# Patient Record
Sex: Female | Born: 1955 | Race: White | Hispanic: No | Smoking: Current every day smoker
Health system: Southern US, Community
[De-identification: ages and names within clinical notes are randomized; demographics above are authoritative.]

## PROBLEM LIST (undated history)

## (undated) DIAGNOSIS — K219 Gastro-esophageal reflux disease without esophagitis: Secondary | ICD-10-CM

## (undated) DIAGNOSIS — I10 Essential (primary) hypertension: Secondary | ICD-10-CM

## (undated) DIAGNOSIS — F32A Depression, unspecified: Secondary | ICD-10-CM

## (undated) DIAGNOSIS — M069 Rheumatoid arthritis, unspecified: Secondary | ICD-10-CM

## (undated) DIAGNOSIS — F329 Major depressive disorder, single episode, unspecified: Secondary | ICD-10-CM

## (undated) DIAGNOSIS — Z8619 Personal history of other infectious and parasitic diseases: Secondary | ICD-10-CM

## (undated) DIAGNOSIS — M1990 Unspecified osteoarthritis, unspecified site: Secondary | ICD-10-CM

## (undated) DIAGNOSIS — K7460 Unspecified cirrhosis of liver: Secondary | ICD-10-CM

## (undated) HISTORY — DX: Unspecified cirrhosis of liver: K74.60

## (undated) HISTORY — PX: REDUCTION MAMMAPLASTY: SUR839

## (undated) HISTORY — PX: BREAST REDUCTION SURGERY: SHX8

## (undated) HISTORY — DX: Personal history of other infectious and parasitic diseases: Z86.19

---

## 2003-12-11 ENCOUNTER — Emergency Department (HOSPITAL_COMMUNITY): Admission: EM | Admit: 2003-12-11 | Discharge: 2003-12-11 | Payer: Self-pay | Admitting: *Deleted

## 2003-12-12 ENCOUNTER — Emergency Department (HOSPITAL_COMMUNITY): Admission: EM | Admit: 2003-12-12 | Discharge: 2003-12-12 | Payer: Self-pay | Admitting: Emergency Medicine

## 2006-07-04 ENCOUNTER — Emergency Department (HOSPITAL_COMMUNITY): Admission: EM | Admit: 2006-07-04 | Discharge: 2006-07-04 | Payer: Self-pay | Admitting: *Deleted

## 2010-02-02 ENCOUNTER — Encounter (INDEPENDENT_AMBULATORY_CARE_PROVIDER_SITE_OTHER): Payer: Self-pay | Admitting: Family Medicine

## 2010-02-02 ENCOUNTER — Ambulatory Visit: Payer: Self-pay | Admitting: Internal Medicine

## 2010-02-02 LAB — CONVERTED CEMR LAB
Albumin: 4 g/dL (ref 3.5–5.2)
Anti Nuclear Antibody(ANA): NEGATIVE
BUN: 15 mg/dL (ref 6–23)
Basophils Relative: 0 % (ref 0–1)
Calcium: 8.7 mg/dL (ref 8.4–10.5)
Chloride: 105 meq/L (ref 96–112)
Creatinine, Ser: 0.91 mg/dL (ref 0.40–1.20)
Eosinophils Absolute: 0.2 10*3/uL (ref 0.0–0.7)
Eosinophils Relative: 3 % (ref 0–5)
Glucose, Bld: 112 mg/dL — ABNORMAL HIGH (ref 70–99)
HCT: 38 % (ref 36.0–46.0)
Lymphs Abs: 1.5 10*3/uL (ref 0.7–4.0)
MCHC: 31.3 g/dL (ref 30.0–36.0)
MCV: 88.6 fL (ref 78.0–100.0)
Monocytes Relative: 9 % (ref 3–12)
Neutrophils Relative %: 67 % (ref 43–77)
Platelets: 298 10*3/uL (ref 150–400)
Potassium: 4.3 meq/L (ref 3.5–5.3)
RBC: 4.29 M/uL (ref 3.87–5.11)
Rhuematoid fact SerPl-aCnc: 46 intl units/mL — ABNORMAL HIGH (ref 0–20)
TSH: 1.295 microintl units/mL (ref 0.350–4.500)
WBC: 7.3 10*3/uL (ref 4.0–10.5)

## 2010-02-23 ENCOUNTER — Ambulatory Visit: Payer: Self-pay | Admitting: Internal Medicine

## 2010-02-25 ENCOUNTER — Encounter (INDEPENDENT_AMBULATORY_CARE_PROVIDER_SITE_OTHER): Payer: Self-pay | Admitting: Family Medicine

## 2010-02-25 ENCOUNTER — Ambulatory Visit: Payer: Self-pay | Admitting: Internal Medicine

## 2010-02-25 LAB — CONVERTED CEMR LAB
BUN: 16 mg/dL (ref 6–23)
Basophils Absolute: 0 10*3/uL (ref 0.0–0.1)
Cholesterol: 160 mg/dL (ref 0–200)
Eosinophils Relative: 3 % (ref 0–5)
HCT: 39 % (ref 36.0–46.0)
HDL: 40 mg/dL (ref >39)
Lymphocytes Relative: 18 % (ref 12–46)
Neutro Abs: 6.4 10*3/uL (ref 1.7–7.7)
Neutrophils Relative %: 74 % (ref 43–77)
Platelets: 253 10*3/uL (ref 150–400)
Potassium: 4.6 meq/L (ref 3.5–5.3)
RDW: 15.5 % (ref 11.5–15.5)
Triglycerides: 193 mg/dL — ABNORMAL HIGH (ref <150)
VLDL: 39 mg/dL (ref 0–40)
WBC: 8.7 10*3/uL (ref 4.0–10.5)

## 2010-03-04 ENCOUNTER — Ambulatory Visit: Payer: Self-pay | Admitting: Internal Medicine

## 2010-04-03 ENCOUNTER — Ambulatory Visit: Payer: Self-pay | Admitting: Internal Medicine

## 2010-05-07 ENCOUNTER — Encounter (INDEPENDENT_AMBULATORY_CARE_PROVIDER_SITE_OTHER): Payer: Self-pay | Admitting: Family Medicine

## 2010-05-07 ENCOUNTER — Ambulatory Visit: Payer: Self-pay | Admitting: Internal Medicine

## 2010-05-07 LAB — CONVERTED CEMR LAB
Basophils Absolute: 0 10*3/uL (ref 0.0–0.1)
Basophils Relative: 0 % (ref 0–1)
Eosinophils Relative: 2 % (ref 0–5)
HCT: 33 % — ABNORMAL LOW (ref 36.0–46.0)
Hemoglobin: 10.4 g/dL — ABNORMAL LOW (ref 12.0–15.0)
MCHC: 31.5 g/dL (ref 30.0–36.0)
Monocytes Absolute: 0.7 10*3/uL (ref 0.1–1.0)
Neutro Abs: 6.9 10*3/uL (ref 1.7–7.7)
RDW: 15.3 % (ref 11.5–15.5)

## 2010-05-08 ENCOUNTER — Encounter (INDEPENDENT_AMBULATORY_CARE_PROVIDER_SITE_OTHER): Payer: Self-pay | Admitting: Family Medicine

## 2010-06-25 ENCOUNTER — Ambulatory Visit: Payer: Self-pay | Admitting: Internal Medicine

## 2010-07-30 ENCOUNTER — Encounter (INDEPENDENT_AMBULATORY_CARE_PROVIDER_SITE_OTHER): Payer: Self-pay | Admitting: Family Medicine

## 2010-07-30 LAB — CONVERTED CEMR LAB
Basophils Absolute: 0 10*3/uL (ref 0.0–0.1)
Eosinophils Absolute: 0.2 10*3/uL (ref 0.0–0.7)
Eosinophils Relative: 2 % (ref 0–5)
HCT: 36.5 % (ref 36.0–46.0)
MCV: 85.9 fL (ref 78.0–100.0)
Platelets: 360 10*3/uL (ref 150–400)
RDW: 15.2 % (ref 11.5–15.5)

## 2010-08-11 ENCOUNTER — Ambulatory Visit (HOSPITAL_COMMUNITY): Admission: RE | Admit: 2010-08-11 | Discharge: 2010-08-11 | Payer: Self-pay | Admitting: Family Medicine

## 2010-12-23 ENCOUNTER — Other Ambulatory Visit (HOSPITAL_COMMUNITY): Payer: Self-pay | Admitting: Family Medicine

## 2010-12-23 DIAGNOSIS — Z92241 Personal history of systemic steroid therapy: Secondary | ICD-10-CM

## 2010-12-31 ENCOUNTER — Ambulatory Visit (HOSPITAL_COMMUNITY)
Admission: RE | Admit: 2010-12-31 | Discharge: 2010-12-31 | Disposition: A | Discharge status: Home / Self Care | Payer: Self-pay | Source: Ambulatory Visit | Attending: Family Medicine | Admitting: Family Medicine

## 2010-12-31 DIAGNOSIS — Z13820 Encounter for screening for osteoporosis: Secondary | ICD-10-CM | POA: Insufficient documentation

## 2010-12-31 DIAGNOSIS — Z0000 Encounter for general adult medical examination without abnormal findings: Secondary | ICD-10-CM | POA: Insufficient documentation

## 2010-12-31 DIAGNOSIS — Z92241 Personal history of systemic steroid therapy: Secondary | ICD-10-CM

## 2011-05-18 ENCOUNTER — Other Ambulatory Visit (HOSPITAL_COMMUNITY): Payer: Self-pay | Admitting: Family Medicine

## 2011-05-18 ENCOUNTER — Ambulatory Visit (HOSPITAL_COMMUNITY)
Admission: RE | Admit: 2011-05-18 | Discharge: 2011-05-18 | Disposition: A | Discharge status: Home / Self Care | Payer: Self-pay | Source: Ambulatory Visit | Attending: Family Medicine | Admitting: Family Medicine

## 2011-05-18 DIAGNOSIS — F17200 Nicotine dependence, unspecified, uncomplicated: Secondary | ICD-10-CM | POA: Insufficient documentation

## 2011-05-18 DIAGNOSIS — I10 Essential (primary) hypertension: Secondary | ICD-10-CM | POA: Insufficient documentation

## 2011-05-18 DIAGNOSIS — R0602 Shortness of breath: Secondary | ICD-10-CM | POA: Insufficient documentation

## 2011-05-18 DIAGNOSIS — R52 Pain, unspecified: Secondary | ICD-10-CM

## 2012-07-19 ENCOUNTER — Encounter (HOSPITAL_COMMUNITY): Payer: Self-pay

## 2012-07-19 ENCOUNTER — Emergency Department (INDEPENDENT_AMBULATORY_CARE_PROVIDER_SITE_OTHER)
Admission: EM | Admit: 2012-07-19 | Discharge: 2012-07-19 | Disposition: A | Discharge status: Home / Self Care | Payer: Medicare Other | Source: Home / Self Care | Attending: Family Medicine | Admitting: Family Medicine

## 2012-07-19 DIAGNOSIS — G8929 Other chronic pain: Secondary | ICD-10-CM

## 2012-07-19 HISTORY — DX: Major depressive disorder, single episode, unspecified: F32.9

## 2012-07-19 HISTORY — DX: Unspecified osteoarthritis, unspecified site: M19.90

## 2012-07-19 HISTORY — DX: Essential (primary) hypertension: I10

## 2012-07-19 HISTORY — DX: Depression, unspecified: F32.A

## 2012-07-19 HISTORY — DX: Gastro-esophageal reflux disease without esophagitis: K21.9

## 2012-07-19 MED ORDER — TRAMADOL HCL 50 MG PO TABS: 50.0000 mg | ORAL_TABLET | Freq: Three times a day (TID) | ORAL | Status: DC | PRN

## 2012-07-19 NOTE — ED Provider Notes (Signed)
History     CSN: [id number]  Arrival date & time 07/19/12  1209   First MD Initiated Contact with Patient 07/19/12 1210      Chief Complaint  Patient presents with  . Joint Pain    (Consider location/radiation/quality/duration/timing/severity/associated sxs/prior treatment) HPI Comments: 56 year old female with history of hypertension and chronic pain related to arthritis. Here requesting refills on her chronic pain medications. She was a [facility] patient and is used to take Percocet as needed for pain. She also takes methotrexate for her rheumatoid arthritis. She explains to me she got all her medications refilled except Percocet. Has been experiencing usual arthritis pain in her left elbow. States that her pain usually involves both elbows and knees. Denies current swelling or redness. Denies general malaise, fever or chills. She has an appointment with a new provider in [facility] on October 10th and will like pain medications to last until next appointment. Does not take anti-inflammatory medications as per her report NSAID cause her to have a rash.   Past Medical History  Diagnosis Date  . Hypertension   . Arthritis   . GERD (gastroesophageal reflux disease)   . Depression     History reviewed. No pertinent past surgical history.  No family history on file.  History  Substance Use Topics  . Smoking status: Current Every Day Smoker  . Smokeless tobacco: Not on file  . Alcohol Use: Yes    OB History    Grav Para Term Preterm Abortions TAB SAB Ect Mult Living                  Review of Systems  Constitutional: Negative for fever, chills, activity change, appetite change and fatigue.       10 systems reviewed and  pertinent negative and positive symptoms are as per HPI.     Musculoskeletal: Positive for arthralgias. Negative for back pain, joint swelling and gait problem.  Skin: Negative for rash.  All other systems reviewed and are  negative.    Allergies  Review of patient's allergies indicates no known allergies.  Home Medications   Current Outpatient Rx  Name Route Sig Dispense Refill  . ESCITALOPRAM OXALATE PO Oral Take by mouth 2 (two) times daily.    [name] LISINOPRIL PO Oral Take by mouth daily.    [name] METHOTREXATE PO Oral Take by mouth once a week.    . OXYCODONE HCL PO Oral Take by mouth.    [name] PROTONIX PO Oral Take by mouth.    . TRAMADOL HCL 50 MG PO TABS Oral Take 1 tablet (50 mg total) by mouth every 8 (eight) hours as needed for pain. 30 tablet 0    BP 148/86  Pulse 66  Temp 97.9 F (36.6 C) (Oral)  Resp 16  SpO2 98%  Physical Exam  Nursing note and vitals reviewed. Constitutional: She is oriented to person, place, and time. She appears well-developed and well-nourished. No distress.  HENT:  Head: Normocephalic and atraumatic.  Mouth/Throat: Oropharynx is clear and moist.  Eyes: Conjunctivae normal are normal. No scleral icterus.  Neck: Neck supple.  Cardiovascular: Normal heart sounds.   Pulmonary/Chest: Breath sounds normal.  Musculoskeletal:       Left elbow: No swelling or erythema. Diffuse tenderness to palpation. Full range of motion actively and passively.  Lymphadenopathy:    She has no cervical adenopathy.  Neurological: She is alert and oriented to person, place, and time.  Skin: No rash noted.    ED  Course  Procedures (including critical care time)  Labs Reviewed - No data to display No results found.   1. Chronic pain       MDM  Clinically well. Prescribed tramadol #30 tablets to take 3 times a day when necessary until her next appointment.   Sharin Grave, MD 07/20/12 1630

## 2012-07-19 NOTE — ED Notes (Signed)
C/o pain in lt elbow for last several days.  Denies injury, states she has arthritis and is out of her percocet- [facility] pt.  Has been applying heat.

## 2012-07-21 ENCOUNTER — Encounter (HOSPITAL_COMMUNITY): Payer: Self-pay

## 2012-07-21 ENCOUNTER — Emergency Department (HOSPITAL_COMMUNITY)
Admission: EM | Admit: 2012-07-21 | Discharge: 2012-07-21 | Disposition: A | Discharge status: Home / Self Care | Payer: Medicare Other | Attending: Emergency Medicine | Admitting: Emergency Medicine

## 2012-07-21 DIAGNOSIS — F17200 Nicotine dependence, unspecified, uncomplicated: Secondary | ICD-10-CM | POA: Insufficient documentation

## 2012-07-21 DIAGNOSIS — F329 Major depressive disorder, single episode, unspecified: Secondary | ICD-10-CM | POA: Insufficient documentation

## 2012-07-21 DIAGNOSIS — K219 Gastro-esophageal reflux disease without esophagitis: Secondary | ICD-10-CM | POA: Insufficient documentation

## 2012-07-21 DIAGNOSIS — M069 Rheumatoid arthritis, unspecified: Secondary | ICD-10-CM | POA: Insufficient documentation

## 2012-07-21 DIAGNOSIS — Z760 Encounter for issue of repeat prescription: Secondary | ICD-10-CM | POA: Insufficient documentation

## 2012-07-21 DIAGNOSIS — F3289 Other specified depressive episodes: Secondary | ICD-10-CM | POA: Insufficient documentation

## 2012-07-21 DIAGNOSIS — I10 Essential (primary) hypertension: Secondary | ICD-10-CM | POA: Insufficient documentation

## 2012-07-21 HISTORY — DX: Rheumatoid arthritis, unspecified: M06.9

## 2012-07-21 MED ORDER — OXYCODONE-ACETAMINOPHEN 5-325 MG PO TABS: 1.0000 | ORAL_TABLET | Freq: Four times a day (QID) | ORAL | Status: DC | PRN

## 2012-07-21 NOTE — ED Notes (Signed)
Complains of hx  Of RA and today has joint pain all over.

## 2012-07-21 NOTE — ED Provider Notes (Signed)
History  This chart was scribed for [employer]. Rubin Payor, MD by Shari Heritage. The patient was seen in room TR10C/TR10C. Patient's care was started at 1244.     CSN: [id number]  Arrival date & time 07/21/12  1240   First MD Initiated Contact with Patient 07/21/12 1244      Chief Complaint  Patient presents with  . Joint Pain    The history is provided by the patient. No language interpreter was used.    Erika [name] is a 56 y.o. female with a history of rheumatoid arthritis who presents to the Emergency Department complaining of moderate to severe, constant, non-radiating elbow and knee joint pain. Patient describes the pain as "sticking pins." Patient denies fever, cough or SOB. She is a former [facility] patient and has run out of her pain medications. She is not seeking any other treatment and states that she does like getting steroid shots. She has an appointment with a new PMD at [facility] on October 11. Patient has a history of HTN, GERD, depression and arthritis. She is a current every day smoker.  Past Medical History  Diagnosis Date  . Hypertension   . Arthritis   . GERD (gastroesophageal reflux disease)   . Depression     No past surgical history on file.  No family history on file.  History  Substance Use Topics  . Smoking status: Current Every Day Smoker  . Smokeless tobacco: Not on file  . Alcohol Use: Yes    OB History    Grav Para Term Preterm Abortions TAB SAB Ect Mult Living                  Review of Systems  Constitutional: Negative for fever.  Respiratory: Negative for cough and shortness of breath.   Musculoskeletal: Positive for arthralgias.    Allergies  Review of patient's allergies indicates no known allergies.  Home Medications   Current Outpatient Rx  Name Route Sig Dispense Refill  . CALCIUM CARBONATE-VITAMIN D 500-200 MG-UNIT PO TABS Oral Take 1 tablet by mouth daily.    [name] ESCITALOPRAM OXALATE 10 MG PO TABS Oral Take 10 mg by  mouth daily.    [name] LISINOPRIL 10 MG PO TABS Oral Take 10 mg by mouth daily.    [name] MIRTAZAPINE 15 MG PO TABS Oral Take 15 mg by mouth at bedtime.    . OXYCODONE-ACETAMINOPHEN 5-325 MG PO TABS Oral Take 1 tablet by mouth every 4 (four) hours as needed.    [name] PANTOPRAZOLE SODIUM 40 MG PO TBEC Oral Take 40 mg by mouth daily.    . OXYCODONE-ACETAMINOPHEN 5-325 MG PO TABS Oral Take 1-2 tablets by mouth every 6 (six) hours as needed for pain. 20 tablet 0    BP 151/97  Pulse 75  Temp 98 F (36.7 C) (Oral)  Resp 18  SpO2 98%  Physical Exam  Constitutional: She is oriented to person, place, and time. She appears well-developed and well-nourished.  HENT:  Head: Normocephalic and atraumatic.  Cardiovascular: Normal rate and regular rhythm.   Pulmonary/Chest: Effort normal and breath sounds normal.  Musculoskeletal: She exhibits tenderness.       Mild tenderness to left elbow.  Neurological: She is alert and oriented to person, place, and time.  Skin: Skin is warm and dry.  Psychiatric: She has a normal mood and affect. Her behavior is normal.    ED Course  Procedures (including critical care time) COORDINATION OF CARE: 1:00pm- Patient informed of  current plan for treatment and evaluation and agrees with plan at this time.      Labs Reviewed - No data to display No results found.   1. Medication refill   2. Rheumatoid arthritis       MDM  Patient is here for medication refill. She states she is out of her Percocet. She is on for her rheumatoid arthritis. [facility] to [facility], which is closed. She states she still has her other medications. She states she has arranged followup but cannot see them to October. She be discharged home with some pain medications      I personally performed the services described in this documentation, which was scribed in my presence. The recorded information has been reviewed and considered.     Juliet Rude. Rubin Payor, MD 07/21/12 1315

## 2012-10-12 ENCOUNTER — Encounter (HOSPITAL_COMMUNITY): Payer: Self-pay

## 2012-10-12 ENCOUNTER — Emergency Department (INDEPENDENT_AMBULATORY_CARE_PROVIDER_SITE_OTHER)
Admission: EM | Admit: 2012-10-12 | Discharge: 2012-10-12 | Disposition: A | Discharge status: Home / Self Care | Payer: Medicare Other | Source: Home / Self Care

## 2012-10-12 DIAGNOSIS — I10 Essential (primary) hypertension: Secondary | ICD-10-CM

## 2012-10-12 DIAGNOSIS — M069 Rheumatoid arthritis, unspecified: Secondary | ICD-10-CM

## 2012-10-12 DIAGNOSIS — F32A Depression, unspecified: Secondary | ICD-10-CM

## 2012-10-12 DIAGNOSIS — F329 Major depressive disorder, single episode, unspecified: Secondary | ICD-10-CM

## 2012-10-12 MED ORDER — OXYCODONE-ACETAMINOPHEN 5-325 MG PO TABS: 1.0000 | ORAL_TABLET | Freq: Three times a day (TID) | ORAL | Status: DC | PRN

## 2012-10-12 NOTE — ED Notes (Signed)
Patient states has a history of rheumatoid arthritis and is in pain- in hands knees and shoulder

## 2012-10-12 NOTE — ED Provider Notes (Signed)
History     CSN: [id number]  Arrival date & time 10/12/12  1244   None     Chief Complaint  Patient presents with  . Joint Pain     HPI 56 year old female with rheumatoid arthritis, hypertension, GERD and depression presents with pain over her left wrist and left shoulder which she feels is typical for her arthritis flare. She needs a refill for her Percocet . She denies any fever or chills. Denies any chest pain, palpitations, shortness of breath, abdominal pain, nausea or vomiting. Denies any weakness. She still continues to smoke and has not been able to quit.  Past Medical History  Diagnosis Date  . Hypertension   . Arthritis   . GERD (gastroesophageal reflux disease)   . Depression   . Rheumatoid arthritis     History reviewed. No pertinent past surgical history.  No family history on file.  History  Substance Use Topics  . Smoking status: Current Every Day Smoker  . Smokeless tobacco: Not on file  . Alcohol Use: Yes    OB History    Grav Para Term Preterm Abortions TAB SAB Ect Mult Living                  Review of Systems As outlined in history of present illness Allergies  Review of patient's allergies indicates no known allergies.  Home Medications   Current Outpatient Rx  Name  Route  Sig  Dispense  Refill  . ESCITALOPRAM OXALATE 10 MG PO TABS   Oral   Take 10 mg by mouth daily.         [name] LISINOPRIL 10 MG PO TABS   Oral   Take 10 mg by mouth daily.         [name] MIRTAZAPINE 15 MG PO TABS   Oral   Take 15 mg by mouth at bedtime.         [name] PANTOPRAZOLE SODIUM 40 MG PO TBEC   Oral   Take 40 mg by mouth daily.         [name] CALCIUM CARBONATE-VITAMIN D 500-200 MG-UNIT PO TABS   Oral   Take 1 tablet by mouth daily.         . OXYCODONE-ACETAMINOPHEN 5-325 MG PO TABS   Oral   Take 1 tablet by mouth every 4 (four) hours as needed.         . OXYCODONE-ACETAMINOPHEN 5-325 MG PO TABS   Oral   Take 1 tablet by mouth every 8 (eight) hours  as needed for pain.   30 tablet   0     BP 100/66  Pulse 60  Temp 98.5 F (36.9 C) (Oral)  Resp 20  SpO2 99%  Physical Exam Middle aged female in no acute distress HEENT: No pallor, moist oral mucosa Chest: Clear to auscultation bilaterally CVS: Normal S1 and S2 no murmurs Abdomen: Soft, nontender, nondistended Extremities: warm, no edema tenderness over left wrist and left shoulder with no limited range of motion. CNS: AAO x3 ED Course  Procedures (including critical care time)  Labs Reviewed - No data to display No results found.   1. Rheumatoid arthritis   2. Hypertension   3. Depression     #1 rheumatoid arthritis Refill a prescription for Percocet. She has appointment with the hematologist at [facility] on January 13 and will be following over there.  Hypertension :continue with lisinopril for now. Depression :continue mirtazapine climb GERD continue Protonix.  Health maintenance counseled on  smoking sedation.   MDM  Follow up as        Eddie North, MD 10/12/12 1335

## 2012-11-05 ENCOUNTER — Emergency Department (HOSPITAL_COMMUNITY)
Admission: EM | Admit: 2012-11-05 | Discharge: 2012-11-05 | Disposition: A | Discharge status: Home / Self Care | Payer: Medicare Other | Attending: Emergency Medicine | Admitting: Emergency Medicine

## 2012-11-05 ENCOUNTER — Encounter (HOSPITAL_COMMUNITY): Payer: Self-pay | Admitting: Adult Health

## 2012-11-05 DIAGNOSIS — Z79899 Other long term (current) drug therapy: Secondary | ICD-10-CM | POA: Insufficient documentation

## 2012-11-05 DIAGNOSIS — M069 Rheumatoid arthritis, unspecified: Secondary | ICD-10-CM | POA: Insufficient documentation

## 2012-11-05 DIAGNOSIS — F329 Major depressive disorder, single episode, unspecified: Secondary | ICD-10-CM | POA: Insufficient documentation

## 2012-11-05 DIAGNOSIS — F3289 Other specified depressive episodes: Secondary | ICD-10-CM | POA: Insufficient documentation

## 2012-11-05 DIAGNOSIS — Y929 Unspecified place or not applicable: Secondary | ICD-10-CM | POA: Insufficient documentation

## 2012-11-05 DIAGNOSIS — F17200 Nicotine dependence, unspecified, uncomplicated: Secondary | ICD-10-CM | POA: Insufficient documentation

## 2012-11-05 DIAGNOSIS — Z8739 Personal history of other diseases of the musculoskeletal system and connective tissue: Secondary | ICD-10-CM | POA: Insufficient documentation

## 2012-11-05 DIAGNOSIS — W010XXA Fall on same level from slipping, tripping and stumbling without subsequent striking against object, initial encounter: Secondary | ICD-10-CM | POA: Insufficient documentation

## 2012-11-05 DIAGNOSIS — W19XXXA Unspecified fall, initial encounter: Secondary | ICD-10-CM

## 2012-11-05 DIAGNOSIS — K219 Gastro-esophageal reflux disease without esophagitis: Secondary | ICD-10-CM | POA: Insufficient documentation

## 2012-11-05 DIAGNOSIS — S0100XA Unspecified open wound of scalp, initial encounter: Secondary | ICD-10-CM | POA: Insufficient documentation

## 2012-11-05 DIAGNOSIS — S0101XA Laceration without foreign body of scalp, initial encounter: Secondary | ICD-10-CM

## 2012-11-05 DIAGNOSIS — I10 Essential (primary) hypertension: Secondary | ICD-10-CM | POA: Insufficient documentation

## 2012-11-05 DIAGNOSIS — Y939 Activity, unspecified: Secondary | ICD-10-CM | POA: Insufficient documentation

## 2012-11-05 MED ORDER — OXYCODONE-ACETAMINOPHEN 5-325 MG PO TABS: 2.0000 | ORAL_TABLET | Freq: Four times a day (QID) | ORAL | Status: DC | PRN

## 2012-11-05 NOTE — ED Provider Notes (Signed)
History   This chart was scribed for Erika Skene, MD by Melba Coon, ED Scribe. The patient was seen in room TR10C/TR10C and the patient's care was started at 6:29PM.    CSN: [id number]  Arrival date & time 11/05/12  1717   None     Chief Complaint  Patient presents with  . Head Laceration    (Consider location/radiation/quality/duration/timing/severity/associated sxs/prior treatment) The history is provided by the patient. No language interpreter was used.   Erika [name] is a 57 y.o. female who presents to the Emergency Department complaining of constant, moderate to severe left lateral headache pertaining to a laceration from a fall with no LOC with an onset 3 hours ago. She reports she tripped and fell on her son's TV stand. She also complains of mild right knee pain, but she has no other complaints from the fall. She took percocet which slightly alleviated the pain. Denies dizziness, lightheadedness fever, neck pain, sore throat, rash, back pain, CP, SOB, abdominal pain, nausea, emesis, diarrhea, dysuria, or extremity pain, edema, weakness, numbness, or tingling. Hx of arthritis to the hands, knees, and toes. No known allergies. No other pertinent medical symptoms.  Past Medical History  Diagnosis Date  . Hypertension   . Arthritis   . GERD (gastroesophageal reflux disease)   . Depression   . Rheumatoid arthritis     History reviewed. No pertinent past surgical history.  History reviewed. No pertinent family history.  History  Substance Use Topics  . Smoking status: Current Every Day Smoker  . Smokeless tobacco: Not on file  . Alcohol Use: Yes    OB History    Grav Para Term Preterm Abortions TAB SAB Ect Mult Living                  Review of Systems 10 Systems reviewed and all are negative for acute change except as noted in the HPI.   Allergies  Review of patient's allergies indicates no known allergies.  Home Medications   Current Outpatient  Rx  Name  Route  Sig  Dispense  Refill  . CALCIUM CARBONATE-VITAMIN D 500-200 MG-UNIT PO TABS   Oral   Take 1 tablet by mouth daily.         [name] ESCITALOPRAM OXALATE 10 MG PO TABS   Oral   Take 10 mg by mouth daily.         [name] LISINOPRIL 10 MG PO TABS   Oral   Take 10 mg by mouth daily.         [name] MIRTAZAPINE 15 MG PO TABS   Oral   Take 15 mg by mouth at bedtime.         . OXYCODONE-ACETAMINOPHEN 5-325 MG PO TABS   Oral   Take 1 tablet by mouth every 4 (four) hours as needed. For pain         . PANTOPRAZOLE SODIUM 40 MG PO TBEC   Oral   Take 40 mg by mouth daily.           BP 132/78  Pulse 78  Temp 98.1 F (36.7 C) (Oral)  Resp 22  SpO2 97%  Physical Exam  HENT:  Head:      Nursing notes reviewed.  Electronic medical record reviewed. VITAL SIGNS:   Filed Vitals:   11/05/12 1722  BP: 132/78  Pulse: 78  Temp: 98.1 F (36.7 C)  TempSrc: Oral  Resp: 22  SpO2: 97%   CONSTITUTIONAL: Awake, oriented, appears  non-toxic HENT: 3cm C-shaped scalp laceration on left temporal scalp, galea lacerated with maceration of the galea edges, some macerated temporalis muscle normocephalic, oral mucosa pink and moist, airway patent. Nares patent without drainage. External ears normal. EYES: Conjunctiva clear, EOMI, PERRLA NECK: Trachea midline, non-tender, supple CARDIOVASCULAR: Normal heart rate, Normal rhythm, No murmurs, rubs, gallops PULMONARY/CHEST: Clear to auscultation, no rhonchi, wheezes, or rales. Symmetrical breath sounds. Non-tender. ABDOMINAL: Non-distended, soft, non-tender - no rebound or guarding.  BS normal. NEUROLOGIC: Non-focal, moving all four extremities, no gross sensory or motor deficits. EXTREMITIES: No clubbing, cyanosis, or edema. Dime sized contusion over right kneecap SKIN: Warm, Dry, No erythema, No rash   ED Course  Procedures (including critical care time)  COORDINATION OF CARE:  6:34PM - laceration repair will be performed for  [name].   LACERATION REPAIR Performed by: Erika Skene, MD Consent: Verbal consent obtained. Risks and benefits: risks, benefits and alternatives were discussed Patient identity confirmed: provided demographic data Time out performed prior to procedure Prepped and Draped in normal sterile fashion Wound explored Laceration Location: left temporal region Laceration Length: 3cm No Foreign Bodies seen or palpated Anesthesia: local infiltration Local anesthetic: lidocaine 2% w/ epinephrine Anesthetic total: 5 ml Irrigation method: syringe Amount of cleaning: standard Skin closure: staples + 4-0 vicryl closure of galea; complex closure Number of sutures or staples: 11 Technique: interrupted Patient tolerance: Patient tolerated the procedure well with no immediate complications.   Labs Reviewed - No data to display No results found.   1. Scalp laceration   2. Fall       MDM  Erika [name] is a 57 y.o. female presenting with mechanical fall and scalp laceration - see note for  Procedure.  Neurologically intact - do not think imaging is indicated. Skull visualized, wound hemostatic - no fracture seen.  Pain is localized to bruising in soft tissue at site. I explained the diagnosis and have given explicit precautions to return to the ER including signs of infection or any other new or worsening symptoms. The patient understands and accepts the medical plan as it's been dictated and I have answered their questions. Discharge instructions concerning home care and prescriptions have been given.  The patient is STABLE and is discharged to home in good condition.   I personally performed the services described in this documentation, which was scribed in my presence. The recorded information has been reviewed and is accurate.         Erika Skene, MD 11/06/12 1415

## 2012-11-05 NOTE — ED Notes (Addendum)
Pt tripped and fell into a t.v. Stand at 1630, denies LOC, denies blurred vision, denies nausea. Approximately 1 inch laceration to left side of head. Bleeding controlled. Alert and oriented, PERRLA.  Denies neck pain, denies blood thinners.

## 2012-11-15 ENCOUNTER — Emergency Department (HOSPITAL_COMMUNITY)
Admission: EM | Admit: 2012-11-15 | Discharge: 2012-11-15 | Disposition: A | Discharge status: Home / Self Care | Payer: Medicare Other | Attending: Emergency Medicine | Admitting: Emergency Medicine

## 2012-11-15 ENCOUNTER — Encounter (HOSPITAL_COMMUNITY): Payer: Self-pay | Admitting: Emergency Medicine

## 2012-11-15 DIAGNOSIS — F329 Major depressive disorder, single episode, unspecified: Secondary | ICD-10-CM | POA: Insufficient documentation

## 2012-11-15 DIAGNOSIS — Z4802 Encounter for removal of sutures: Secondary | ICD-10-CM | POA: Insufficient documentation

## 2012-11-15 DIAGNOSIS — I10 Essential (primary) hypertension: Secondary | ICD-10-CM | POA: Insufficient documentation

## 2012-11-15 DIAGNOSIS — Z8739 Personal history of other diseases of the musculoskeletal system and connective tissue: Secondary | ICD-10-CM | POA: Insufficient documentation

## 2012-11-15 DIAGNOSIS — F17200 Nicotine dependence, unspecified, uncomplicated: Secondary | ICD-10-CM | POA: Insufficient documentation

## 2012-11-15 DIAGNOSIS — Z79899 Other long term (current) drug therapy: Secondary | ICD-10-CM | POA: Insufficient documentation

## 2012-11-15 DIAGNOSIS — F3289 Other specified depressive episodes: Secondary | ICD-10-CM | POA: Insufficient documentation

## 2012-11-15 DIAGNOSIS — K219 Gastro-esophageal reflux disease without esophagitis: Secondary | ICD-10-CM | POA: Insufficient documentation

## 2012-11-15 MED ORDER — CYCLOBENZAPRINE HCL 10 MG PO TABS: 10.0000 mg | ORAL_TABLET | Freq: Two times a day (BID) | ORAL | Status: DC | PRN

## 2012-11-15 NOTE — ED Provider Notes (Signed)
Medical screening examination/treatment/procedure(s) were performed by non-physician practitioner and as supervising physician I was immediately available for consultation/collaboration.   Glynn Octave, MD 11/15/12 1438

## 2012-11-15 NOTE — ED Notes (Signed)
Pt here for staple removal to left side of head sts had 6 staples placed

## 2012-11-15 NOTE — ED Provider Notes (Signed)
History     CSN: [id number]  Arrival date & time 11/15/12  1225   First MD Initiated Contact with Patient 11/15/12 1235      Chief Complaint  Patient presents with  . Suture / Staple Removal    (Consider location/radiation/quality/duration/timing/severity/associated sxs/prior treatment) HPI Comments: Patient presents for suture removal. The laceration is located on left scalp. The patient reports subjective healing of the laceration and denies any complications or concerns. The patient denies any pain, erythema, tenderness, drainage of the site. Denies fever, NVD, abdominal pain, numbness/tingling, skin color changes.     Past Medical History  Diagnosis Date  . Hypertension   . Arthritis   . GERD (gastroesophageal reflux disease)   . Depression   . Rheumatoid arthritis     History reviewed. No pertinent past surgical history.  History reviewed. No pertinent family history.  History  Substance Use Topics  . Smoking status: Current Every Day Smoker  . Smokeless tobacco: Not on file  . Alcohol Use: Yes    OB History    Grav Para Term Preterm Abortions TAB SAB Ect Mult Living                  Review of Systems  Skin: Positive for wound.  All other systems reviewed and are negative.    Allergies  Review of patient's allergies indicates no known allergies.  Home Medications   Current Outpatient Rx  Name  Route  Sig  Dispense  Refill  . CALCIUM CARBONATE-VITAMIN D 500-200 MG-UNIT PO TABS   Oral   Take 1 tablet by mouth daily.         [name] ESCITALOPRAM OXALATE 20 MG PO TABS   Oral   Take 20 mg by mouth daily.         [name] LISINOPRIL 10 MG PO TABS   Oral   Take 10 mg by mouth daily.         [name] METHOTREXATE (ANTI-RHEUMATIC) 2.5 MG PO TABS   Oral   Take 17.5 mg by mouth every 7 (seven) days. On Thursdays         . MIRTAZAPINE 15 MG PO TABS   Oral   Take 15 mg by mouth at bedtime.         . OXYCODONE-ACETAMINOPHEN 5-325 MG PO TABS   Oral   Take 1  tablet by mouth every 4 (four) hours as needed. For pain         . PANTOPRAZOLE SODIUM 40 MG PO TBEC   Oral   Take 40 mg by mouth daily.           BP 130/104  Pulse 85  Temp 97.5 F (36.4 C) (Oral)  Resp 18  SpO2 99%  Physical Exam  Nursing note and vitals reviewed. Constitutional: She is oriented to person, place, and time. She appears well-developed and well-nourished. No distress.  HENT:  Head: Normocephalic. Head is with laceration.         3 cm well healed laceration to left temporal scalp with 6 staples intact.   Eyes: Conjunctivae normal and EOM are normal.  Neck: Normal range of motion. Neck supple.  Cardiovascular: Normal rate and regular rhythm.  Exam reveals no gallop and no friction rub.   No murmur heard. Pulmonary/Chest: Effort normal and breath sounds normal. She has no wheezes. She has no rales. She exhibits no tenderness.  Abdominal: Soft. She exhibits no distension.  Musculoskeletal: Normal range of motion.  Neurological: She is  alert and oriented to person, place, and time. Coordination normal.       Speech is goal-oriented. Moves limbs without ataxia.   Skin: Skin is warm and dry.  Psychiatric: She has a normal mood and affect. Her behavior is normal.    ED Course  Procedures (including critical care time)  SUTURE REMOVAL Performed by: Emilia Beck  Consent: Verbal consent obtained. Patient identity confirmed: provided demographic data Time out: Immediately prior to procedure a "time out" was called to verify the correct patient, procedure, equipment, support staff and site/side marked as required.  Location details: left temporal scalp  Wound Appearance: clean  Sutures/Staples Removed: 6 staples  Facility: sutures placed in this facility Patient tolerance: Patient tolerated the procedure well with no immediate complications.     Labs Reviewed - No data to display No results found.   1. Removal of staples       MDM    12:41 PM Staples removed without complication. Patient will have flexeril for continued neck and back pain from her fall. No further evaluation needed at this time.         Emilia Beck, PA-C 11/15/12 1247

## 2012-11-21 ENCOUNTER — Encounter (HOSPITAL_COMMUNITY): Payer: Self-pay

## 2012-11-21 ENCOUNTER — Emergency Department (INDEPENDENT_AMBULATORY_CARE_PROVIDER_SITE_OTHER)
Admission: EM | Admit: 2012-11-21 | Discharge: 2012-11-21 | Disposition: A | Discharge status: Home / Self Care | Payer: Medicare Other | Source: Home / Self Care

## 2012-11-21 DIAGNOSIS — G4700 Insomnia, unspecified: Secondary | ICD-10-CM

## 2012-11-21 DIAGNOSIS — G8929 Other chronic pain: Secondary | ICD-10-CM

## 2012-11-21 DIAGNOSIS — F329 Major depressive disorder, single episode, unspecified: Secondary | ICD-10-CM

## 2012-11-21 DIAGNOSIS — M069 Rheumatoid arthritis, unspecified: Secondary | ICD-10-CM

## 2012-11-21 DIAGNOSIS — I10 Essential (primary) hypertension: Secondary | ICD-10-CM

## 2012-11-21 MED ORDER — LISINOPRIL 10 MG PO TABS: 10.0000 mg | ORAL_TABLET | Freq: Every day | ORAL | Status: DC

## 2012-11-21 MED ORDER — PANTOPRAZOLE SODIUM 40 MG PO TBEC: 40.0000 mg | DELAYED_RELEASE_TABLET | Freq: Every day | ORAL | Status: DC

## 2012-11-21 MED ORDER — MIRTAZAPINE 15 MG PO TABS: 15.0000 mg | ORAL_TABLET | Freq: Every day | ORAL | Status: DC

## 2012-11-21 MED ORDER — OXYCODONE-ACETAMINOPHEN 5-325 MG PO TABS: 1.0000 | ORAL_TABLET | Freq: Four times a day (QID) | ORAL | Status: DC | PRN

## 2012-11-21 MED ORDER — ESCITALOPRAM OXALATE 20 MG PO TABS: 20.0000 mg | ORAL_TABLET | Freq: Every day | ORAL | Status: DC

## 2012-11-21 NOTE — ED Notes (Signed)
Patient here for medication refill-history of hypertension, arthritis

## 2012-11-21 NOTE — ED Provider Notes (Signed)
History     CSN: [id number]  Arrival date & time 11/21/12  1517  Chief Complaint  Patient presents with  . Medication Refill   HPI Pt says that she is out of her regular medications.  She would like to have them refilled today.  She denies any particular complaints today.  She does take chronic pain medications for rheumatoid arthritis.  She reports that she is able to function when she is taking the pain medications.  Past Medical History  Diagnosis Date  . Hypertension   . Arthritis   . GERD (gastroesophageal reflux disease)   . Depression   . Rheumatoid arthritis     History reviewed. No pertinent past surgical history.  No family history on file.  History  Substance Use Topics  . Smoking status: Current Every Day Smoker  . Smokeless tobacco: Not on file  . Alcohol Use: Yes   OB History    Grav Para Term Preterm Abortions TAB SAB Ect Mult Living                 Review of Systems  HENT: Negative.   Respiratory: Negative.   Cardiovascular: Negative.   Gastrointestinal: Negative.   Musculoskeletal: Positive for joint swelling and arthralgias.  Neurological: Negative.   Hematological: Negative.   Psychiatric/Behavioral: Negative.   All other systems reviewed and are negative.    Allergies  Review of patient's allergies indicates no known allergies.  Home Medications   Current Outpatient Rx  Name  Route  Sig  Dispense  Refill  . CALCIUM CARBONATE-VITAMIN D 500-200 MG-UNIT PO TABS   Oral   Take 1 tablet by mouth daily.         . CYCLOBENZAPRINE HCL 10 MG PO TABS   Oral   Take 1 tablet (10 mg total) by mouth 2 (two) times daily as needed for muscle spasms.   20 tablet   0   . ESCITALOPRAM OXALATE 20 MG PO TABS   Oral   Take 20 mg by mouth daily.         [name] LISINOPRIL 10 MG PO TABS   Oral   Take 10 mg by mouth daily.         [name] METHOTREXATE (ANTI-RHEUMATIC) 2.5 MG PO TABS   Oral   Take 17.5 mg by mouth every 7 (seven) days. On Thursdays         . MIRTAZAPINE 15 MG PO TABS   Oral   Take 15 mg by mouth at bedtime.         . OXYCODONE-ACETAMINOPHEN 5-325 MG PO TABS   Oral   Take 1 tablet by mouth every 4 (four) hours as needed. For pain         . PANTOPRAZOLE SODIUM 40 MG PO TBEC   Oral   Take 40 mg by mouth daily.           BP 110/56  Pulse 62  Temp 97.7 F (36.5 C) (Oral)  Resp 18  SpO2 98%  Physical Exam  Nursing note and vitals reviewed. Constitutional: She is oriented to person, place, and time. She appears well-developed and well-nourished. No distress.  HENT:  Head: Normocephalic and atraumatic.  Eyes: EOM are normal. Pupils are equal, round, and reactive to light.  Neck: Normal range of motion. Neck supple.  Cardiovascular: Normal rate, regular rhythm and normal heart sounds.   Pulmonary/Chest: Effort normal and breath sounds normal.  Abdominal: Soft. Bowel sounds are normal.  Musculoskeletal: She exhibits  edema and tenderness.  Neurological: She is alert and oriented to person, place, and time.  Skin: Skin is warm and dry.  Psychiatric: She has a normal mood and affect. Her behavior is normal. Judgment and thought content normal.    ED Course  Procedures (including critical care time)  Labs Reviewed - No data to display No results found.   No diagnosis found.    MDM  IMPRESSION  Chronic pain  Hypertension  Rheumatoid Arthritis  RECOMMENDATIONS / PLAN Pt says that she is trying to establish primary care with another physician Dr. Bascom Levels.  Pt did not bring her medical records today.    Refilled her chronic medications today.    FOLLOW UP 3 months if not established with her own private PCP by then.   The patient was given clear instructions to go to ER or return to medical center if symptoms don't improve, worsen or new problems develop.  The patient verbalized understanding.  The patient was told to call to get lab results if they haven't heard anything in the next week.            Cleora Fleet, MD 11/21/12 1935

## 2012-12-25 ENCOUNTER — Encounter (HOSPITAL_COMMUNITY): Payer: Self-pay

## 2012-12-25 ENCOUNTER — Emergency Department (INDEPENDENT_AMBULATORY_CARE_PROVIDER_SITE_OTHER)
Admission: EM | Admit: 2012-12-25 | Discharge: 2012-12-25 | Disposition: A | Discharge status: Home / Self Care | Payer: Medicare Other | Source: Home / Self Care

## 2012-12-25 ENCOUNTER — Emergency Department (HOSPITAL_COMMUNITY)
Admission: EM | Admit: 2012-12-25 | Discharge: 2012-12-25 | Discharge status: Absent without leave | Payer: Self-pay | Source: Home / Self Care

## 2012-12-25 DIAGNOSIS — M069 Rheumatoid arthritis, unspecified: Secondary | ICD-10-CM

## 2012-12-25 MED ORDER — IBUPROFEN 200 MG PO TABS: 400.0000 mg | ORAL_TABLET | Freq: Four times a day (QID) | ORAL | Status: DC | PRN

## 2012-12-25 NOTE — ED Provider Notes (Addendum)
History     CSN: [id number]  Arrival date & time 12/25/12  1302   First MD Initiated Contact with Patient 12/25/12 1315      Chief Complaint  Patient presents with  . Medication Refill    (Consider location/radiation/quality/duration/timing/severity/associated sxs/prior treatment) HPI  Past Medical History  Diagnosis Date  . Hypertension   . Arthritis   . GERD (gastroesophageal reflux disease)   . Depression   . Rheumatoid arthritis     History reviewed. No pertinent past surgical history.  No family history on file.  History  Substance Use Topics  . Smoking status: Current Every Day Smoker  . Smokeless tobacco: Not on file  . Alcohol Use: Yes    OB History   Grav Para Term Preterm Abortions TAB SAB Ect Mult Living                  Review of Systems  Allergies  Review of patient's allergies indicates no known allergies.  Home Medications   Current Outpatient Rx  Name  Route  Sig  Dispense  Refill  . calcium-vitamin D (OSCAL WITH D) 500-200 MG-UNIT per tablet   Oral   Take 1 tablet by mouth daily.         [name] escitalopram (LEXAPRO) 20 MG tablet   Oral   Take 1 tablet (20 mg total) by mouth daily.   30 tablet   2   . ibuprofen (ADVIL) 200 MG tablet   Oral   Take 2 tablets (400 mg total) by mouth every 6 (six) hours as needed for pain.   90 tablet   1   . lisinopril (PRINIVIL,ZESTRIL) 10 MG tablet   Oral   Take 1 tablet (10 mg total) by mouth daily.   30 tablet   2   . methotrexate (RHEUMATREX) 2.5 MG tablet   Oral   Take 17.5 mg by mouth every 7 (seven) days. On Thursdays         . mirtazapine (REMERON) 15 MG tablet   Oral   Take 1 tablet (15 mg total) by mouth at bedtime.   30 tablet   2   . oxyCODONE-acetaminophen (PERCOCET/ROXICET) 5-325 MG per tablet   Oral   Take 1 tablet by mouth every 6 (six) hours as needed for pain. For pain   30 tablet   0   . pantoprazole (PROTONIX) 40 MG tablet   Oral   Take 1 tablet (40 mg total)  by mouth daily.   30 tablet   2     BP 124/76  Pulse 66  Temp(Src) 98 F (36.7 C) (Oral)  SpO2 99%  Physical Exam  ED Course  Procedures (including critical care time)  Labs Reviewed - No data to display No results found.   1. Rheumatoid arthritis    Patient requesting for Percocet refill. She was seen by myself in December last year and didn't return back, later to see Dr. Laural Benes and get a refill for Percocet. She was prescribed a short course of Percocet during total visits. Had an appointment at rheumatology clinic in [facility] in January and was started on methotrexate 7.5 mg daily. She informs that she has been on Percocet for almost a year which was prescribed by [facility] clinic previously. I recommended that narcotics would not be the best solution for her rheumatoid arthritis pain and she try different medications such as tramadol. Patient however does not wish to be on abdominal swelling she does not tolerate it  well. I will try a course of NSAIDs and would be more effective for her arthritis. I will give her a prescription of advil. I have counseled her on taking the medicine on full stomach warned 20 over water in order to avoid gastric irritation. I will make a referral for her to the pain clinic if she needs a long-term narcotics.  GERD Continue PPI. Counseled strongly on smoking cessation  Depression Continue lexapro  and Remeron  Hypertension Blood pressure stable Continue lisinopril  MDM  Follow up in 3 months        Orel Hord, MD 12/25/12 1337  Jameria Bradway, MD 12/25/12 1338

## 2012-12-25 NOTE — ED Notes (Signed)
Patient states here for medication refill 

## 2013-03-15 ENCOUNTER — Encounter (HOSPITAL_COMMUNITY): Payer: Self-pay | Admitting: Emergency Medicine

## 2013-03-15 ENCOUNTER — Emergency Department (HOSPITAL_COMMUNITY)
Admission: EM | Admit: 2013-03-15 | Discharge: 2013-03-15 | Disposition: A | Discharge status: Home / Self Care | Payer: Medicare Other | Attending: Emergency Medicine | Admitting: Emergency Medicine

## 2013-03-15 DIAGNOSIS — Z8739 Personal history of other diseases of the musculoskeletal system and connective tissue: Secondary | ICD-10-CM | POA: Insufficient documentation

## 2013-03-15 DIAGNOSIS — I10 Essential (primary) hypertension: Secondary | ICD-10-CM | POA: Insufficient documentation

## 2013-03-15 DIAGNOSIS — F17200 Nicotine dependence, unspecified, uncomplicated: Secondary | ICD-10-CM | POA: Insufficient documentation

## 2013-03-15 DIAGNOSIS — F3289 Other specified depressive episodes: Secondary | ICD-10-CM | POA: Insufficient documentation

## 2013-03-15 DIAGNOSIS — F329 Major depressive disorder, single episode, unspecified: Secondary | ICD-10-CM | POA: Insufficient documentation

## 2013-03-15 DIAGNOSIS — M2550 Pain in unspecified joint: Secondary | ICD-10-CM

## 2013-03-15 DIAGNOSIS — K219 Gastro-esophageal reflux disease without esophagitis: Secondary | ICD-10-CM | POA: Insufficient documentation

## 2013-03-15 DIAGNOSIS — Z79899 Other long term (current) drug therapy: Secondary | ICD-10-CM | POA: Insufficient documentation

## 2013-03-15 MED ORDER — OXYCODONE-ACETAMINOPHEN 5-325 MG PO TABS: 1.0000 | ORAL_TABLET | Freq: Four times a day (QID) | ORAL | Status: DC | PRN

## 2013-03-15 NOTE — ED Notes (Signed)
Pt states she has a hx of rheumatoid arthritis. Pt states she has pain in her left shoulder, elbow, and wrist. Pt states she also pain in her right shoulder. Pt states she sees a specialist every 6 months. Pt states she is out of her pain medication. Pt state she takes IBprofen and Advil for her pain but that it is unaffective.

## 2013-03-15 NOTE — ED Provider Notes (Signed)
History     CSN: [id number]  Arrival date & time 03/15/13  1406   First MD Initiated Contact with Patient 03/15/13 1426      Chief Complaint  Patient presents with  . Rheumatoid Arthritis    (Consider location/radiation/quality/duration/timing/severity/associated sxs/prior treatment) HPI Comments: Patient with a history of Rheumatoid Arthritis presents today with diffuse joint pain.  She reports that she is having the pain "all over" her body.  Pain worse in the knees bilaterally and the right elbow.  She reports that she has had the pain for years, but pain worsened over the past week.  She currently sees a Rheumatolgist for her RA.  However, she was not able to get an appointment for a while.  She reports that her pain today is the pain that she typically has with a RA flare up.  She has been taking both Tylenol and Ibuprofen for the pain, which she no longer feels is helping.  She reports that last week her joints were red and swollen, but that has improved since that time.  She denies fever or chills.  ROM of the joints is at baseline.    The history is provided by the patient.    Past Medical History  Diagnosis Date  . Hypertension   . Arthritis   . GERD (gastroesophageal reflux disease)   . Depression   . Rheumatoid arthritis     Past Surgical History  Procedure Laterality Date  . Breast reduction surgery      No family history on file.  History  Substance Use Topics  . Smoking status: Current Every Day Smoker  . Smokeless tobacco: Not on file  . Alcohol Use: Yes    OB History   Grav Para Term Preterm Abortions TAB SAB Ect Mult Living   2 2 2       2       Review of Systems  Musculoskeletal: Positive for arthralgias.  All other systems reviewed and are negative.    Allergies  Review of patient's allergies indicates no known allergies.  Home Medications   Current Outpatient Rx  Name  Route  Sig  Dispense  Refill  . calcium-vitamin D (OSCAL WITH D)  500-200 MG-UNIT per tablet   Oral   Take 1 tablet by mouth daily.         [name] escitalopram (LEXAPRO) 20 MG tablet   Oral   Take 1 tablet (20 mg total) by mouth daily.   30 tablet   2   . ibuprofen (ADVIL,MOTRIN) 200 MG tablet   Oral   Take 600-800 mg by mouth every 6 (six) hours as needed for pain.         [name] lisinopril (PRINIVIL,ZESTRIL) 10 MG tablet   Oral   Take 1 tablet (10 mg total) by mouth daily.   30 tablet   2   . methotrexate (RHEUMATREX) 2.5 MG tablet   Oral   Take 17.5 mg by mouth every 7 (seven) days. On Thursdays         . mirtazapine (REMERON) 15 MG tablet   Oral   Take 1 tablet (15 mg total) by mouth at bedtime.   30 tablet   2   . oxyCODONE-acetaminophen (PERCOCET/ROXICET) 5-325 MG per tablet   Oral   Take 1 tablet by mouth every 6 (six) hours as needed for pain.         . pantoprazole (PROTONIX) 40 MG tablet   Oral   Take 1 tablet (  40 mg total) by mouth daily.   30 tablet   2     BP 137/116  Pulse 88  Temp(Src) 98.1 F (36.7 C) (Oral)  Resp 18  SpO2 98%  Physical Exam  Nursing note and vitals reviewed. Constitutional: She appears well-developed and well-nourished.  HENT:  Head: Normocephalic and atraumatic.  Neck: Normal range of motion. Neck supple.  Cardiovascular: Normal rate, regular rhythm and normal heart sounds.   Pulmonary/Chest: Effort normal and breath sounds normal.  Musculoskeletal:  ROM slightly decreased at the right elbow.  No erythema, edema, or warmth  Extension slightly decreased at the knees bilaterally.  No erythema, edema, or warmth  Neurological: She is alert.  Skin: Skin is warm and dry. No erythema.  Psychiatric: She has a normal mood and affect.    ED Course  Procedures (including critical care time)  Labs Reviewed - No data to display No results found.   No diagnosis found.  Looked up the patient in the narcotic database.  Last narcotic prescription filled was on 11/21/12.  She had thirty  Percocet filled at that time.  MDM  Patient with a history of RA presents with a chief complaint of joint pain, which she reports is similar to the pain that she has had in the past with RA.  Looked up patient in the narcotic database.  No recent narcotic prescriptions.  Patient discharged home with short course of pain medication and given resource guide.  Patient instructed to follow up with a PCP.        Pascal Lux [place], PA-C 03/15/13 1739

## 2013-03-15 NOTE — ED Provider Notes (Signed)
Medical screening examination/treatment/procedure(s) were performed by non-physician practitioner and as supervising physician I was immediately available for consultation/collaboration.   Jeanene Mena III, MD 03/15/13 1939 

## 2013-07-09 ENCOUNTER — Other Ambulatory Visit: Payer: Self-pay

## 2013-07-09 ENCOUNTER — Encounter (HOSPITAL_COMMUNITY): Payer: Self-pay | Admitting: Cardiology

## 2013-07-09 ENCOUNTER — Emergency Department (HOSPITAL_COMMUNITY)
Admission: EM | Admit: 2013-07-09 | Discharge: 2013-07-09 | Disposition: A | Discharge status: Home / Self Care | Payer: Medicare Other | Attending: Emergency Medicine | Admitting: Emergency Medicine

## 2013-07-09 DIAGNOSIS — Z79899 Other long term (current) drug therapy: Secondary | ICD-10-CM | POA: Insufficient documentation

## 2013-07-09 DIAGNOSIS — M069 Rheumatoid arthritis, unspecified: Secondary | ICD-10-CM | POA: Insufficient documentation

## 2013-07-09 DIAGNOSIS — B028 Zoster with other complications: Secondary | ICD-10-CM

## 2013-07-09 DIAGNOSIS — F329 Major depressive disorder, single episode, unspecified: Secondary | ICD-10-CM | POA: Insufficient documentation

## 2013-07-09 DIAGNOSIS — F3289 Other specified depressive episodes: Secondary | ICD-10-CM | POA: Insufficient documentation

## 2013-07-09 DIAGNOSIS — K219 Gastro-esophageal reflux disease without esophagitis: Secondary | ICD-10-CM | POA: Insufficient documentation

## 2013-07-09 DIAGNOSIS — F17200 Nicotine dependence, unspecified, uncomplicated: Secondary | ICD-10-CM | POA: Insufficient documentation

## 2013-07-09 DIAGNOSIS — M129 Arthropathy, unspecified: Secondary | ICD-10-CM | POA: Insufficient documentation

## 2013-07-09 DIAGNOSIS — B029 Zoster without complications: Secondary | ICD-10-CM | POA: Insufficient documentation

## 2013-07-09 DIAGNOSIS — I10 Essential (primary) hypertension: Secondary | ICD-10-CM | POA: Insufficient documentation

## 2013-07-09 MED ORDER — ACYCLOVIR 400 MG PO TABS: 800.0000 mg | ORAL_TABLET | Freq: Every day | ORAL | Status: AC

## 2013-07-09 MED ORDER — OXYCODONE-ACETAMINOPHEN 5-325 MG PO TABS: 1.0000 | ORAL_TABLET | ORAL | Status: DC | PRN

## 2013-07-09 MED ORDER — PREDNISONE 10 MG PO TABS: ORAL_TABLET | ORAL | Status: DC

## 2013-07-09 MED ORDER — OXYCODONE-ACETAMINOPHEN 5-325 MG PO TABS
1.0000 | ORAL_TABLET | Freq: Once | ORAL | Status: AC
  Administered 2013-07-09: 1 via ORAL
  Filled 2013-07-09: qty 1

## 2013-07-09 NOTE — ED Provider Notes (Signed)
CSN: [id number]     Arrival date & time 07/09/13  1143 History   First MD Initiated Contact with Patient 07/09/13 1430     Chief Complaint  Patient presents with  . Rash   (Consider location/radiation/quality/duration/timing/severity/associated sxs/prior Treatment) HPI Patient presents with rash to left thoracic back for several days. Patient states that the rash is painful. Initially started as clear bumps but now has crusted over. He denies fevers chills. She denies facial or eye rash. She denies eye pain or vision changes and she denies sick contacts. She denies shortness of breath or chest pain. Patient states she had chickenpox as a child.  Past Medical History  Diagnosis Date  . Hypertension   . Arthritis   . GERD (gastroesophageal reflux disease)   . Depression   . Rheumatoid arthritis(714.0)    Past Surgical History  Procedure Laterality Date  . Breast reduction surgery     History reviewed. No pertinent family history. History  Substance Use Topics  . Smoking status: Current Every Day Smoker    Types: Cigarettes  . Smokeless tobacco: Not on file  . Alcohol Use: Yes   OB History   Grav Para Term Preterm Abortions TAB SAB Ect Mult Living   2 2 2       2      Review of Systems  Constitutional: Negative for fever and chills.  HENT: Negative for neck pain.   Eyes: Negative for pain and visual disturbance.  Respiratory: Negative for shortness of breath.   Cardiovascular: Negative for chest pain.  Gastrointestinal: Negative for nausea and abdominal pain.  Musculoskeletal: Negative for myalgias and back pain.  Skin: Positive for rash.  Neurological: Negative for dizziness, weakness, light-headedness, numbness and headaches.  All other systems reviewed and are negative.    Allergies  Review of patient's allergies indicates no known allergies.  Home Medications   Current Outpatient Rx  Name  Route  Sig  Dispense  Refill  . calcium-vitamin D (OSCAL WITH D) 500-200  MG-UNIT per tablet   Oral   Take 1 tablet by mouth daily.         [name] escitalopram (LEXAPRO) 20 MG tablet   Oral   Take 1 tablet (20 mg total) by mouth daily.   30 tablet   2   . ibuprofen (ADVIL,MOTRIN) 200 MG tablet   Oral   Take 600-800 mg by mouth every 6 (six) hours as needed for pain.         . mirtazapine (REMERON) 15 MG tablet   Oral   Take 1 tablet (15 mg total) by mouth at bedtime.   30 tablet   2   . pantoprazole (PROTONIX) 40 MG tablet   Oral   Take 1 tablet (40 mg total) by mouth daily.   30 tablet   2   . acyclovir (ZOVIRAX) 400 MG tablet   Oral   Take 2 tablets (800 mg total) by mouth 5 (five) times daily.   70 tablet   0   . lisinopril (PRINIVIL,ZESTRIL) 10 MG tablet   Oral   Take 1 tablet (10 mg total) by mouth daily.   30 tablet   2   . methotrexate (RHEUMATREX) 2.5 MG tablet   Oral   Take 17.5 mg by mouth every 7 (seven) days. On Thursdays         . oxyCODONE-acetaminophen (PERCOCET/ROXICET) 5-325 MG per tablet   Oral   Take 1 tablet by mouth every 4 (four) hours as  needed for pain.   15 tablet   0   . predniSONE (DELTASONE) 10 MG tablet      60 mg daily for 7 days, followed by 30 mg daily for 7 days, then 15 mg daily for 7 days   75 tablet   0    BP 127/83  Pulse 81  Temp(Src) 97.7 F (36.5 C) (Oral)  Resp 18  Ht 5\' 1"  (1.549 m)  Wt 181 lb (82.101 kg)  BMI 34.22 kg/m2  SpO2 96% Physical Exam  Nursing note and vitals reviewed. Constitutional: She is oriented to person, place, and time. She appears well-developed and well-nourished. No distress.  HENT:  Head: Normocephalic and atraumatic.  Mouth/Throat: Oropharynx is clear and moist.  Eyes: EOM are normal. Pupils are equal, round, and reactive to light.  Neck: Normal range of motion. Neck supple.  Cardiovascular: Normal rate and regular rhythm.   Pulmonary/Chest: Effort normal and breath sounds normal. No respiratory distress. She has no wheezes. She has no rales.   Abdominal: Soft. Bowel sounds are normal. She exhibits no distension and no mass. There is no tenderness. There is no rebound and no guarding.  Musculoskeletal: Normal range of motion. She exhibits no edema and no tenderness.  Neurological: She is alert and oriented to person, place, and time.  Patient is alert and oriented x3 with clear, goal oriented speech. Patient has 5/5 motor in all extremities. Sensation is intact to light touch. Bilateral finger-to-nose is normal with no signs of dysmetria. Patient has a normal gait and walks without assistance.   Skin: Skin is warm and dry. Rash (patient with crusted over vesicular rash starting midline thoracic region and radiating to left mid axillary region. Rash has an erythematous base. No evidence of superinfection. Characteristic for shingles.) noted. No erythema.  Psychiatric: She has a normal mood and affect. Her behavior is normal.    ED Course  Procedures (including critical care time) Labs Review Labs Reviewed - No data to display Imaging Review No results found.  MDM  No diagnosis found. We'll treat with symptom control and antivirals. Patient advised to followup with her primary doctor.    Loren Racer, MD 07/09/13 [phone]

## 2013-07-09 NOTE — ED Notes (Signed)
Pt reports that she noticed a small rash on her back last week. States the area is painful to touch. Reports she is concerned that it could be shingles. Small area also noted to left underarm.

## 2013-07-09 NOTE — ED Notes (Addendum)
Made PA aware of pt and plan to see pt in triage room

## 2013-11-27 ENCOUNTER — Ambulatory Visit (HOSPITAL_COMMUNITY)
Admission: RE | Admit: 2013-11-27 | Discharge: 2013-11-27 | Disposition: A | Discharge status: Home / Self Care | Payer: Medicare Other | Source: Ambulatory Visit | Attending: Physician Assistant | Admitting: Physician Assistant

## 2013-11-27 ENCOUNTER — Other Ambulatory Visit (HOSPITAL_COMMUNITY): Payer: Self-pay | Admitting: Physician Assistant

## 2013-11-27 DIAGNOSIS — M19019 Primary osteoarthritis, unspecified shoulder: Secondary | ICD-10-CM | POA: Insufficient documentation

## 2013-11-27 DIAGNOSIS — R52 Pain, unspecified: Secondary | ICD-10-CM

## 2013-11-27 DIAGNOSIS — M1710 Unilateral primary osteoarthritis, unspecified knee: Secondary | ICD-10-CM | POA: Insufficient documentation

## 2013-11-27 DIAGNOSIS — M47817 Spondylosis without myelopathy or radiculopathy, lumbosacral region: Secondary | ICD-10-CM | POA: Insufficient documentation

## 2014-09-02 ENCOUNTER — Encounter (HOSPITAL_COMMUNITY): Payer: Self-pay | Admitting: Cardiology

## 2017-11-02 ENCOUNTER — Other Ambulatory Visit: Payer: Self-pay

## 2017-11-02 ENCOUNTER — Encounter (HOSPITAL_COMMUNITY): Payer: Self-pay | Admitting: Emergency Medicine

## 2017-11-02 ENCOUNTER — Emergency Department (HOSPITAL_COMMUNITY): Payer: Medicare Other

## 2017-11-02 ENCOUNTER — Emergency Department (HOSPITAL_COMMUNITY)
Admission: EM | Admit: 2017-11-02 | Discharge: 2017-11-02 | Disposition: A | Discharge status: Home / Self Care | Payer: Medicare Other | Attending: Emergency Medicine | Admitting: Emergency Medicine

## 2017-11-02 DIAGNOSIS — Y92000 Kitchen of unspecified non-institutional (private) residence as  the place of occurrence of the external cause: Secondary | ICD-10-CM | POA: Insufficient documentation

## 2017-11-02 DIAGNOSIS — W010XXA Fall on same level from slipping, tripping and stumbling without subsequent striking against object, initial encounter: Secondary | ICD-10-CM | POA: Insufficient documentation

## 2017-11-02 DIAGNOSIS — Y999 Unspecified external cause status: Secondary | ICD-10-CM | POA: Insufficient documentation

## 2017-11-02 DIAGNOSIS — W25XXXA Contact with sharp glass, initial encounter: Secondary | ICD-10-CM | POA: Insufficient documentation

## 2017-11-02 DIAGNOSIS — Y939 Activity, unspecified: Secondary | ICD-10-CM | POA: Insufficient documentation

## 2017-11-02 DIAGNOSIS — I10 Essential (primary) hypertension: Secondary | ICD-10-CM | POA: Insufficient documentation

## 2017-11-02 DIAGNOSIS — Z79899 Other long term (current) drug therapy: Secondary | ICD-10-CM | POA: Diagnosis not present

## 2017-11-02 DIAGNOSIS — Z23 Encounter for immunization: Secondary | ICD-10-CM | POA: Insufficient documentation

## 2017-11-02 DIAGNOSIS — S51821A Laceration with foreign body of right forearm, initial encounter: Secondary | ICD-10-CM | POA: Diagnosis not present

## 2017-11-02 DIAGNOSIS — S51811A Laceration without foreign body of right forearm, initial encounter: Secondary | ICD-10-CM

## 2017-11-02 MED ORDER — CEPHALEXIN 500 MG PO CAPS: 500.0000 mg | ORAL_CAPSULE | Freq: Four times a day (QID) | ORAL | 0 refills | Status: DC

## 2017-11-02 MED ORDER — ACETAMINOPHEN 325 MG PO TABS
650.0000 mg | ORAL_TABLET | Freq: Once | ORAL | Status: AC
  Administered 2017-11-02: 650 mg via ORAL
  Filled 2017-11-02: qty 2

## 2017-11-02 MED ORDER — TETANUS-DIPHTH-ACELL PERTUSSIS 5-2.5-18.5 LF-MCG/0.5 IM SUSP
0.5000 mL | Freq: Once | INTRAMUSCULAR | Status: AC
  Administered 2017-11-02: 0.5 mL via INTRAMUSCULAR
  Filled 2017-11-02: qty 0.5

## 2017-11-02 MED ORDER — LIDOCAINE-EPINEPHRINE (PF) 2 %-1:200000 IJ SOLN
10.0000 mL | Freq: Once | INTRAMUSCULAR | Status: AC
  Administered 2017-11-02: 10 mL
  Filled 2017-11-02: qty 20

## 2017-11-02 MED ORDER — ACETAMINOPHEN 325 MG PO TABS: 650.0000 mg | ORAL_TABLET | Freq: Four times a day (QID) | ORAL | 0 refills | Status: DC | PRN

## 2017-11-02 NOTE — Discharge Instructions (Signed)
You were seen in the emergency department after a fall.  X-rays of your right elbow and right forearm as well as your left knee do not show any fractures or dislocations.  You had stitches placed into a laceration in your right forearm.  3 absorbable sutures were placed underneath the skin, these will not need to be removed.  10 sutures were placed to the top of the skin- these will need to be removed in 7 days.  You may follow-up with your primary care provider, go to an urgent care, or return to the emergency department in 7 days for suture removal.  For the next 24 hours is important that you do not get the wound wet, after this may get the area wet but may not soak it.  If the area around the wound starts to get red, there is discharge coming from the wound, you start to have fevers or chills return to the emergency department as these are signs that your wound might be infected.  I given you 2 prescriptions today:  -Tylenol-she may take this once every 6 hours as needed for discomfort.  Supplement with ibuprofen.  - Keflex-this is an antibiotic.  You will need to take this 4 times per day.  This is to prevent infection.  Please take all of your antibiotics until finished. You may develop abdominal discomfort or diarrhea from the antibiotic.  You may help offset this with probiotics which you can buy at the store (ask your pharmacist if unable to find) or get probiotics in the form of eating yogurt. Do not eat or take the probiotics until 2 hours after your antibiotic. If you are unable to tolerate these side effects follow-up with your primary care provider or return to the emergency department.   If you begin to experience any blistering, rashes, swelling, or difficulty breathing seek medical care for evaluation of potentially more serious side effects.   Please be aware that this medication may interact with other medications you are taking, please be sure to discuss your medication list with  your pharmacist.   Return to the emergency department for any new or worsening symptoms.

## 2017-11-02 NOTE — ED Notes (Signed)
Patient transported to X-ray 

## 2017-11-02 NOTE — ED Provider Notes (Signed)
[facility] EMERGENCY DEPARTMENT Provider Note   CSN: [id number] Arrival date & time: 11/02/17  1635     History   Chief Complaint Chief Complaint  Patient presents with  . Fall  . Extremity Laceration    HPI Erika [name] is a 62 y.o. female with a hx of tobacco abuse, hypertension, and RA who presents to the ED s/p mechanical fall 1 hour PTA complaining of a laceration to the right forearm, right upper extremity pain, and left lower extremity pain.  Patient states that she was walking in the door carrying a crock pot and wearing slippers when she tripped over the doorway causing her to fall. No dyspnea, chest pain, lightheadedness, or dizziness prior to fall or at present.  No head injury or loss of consciousness.  Patient states that she fell forward and dropped the crock pot.  The glass shattered and caused the cut in her right forearm.  Having pain in the area of the cut as well as the right elbow and having pain in her left knee. Pain is constant. No alleviating/aggravating factors. No other areas of injury.  No numbness or weakness. Patient is R hand dominant. Unsure of last tetanus.   HPI  Past Medical History:  Diagnosis Date  . Arthritis   . Depression   . GERD (gastroesophageal reflux disease)   . Hypertension   . Rheumatoid arthritis(714.0)     There are no active problems to display for this patient.   Past Surgical History:  Procedure Laterality Date  . BREAST REDUCTION SURGERY      OB History    Gravida Para Term Preterm AB Living   2 2 2     2    SAB TAB Ectopic Multiple Live Births                   Home Medications    Prior to Admission medications   Medication Sig Start Date End Date Taking? Authorizing Provider  calcium-vitamin D (OSCAL WITH D) 500-200 MG-UNIT per tablet Take 1 tablet by mouth daily.    [provider]  escitalopram (LEXAPRO) 20 MG tablet Take 1 tablet (20 mg total) by mouth daily. 11/21/12    Johnson, Clanford L, MD  ibuprofen (ADVIL,MOTRIN) 200 MG tablet Take 600-800 mg by mouth every 6 (six) hours as needed for pain. 12/25/12   Dhungel, 12/27/12, MD  lisinopril (PRINIVIL,ZESTRIL) 10 MG tablet Take 1 tablet (10 mg total) by mouth daily. 11/21/12   Johnson, Clanford L, MD  methotrexate (RHEUMATREX) 2.5 MG tablet Take 17.5 mg by mouth every 7 (seven) days. On Thursdays    [provider]  mirtazapine (REMERON) 15 MG tablet Take 1 tablet (15 mg total) by mouth at bedtime. 11/21/12   Johnson, Clanford L, MD  oxyCODONE-acetaminophen (PERCOCET/ROXICET) 5-325 MG per tablet Take 1 tablet by mouth every 4 (four) hours as needed for pain. 07/09/13   09/08/13, Rubye Oaks, PA-C  pantoprazole (PROTONIX) 40 MG tablet Take 1 tablet (40 mg total) by mouth daily. 11/21/12   11/23/12, MD    Family History No family history on file.  Social History Social History   Tobacco Use  . Smoking status: Current Every Day Smoker    Types: Cigarettes  . Smokeless tobacco: Current User  Substance Use Topics  . Alcohol use: Yes  . Drug use: No     Allergies   Patient has no known allergies.   Review of Systems Review of  Systems  Respiratory: Negative for shortness of breath.   Cardiovascular: Negative for chest pain.  Musculoskeletal: Positive for arthralgias (L knee). Negative for back pain and neck pain.       Positive for R forearm and elbow pain.   Skin: Positive for wound (laceration to R forearm).  Neurological: Negative for dizziness, weakness, light-headedness and numbness.   Physical Exam Updated Vital Signs BP (!) 123/99 (BP Location: Right Arm)   Pulse 87   Temp 98.4 F (36.9 C) (Oral)   Resp 17   Ht 5\' 1"  (1.549 m)   Wt 66.7 kg (147 lb)   SpO2 99%   BMI 27.78 kg/m   Physical Exam  Constitutional: She appears well-developed and well-nourished.  Non-toxic appearance. No distress.  HENT:  Head: Normocephalic and atraumatic.  Eyes: Conjunctivae and EOM are  normal. Pupils are equal, round, and reactive to light. Right eye exhibits no discharge. Left eye exhibits no discharge.  Neck: Neck supple. No spinous process tenderness present.  Cardiovascular: Normal rate and regular rhythm.  Pulses:      Radial pulses are 2+ on the right side, and 2+ on the left side.       Posterior tibial pulses are 2+ on the right side, and 2+ on the left side.  Pulmonary/Chest: Effort normal and breath sounds normal.  Abdominal: Soft. She exhibits no distension. There is no tenderness.  Musculoskeletal:  Back: no midline tenderness.  Extremities: No obvious deformity, appreciable swelling, erythema, or warmth. Patient has full ROM at all joints of upper and lower extremities. RUE: lateral epicondyle tenderness, diffuse tenderness to proximal forearm. Otherwise no bony tenderness, no tenderness of medial epicondyle or olecranon. LLE: patellar tenderness, otherwise no bony tenderness.   Neurological: She is alert.  Clear speech. 5/5 grip strength bilaterally. 5/5 strength with bilateral wrist flexion/extension, knee flexion/extension, and ankle plantar/dorsiflexion. Sensation grossly intact to bilateral upper and lower extremities.   Skin: Capillary refill takes less than 2 seconds. Abrasion (L knee) and laceration (4 cm to to the dorsolateral aspect of the proximal R forearm, with visible foreign bodies, there is a 1.5 cm superficial cutsuperior to the olecranon, there is a 1.5 cm superficial cut mid forearm) noted.  Psychiatric: She has a normal mood and affect. Her behavior is normal. Thought content normal.  Nursing note and vitals reviewed.  ED Treatments / Results  Labs (all labs ordered are listed, but only abnormal results are displayed) Labs Reviewed - No data to display  EKG  EKG Interpretation None      Radiology Dg Elbow Complete Right  Result Date: 11/02/2017 CLINICAL DATA:  12/31/2017 in kitchen today at 1600 hrs while carrying a crock pot which shattered  causing laceration RIGHT forearm, RIGHT forearm and LEFT knee pain post fall EXAM: RIGHT ELBOW - COMPLETE 3+ VIEW COMPARISON:  None FINDINGS: Joint space narrowing and spur formation at radiocapitellar joint. Mild joint space narrowing at remainder of elbow joint as well. Diffuse osseous demineralization. No acute fracture or dislocation. Multiple radiopaque foreign bodies identified at the dorsal soft tissues of the proximal forearm. IMPRESSION: Degenerative changes RIGHT elbow. No acute osseous abnormalities. Multiple radiopaque foreign bodies at the dorsal soft tissues of the proximal RIGHT forearm. Electronically Signed   By: Larey Seat M.D.   On: 11/02/2017 18:04   Dg Forearm Right  Result Date: 11/02/2017 CLINICAL DATA:  Check for foreign bodies EXAM: RIGHT FOREARM - 2 VIEW COMPARISON:  12/20/2017 at 1730 hours FINDINGS: Interval cleansing of soft  tissue wound along the dorsal proximal aspect of the forearm. Punctate focus of soft tissue debris is noted but the vast majority of soft tissue foreign bodies are no longer present. No underlying osseous abnormality. 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 No acute fracture. IMPRESSION: 1. Interval cleansing of soft tissue wound along the dorsal proximal aspect of the right forearm. The vast majority of soft tissue foreign bodies are no longer present. There appears to be punctate focus of soft tissue debris remaining. 2. No acute osseous abnormality. Electronically Signed   By: Tollie Eth M.D.   On: 11/02/2017 19:54   Dg Forearm Right  Result Date: 11/02/2017 CLINICAL DATA:  Larey Seat in kitchen today at 1600 hrs while carrying a crock pot which shattered causing laceration RIGHT forearm, RIGHT forearm and LEFT knee pain post fall EXAM: RIGHT FOREARM - 2 VIEW COMPARISON:  None FINDINGS: Osseous demineralization. Mild radiocarpal joint space narrowing. Mild joint space narrowing and spur formation at radiocapitellar joint as well. No acute fracture,  dislocation or bone destruction. Multiple radiopaque foreign bodies identified at the dorsal soft tissues of the proximal forearm. IMPRESSION: Osseous demineralization with mild degenerative changes at elbow and wrist. No acute bony abnormalities. Multiple radiopaque foreign bodies at the dorsal aspect of the proximal forearm. Electronically Signed   By: Ulyses Southward M.D.   On: 11/02/2017 18:03   Dg Knee Complete 4 Views Left  Result Date: 11/02/2017 CLINICAL DATA:  Larey Seat in kitchen today at 1600 hrs while carrying a crock pot which shattered causing laceration RIGHT forearm, RIGHT forearm and LEFT knee pain post fall EXAM: LEFT KNEE - COMPLETE 4+ VIEW COMPARISON:  11/27/2013 FINDINGS: Osseous demineralization. Tricompartmental osteoarthritic changes with joint space narrowing and spur formation. Minimal joint effusion. No acute fracture, dislocation, or bone destruction. 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 No acute osseous abnormalities. Electronically Signed   By: Ulyses Southward M.D.   On: 11/02/2017 18:05   Procedures .Marland KitchenLaceration Repair Date/Time: 11/02/2017 9:13 PM Performed by: Cherly Anderson, PA-C Authorized by: Cherly Anderson, PA-C   Consent:    Consent obtained:  Verbal   Consent given by:  Patient   Risks discussed:  Infection, poor wound healing, poor cosmetic result, retained foreign body, pain, vascular damage and nerve damage   Alternatives discussed:  No treatment and delayed treatment Anesthesia (see MAR for exact dosages):    Anesthesia method:  Local infiltration   Local anesthetic:  Lidocaine 2% WITH epi Laceration details:    Location:  Shoulder/arm   Shoulder/arm location:  R lower arm   Length (cm):  4 Repair type:    Repair type:  Intermediate Pre-procedure details:    Preparation:  Patient was prepped and draped in usual sterile fashion and imaging obtained to evaluate for foreign bodies Exploration:     Hemostasis achieved with:  Direct pressure   Wound exploration: wound explored through full range of motion and entire depth of wound probed and visualized     Wound extent: foreign bodies/material (Multiple foreign bodies- irrigated in 1 L saline, repeat X-ray with punctate soft tissue debris remaining, repeat irrigation with 1L of saline following this, no FB visualized at time of closure)     Wound extent: no nerve damage noted, no tendon damage noted, no underlying fracture noted and no vascular damage noted   Treatment:    Area cleansed with:  Betadine and saline   Amount of cleaning:  Extensive   Irrigation solution:  Sterile saline  Irrigation volume:  2 L total   Irrigation method:  Pressure wash   Visualized foreign bodies/material removed: yes   Subcutaneous repair:    Suture size:  5-0   Suture material:  Vicryl   Suture technique:  Horizontal mattress   Number of sutures:  3 Skin repair:    Repair method:  Sutures   Suture size:  4-0   Suture material:  Nylon   Suture technique:  Simple interrupted   Number of sutures:  10 Approximation:    Approximation:  Close   Vermilion border: well-aligned   Post-procedure details:    Dressing:  Antibiotic ointment and non-adherent dressing   Patient tolerance of procedure:  Tolerated well, no immediate complications     (including critical care time)  Medications Ordered in ED Medications  lidocaine-EPINEPHrine (XYLOCAINE W/EPI) 2 %-1:200000 (PF) injection 10 mL (10 mLs Infiltration Given 11/02/17 1805)  Tdap (BOOSTRIX) injection 0.5 mL (0.5 mLs Intramuscular Given 11/02/17 1805)  acetaminophen (TYLENOL) tablet 650 mg (650 mg Oral Given 11/02/17 1805)   Initial Impression / Assessment and Plan / ED Course  I have reviewed the triage vital signs and the nursing notes.  Pertinent labs & imaging results that were available during my care of the patient were reviewed by me and considered in my medical decision making (see chart for  details).  Patient presents s/p mechanical fall with RUE laceration and RUE & LLE pain. Patient is nontoxic appearing, in no apparent distress, initial diastolic BP elevated this improved, vitals otherwise WNL. Patient NVI distal to all areas of injury. X-rays negative for fracture or dislocation. Multiple radiopaque foreign bodies on initial X-ray, following 1 L saline pressure irrigation repeat X-ray obtained revealed vast majority of soft tissue foreign bodies no longer present, there was however what appeared to be punctate focus of soft tissue debris remaining. Additional 1 L of irrigation performed. Discussed the x-ray results as well as the risks/benefits of laceration repair with the patient. Patient consented to laceration repair following additional irrigation. Wound explored and base of wound visualized in a bloodless field without evidence of foreign body at time of repair. Laceration repaired with two layer closure per procedure note. Tdap updated. Pain improved with Tylenol in the ED. Given hx of tobacco abuse and hx of MTX therapy will treat with Keflex prophylactically. Discussed suture home care with patient and answered questions. Patient to follow-up for wound check and suture removal in 7 days; they are to return to the ED sooner for signs of infection. Patient NVI distally following procedure. I discussed results, treatment plan, need for follow up for wound recheck/suture removal, and return precautions with the patient. Provided opportunity for questions, patient confirmed understanding and is in agreement with plan.   Final Clinical Impressions(s) / ED Diagnoses   Final diagnoses:  Laceration of right forearm, initial encounter    ED Discharge Orders        Ordered    acetaminophen (TYLENOL) 325 MG tablet  Every 6 hours PRN     11/02/17 2112    cephALEXin (KEFLEX) 500 MG capsule  4 times daily     11/02/17 2112       Cherly Anderson, PA-C 11/03/17 0231    Charlynne Pander, MD 11/05/17 [phone]

## 2017-11-02 NOTE — ED Notes (Signed)
PT states understanding of care given, follow up care, and medication prescribed. PT ambulated from ED to car with a steady gait. 

## 2017-11-02 NOTE — ED Triage Notes (Signed)
Pt. Stated, I tripped in kitchen holding a crock pot and cut my rt. forearm

## 2018-09-23 ENCOUNTER — Other Ambulatory Visit: Payer: Self-pay

## 2018-09-23 ENCOUNTER — Encounter (HOSPITAL_COMMUNITY): Payer: Self-pay

## 2018-09-23 ENCOUNTER — Emergency Department (HOSPITAL_COMMUNITY)
Admission: EM | Admit: 2018-09-23 | Discharge: 2018-09-23 | Disposition: A | Discharge status: Home / Self Care | Payer: Medicare Other | Attending: Emergency Medicine | Admitting: Emergency Medicine

## 2018-09-23 DIAGNOSIS — F17210 Nicotine dependence, cigarettes, uncomplicated: Secondary | ICD-10-CM | POA: Insufficient documentation

## 2018-09-23 DIAGNOSIS — R21 Rash and other nonspecific skin eruption: Secondary | ICD-10-CM | POA: Diagnosis present

## 2018-09-23 DIAGNOSIS — Z79899 Other long term (current) drug therapy: Secondary | ICD-10-CM | POA: Insufficient documentation

## 2018-09-23 DIAGNOSIS — B027 Disseminated zoster: Secondary | ICD-10-CM | POA: Diagnosis not present

## 2018-09-23 DIAGNOSIS — I10 Essential (primary) hypertension: Secondary | ICD-10-CM | POA: Insufficient documentation

## 2018-09-23 DIAGNOSIS — B0229 Other postherpetic nervous system involvement: Secondary | ICD-10-CM

## 2018-09-23 MED ORDER — GABAPENTIN 100 MG PO CAPS: 100.0000 mg | ORAL_CAPSULE | Freq: Three times a day (TID) | ORAL | 0 refills | Status: DC

## 2018-09-23 NOTE — ED Notes (Signed)
Patient given discharge instructions and verbalized understanding.  Patient stable to discharge at this time.  Patient is alert and oriented to baseline.  No distressed noted at this time.  All belongings taken with the patient at discharge.   

## 2018-09-23 NOTE — ED Triage Notes (Signed)
PT here for eval of what she thinks is shingles. 3-4 scabs noted throughout her upper and lower back in total. Pt reports numbness and tingling to bilateral legs, no neuro deficits. She is ambulatory without difficulty. Pt states hx of shingles a couple years ago.

## 2018-09-23 NOTE — ED Triage Notes (Signed)
Pt has had breakouts on back and bottom x2 weeks.

## 2018-09-23 NOTE — ED Notes (Signed)
Got patient undress on the monitor family is at bedside and call ell in reach

## 2018-09-23 NOTE — ED Provider Notes (Signed)
[facility] EMERGENCY DEPARTMENT Provider Note   CSN: [id number] Arrival date & time: 09/23/18  9381     History   Chief Complaint Chief Complaint  Patient presents with  . possible shingles, scabbed  . Numbness    both legs, but ambulatory    HPI Erika [name] is a 62 y.o. female.  HPI Patient presents with rash that started 2 weeks ago to the face, upper and lower back bilaterally.  She also developed paresthesias to both lower extremities especially to the feet which she describes as burning.  No focal weakness or numbness.  No visual changes or eye pain.  No fever or chills.  Patient states lesions have been crusted and she has no new lesions since onset 2 weeks ago.  She was evaluated in the emergency department 5 years ago with herpes zoster and states that rash is similar. Past Medical History:  Diagnosis Date  . Arthritis   . Depression   . GERD (gastroesophageal reflux disease)   . Hypertension   . Rheumatoid arthritis(714.0)     There are no active problems to display for this patient.   Past Surgical History:  Procedure Laterality Date  . BREAST REDUCTION SURGERY       OB History    Gravida  2   Para  2   Term  2   Preterm      AB      Living  2     SAB      TAB      Ectopic      Multiple      Live Births               Home Medications    Prior to Admission medications   Medication Sig Start Date End Date Taking? Authorizing Provider  acetaminophen (TYLENOL) 325 MG tablet Take 2 tablets (650 mg total) by mouth every 6 (six) hours as needed. 11/02/17   Petrucelli, Samantha R, PA-C  calcium-vitamin D (OSCAL WITH D) 500-200 MG-UNIT per tablet Take 1 tablet by mouth daily.    [provider]  cephALEXin (KEFLEX) 500 MG capsule Take 1 capsule (500 mg total) by mouth 4 (four) times daily. 11/02/17   Petrucelli, Samantha R, PA-C  escitalopram (LEXAPRO) 20 MG tablet Take 1 tablet (20 mg total) by mouth daily.  11/21/12   Johnson, Clanford L, MD  gabapentin (NEURONTIN) 100 MG capsule Take 1 capsule (100 mg total) by mouth 3 (three) times daily. 09/23/18   Loren Racer, MD  ibuprofen (ADVIL,MOTRIN) 200 MG tablet Take 600-800 mg by mouth every 6 (six) hours as needed for pain. 12/25/12   Dhungel, Theda Belfast, MD  lisinopril (PRINIVIL,ZESTRIL) 10 MG tablet Take 1 tablet (10 mg total) by mouth daily. 11/21/12   Johnson, Clanford L, MD  methotrexate (RHEUMATREX) 2.5 MG tablet Take 17.5 mg by mouth every 7 (seven) days. On Thursdays    [provider]  mirtazapine (REMERON) 15 MG tablet Take 1 tablet (15 mg total) by mouth at bedtime. 11/21/12   Johnson, Clanford L, MD  oxyCODONE-acetaminophen (PERCOCET/ROXICET) 5-325 MG per tablet Take 1 tablet by mouth every 4 (four) hours as needed for pain. 07/09/13   Rubye Oaks, Janene Harvey, PA-C  pantoprazole (PROTONIX) 40 MG tablet Take 1 tablet (40 mg total) by mouth daily. 11/21/12   Cleora Fleet, MD    Family History No family history on file.  Social History Social History   Tobacco Use  .  Smoking status: Current Every Day Smoker    Packs/day: 0.50    Types: Cigarettes  . Smokeless tobacco: Current User  Substance Use Topics  . Alcohol use: Yes    Comment: sometimes none in a week  . Drug use: No     Allergies   Patient has no known allergies.   Review of Systems Review of Systems  Constitutional: Negative for chills and fever.  HENT: Negative for facial swelling, sinus pressure, sore throat and trouble swallowing.   Eyes: Negative for visual disturbance.  Respiratory: Negative for cough and shortness of breath.   Cardiovascular: Negative for chest pain and palpitations.  Gastrointestinal: Negative for abdominal pain, diarrhea, nausea and vomiting.  Genitourinary: Negative for dysuria, flank pain and frequency.  Musculoskeletal: Negative for myalgias, neck pain and neck stiffness.  Skin: Positive for rash.  Neurological: Positive for  numbness. Negative for dizziness, weakness, light-headedness and headaches.  All other systems reviewed and are negative.    Physical Exam Updated Vital Signs BP (!) 110/99   Pulse 83   Temp 97.8 F (36.6 C) (Oral)   Resp 18   Ht 5\' 1"  (1.549 m)   Wt 65.8 kg   SpO2 100%   BMI 27.40 kg/m   Physical Exam  Constitutional: She is oriented to person, place, and time. She appears well-developed and well-nourished.  HENT:  Head: Normocephalic and atraumatic.  Mouth/Throat: Oropharynx is clear and moist.  Patient with several crusted over lesions to the right forehead and mucosa of the soft palate on the right.  Eyes: Pupils are equal, round, and reactive to light. EOM are normal.  Neck: Normal range of motion. Neck supple. No JVD present.  Cardiovascular: Normal rate and regular rhythm.  Pulmonary/Chest: Effort normal and breath sounds normal.  Abdominal: Soft. Bowel sounds are normal. There is no tenderness. There is no rebound and no guarding.  Musculoskeletal: Normal range of motion. She exhibits no edema or tenderness.  No midline thoracic or lumbar tenderness.  Lymphadenopathy:    She has no cervical adenopathy.  Neurological: She is alert and oriented to person, place, and time.  5/5 motor in all extremities.  Diminished sensation to bilateral feet.  No saddle anesthesia.  Skin: Skin is warm and dry. No rash noted. No erythema.  Patient has several crusted lesions upper thoracic back and sacral region bilaterally.  Few scattered lesions that radiates to the lateral hips. No evidence of secondary infection.  Psychiatric: She has a normal mood and affect. Her behavior is normal.  Nursing note and vitals reviewed.    ED Treatments / Results  Labs (all labs ordered are listed, but only abnormal results are displayed) Labs Reviewed - No data to display  EKG None  Radiology No results found.  Procedures Procedures (including critical care time)  Medications Ordered in  ED Medications - No data to display   Initial Impression / Assessment and Plan / ED Course  I have reviewed the triage vital signs and the nursing notes.  Pertinent labs & imaging results that were available during my care of the patient were reviewed by me and considered in my medical decision making (see chart for details).    Disseminated crusted over lesions to the upper and lower spine.  Concerning for possible disseminated herpes zoster.  However given that the lesions are crusted over think there would be little benefit in starting antiviral.  Patient likely is having postherpetic neuralgia.  No red flag signs or symptoms.  No  weakness.  Ambulating without difficulty.  Will start on gabapentin for symptom control.  Strict return precautions given.  Final Clinical Impressions(s) / ED Diagnoses   Final diagnoses:  Disseminated herpes zoster  Postherpetic neuralgia    ED Discharge Orders         Ordered    gabapentin (NEURONTIN) 100 MG capsule  3 times daily     09/23/18 0919           Loren Racer, MD 09/23/18 [phone]

## 2018-09-27 ENCOUNTER — Emergency Department (HOSPITAL_COMMUNITY)
Admission: EM | Admit: 2018-09-27 | Discharge: 2018-09-27 | Disposition: A | Discharge status: Home / Self Care | Payer: Medicare Other | Attending: Emergency Medicine | Admitting: Emergency Medicine

## 2018-09-27 ENCOUNTER — Encounter (HOSPITAL_COMMUNITY): Payer: Self-pay | Admitting: Emergency Medicine

## 2018-09-27 ENCOUNTER — Other Ambulatory Visit: Payer: Self-pay

## 2018-09-27 DIAGNOSIS — F17210 Nicotine dependence, cigarettes, uncomplicated: Secondary | ICD-10-CM | POA: Diagnosis not present

## 2018-09-27 DIAGNOSIS — I10 Essential (primary) hypertension: Secondary | ICD-10-CM | POA: Insufficient documentation

## 2018-09-27 DIAGNOSIS — Z79899 Other long term (current) drug therapy: Secondary | ICD-10-CM | POA: Insufficient documentation

## 2018-09-27 DIAGNOSIS — R21 Rash and other nonspecific skin eruption: Secondary | ICD-10-CM | POA: Diagnosis present

## 2018-09-27 DIAGNOSIS — B007 Disseminated herpesviral disease: Secondary | ICD-10-CM | POA: Insufficient documentation

## 2018-09-27 DIAGNOSIS — B027 Disseminated zoster: Secondary | ICD-10-CM

## 2018-09-27 MED ORDER — MORPHINE SULFATE 15 MG PO TABS
15.0000 mg | ORAL_TABLET | Freq: Once | ORAL | Status: AC
  Administered 2018-09-27: 15 mg via ORAL
  Filled 2018-09-27: qty 1

## 2018-09-27 MED ORDER — MORPHINE SULFATE 15 MG PO TABS: 15.0000 mg | ORAL_TABLET | ORAL | 0 refills | Status: DC | PRN

## 2018-09-27 MED ORDER — ACETAMINOPHEN 500 MG PO TABS
1000.0000 mg | ORAL_TABLET | Freq: Once | ORAL | Status: AC
Start: 2018-09-27 — End: 2018-09-27
  Administered 2018-09-27: 1000 mg via ORAL
  Filled 2018-09-27: qty 2

## 2018-09-27 MED ORDER — KETOROLAC TROMETHAMINE 60 MG/2ML IM SOLN
15.0000 mg | Freq: Once | INTRAMUSCULAR | Status: AC
  Administered 2018-09-27: 15 mg via INTRAMUSCULAR
  Filled 2018-09-27: qty 2

## 2018-09-27 NOTE — ED Provider Notes (Signed)
[facility] EMERGENCY DEPARTMENT Provider Note   CSN: [id number] Arrival date & time: 09/27/18  1218     History   Chief Complaint Chief Complaint  Patient presents with  . Herpes Zoster    HPI Erika [name] is a 62 y.o. female.  62 yo F with a chief complaint of painful rash.  This been going on for the past few weeks.  She is actually seen in the ED about a week ago and was diagnosed with disseminated herpes zoster.  She has had that previously.  She has a history of rheumatoid arthritis but is not on any DMARDs, she denies any systemic symptoms.  Complaining mostly of shooting pain that goes to both of her feet.  She denies any new lesions.  She is having lots of trouble sleeping at night due to the pain.  Is taking the gabapentin as prescribed but has not provided much relief.  The history is provided by the patient and a relative.  Illness  This is a new problem. The current episode started 2 days ago. The problem occurs constantly. The problem has been gradually worsening. Pertinent negatives include no chest pain, no abdominal pain, no headaches and no shortness of breath. Nothing aggravates the symptoms. Nothing relieves the symptoms. She has tried nothing for the symptoms. The treatment provided no relief.    Past Medical History:  Diagnosis Date  . Arthritis   . Depression   . GERD (gastroesophageal reflux disease)   . Hypertension   . Rheumatoid arthritis(714.0)     There are no active problems to display for this patient.   Past Surgical History:  Procedure Laterality Date  . BREAST REDUCTION SURGERY       OB History    Gravida  2   Para  2   Term  2   Preterm      AB      Living  2     SAB      TAB      Ectopic      Multiple      Live Births               Home Medications    Prior to Admission medications   Medication Sig Start Date End Date Taking? Authorizing Provider  acetaminophen (TYLENOL) 325 MG  tablet Take 2 tablets (650 mg total) by mouth every 6 (six) hours as needed. 11/02/17   Petrucelli, Samantha R, PA-C  calcium-vitamin D (OSCAL WITH D) 500-200 MG-UNIT per tablet Take 1 tablet by mouth daily.    [provider]  cephALEXin (KEFLEX) 500 MG capsule Take 1 capsule (500 mg total) by mouth 4 (four) times daily. 11/02/17   Petrucelli, Samantha R, PA-C  escitalopram (LEXAPRO) 20 MG tablet Take 1 tablet (20 mg total) by mouth daily. 11/21/12   Johnson, Clanford L, MD  gabapentin (NEURONTIN) 100 MG capsule Take 1 capsule (100 mg total) by mouth 3 (three) times daily. 09/23/18   Loren Racer, MD  ibuprofen (ADVIL,MOTRIN) 200 MG tablet Take 600-800 mg by mouth every 6 (six) hours as needed for pain. 12/25/12   Dhungel, Theda Belfast, MD  lisinopril (PRINIVIL,ZESTRIL) 10 MG tablet Take 1 tablet (10 mg total) by mouth daily. 11/21/12   Johnson, Clanford L, MD  methotrexate (RHEUMATREX) 2.5 MG tablet Take 17.5 mg by mouth every 7 (seven) days. On Thursdays    [provider]  mirtazapine (REMERON) 15 MG tablet Take 1 tablet (15 mg  total) by mouth at bedtime. 11/21/12   Johnson, Clanford L, MD  morphine (MSIR) 15 MG tablet Take 1 tablet (15 mg total) by mouth every 4 (four) hours as needed for severe pain. 09/27/18   Melene Plan, DO  oxyCODONE-acetaminophen (PERCOCET/ROXICET) 5-325 MG per tablet Take 1 tablet by mouth every 4 (four) hours as needed for pain. 07/09/13   Rubye Oaks, Janene Harvey, PA-C  pantoprazole (PROTONIX) 40 MG tablet Take 1 tablet (40 mg total) by mouth daily. 11/21/12   Cleora Fleet, MD    Family History History reviewed. No pertinent family history.  Social History Social History   Tobacco Use  . Smoking status: Current Every Day Smoker    Packs/day: 0.50    Types: Cigarettes  . Smokeless tobacco: Current User  Substance Use Topics  . Alcohol use: Yes    Comment: sometimes none in a week  . Drug use: No     Allergies   Patient has no known  allergies.   Review of Systems Review of Systems  Constitutional: Negative for chills and fever.  HENT: Negative for congestion and rhinorrhea.   Eyes: Negative for redness and visual disturbance.  Respiratory: Negative for shortness of breath and wheezing.   Cardiovascular: Negative for chest pain and palpitations.  Gastrointestinal: Negative for abdominal pain, nausea and vomiting.  Genitourinary: Negative for dysuria and urgency.  Musculoskeletal: Negative for arthralgias and myalgias.  Skin: Negative for pallor and wound.  Neurological: Negative for dizziness and headaches.     Physical Exam Updated Vital Signs BP 125/86 (BP Location: Right Arm)   Pulse 73   Temp 98.2 F (36.8 C) (Oral)   Resp 18   Ht 5\' 1"  (1.549 m)   Wt 65.8 kg   SpO2 100%   BMI 27.40 kg/m   Physical Exam  Constitutional: She is oriented to person, place, and time. She appears well-developed and well-nourished. No distress.  HENT:  Head: Normocephalic and atraumatic.  Eyes: Pupils are equal, round, and reactive to light. EOM are normal.  Neck: Normal range of motion. Neck supple.  Cardiovascular: Normal rate and regular rhythm. Exam reveals no gallop and no friction rub.  No murmur heard. Pulmonary/Chest: Effort normal. She has no wheezes. She has no rales.  Abdominal: Soft. She exhibits no distension. There is no tenderness.  Musculoskeletal: She exhibits no edema or tenderness.  Neurological: She is alert and oriented to person, place, and time.  Skin: Skin is warm and dry. Rash noted. She is not diaphoretic.  Diffuse rash across the face, lower back scabbed over lesions.  Psychiatric: She has a normal mood and affect. Her behavior is normal.  Nursing note and vitals reviewed.    ED Treatments / Results  Labs (all labs ordered are listed, but only abnormal results are displayed) Labs Reviewed - No data to display  EKG None  Radiology No results found.  Procedures Procedures  (including critical care time)  Medications Ordered in ED Medications  morphine (MSIR) tablet 15 mg (has no administration in time range)  acetaminophen (TYLENOL) tablet 1,000 mg (has no administration in time range)  ketorolac (TORADOL) injection 15 mg (has no administration in time range)     Initial Impression / Assessment and Plan / ED Course  I have reviewed the triage vital signs and the nursing notes.  Pertinent labs & imaging results that were available during my care of the patient were reviewed by me and considered in my medical decision making (see chart for  details).     62 yo F with a chief complaint of a painful rash.  Diagnoses disseminated zoster on her last ED visit about a week ago.  She has no systemic symptoms no fevers has been doing well at home.  I suspect that the diagnosis was likely correct, at this point her pain she feels is not well controlled.  We will give her a very short course of narcotics to take primarily to help her sleep and will have her follow-up with her family doctor on Monday.   12:48 PM:  I have discussed the diagnosis/risks/treatment options with the patient and family and believe the pt to be eligible for discharge home to follow-up with PCP. We also discussed returning to the ED immediately if new or worsening sx occur. We discussed the sx which are most concerning (e.g., sudden worsening pain, fever, inability to tolerate by mouth) that necessitate immediate return. Medications administered to the patient during their visit and any new prescriptions provided to the patient are listed below.  Medications given during this visit Medications  morphine (MSIR) tablet 15 mg (has no administration in time range)  acetaminophen (TYLENOL) tablet 1,000 mg (has no administration in time range)  ketorolac (TORADOL) injection 15 mg (has no administration in time range)      The patient appears reasonably screen and/or stabilized for discharge and I  doubt any other medical condition or other [facility] requiring further screening, evaluation, or treatment in the ED at this time prior to discharge.   Final Clinical Impressions(s) / ED Diagnoses   Final diagnoses:  Disseminated herpes zoster    ED Discharge Orders         Ordered    morphine (MSIR) 15 MG tablet  Every 4 hours PRN     09/27/18 1243           Melene Plan, DO 09/27/18 1248

## 2018-09-27 NOTE — Discharge Instructions (Addendum)

## 2018-09-27 NOTE — ED Triage Notes (Signed)
Patient presents with complaints of pain related to Shingles. Reports that she was told to return if pain did not improved. Crusted over scabs present in lower back and buttock.

## 2018-10-18 ENCOUNTER — Encounter: Payer: Self-pay | Admitting: Infectious Diseases

## 2018-10-18 ENCOUNTER — Ambulatory Visit (INDEPENDENT_AMBULATORY_CARE_PROVIDER_SITE_OTHER): Payer: Medicare Other | Admitting: Infectious Diseases

## 2018-10-18 VITALS — BP 125/74 | HR 86 | Temp 98.0°F | Ht 61.0 in | Wt 144.0 lb

## 2018-10-18 DIAGNOSIS — R202 Paresthesia of skin: Secondary | ICD-10-CM | POA: Diagnosis not present

## 2018-10-18 DIAGNOSIS — F329 Major depressive disorder, single episode, unspecified: Secondary | ICD-10-CM | POA: Diagnosis not present

## 2018-10-18 DIAGNOSIS — F32A Depression, unspecified: Secondary | ICD-10-CM | POA: Insufficient documentation

## 2018-10-18 DIAGNOSIS — R21 Rash and other nonspecific skin eruption: Secondary | ICD-10-CM | POA: Diagnosis not present

## 2018-10-18 DIAGNOSIS — R200 Anesthesia of skin: Secondary | ICD-10-CM | POA: Diagnosis not present

## 2018-10-18 DIAGNOSIS — M069 Rheumatoid arthritis, unspecified: Secondary | ICD-10-CM | POA: Insufficient documentation

## 2018-10-18 DIAGNOSIS — Z8619 Personal history of other infectious and parasitic diseases: Secondary | ICD-10-CM

## 2018-10-18 HISTORY — DX: Personal history of other infectious and parasitic diseases: Z86.19

## 2018-10-18 MED ORDER — TRIAMCINOLONE ACETONIDE 0.1 % EX LOTN: 1.0000 "application " | TOPICAL_LOTION | Freq: Three times a day (TID) | CUTANEOUS | 0 refills | Status: DC

## 2018-10-18 MED ORDER — MUPIROCIN CALCIUM 2 % EX CREA: 1.0000 "application " | TOPICAL_CREAM | Freq: Two times a day (BID) | CUTANEOUS | 0 refills | Status: DC

## 2018-10-18 MED ORDER — GABAPENTIN 100 MG PO CAPS: 100.0000 mg | ORAL_CAPSULE | Freq: Three times a day (TID) | ORAL | 0 refills | Status: DC

## 2018-10-18 NOTE — Assessment & Plan Note (Addendum)
Papular, ulcerated scattered rash over buttocks and gluteal cleft, upper central back and isolated ulceration on mons pubis. Some ulcerations have rolled edges, no areas look overtly infected today. Left lateral leg with papular red rash, dry flaky skin and excoriations. Discussed cleansing the lesions with gentle soap and tepid water, avoid drying out skin. Will send in triamcinolone lotion and mupirocin cream to use on the areas to help with symptom management and healing. Delay in healing likely due to over-picking/scratching skin as well as methotrexate.   Differential for skin condition folliculitis with secondary ulceration, skin picking disorder, guttate psoriasis and based on pattern of distribution could consider scabies. Referral to dermatology placed for assistance.

## 2018-10-18 NOTE — Patient Instructions (Addendum)
I don't think this rash is due to shingles. It appears to be folliculitis or a kind of psoriasis to me - no signs of infection but will give you a topical antibiotic cream to try applying twice a day to see if it helps heal.   For your legs will send in a lotion to help with the itching.   I am not certain your burning/tingling is related to the rash - I would like to get you to see a dermatologist to help.   Please schedule an appointment with your primary care team when you are able.   Will refill your gabapentin for another 4 weeks since it is helping - you may need a higher dose or an alternative option to make you less sleepy

## 2018-10-18 NOTE — Assessment & Plan Note (Signed)
Chronic problem over the last 3 months. 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 She seems to have full sensation. She is not diabetic, current every day smoker. Does not drink alcohol. Will check Vitamin B12 and folate panel and refer back to her primary care provider for management.   Low dose gabapentin has been moderately effective for her but makes her very sleepy. She does not need follow up here at this time and I am hesitant to increase dose w/o monitoring. Will refill current dose as she is quite uncomfortable and refer back to primary care provider.

## 2018-10-18 NOTE — Progress Notes (Signed)
d     Patient: Erika [name]  DOB: April 06, 1956 MRN: [id number] PCP: Roselind Messier, PA-C  Referring Provider: referral from ER  Patient Active Problem List   Diagnosis Date Noted  . History of shingles 10/18/2018  . Rheumatoid arthritis (HCC) 10/18/2018  . Rash and nonspecific skin eruption 10/18/2018  . Numbness and tingling 10/18/2018  . Depression 10/18/2018     Subjective:  CC:  Shingles rash for 3 months not healing. Very itchy. Burning tingling pain in both feet. History of shingles in 2014.   HPI:  Erika [name] is a 62 y.o. female with past medical history of depression, rheumatoid arthritis, GERD, HTN.   She went to the ER on 09/23/18 after she was experiencing burning/numbness pain in both feet and crusted rash on her upper center back, bilateral buttocks and mons pubis. She was told this was shingles and that it was already crusted and too late for antiviral therapy. Given a course of gabapentin for pain relief. She returned 09/27/18 due to the pain - again noted to have same lesions unchanged, refilled her gabapentin and added morphine sulfate PO 15 mg for pain relief. She tells me that 5 years ago had her first zoster outbreak on her back down her left axilla. Three months ago she began experiencing shooting and burning pains/numbness in bilateral feet and legs up just above her ankles. One month later the rash appeared. She reports it is extremely itchy and she is constantly scratching the lesions. Nothing has improved the quality of these lesions. She lives with her son and he does not have any similar rash. No new medicines, soaps, detergents, lotions, personal care products. She is not taking her antidepressants routinely as they are not working for her, she is tearful in discussing this. She smokes cigarettes daily, does not drink, and is not a known diabetic. No fevers/chills/malaise/sweats. No worsened joint pain from her perception and feels RA is stable on current  therapy. She does report a family history of psoriasis in her sister.   Review of Systems  Constitutional: Negative for chills, fever, malaise/fatigue and weight loss.  HENT: Negative for sore throat.   Respiratory: Negative for cough and sputum production.   Cardiovascular: Negative for chest pain and leg swelling.  Gastrointestinal: Negative for abdominal pain, diarrhea and vomiting.  Genitourinary: Negative for dysuria.  Musculoskeletal: Negative for joint pain.  Skin: Positive for itching and rash.  Neurological: Positive for tingling. Negative for dizziness, weakness and headaches.  Psychiatric/Behavioral: Positive for depression. The patient has insomnia.     Past Medical History:  Diagnosis Date  . Arthritis   . Depression   . GERD (gastroesophageal reflux disease)   . History of shingles 10/18/2018  . Hypertension   . Rheumatoid arthritis(714.0)     Outpatient Medications Prior to Visit  Medication Sig Dispense Refill  . acetaminophen (TYLENOL) 325 MG tablet Take 2 tablets (650 mg total) by mouth every 6 (six) hours as needed. 30 tablet 0  . calcium-vitamin D (OSCAL WITH D) 500-200 MG-UNIT per tablet Take 1 tablet by mouth daily.    [name] escitalopram (LEXAPRO) 20 MG tablet Take 1 tablet (20 mg total) by mouth daily. 30 tablet 2  . ibuprofen (ADVIL,MOTRIN) 200 MG tablet Take 600-800 mg by mouth every 6 (six) hours as needed for pain.    [name] lisinopril (PRINIVIL,ZESTRIL) 10 MG tablet Take 1 tablet (10 mg total) by mouth daily. 30 tablet 2  . methotrexate (RHEUMATREX) 2.5 MG tablet Take  17.5 mg by mouth every 7 (seven) days. On Thursdays    . mirtazapine (REMERON) 15 MG tablet Take 1 tablet (15 mg total) by mouth at bedtime. 30 tablet 2  . morphine (MSIR) 15 MG tablet Take 1 tablet (15 mg total) by mouth every 4 (four) hours as needed for severe pain. 5 tablet 0  . oxyCODONE-acetaminophen (PERCOCET/ROXICET) 5-325 MG per tablet Take 1 tablet by mouth every 4 (four) hours as needed  for pain. 15 tablet 0  . pantoprazole (PROTONIX) 40 MG tablet Take 1 tablet (40 mg total) by mouth daily. 30 tablet 2  . cephALEXin (KEFLEX) 500 MG capsule Take 1 capsule (500 mg total) by mouth 4 (four) times daily. 28 capsule 0  . gabapentin (NEURONTIN) 100 MG capsule Take 1 capsule (100 mg total) by mouth 3 (three) times daily. 60 capsule 0   No facility-administered medications prior to visit.      No Known Allergies  Social History   Tobacco Use  . Smoking status: Current Every Day Smoker    Packs/day: 0.50    Types: Cigarettes  . Smokeless tobacco: Current User  Substance Use Topics  . Alcohol use: Yes    Comment: sometimes none in a week  . Drug use: Yes    Types: Marijuana    Comment: used last week    Family History  Problem Relation Age of Onset  . Psoriasis Sister     Objective:   Vitals:   10/18/18 1508  BP: 125/74  Pulse: 86  Temp: 98 F (36.7 C)  Weight: 144 lb (65.3 kg)  Height: 5\' 1"  (1.549 m)   Body mass index is 27.21 kg/m.  Physical Exam Vitals signs and nursing note reviewed.  Constitutional:      General: She is not in acute distress.    Appearance: She is well-developed.     Comments: Seated comfortably in chair. Appears in mild-moderate emotionally distressed d/t depression.   HENT:     Mouth/Throat:     Mouth: No oral lesions.     Dentition: Normal dentition. No dental abscesses.     Pharynx: No oropharyngeal exudate.  Neck:     Musculoskeletal: Normal range of motion.  Cardiovascular:     Rate and Rhythm: Normal rate and regular rhythm.     Pulses: Normal pulses.     Heart sounds: Normal heart sounds. No murmur.  Pulmonary:     Effort: Pulmonary effort is normal.     Breath sounds: Normal breath sounds.  Abdominal:     General: There is no distension.     Palpations: Abdomen is soft.     Tenderness: There is no abdominal tenderness.  Musculoskeletal:        General: No swelling. Tenderness: burning pain b/l feet.     Right  lower leg: No edema.     Left lower leg: No edema.  Lymphadenopathy:     Cervical: No cervical adenopathy.  Skin:    General: Skin is warm and dry.     Findings: Rash (buttocks, upper back pictured below. Single spot on mons pubis with ulcerated appearance. Papular rash with excoriations to left lateral shin with spotted ulcered areas. ) present.  Neurological:     Mental Status: She is alert and oriented to person, place, and time.  Psychiatric:        Judgment: Judgment normal.     Comments: In good spirits today and engaged in care discussion  Lab Results: Lab Results  Component Value Date   WBC 10.2 07/30/2010   HGB 11.5 (L) 07/30/2010   HCT 36.5 07/30/2010   MCV 85.9 07/30/2010   PLT 360 07/30/2010    Lab Results  Component Value Date   CREATININE 0.91 02/25/2010   BUN 16 02/25/2010   NA 138 02/25/2010   K 4.6 02/25/2010   CL 105 02/25/2010   CO2 17 (L) 02/25/2010    Lab Results  Component Value Date   ALT 10 02/02/2010   AST 13 02/02/2010   ALKPHOS 82 02/02/2010   BILITOT 0.4 02/02/2010     Assessment & Plan:   Problem List Items Addressed This Visit      Unprioritized   History of shingles    Reviewed ER visit from initial outbreak - rash was then described to be vesicular, linear, tracking down to left axilla from left upper back. Her rash today is not consistent with shingles.       Rheumatoid arthritis (HCC)   Rash and nonspecific skin eruption    Papular, ulcerated scattered rash over buttocks and gluteal cleft, upper central back and isolated ulceration on mons pubis. Some ulcerations have rolled edges, no areas look overtly infected today. Left lateral leg with papular red rash, dry flaky skin and excoriations. Discussed cleansing the lesions with gentle soap and tepid water, avoid drying out skin. Will send in triamcinolone lotion and mupirocin cream to use on the areas to help with symptom management and healing. Delay in healing likely  due to over-picking/scratching skin as well as methotrexate.   Differential for skin condition folliculitis with secondary ulceration, skin picking disorder, guttate psoriasis and based on pattern of distribution could consider scabies. Referral to dermatology placed for assistance.       Relevant Orders   Ambulatory referral to Dermatology   Numbness and tingling - Primary    Chronic problem over the last 3 months. 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 She seems to have full sensation. She is not diabetic, current every day smoker. Does not drink alcohol. Will check Vitamin B12 and folate panel and refer back to her primary care provider for management.   Low dose gabapentin has been moderately effective for her but makes her very sleepy. She does not need follow up here at this time and I am hesitant to increase dose w/o monitoring. Will refill current dose as she is quite uncomfortable and refer back to primary care provider.       Relevant Orders   B12 and Folate Panel   Depression    Has not been taking her Lexapro consistently as she feels it is not working. She is tearful today. I asked her to please consider making an appointment with her prescriber or consider psychiatry care. She agrees and will call.         Follow up with her primary care team and referral to dermatology.   Rexene Alberts, MSN, NP-C [facility] Pager: [phone] Office: [phone]  10/18/18  10:01 PM

## 2018-10-18 NOTE — Assessment & Plan Note (Signed)
Has not been taking her Lexapro consistently as she feels it is not working. She is tearful today. I asked her to please consider making an appointment with her prescriber or consider psychiatry care. She agrees and will call.

## 2018-10-18 NOTE — Assessment & Plan Note (Signed)
Reviewed ER visit from initial outbreak - rash was then described to be vesicular, linear, tracking down to left axilla from left upper back. Her rash today is not consistent with shingles.

## 2018-10-19 LAB — B12 AND FOLATE PANEL
FOLATE: 5.1 ng/mL — AB
VITAMIN B 12: 601 pg/mL (ref 200–1100)

## 2018-10-19 NOTE — Progress Notes (Signed)
Borderline low per our lab reference however considered normal in other literature references > 4. May consider adding a multivitamin to increase folic acid but not likely explanation of her lower leg paraesthesia. Follow up with PCP.

## 2018-11-09 ENCOUNTER — Telehealth: Payer: Self-pay

## 2018-11-09 NOTE — Telephone Encounter (Signed)
Received refiil request via fax for Gabpentin 100mg  cap take one tab three times a day. Refill request denied. Per Rexene Alberts NP last note patient was referred back to primary care provider for closer monitoring of low dose gabapentin.

## 2018-12-20 IMAGING — DX DG KNEE COMPLETE 4+V*L*
4 series · 4 of 4 positions shown · non-contrast
Comparison: 11/27/2013

CLINICAL DATA: Fell in kitchen today at 6666 hrs while carrying a
crock pot which shattered causing laceration RIGHT forearm, RIGHT
forearm and LEFT knee pain post fall

EXAM:
LEFT KNEE - COMPLETE 4+ VIEW

[knee ap]
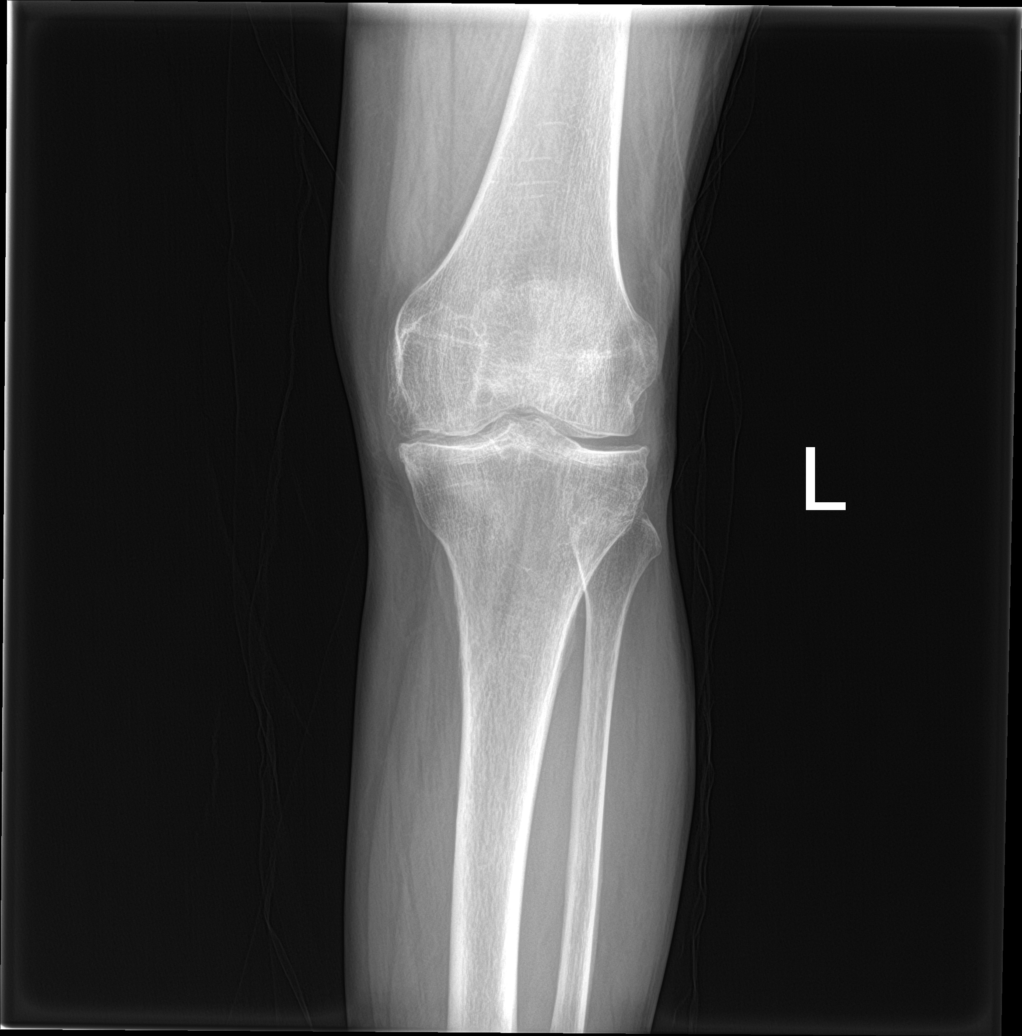

[knee lat]
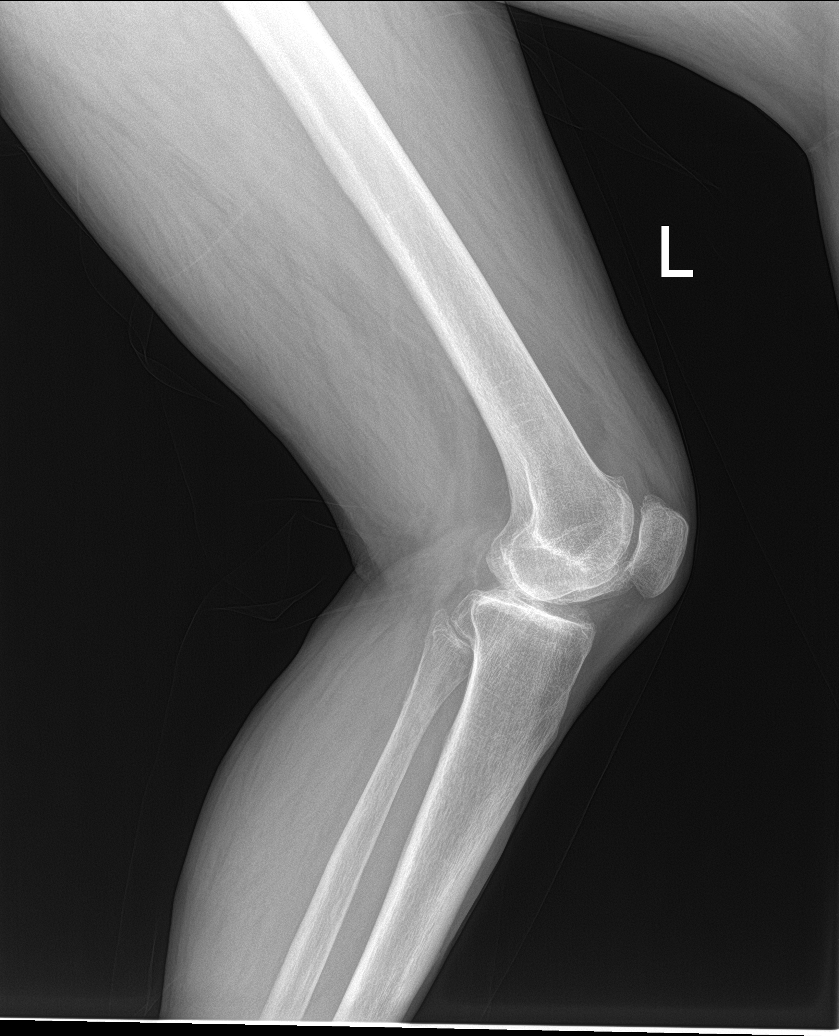

[knee obl (1 of 2)]
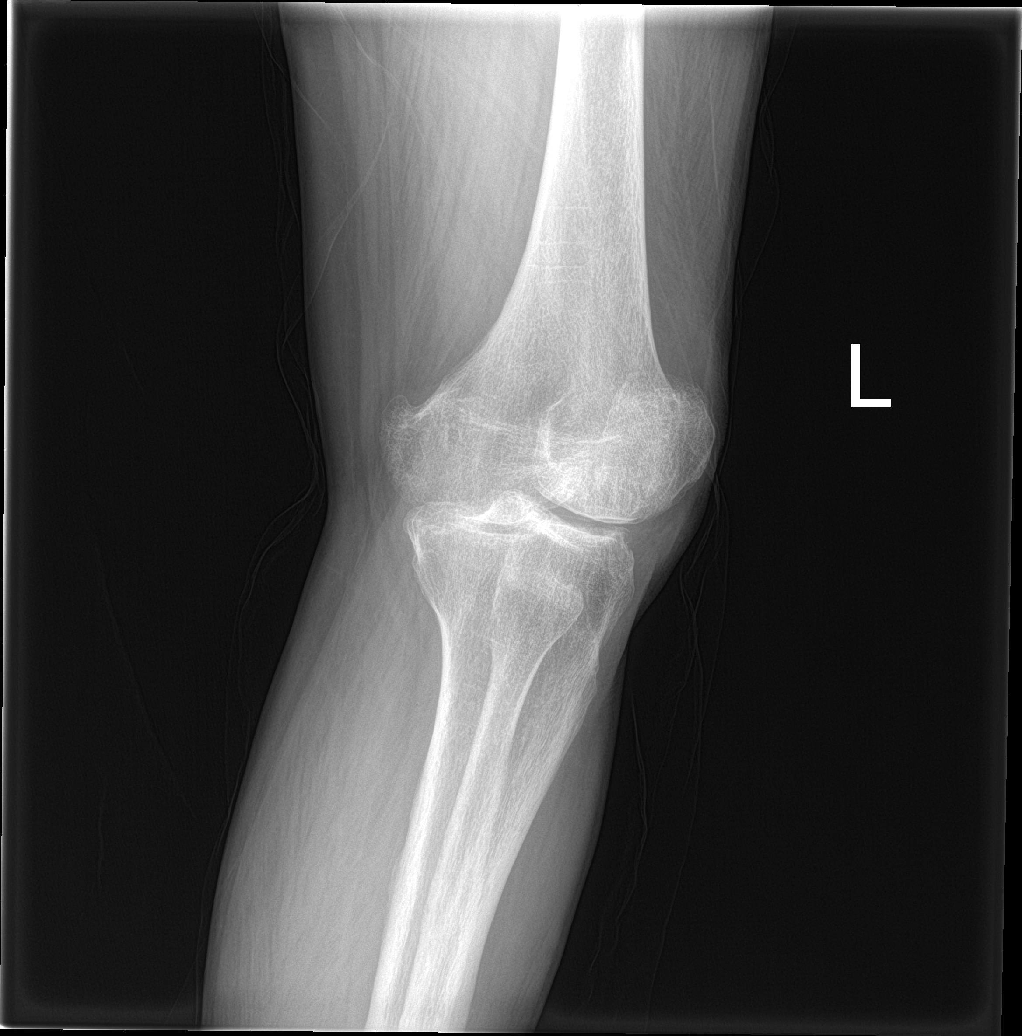

[knee obl (2 of 2)]
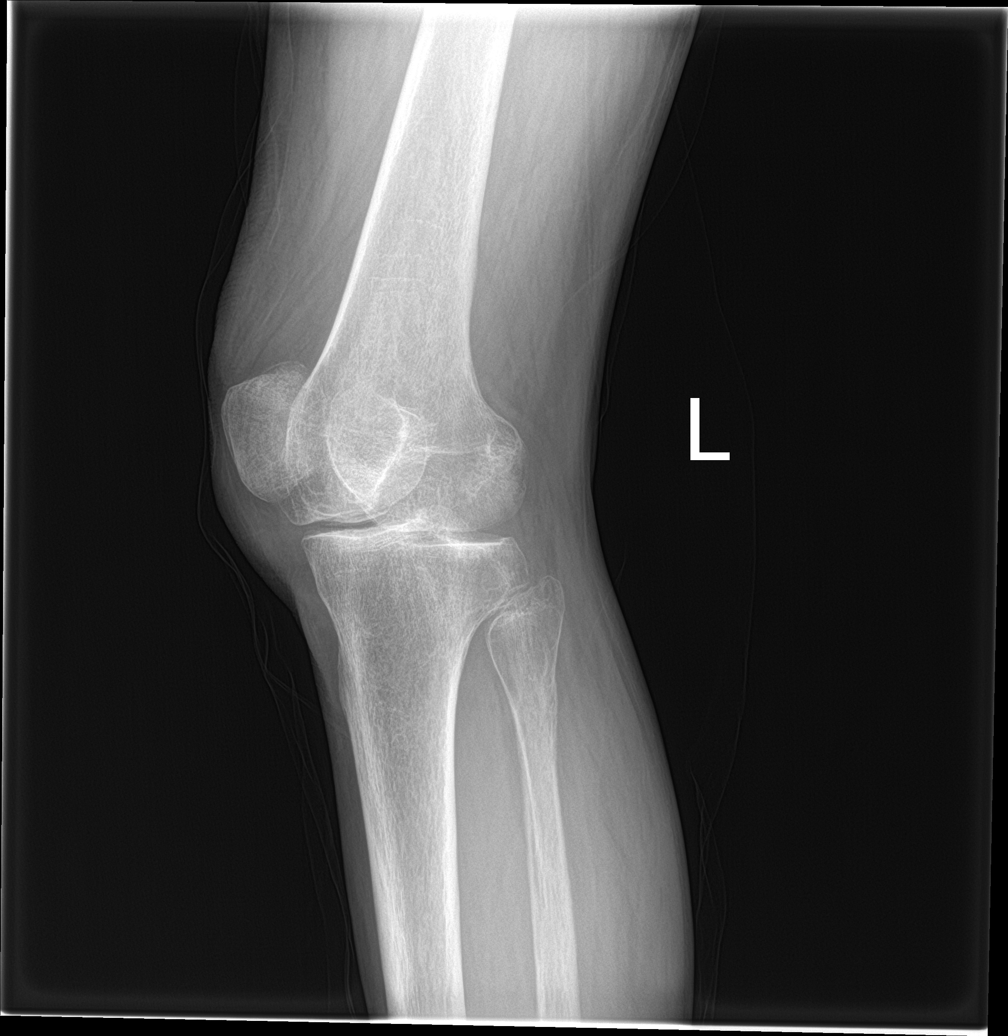

[4 of 4 positions shown; findings below may reference images not displayed]

FINDINGS: Osseous demineralization.

Tricompartmental osteoarthritic changes with joint space narrowing
and spur formation.

Minimal joint effusion.

No acute fracture, dislocation, or bone destruction.
IMPRESSION: Tricompartmental osteoarthritic changes and osseous
demineralization.

No acute osseous abnormalities.

## 2018-12-20 IMAGING — DX DG FOREARM 2V*R*
1 series · 2 of 2 positions shown · non-contrast
Comparison: 12/20/2017 at 0694 hours

CLINICAL DATA: Check for foreign bodies

EXAM:
RIGHT FOREARM - 2 VIEW

[Series 1: forearmbone · 0.14mm/px · 2 of 2 slices shown]
[im 1/2]
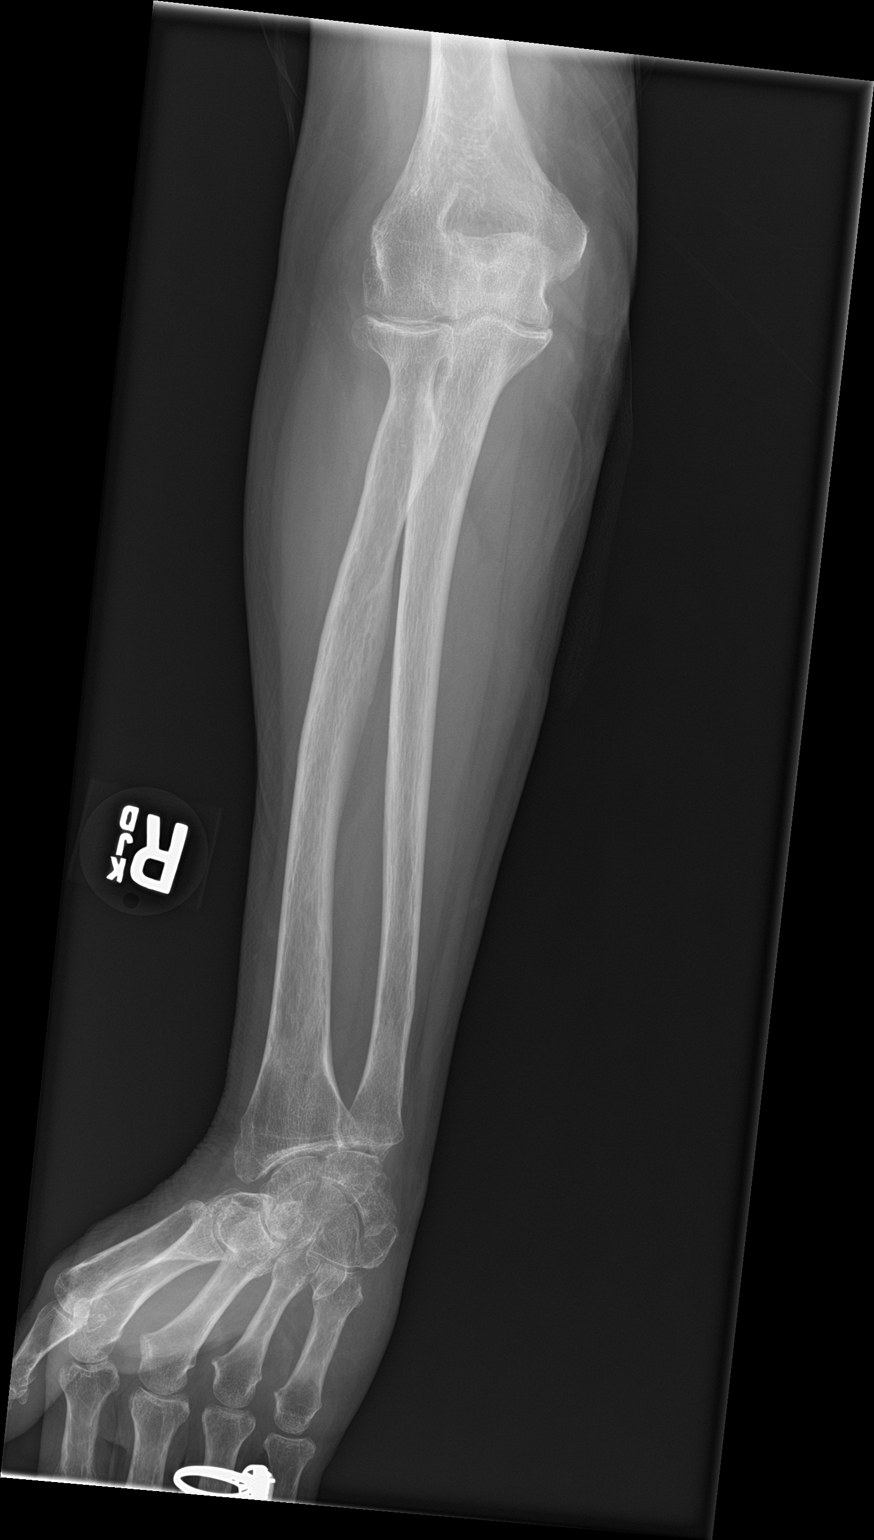
[im 2/2]
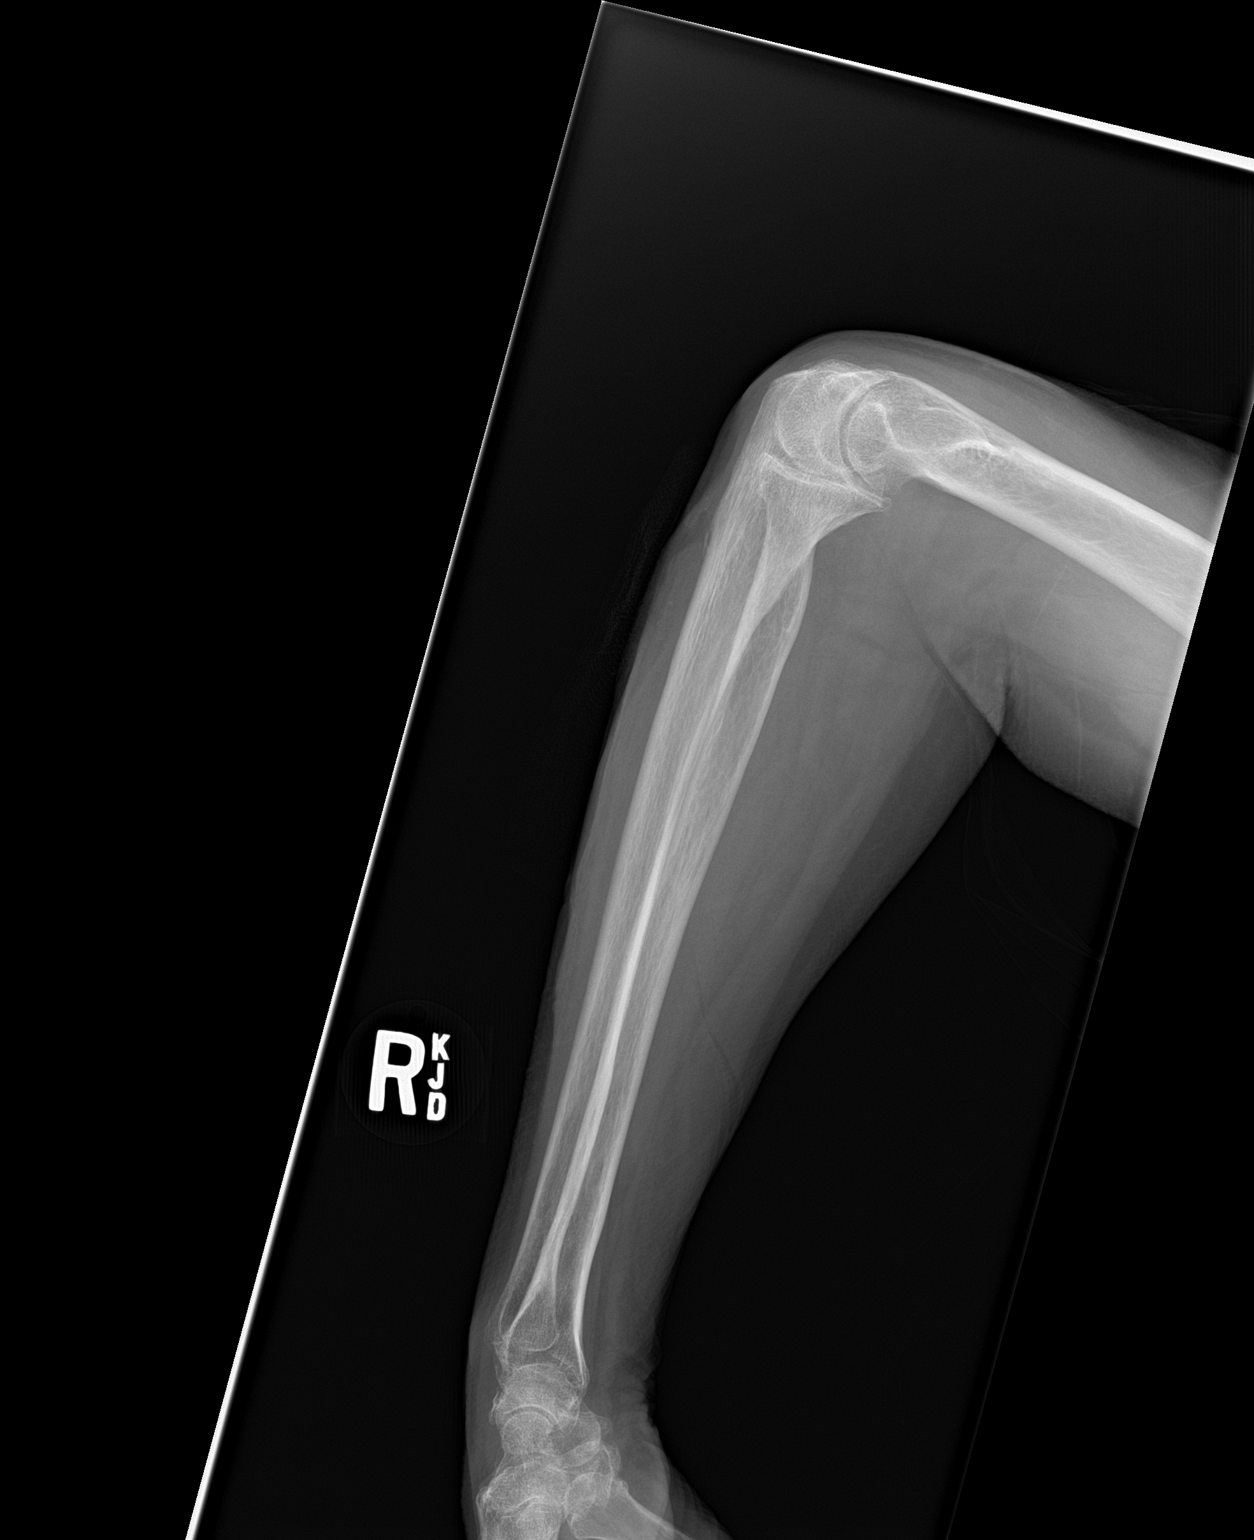

[2 of 2 positions shown; findings below may reference images not displayed]

FINDINGS: Interval cleansing of soft tissue wound along the dorsal proximal
aspect of the forearm. Punctate focus of soft tissue debris is noted
but the vast majority of soft tissue foreign bodies are no longer
present. No underlying osseous abnormality. Osteoarthritis of the
elbow and included wrist. No acute fracture.
IMPRESSION: 1. Interval cleansing of soft tissue wound along the dorsal proximal
aspect of the right forearm. The vast majority of soft tissue
foreign bodies are no longer present. There appears to be punctate
focus of soft tissue debris remaining.
2. No acute osseous abnormality.

## 2019-01-05 ENCOUNTER — Other Ambulatory Visit: Payer: Self-pay | Admitting: Adult Health

## 2019-01-05 ENCOUNTER — Other Ambulatory Visit: Payer: Self-pay | Admitting: Family Medicine

## 2019-01-05 DIAGNOSIS — Z1231 Encounter for screening mammogram for malignant neoplasm of breast: Secondary | ICD-10-CM

## 2019-02-08 ENCOUNTER — Ambulatory Visit: Payer: Medicare Other

## 2019-03-30 ENCOUNTER — Ambulatory Visit: Payer: Medicare Other

## 2019-05-18 ENCOUNTER — Ambulatory Visit
Admission: RE | Admit: 2019-05-18 | Discharge: 2019-05-18 | Disposition: A | Discharge status: Home / Self Care | Payer: Medicare Other | Source: Ambulatory Visit | Attending: Adult Health | Admitting: Adult Health

## 2019-05-18 ENCOUNTER — Other Ambulatory Visit: Payer: Self-pay

## 2019-05-18 DIAGNOSIS — Z1231 Encounter for screening mammogram for malignant neoplasm of breast: Secondary | ICD-10-CM

## 2020-10-02 ENCOUNTER — Other Ambulatory Visit: Payer: Self-pay | Admitting: Adult Health

## 2020-10-02 DIAGNOSIS — Z720 Tobacco use: Secondary | ICD-10-CM

## 2020-10-21 ENCOUNTER — Inpatient Hospital Stay: Admission: RE | Admit: 2020-10-21 | Payer: Medicare Other | Source: Ambulatory Visit

## 2021-04-23 ENCOUNTER — Emergency Department (HOSPITAL_COMMUNITY): Payer: Medicare (Managed Care)

## 2021-04-23 ENCOUNTER — Inpatient Hospital Stay (HOSPITAL_COMMUNITY): Payer: Medicare (Managed Care)

## 2021-04-23 ENCOUNTER — Other Ambulatory Visit: Payer: Self-pay

## 2021-04-23 ENCOUNTER — Encounter (HOSPITAL_COMMUNITY): Payer: Self-pay | Admitting: Pharmacy Technician

## 2021-04-23 ENCOUNTER — Inpatient Hospital Stay (HOSPITAL_COMMUNITY)
Admission: EM | Admit: 2021-04-23 | Discharge: 2021-04-26 | DRG: 504 | Disposition: A | Discharge status: Home / Self Care | Payer: Medicare (Managed Care) | Attending: Family Medicine | Admitting: Family Medicine

## 2021-04-23 DIAGNOSIS — Z8619 Personal history of other infectious and parasitic diseases: Secondary | ICD-10-CM

## 2021-04-23 DIAGNOSIS — R160 Hepatomegaly, not elsewhere classified: Secondary | ICD-10-CM

## 2021-04-23 DIAGNOSIS — Z9889 Other specified postprocedural states: Secondary | ICD-10-CM

## 2021-04-23 DIAGNOSIS — M868X7 Other osteomyelitis, ankle and foot: Secondary | ICD-10-CM | POA: Diagnosis present

## 2021-04-23 DIAGNOSIS — F319 Bipolar disorder, unspecified: Secondary | ICD-10-CM | POA: Diagnosis present

## 2021-04-23 DIAGNOSIS — R059 Cough, unspecified: Secondary | ICD-10-CM | POA: Diagnosis not present

## 2021-04-23 DIAGNOSIS — K219 Gastro-esophageal reflux disease without esophagitis: Secondary | ICD-10-CM | POA: Diagnosis present

## 2021-04-23 DIAGNOSIS — L02611 Cutaneous abscess of right foot: Secondary | ICD-10-CM | POA: Diagnosis present

## 2021-04-23 DIAGNOSIS — R7303 Prediabetes: Secondary | ICD-10-CM | POA: Diagnosis present

## 2021-04-23 DIAGNOSIS — M869 Osteomyelitis, unspecified: Secondary | ICD-10-CM

## 2021-04-23 DIAGNOSIS — Z9114 Patient's other noncompliance with medication regimen: Secondary | ICD-10-CM

## 2021-04-23 DIAGNOSIS — M069 Rheumatoid arthritis, unspecified: Secondary | ICD-10-CM | POA: Diagnosis present

## 2021-04-23 DIAGNOSIS — K7460 Unspecified cirrhosis of liver: Secondary | ICD-10-CM | POA: Diagnosis present

## 2021-04-23 DIAGNOSIS — Z720 Tobacco use: Secondary | ICD-10-CM | POA: Diagnosis not present

## 2021-04-23 DIAGNOSIS — M79671 Pain in right foot: Secondary | ICD-10-CM | POA: Diagnosis present

## 2021-04-23 DIAGNOSIS — L03031 Cellulitis of right toe: Secondary | ICD-10-CM | POA: Diagnosis present

## 2021-04-23 DIAGNOSIS — I10 Essential (primary) hypertension: Secondary | ICD-10-CM | POA: Diagnosis present

## 2021-04-23 DIAGNOSIS — F329 Major depressive disorder, single episode, unspecified: Secondary | ICD-10-CM | POA: Diagnosis not present

## 2021-04-23 DIAGNOSIS — F1010 Alcohol abuse, uncomplicated: Secondary | ICD-10-CM | POA: Diagnosis present

## 2021-04-23 DIAGNOSIS — D696 Thrombocytopenia, unspecified: Secondary | ICD-10-CM | POA: Diagnosis present

## 2021-04-23 DIAGNOSIS — G629 Polyneuropathy, unspecified: Secondary | ICD-10-CM | POA: Diagnosis present

## 2021-04-23 DIAGNOSIS — Z791 Long term (current) use of non-steroidal anti-inflammatories (NSAID): Secondary | ICD-10-CM

## 2021-04-23 DIAGNOSIS — M86171 Other acute osteomyelitis, right ankle and foot: Secondary | ICD-10-CM | POA: Diagnosis not present

## 2021-04-23 DIAGNOSIS — Z79899 Other long term (current) drug therapy: Secondary | ICD-10-CM | POA: Diagnosis not present

## 2021-04-23 DIAGNOSIS — F32A Depression, unspecified: Secondary | ICD-10-CM | POA: Diagnosis present

## 2021-04-23 DIAGNOSIS — Z28310 Unvaccinated for covid-19: Secondary | ICD-10-CM

## 2021-04-23 DIAGNOSIS — F17210 Nicotine dependence, cigarettes, uncomplicated: Secondary | ICD-10-CM | POA: Diagnosis present

## 2021-04-23 DIAGNOSIS — Z20822 Contact with and (suspected) exposure to covid-19: Secondary | ICD-10-CM | POA: Diagnosis present

## 2021-04-23 LAB — CBC WITH DIFFERENTIAL/PLATELET
Abs Immature Granulocytes: 0.01 10*3/uL (ref 0.00–0.07)
Basophils Absolute: 0 10*3/uL (ref 0.0–0.1)
Basophils Relative: 0 %
Eosinophils Absolute: 0 10*3/uL (ref 0.0–0.5)
Eosinophils Relative: 0 %
HCT: 40.1 % (ref 36.0–46.0)
Hemoglobin: 13.9 g/dL (ref 12.0–15.0)
Immature Granulocytes: 0 %
Lymphocytes Relative: 21 %
Lymphs Abs: 0.7 10*3/uL (ref 0.7–4.0)
MCH: 33.6 pg (ref 26.0–34.0)
MCHC: 34.7 g/dL (ref 30.0–36.0)
MCV: 96.9 fL (ref 80.0–100.0)
Monocytes Absolute: 0.4 10*3/uL (ref 0.1–1.0)
Monocytes Relative: 12 %
Neutro Abs: 2.1 10*3/uL (ref 1.7–7.7)
Neutrophils Relative %: 67 %
Platelets: 64 10*3/uL — ABNORMAL LOW (ref 150–400)
RBC: 4.14 MIL/uL (ref 3.87–5.11)
RDW: 13.8 % (ref 11.5–15.5)
WBC: 3.2 10*3/uL — ABNORMAL LOW (ref 4.0–10.5)
nRBC: 0 % (ref 0.0–0.2)

## 2021-04-23 LAB — COMPREHENSIVE METABOLIC PANEL
ALT: 19 U/L (ref 0–44)
AST: 28 U/L (ref 15–41)
Albumin: 2.9 g/dL — ABNORMAL LOW (ref 3.5–5.0)
Alkaline Phosphatase: 101 U/L (ref 38–126)
Anion gap: 6 (ref 5–15)
BUN: 7 mg/dL — ABNORMAL LOW (ref 8–23)
CO2: 26 mmol/L (ref 22–32)
Calcium: 8.2 mg/dL — ABNORMAL LOW (ref 8.9–10.3)
Chloride: 104 mmol/L (ref 98–111)
Creatinine, Ser: 0.83 mg/dL (ref 0.44–1.00)
GFR, Estimated: >60 mL/min (ref >60)
Glucose, Bld: 192 mg/dL — ABNORMAL HIGH (ref 70–99)
Potassium: 3.7 mmol/L (ref 3.5–5.1)
Sodium: 136 mmol/L (ref 135–145)
Total Bilirubin: 1 mg/dL (ref 0.3–1.2)
Total Protein: 6.5 g/dL (ref 6.5–8.1)

## 2021-04-23 LAB — LACTIC ACID, PLASMA: Lactic Acid, Venous: 1.4 mmol/L (ref 0.5–1.9)

## 2021-04-23 MED ORDER — METRONIDAZOLE 500 MG/100ML IV SOLN
500.0000 mg | Freq: Three times a day (TID) | INTRAVENOUS | Status: DC
  Filled 2021-04-23 (×2): qty 100

## 2021-04-23 MED ORDER — VANCOMYCIN HCL 1250 MG/250ML IV SOLN
1250.0000 mg | Freq: Once | INTRAVENOUS | Status: AC
  Administered 2021-04-24: 1250 mg via INTRAVENOUS
  Filled 2021-04-23: qty 250

## 2021-04-23 MED ORDER — FOLIC ACID 1 MG PO TABS
1.0000 mg | ORAL_TABLET | Freq: Every day | ORAL | Status: DC
  Administered 2021-04-24 – 2021-04-26 (×3): 1 mg via ORAL
  Filled 2021-04-23 (×3): qty 1

## 2021-04-23 MED ORDER — NICOTINE 21 MG/24HR TD PT24
21.0000 mg | MEDICATED_PATCH | Freq: Every day | TRANSDERMAL | Status: DC
  Administered 2021-04-24 – 2021-04-26 (×3): 21 mg via TRANSDERMAL
  Filled 2021-04-23 (×3): qty 1

## 2021-04-23 MED ORDER — MORPHINE SULFATE (PF) 4 MG/ML IV SOLN
4.0000 mg | Freq: Once | INTRAVENOUS | Status: AC
  Administered 2021-04-23: 4 mg via INTRAVENOUS
  Filled 2021-04-23: qty 1

## 2021-04-23 MED ORDER — LORAZEPAM 1 MG PO TABS
1.0000 mg | ORAL_TABLET | ORAL | Status: DC | PRN
  Administered 2021-04-25: 1 mg via ORAL
  Filled 2021-04-23: qty 1

## 2021-04-23 MED ORDER — LORAZEPAM 2 MG/ML IJ SOLN
1.0000 mg | INTRAMUSCULAR | Status: DC | PRN
  Administered 2021-04-24: 1 mg via INTRAVENOUS
  Filled 2021-04-23: qty 1

## 2021-04-23 MED ORDER — SODIUM CHLORIDE 0.9 % IV SOLN
2.0000 g | INTRAVENOUS | Status: DC
  Filled 2021-04-23 (×2): qty 20

## 2021-04-23 MED ORDER — INSULIN ASPART 100 UNIT/ML IJ SOLN
0.0000 [IU] | INTRAMUSCULAR | Status: DC
  Administered 2021-04-24: 1 [IU] via SUBCUTANEOUS
  Administered 2021-04-25: 2 [IU] via SUBCUTANEOUS
  Administered 2021-04-25: 3 [IU] via SUBCUTANEOUS

## 2021-04-23 MED ORDER — THIAMINE HCL 100 MG/ML IJ SOLN: 100.0000 mg | Freq: Every day | INTRAMUSCULAR | Status: DC

## 2021-04-23 MED ORDER — THIAMINE HCL 100 MG PO TABS
100.0000 mg | ORAL_TABLET | Freq: Every day | ORAL | Status: DC
  Administered 2021-04-24 – 2021-04-26 (×3): 100 mg via ORAL
  Filled 2021-04-23 (×3): qty 1

## 2021-04-23 MED ORDER — VANCOMYCIN HCL 750 MG/150ML IV SOLN
750.0000 mg | INTRAVENOUS | Status: DC
  Administered 2021-04-24 – 2021-04-25 (×2): 750 mg via INTRAVENOUS
  Filled 2021-04-23 (×3): qty 150

## 2021-04-23 MED ORDER — SODIUM CHLORIDE 0.9 % IV SOLN
2.0000 g | Freq: Three times a day (TID) | INTRAVENOUS | Status: DC
  Administered 2021-04-23 – 2021-04-26 (×8): 2 g via INTRAVENOUS
  Filled 2021-04-23 (×8): qty 2

## 2021-04-23 MED ORDER — ADULT MULTIVITAMIN W/MINERALS CH
1.0000 | ORAL_TABLET | Freq: Every day | ORAL | Status: DC
  Administered 2021-04-24 – 2021-04-26 (×3): 1 via ORAL
  Filled 2021-04-23 (×3): qty 1

## 2021-04-23 NOTE — Progress Notes (Addendum)
Pharmacy Antibiotic Note  Erika [name] is a 65 y.o. female admitted on 04/23/2021 with  suspected diabetic foot infection .  Pharmacy has been consulted for vancomycin dosing.  SCr wnl, WBC low, LA 1.4   Plan: -Vancomycin 1250 mg IV load followed by Vancomycin 750 mg IV Q 24 hrs. Goal AUC 400-550. Expected AUC: 493 SCr used: 0.83 -Ceftriaxone per MD -Monitor CBC, renal fx, cultures and clinical progress -Vanc levels as indicated       Temp (24hrs), Avg:98.6 F (37 C), Min:98.6 F (37 C), Max:98.6 F (37 C)  Recent Labs  Lab 04/23/21 1600 04/23/21 1601  WBC 3.2*  --   CREATININE 0.83  --   LATICACIDVEN  --  1.4    CrCl cannot be calculated (Unknown ideal weight.).    No Known Allergies  Antimicrobials this admission: Ceftriaxone 6/23 >>  Vancomycin 6/23 >>   Dose adjustments this admission:   Microbiology results: 6/23 BCx:    Thank you for allowing pharmacy to be a part of this patient's care.  Vinnie Level, PharmD., BCPS, BCCCP Clinical Pharmacist Please refer to [facility] for unit-specific pharmacist   Addendum: Now broadening ceftriaxone to Cefepime. Will d/c ceftriaxone and start Cefepime 2 gm IV Q 8 hours.   Vinnie Level, PharmD., BCPS, BCCCP Clinical Pharmacist Please refer to [facility] for unit-specific pharmacist

## 2021-04-23 NOTE — ED Provider Notes (Signed)
Emergency Medicine Provider Triage Evaluation Note  Erika [name] , a 65 y.o. female  was evaluated in triage.  Pt complains of right foot pain.  She states that about 2 weeks ago she has had swelling in her right foot.  She states that she is not having any fevers and otherwise feels well.  She denies any injury..  Review of Systems  Positive: Right foot wound, redness, pain Negative: Fevers  Physical Exam  BP 119/64   Pulse 66   Temp 98.6 F (37 C) (Oral)   Resp 16   SpO2 98%  Gen:   Awake, no distress   Resp:  Normal effort  MSK:   Moves extremities without difficulty  Other:  Right foot is obviously erythematous and swollen.  The right small toe has a wound on the medial aspect distally that appears to be possibly the bone eroded through the skin.  There is significant discoloration of the right great toe.  2+ right DP/PT pulse.  Medical Decision Making  Medically screening exam initiated at 4:00 PM.  Appropriate orders placed.  [name] was informed that the remainder of the evaluation will be completed by another provider, this initial triage assessment does not replace that evaluation, and the importance of remaining in the ED until their evaluation is complete.     Cristina Gong, PA-C 04/23/21 1602    Tilden Fossa, MD 04/24/21 [phone]

## 2021-04-23 NOTE — ED Notes (Signed)
Patient transported to MRI 

## 2021-04-23 NOTE — ED Triage Notes (Signed)
Pt here with R foot pain onset approx 2 weeks ago. Denies injury. Pt reports swelling has gotten better.

## 2021-04-23 NOTE — ED Provider Notes (Signed)
[facility] EMERGENCY DEPARTMENT Provider Note   CSN: [id number] Arrival date & time: 04/23/21  1502     History Chief Complaint  Patient presents with   Foot Pain    Erika [name] is a 65 y.o. female presenting to ED with right toe pain.  She reports she has peripheral neuropathy but denies hx of diabetes.  She noted a blister in her 5th toe 2 weeks ago that she tried to "pop" last week.  She now has pain , warmth and redness spreading up her right foot.  Denies fevers or chills.  NKDA.  HPI     Past Medical History:  Diagnosis Date   Arthritis    Depression    GERD (gastroesophageal reflux disease)    History of shingles 10/18/2018   Hypertension    Rheumatoid arthritis(714.0)     Patient Active Problem List   Diagnosis Date Noted   History of shingles 10/18/2018   Rheumatoid arthritis (HCC) 10/18/2018   Rash and nonspecific skin eruption 10/18/2018   Numbness and tingling 10/18/2018   Depression 10/18/2018    Past Surgical History:  Procedure Laterality Date   BREAST REDUCTION SURGERY     REDUCTION MAMMAPLASTY Bilateral      OB History     Gravida  2   Para  2   Term  2   Preterm      AB      Living  2      SAB      IAB      Ectopic      Multiple      Live Births              Family History  Problem Relation Age of Onset   Psoriasis Sister     Social History   Tobacco Use   Smoking status: Every Day    Packs/day: 0.50    Pack years: 0.00    Types: Cigarettes   Smokeless tobacco: Current  Vaping Use   Vaping Use: Never used  Substance Use Topics   Alcohol use: Yes    Comment: sometimes none in a week   Drug use: Yes    Types: Marijuana    Comment: used last week    Home Medications Prior to Admission medications   Medication Sig Start Date End Date Taking? Authorizing Provider  acetaminophen (TYLENOL) 325 MG tablet Take 2 tablets (650 mg total) by mouth every 6 (six) hours as needed. 11/02/17    Petrucelli, Samantha R, PA-C  calcium-vitamin D (OSCAL WITH D) 500-200 MG-UNIT per tablet Take 1 tablet by mouth daily.    [provider]  escitalopram (LEXAPRO) 20 MG tablet Take 1 tablet (20 mg total) by mouth daily. 11/21/12   Johnson, Clanford L, MD  gabapentin (NEURONTIN) 100 MG capsule Take 1 capsule (100 mg total) by mouth 3 (three) times daily. 10/18/18 11/17/18  Blanchard Kelch, NP  ibuprofen (ADVIL,MOTRIN) 200 MG tablet Take 600-800 mg by mouth every 6 (six) hours as needed for pain. 12/25/12   Dhungel, Theda Belfast, MD  lisinopril (PRINIVIL,ZESTRIL) 10 MG tablet Take 1 tablet (10 mg total) by mouth daily. 11/21/12   Johnson, Clanford L, MD  methotrexate (RHEUMATREX) 2.5 MG tablet Take 17.5 mg by mouth every 7 (seven) days. On Thursdays    [provider]  mirtazapine (REMERON) 15 MG tablet Take 1 tablet (15 mg total) by mouth at bedtime. 11/21/12   Cleora Fleet, MD  morphine (  MSIR) 15 MG tablet Take 1 tablet (15 mg total) by mouth every 4 (four) hours as needed for severe pain. 09/27/18   Melene Plan, DO  mupirocin cream (BACTROBAN) 2 % Apply 1 application topically 2 (two) times daily. 10/18/18   Blanchard Kelch, NP  oxyCODONE-acetaminophen (PERCOCET/ROXICET) 5-325 MG per tablet Take 1 tablet by mouth every 4 (four) hours as needed for pain. 07/09/13   Rubye Oaks, Janene Harvey, PA-C  pantoprazole (PROTONIX) 40 MG tablet Take 1 tablet (40 mg total) by mouth daily. 11/21/12   Johnson, Clanford L, MD  triamcinolone lotion (KENALOG) 0.1 % Apply 1 application topically 3 (three) times daily. 10/18/18   Blanchard Kelch, NP    Allergies    Patient has no known allergies.  Review of Systems   Review of Systems  Constitutional:  Negative for chills and fever.  HENT:  Negative for ear pain and sore throat.   Eyes:  Negative for pain and visual disturbance.  Respiratory:  Negative for cough and shortness of breath.   Cardiovascular:  Negative for chest pain and palpitations.   Gastrointestinal:  Negative for abdominal pain and vomiting.  Genitourinary:  Negative for dysuria and hematuria.  Musculoskeletal:  Positive for arthralgias and myalgias.  Skin:  Positive for rash and wound.  Neurological:  Negative for seizures and syncope.  All other systems reviewed and are negative.  Physical Exam Updated Vital Signs BP 138/74   Pulse (!) 58   Temp 98.6 F (37 C) (Oral)   Resp 14   SpO2 100%   Physical Exam Constitutional:      General: She is not in acute distress. HENT:     Head: Normocephalic and atraumatic.  Eyes:     Conjunctiva/sclera: Conjunctivae normal.     Pupils: Pupils are equal, round, and reactive to light.  Cardiovascular:     Rate and Rhythm: Normal rate and regular rhythm.     Pulses: Normal pulses.  Pulmonary:     Effort: Pulmonary effort is normal. No respiratory distress.  Abdominal:     General: There is no distension.     Tenderness: There is no abdominal tenderness.  Musculoskeletal:     Comments: Necrosis of 5th distal toe, right Discoloration of 5th right toe Erythema and warmth streaking to mid foot right  Skin:    General: Skin is warm and dry.  Neurological:     General: No focal deficit present.     Mental Status: She is alert. Mental status is at baseline.     Comments: Baseline paresthesias in bilateral feet   Psychiatric:        Mood and Affect: Mood normal.        Behavior: Behavior normal.      ED Results / Procedures / Treatments   Labs (all labs ordered are listed, but only abnormal results are displayed) Labs Reviewed  CBC WITH DIFFERENTIAL/PLATELET - Abnormal; Notable for the following components:      Result Value   WBC 3.2 (*)    Platelets 64 (*)    All other components within normal limits  COMPREHENSIVE METABOLIC PANEL - Abnormal; Notable for the following components:   Glucose, Bld 192 (*)    BUN 7 (*)    Calcium 8.2 (*)    Albumin 2.9 (*)    All other components within normal limits   LACTIC ACID, PLASMA  LACTIC ACID, PLASMA    EKG None  Radiology DG Foot Complete Right  Result Date: 04/23/2021  CLINICAL DATA:  Foot wound. Right foot pain and swelling. Fifth toe is oozing and black. EXAM: RIGHT FOOT COMPLETE - 3+ VIEW COMPARISON:  None. FINDINGS: Destruction in decreased density of the fifth toe distal phalanx as well as distal aspect of the middle phalanx consistent with osteomyelitis. There is slight decreased density of the lateral aspect of the fifth metatarsal head that is nonspecific. No fracture. 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 Soft tissue edema about the dorsum of the foot and fifth toe. No definite soft tissue air. No radiopaque foreign body. IMPRESSION: 1. Findings consistent with osteomyelitis of the fifth toe distal and middle phalanges. 2. Slight decreased density of the lateral aspect of the fifth metatarsal head is nonspecific, possible additional site of osteomyelitis. 3. Soft tissue edema without soft tissue air or radiopaque foreign body. Electronically Signed   By: Narda Rutherford M.D.   On: 04/23/2021 16:40    Procedures Procedures   Medications Ordered in ED Medications - No data to display  ED Course  I have reviewed the triage vital signs and the nursing notes.  Pertinent labs & imaging results that were available during my care of the patient were reviewed by me and considered in my medical decision making (see chart for details).  65 yo female here with foot infection Xray reviewed - concerning for osteomyelitis.  Clinically this is consistent with her presentation with a necrotic foot wound  No crepitus or free air on films to suggest nec fasc WBC 3.2.  Otherwise does not meet sepsis criteria.  Lactate is normal.   IV morphine ordered for pain  IV antibiotics ordered per ED order set, Vanco + Rocephin + Flagyl  MRI foot ordered  Patient to be admitted to hospitalist service, stable  at this time  Clinical Course as of 04/24/21 1009  Thu Apr 23, 2021  2225 Admitted to hospitalist [MT]    Clinical Course User Index [MT] Latrise Bowland, Kermit Balo, MD    Final Clinical Impression(s) / ED Diagnoses Osteomyelitis of right foot   Rx / DC Orders ED Discharge Orders     None        Terald Sleeper, MD 04/24/21 1015

## 2021-04-23 NOTE — H&P (Addendum)
Erika [name] ZOX:[id number] DOB: 17-Jan-1956 DOA: 04/23/2021     PCP: Roselind Messier, PA-C   Outpatient Specialists:     Patient arrived to ER on 04/23/21 at 1502 Referred by Attending Trifan, Kermit Balo, MD   Patient coming from: home Lives  With family    Chief Complaint:   Chief Complaint  Patient presents with   Foot Pain    HPI: Erika [name] is a 65 y.o. female with medical history significant of HTN, Rheumatoid arthritis    Presented with  right foot pain started 2 wks ago had a blister that she popped. Since then foot has swollen up and became red.  No fevers no chest pain or shortness of breath Patient denies being a exposed to anybody with COVID .  She has not been vaccinated as of Read patient continues to smoke She drinks about 210 ounce drinks per day.  Her doctor has told her before that she has liver disease and she needs to quit drinking but she has not so far because she has a lot of stressors going on. She is interested in quitting drinking at some point. She has been told in the past that she was prediabetic but never fully diagnosed. Patient stopped taking any of her medications for rheumatoid arthritis she is no longer on prednisone or methotrexate because of insurance problems  iInfectious risk factors:  Reports dry cough,    Has   NOt been vaccinated against COVID     Initial COVID TEST   in house  PCR testing  Pending  No results found for: SARSCOV2NAA   Regarding pertinent Chronic problems:    HTN on no longer on lisinopril    Depression and bipolar disorder on lamictal and effexor   While in ER: Noted to have evidence of osteomyelitis on plain imaging MRI of the foot ordered     ED Triage Vitals [04/23/21 1515]  Enc Vitals Group     BP 119/64     Pulse Rate 66     Resp 16     Temp 98.6 F (37 C)     Temp Source Oral     SpO2 98 %     Weight      Height      Head Circumference      Peak Flow      Pain Score      Pain  Loc      Pain Edu?      Excl. in GC?   WJXB(14)@     _________________________________________ Significant initial  Findings: Abnormal Labs Reviewed  CBC WITH DIFFERENTIAL/PLATELET - Abnormal; Notable for the following components:      Result Value   WBC 3.2 (*)    Platelets 64 (*)    All other components within normal limits  COMPREHENSIVE METABOLIC PANEL - Abnormal; Notable for the following components:   Glucose, Bld 192 (*)    BUN 7 (*)    Calcium 8.2 (*)    Albumin 2.9 (*)    All other components within normal limits   ____________________________________________ Ordered  Findings consistent with osteomyelitis of the fifth toe distal and middle phalanges. _____   The recent clinical data is shown below. Vitals:   04/23/21 1515 04/23/21 1742 04/23/21 1902  BP: 119/64 110/69 138/74  Pulse: 66 (!) 59 (!) 58  Resp: 16 16 14   Temp: 98.6 F (37 C)    TempSrc: Oral    SpO2: 98% 96% 100%  WBC     Component Value Date/Time   WBC 3.2 (L) 04/23/2021 1600   LYMPHSABS 0.7 04/23/2021 1600   MONOABS 0.4 04/23/2021 1600   EOSABS 0.0 04/23/2021 1600   BASOSABS 0.0 04/23/2021 1600     Lactic Acid, Venous    Component Value Date/Time   LATICACIDVEN 1.4 04/23/2021 1601       UA  ordered     No results found for this or any previous visit.   _______________________________________________ Hospitalist was called for admission for osteomyelitis of right fifth toe  The following Work up has been ordered so far:  Orders Placed This Encounter  Procedures   Blood culture (routine x 2)   Resp Panel by RT-PCR (Flu A&B, Covid) Nasopharyngeal Swab   DG Foot Complete Right   MRI Right foot without contrast   CBC with Differential   Comprehensive metabolic panel   Lactic acid, plasma   Diet NPO time specified   Initiate Carrier Fluid Protocol   Consult to Peripheral Vascular Navigator   vancomycin per pharmacy consult   Consult for [facility] Medical Admission    Insert peripheral IV      Following Medications were ordered in ER: Medications  morphine 4 MG/ML injection 4 mg (has no administration in time range)  cefTRIAXone (ROCEPHIN) 2 g in sodium chloride 0.9 % 100 mL IVPB (has no administration in time range)  metroNIDAZOLE (FLAGYL) IVPB 500 mg (has no administration in time range)         OTHER Significant initial  Findings:  labs showing:    Recent Labs  Lab 04/23/21 1600  NA 136  K 3.7  CO2 26  GLUCOSE 192*  BUN 7*  CREATININE 0.83  CALCIUM 8.2*    Cr  * stable,  Up from baseline see below Lab Results  Component Value Date   CREATININE 0.83 04/23/2021   CREATININE 0.91 02/25/2010   CREATININE 0.91 02/02/2010    Recent Labs  Lab 04/23/21 1600  AST 28  ALT 19  ALKPHOS 101  BILITOT 1.0  PROT 6.5  ALBUMIN 2.9*   Lab Results  Component Value Date   CALCIUM 8.2 (L) 04/23/2021          Plt: Lab Results  Component Value Date   PLT 64 (L) 04/23/2021        Recent Labs  Lab 04/23/21 1600  WBC 3.2*  NEUTROABS 2.1  HGB 13.9  HCT 40.1  MCV 96.9  PLT 64*    HG/HCT stable,       Component Value Date/Time   HGB 13.9 04/23/2021 1600   HCT 40.1 04/23/2021 1600   MCV 96.9 04/23/2021 1600      DM  labs:  HbA1C: No results for input(s): HGBA1C in the last 8760 hours.     CBG (last 3)  No results for input(s): GLUCAP in the last 72 hours.        Cultures: No results found for: SDES, SPECREQUEST, CULT, REPTSTATUS   Radiological Exams on Admission: DG Foot Complete Right  Result Date: 04/23/2021 CLINICAL DATA:  Foot wound. Right foot pain and swelling. Fifth toe is oozing and black. EXAM: RIGHT FOOT COMPLETE - 3+ VIEW COMPARISON:  None. FINDINGS: Destruction in decreased density of the fifth toe distal phalanx as well as distal aspect of the middle phalanx consistent with osteomyelitis. There is slight decreased density of the lateral aspect of the fifth metatarsal head that is nonspecific. No  fracture. Mild osteoarthritis of the first metatarsal  phalangeal joint on throughout the midfoot. Soft tissue edema about the dorsum of the foot and fifth toe. No definite soft tissue air. No radiopaque foreign body. IMPRESSION: 1. Findings consistent with osteomyelitis of the fifth toe distal and middle phalanges. 2. Slight decreased density of the lateral aspect of the fifth metatarsal head is nonspecific, possible additional site of osteomyelitis. 3. Soft tissue edema without soft tissue air or radiopaque foreign body. Electronically Signed   By: Narda Rutherford M.D.   On: 04/23/2021 16:40   _______________________________________________________________________________________________________ Latest  Blood pressure 138/74, pulse (!) 58, temperature 98.6 F (37 C), temperature source Oral, resp. rate 14, SpO2 100 %.   Review of Systems:    Pertinent positives include:  fatigue, foot pain  Constitutional:  No weight loss, night sweats, Fevers, chills, weight loss  HEENT:  No headaches, Difficulty swallowing,Tooth/dental problems,Sore throat,  No sneezing, itching, ear ache, nasal congestion, post nasal drip,  Cardio-vascular:  No chest pain, Orthopnea, PND, anasarca, dizziness, palpitations.no Bilateral lower extremity swelling  GI:  No heartburn, indigestion, abdominal pain, nausea, vomiting, diarrhea, change in bowel habits, loss of appetite, melena, blood in stool, hematemesis Resp:  no shortness of breath at rest. No dyspnea on exertion, No excess mucus, no productive cough, No non-productive cough, No coughing up of blood.No change in color of mucus.No wheezing. Skin:  no rash or lesions. No jaundice GU:  no dysuria, change in color of urine, no urgency or frequency. No straining to urinate.  No flank pain.  Musculoskeletal:  No joint pain or no joint swelling. No decreased range of motion. No back pain.  Psych:  No change in mood or affect. No depression or anxiety. No memory  loss.  Neuro: no localizing neurological complaints, no tingling, no weakness, no double vision, no gait abnormality, no slurred speech, no confusion  All systems reviewed and apart from HOPI all are negative _______________________________________________________________________________________________ Past Medical History:   Past Medical History:  Diagnosis Date   Arthritis    Depression    GERD (gastroesophageal reflux disease)    History of shingles 10/18/2018   Hypertension    Rheumatoid arthritis(714.0)       Past Surgical History:  Procedure Laterality Date   BREAST REDUCTION SURGERY     REDUCTION MAMMAPLASTY Bilateral     Social History:  Ambulatory independently d     reports that she has been smoking cigarettes. She has been smoking an average of 0.50 packs per day. She uses smokeless tobacco. She reports current alcohol use. She reports current drug use. Drug: Marijuana.     Family History:   Family History  Problem Relation Age of Onset   Psoriasis Sister    ______________________________________________________________________________________________ Allergies: No Known Allergies   Prior to Admission medications   Medication Sig Start Date End Date Taking? Authorizing Provider  acetaminophen (TYLENOL) 325 MG tablet Take 2 tablets (650 mg total) by mouth every 6 (six) hours as needed. 11/02/17   Petrucelli, Samantha R, PA-C  calcium-vitamin D (OSCAL WITH D) 500-200 MG-UNIT per tablet Take 1 tablet by mouth daily.    [provider]  escitalopram (LEXAPRO) 20 MG tablet Take 1 tablet (20 mg total) by mouth daily. 11/21/12   Johnson, Clanford L, MD  gabapentin (NEURONTIN) 100 MG capsule Take 1 capsule (100 mg total) by mouth 3 (three) times daily. 10/18/18 11/17/18  Blanchard Kelch, NP  ibuprofen (ADVIL,MOTRIN) 200 MG tablet Take 600-800 mg by mouth every 6 (six) hours as needed for pain. 12/25/12  Dhungel, Nishant, MD  lisinopril (PRINIVIL,ZESTRIL)  10 MG tablet Take 1 tablet (10 mg total) by mouth daily. 11/21/12   Johnson, Clanford L, MD  methotrexate (RHEUMATREX) 2.5 MG tablet Take 17.5 mg by mouth every 7 (seven) days. On Thursdays    [provider]  mirtazapine (REMERON) 15 MG tablet Take 1 tablet (15 mg total) by mouth at bedtime. 11/21/12   Johnson, Clanford L, MD  morphine (MSIR) 15 MG tablet Take 1 tablet (15 mg total) by mouth every 4 (four) hours as needed for severe pain. 09/27/18   Melene Plan, DO  mupirocin cream (BACTROBAN) 2 % Apply 1 application topically 2 (two) times daily. 10/18/18   Blanchard Kelch, NP  oxyCODONE-acetaminophen (PERCOCET/ROXICET) 5-325 MG per tablet Take 1 tablet by mouth every 4 (four) hours as needed for pain. 07/09/13   Rubye Oaks, Janene Harvey, PA-C  pantoprazole (PROTONIX) 40 MG tablet Take 1 tablet (40 mg total) by mouth daily. 11/21/12   Johnson, Clanford L, MD  triamcinolone lotion (KENALOG) 0.1 % Apply 1 application topically 3 (three) times daily. 10/18/18   Blanchard Kelch, NP    ___________________________________________________________________________________________________ Physical Exam: Vitals with BMI 04/23/2021 04/23/2021 04/23/2021  Height - - -  Weight - - -  BMI - - -  Systolic 138 110 580  Diastolic 74 69 64  Pulse 58 59 66     1. General:  in No  Acute distress   Chronically ill    -appearing 2. Psychological: Alert and  Oriented 3. Head/ENT:    Dry Mucous Membranes                          Head Non traumatic, neck supple                        Poor Dentition 4. SKIN:  decreased Skin turgor,  Skin clean Dry Right foot with edema and redness   5. Heart: Regular rate and rhythm no Murmur, no Rub or gallop 6. Lungs: distant no wheezes or crackles   7. Abdomen: Soft,  non-tender, Non distended   obese  bowel sounds present hepatomegaly 8. Lower extremities: no clubbing, cyanosis, no  edema 9. Neurologically Grossly intact, moving all 4 extremities equally  no  tremour 10. MSK: Normal range of motion    Chart has been reviewed  ______________________________________________________________________________________________  Assessment/Plan  65 y.o. female with medical history significant of HTN, Rheumatoid arthritis   Admitted for osteomyelitis of fifth toe on the right  Present on Admission:  Osteomyelitis (HCC) -current cefepime and vancomycin.  Podiatry aware.  N.p.o. after 8 AM for possible procedure at 4 PM. Of note thrombocytopenia order type and screen in case patient may need to transfuse prior to procedure.   Rheumatoid arthritis (HCC) -chronic stable currently not on Agents  Depression -stable continue home medication    Alcohol abuse order CIWA protocol spoke about importance of quitting   Hepatomegaly -order right upper quadrant about order hepatitis serologies   Thrombocytopenia (HCC) -suspect possibly secondary to cirrhosis undiagnosed.  Obtain right upper quad ultrasound other differential includes alcohol abuse.  May need further work-up if above work-up is negative  US showing steatosis - will email hematology for recomendations in AM   GERD (gastroesophageal reflux disease) resume PPI when able   Tobacco abuse-  - Spoke about importance of quitting spent 5 minutes discussing options for treatment, prior attempts at quitting, and dangers of  smoking  -At this point patient is     interested in quitting  - order nicotine patch   - nursing tobacco cessation protocol    Other plan as per orders.  DVT prophylaxis:  SCD       Code Status:    Code Status: Not on file FULL CODE as per patient  I had personally discussed CODE STATUS with patient       Family Communication:   Family not at  Bedside    Disposition Plan:     To home once workup is complete and patient is stable   Following barriers for discharge:                                                        Pain controlled with PO medications                              able to transition to PO antibiotics                             Will need to be able to tolerate PO                                                     Will need consultants to evaluate patient prior to discharge   Consults called: Podiatry Dr. Lilian Kapur Sent email to Dr. Truett Perna  Admission status:  ED Disposition     ED Disposition  Admit   Condition  --   Comment  Hospital [facility]: [facility] [[id number]]  Level of Care: Med-Surg [16]  May admit patient to [facility] or [facility] if equivalent level of care is available:: No  Covid Evaluation: Asymptomatic Screening Protocol (No Symptoms)  Diagnosis: Osteomyelitis [facility]) [[id number]]  Admitting Physician: Therisa Doyne [[id number]]  Attending Physician: Therisa Doyne [[id number]]  Estimated length of stay: past midnight tomorrow  Certification:: I certify this patient will need inpatient services for at least 2 midnights           inpatient     I Expect 2 midnight stay secondary to severity of patient's current illness need for inpatient interventions justified by the following:    Severe lab/radiological/exam abnormalities including:    osteomyelitis and extensive comorbidities including:  substance abuse     That are currently affecting medical management.   I expect  patient to be hospitalized for 2 midnights requiring inpatient medical care.  Patient is at high risk for adverse outcome (such as loss of life or disability) if not treated.  Indication for inpatient stay as follows:    severe pain requiring acute inpatient management,    Need for operative/procedural  intervention    Need for IV antibiotics, IV fluids,  IV pain medications,      Level of care   medical floor      No results found for: SARSCOV2NAA   Precautions: admitted as    asymptomatic screening protocol   N95   eye Goggles,  Gloves       Jamiaya Bina  04/23/2021, 11:14 PM    [facility]      after 2 AM please page floor coverage PA If 7AM-7PM, please contact the day team taking care of the patient using [email or web address]   Patient was evaluated in the context of the global COVID-19 pandemic, which necessitated consideration that the patient might be at risk for infection with the SARS-CoV-2 virus that causes COVID-19. Institutional protocols and algorithms that pertain to the evaluation of patients at risk for COVID-19 are in a state of rapid change based on information released by regulatory bodies including the CDC and federal and state organizations. These policies and algorithms were followed during the patient's care.

## 2021-04-24 ENCOUNTER — Inpatient Hospital Stay (HOSPITAL_COMMUNITY): Payer: Medicare (Managed Care) | Admitting: Certified Registered Nurse Anesthetist

## 2021-04-24 ENCOUNTER — Encounter (HOSPITAL_COMMUNITY)
Admission: EM | Disposition: A | Discharge status: Home / Self Care | Payer: Self-pay | Source: Home / Self Care | Attending: Family Medicine

## 2021-04-24 ENCOUNTER — Inpatient Hospital Stay (HOSPITAL_COMMUNITY): Payer: Medicare (Managed Care)

## 2021-04-24 DIAGNOSIS — M869 Osteomyelitis, unspecified: Secondary | ICD-10-CM

## 2021-04-24 HISTORY — PX: AMPUTATION TOE: SHX6595

## 2021-04-24 LAB — CBC WITH DIFFERENTIAL/PLATELET
Abs Immature Granulocytes: 0.01 10*3/uL (ref 0.00–0.07)
Basophils Absolute: 0 10*3/uL (ref 0.0–0.1)
Basophils Relative: 0 %
Eosinophils Absolute: 0 10*3/uL (ref 0.0–0.5)
Eosinophils Relative: 0 %
HCT: 39.3 % (ref 36.0–46.0)
Hemoglobin: 13.6 g/dL (ref 12.0–15.0)
Immature Granulocytes: 0 %
Lymphocytes Relative: 24 %
Lymphs Abs: 0.6 10*3/uL — ABNORMAL LOW (ref 0.7–4.0)
MCH: 33.2 pg (ref 26.0–34.0)
MCHC: 34.6 g/dL (ref 30.0–36.0)
MCV: 95.9 fL (ref 80.0–100.0)
Monocytes Absolute: 0.3 10*3/uL (ref 0.1–1.0)
Monocytes Relative: 12 %
Neutro Abs: 1.7 10*3/uL (ref 1.7–7.7)
Neutrophils Relative %: 64 %
Platelets: 69 10*3/uL — ABNORMAL LOW (ref 150–400)
RBC: 4.1 MIL/uL (ref 3.87–5.11)
RDW: 13.8 % (ref 11.5–15.5)
WBC: 2.6 10*3/uL — ABNORMAL LOW (ref 4.0–10.5)
nRBC: 0 % (ref 0.0–0.2)

## 2021-04-24 LAB — HEMOGLOBIN A1C
Hgb A1c MFr Bld: 5.6 % (ref 4.8–5.6)
Mean Plasma Glucose: 114.02 mg/dL

## 2021-04-24 LAB — GLUCOSE, CAPILLARY
Glucose-Capillary: 107 mg/dL — ABNORMAL HIGH (ref 70–99)
Glucose-Capillary: 109 mg/dL — ABNORMAL HIGH (ref 70–99)
Glucose-Capillary: 132 mg/dL — ABNORMAL HIGH (ref 70–99)
Glucose-Capillary: 116 mg/dL — ABNORMAL HIGH (ref 70–99)
Glucose-Capillary: 120 mg/dL — ABNORMAL HIGH (ref 70–99)
Glucose-Capillary: 109 mg/dL — ABNORMAL HIGH (ref 70–99)
Glucose-Capillary: 96 mg/dL (ref 70–99)

## 2021-04-24 LAB — CBC
HCT: 38.4 % (ref 36.0–46.0)
Hemoglobin: 13.4 g/dL (ref 12.0–15.0)
MCH: 32.8 pg (ref 26.0–34.0)
MCHC: 34.9 g/dL (ref 30.0–36.0)
MCV: 94.1 fL (ref 80.0–100.0)
Platelets: 66 10*3/uL — ABNORMAL LOW (ref 150–400)
RBC: 4.08 MIL/uL (ref 3.87–5.11)
RDW: 13.7 % (ref 11.5–15.5)
WBC: 2.7 10*3/uL — ABNORMAL LOW (ref 4.0–10.5)
nRBC: 0 % (ref 0.0–0.2)

## 2021-04-24 LAB — PROTIME-INR
INR: 1.1 (ref 0.8–1.2)
Prothrombin Time: 14.1 seconds (ref 11.4–15.2)

## 2021-04-24 LAB — HEPATITIS PANEL, ACUTE
HCV Ab: NONREACTIVE
Hep A IgM: NONREACTIVE
Hep B C IgM: NONREACTIVE
Hepatitis B Surface Ag: NONREACTIVE

## 2021-04-24 LAB — ABO/RH: ABO/RH(D): O NEG

## 2021-04-24 LAB — RESP PANEL BY RT-PCR (FLU A&B, COVID) ARPGX2
Influenza A by PCR: NEGATIVE
Influenza B by PCR: NEGATIVE
SARS Coronavirus 2 by RT PCR: NEGATIVE

## 2021-04-24 LAB — SURGICAL PCR SCREEN
MRSA, PCR: NEGATIVE
Staphylococcus aureus: NEGATIVE

## 2021-04-24 LAB — C-REACTIVE PROTEIN: CRP: 7.6 mg/dL — ABNORMAL HIGH (ref <1.0)

## 2021-04-24 LAB — TYPE AND SCREEN
ABO/RH(D): O NEG
Antibody Screen: NEGATIVE

## 2021-04-24 LAB — HIV ANTIBODY (ROUTINE TESTING W REFLEX): HIV Screen 4th Generation wRfx: NONREACTIVE

## 2021-04-24 LAB — AMMONIA: Ammonia: 54 umol/L — ABNORMAL HIGH (ref 9–35)

## 2021-04-24 LAB — TSH: TSH: 3.589 u[IU]/mL (ref 0.350–4.500)

## 2021-04-24 LAB — MAGNESIUM: Magnesium: 2 mg/dL (ref 1.7–2.4)

## 2021-04-24 LAB — LACTATE DEHYDROGENASE: LDH: 146 U/L (ref 98–192)

## 2021-04-24 LAB — ETHANOL: Alcohol, Ethyl (B): <10 mg/dL (ref <10)

## 2021-04-24 LAB — FERRITIN: Ferritin: 150 ng/mL (ref 11–307)

## 2021-04-24 LAB — PHOSPHORUS: Phosphorus: 3 mg/dL (ref 2.5–4.6)

## 2021-04-24 LAB — SEDIMENTATION RATE: Sed Rate: 41 mm/hr — ABNORMAL HIGH (ref 0–22)

## 2021-04-24 SURGERY — AMPUTATION, TOE
Anesthesia: Monitor Anesthesia Care | Site: Toe | Laterality: Right

## 2021-04-24 MED ORDER — BUPIVACAINE HCL (PF) 0.25 % IJ SOLN
INTRAMUSCULAR | Status: DC | PRN
  Administered 2021-04-24: 15 mL

## 2021-04-24 MED ORDER — FENTANYL CITRATE (PF) 100 MCG/2ML IJ SOLN
INTRAMUSCULAR | Status: AC
  Administered 2021-04-24: 50 ug via INTRAVENOUS
  Filled 2021-04-24: qty 2

## 2021-04-24 MED ORDER — CHLORHEXIDINE GLUCONATE 0.12 % MT SOLN
OROMUCOSAL | Status: AC
  Administered 2021-04-24: 15 mL via OROMUCOSAL
  Filled 2021-04-24: qty 15

## 2021-04-24 MED ORDER — 0.9 % SODIUM CHLORIDE (POUR BTL) OPTIME
TOPICAL | Status: DC | PRN
  Administered 2021-04-24: 1000 mL

## 2021-04-24 MED ORDER — FENTANYL CITRATE (PF) 100 MCG/2ML IJ SOLN: 25.0000 ug | INTRAMUSCULAR | Status: DC | PRN

## 2021-04-24 MED ORDER — LACTATED RINGERS IV SOLN: INTRAVENOUS | Status: DC

## 2021-04-24 MED ORDER — JUVEN PO PACK
1.0000 | PACK | Freq: Two times a day (BID) | ORAL | Status: DC
  Administered 2021-04-25 – 2021-04-26 (×3): 1 via ORAL
  Filled 2021-04-24 (×3): qty 1

## 2021-04-24 MED ORDER — FUROSEMIDE 10 MG/ML IJ SOLN
20.0000 mg | Freq: Once | INTRAMUSCULAR | Status: AC
  Administered 2021-04-24: 20 mg via INTRAVENOUS
  Filled 2021-04-24: qty 2

## 2021-04-24 MED ORDER — FENTANYL CITRATE (PF) 100 MCG/2ML IJ SOLN: 50.0000 ug | Freq: Once | INTRAMUSCULAR | Status: AC

## 2021-04-24 MED ORDER — OXYCODONE-ACETAMINOPHEN 5-325 MG PO TABS
1.0000 | ORAL_TABLET | ORAL | Status: DC | PRN
  Administered 2021-04-24 – 2021-04-25 (×3): 1 via ORAL
  Filled 2021-04-24 (×3): qty 1

## 2021-04-24 MED ORDER — VENLAFAXINE HCL ER 150 MG PO CP24
150.0000 mg | ORAL_CAPSULE | Freq: Every day | ORAL | Status: DC
  Administered 2021-04-25 – 2021-04-26 (×2): 150 mg via ORAL
  Filled 2021-04-24 (×3): qty 1

## 2021-04-24 MED ORDER — PROPOFOL 500 MG/50ML IV EMUL
INTRAVENOUS | Status: DC | PRN
  Administered 2021-04-24: 75 ug/kg/min via INTRAVENOUS

## 2021-04-24 MED ORDER — SODIUM CHLORIDE 0.9% IV SOLUTION: Freq: Once | INTRAVENOUS | Status: AC

## 2021-04-24 MED ORDER — CHLORHEXIDINE GLUCONATE 0.12 % MT SOLN: 15.0000 mL | Freq: Once | OROMUCOSAL | Status: AC

## 2021-04-24 MED ORDER — LAMOTRIGINE 100 MG PO TABS
50.0000 mg | ORAL_TABLET | Freq: Two times a day (BID) | ORAL | Status: DC
  Administered 2021-04-24 – 2021-04-26 (×5): 50 mg via ORAL
  Filled 2021-04-24 (×6): qty 1

## 2021-04-24 MED ORDER — ORAL CARE MOUTH RINSE: 15.0000 mL | Freq: Once | OROMUCOSAL | Status: AC

## 2021-04-24 MED ORDER — ACETAMINOPHEN 325 MG PO TABS
650.0000 mg | ORAL_TABLET | Freq: Once | ORAL | Status: AC
  Administered 2021-04-24: 650 mg via ORAL
  Filled 2021-04-24: qty 2

## 2021-04-24 MED ORDER — ENSURE ENLIVE PO LIQD: 237.0000 mL | Freq: Every day | ORAL | Status: DC

## 2021-04-24 MED ORDER — SODIUM CHLORIDE 0.9 % IV SOLN
75.0000 mL/h | INTRAVENOUS | Status: DC
  Administered 2021-04-24: 75 mL/h via INTRAVENOUS

## 2021-04-24 MED ORDER — GABAPENTIN 300 MG PO CAPS: 300.0000 mg | ORAL_CAPSULE | Freq: Two times a day (BID) | ORAL | Status: DC

## 2021-04-24 MED ORDER — MIDAZOLAM HCL 2 MG/2ML IJ SOLN
INTRAMUSCULAR | Status: AC
  Filled 2021-04-24: qty 2

## 2021-04-24 MED ORDER — SODIUM CHLORIDE 0.9 % IV SOLN: 2.0000 g | Freq: Once | INTRAVENOUS | Status: DC

## 2021-04-24 MED ORDER — TRAZODONE HCL 50 MG PO TABS
100.0000 mg | ORAL_TABLET | Freq: Every day | ORAL | Status: DC
  Administered 2021-04-24 – 2021-04-25 (×3): 100 mg via ORAL
  Filled 2021-04-24 (×3): qty 2

## 2021-04-24 MED ORDER — PROPOFOL 10 MG/ML IV BOLUS
INTRAVENOUS | Status: AC
  Filled 2021-04-24: qty 20

## 2021-04-24 MED ORDER — FENTANYL CITRATE (PF) 250 MCG/5ML IJ SOLN
INTRAMUSCULAR | Status: AC
  Filled 2021-04-24: qty 5

## 2021-04-24 MED ORDER — DIPHENHYDRAMINE HCL 25 MG PO CAPS
25.0000 mg | ORAL_CAPSULE | Freq: Once | ORAL | Status: AC
  Administered 2021-04-24: 25 mg via ORAL
  Filled 2021-04-24: qty 1

## 2021-04-24 MED ORDER — BUPIVACAINE HCL (PF) 0.25 % IJ SOLN
INTRAMUSCULAR | Status: AC
  Filled 2021-04-24: qty 30

## 2021-04-24 SURGICAL SUPPLY — 35 items
BLADE LONG MED 31MMX9MM (MISCELLANEOUS)
BLADE LONG MED 31X9 (MISCELLANEOUS) IMPLANT
BNDG CMPR 75X41 PLY ABS (GAUZE/BANDAGES/DRESSINGS) ×1
BNDG ELASTIC 4X5.8 VLCR STR LF (GAUZE/BANDAGES/DRESSINGS) ×2 IMPLANT
BNDG STRETCH 4X75 NS LF (GAUZE/BANDAGES/DRESSINGS) ×2 IMPLANT
COVER SURGICAL LIGHT HANDLE (MISCELLANEOUS) ×3 IMPLANT
CUFF TOURN SGL QUICK 24 (TOURNIQUET CUFF)
CUFF TRNQT CYL 24X4X16.5-23 (TOURNIQUET CUFF) IMPLANT
DRAPE SURG 17X23 STRL (DRAPES) ×3 IMPLANT
GAUZE SPONGE 2X2 8PLY STRL LF (GAUZE/BANDAGES/DRESSINGS) IMPLANT
GAUZE SPONGE 4X4 12PLY STRL (GAUZE/BANDAGES/DRESSINGS) IMPLANT
GAUZE SPONGE 4X4 12PLY STRL LF (GAUZE/BANDAGES/DRESSINGS) ×2 IMPLANT
GAUZE XEROFORM 1X8 LF (GAUZE/BANDAGES/DRESSINGS) ×2 IMPLANT
GLOVE SURG ENC TEXT LTX SZ7 (GLOVE) ×3 IMPLANT
GLOVE SURG POLY ORTHO LF SZ7.5 (GLOVE) ×3 IMPLANT
GOWN STRL REUS W/ TWL LRG LVL3 (GOWN DISPOSABLE) ×2 IMPLANT
GOWN STRL REUS W/TWL LRG LVL3 (GOWN DISPOSABLE) ×6
KIT BASIN OR (CUSTOM PROCEDURE TRAY) ×3 IMPLANT
KIT TURNOVER KIT B (KITS) ×3 IMPLANT
NDL HYPO 25GX1X1/2 BEV (NEEDLE) IMPLANT
NEEDLE HYPO 25GX1X1/2 BEV (NEEDLE) IMPLANT
NS IRRIG 1000ML POUR BTL (IV SOLUTION) ×3 IMPLANT
PACK ORTHO EXTREMITY (CUSTOM PROCEDURE TRAY) ×3 IMPLANT
PAD ARMBOARD 7.5X6 YLW CONV (MISCELLANEOUS) ×6 IMPLANT
SOL PREP POV-IOD 4OZ 10% (MISCELLANEOUS) ×9 IMPLANT
SPECIMEN JAR SMALL (MISCELLANEOUS) ×3 IMPLANT
SPONGE GAUZE 2X2 STER 10/PKG (GAUZE/BANDAGES/DRESSINGS)
SUT ETHILON 3 0 PS 1 (SUTURE) ×3 IMPLANT
SYR CONTROL 10ML LL (SYRINGE) IMPLANT
TOWEL GREEN STERILE (TOWEL DISPOSABLE) ×3 IMPLANT
TOWEL GREEN STERILE FF (TOWEL DISPOSABLE) ×3 IMPLANT
TUBE CONNECTING 12'X1/4 (SUCTIONS)
TUBE CONNECTING 12X1/4 (SUCTIONS) IMPLANT
UNDERPAD 30X36 HEAVY ABSORB (UNDERPADS AND DIAPERS) ×3 IMPLANT
WATER STERILE IRR 1000ML POUR (IV SOLUTION) ×3 IMPLANT

## 2021-04-24 NOTE — Evaluation (Signed)
Occupational Therapy Evaluation Patient Details Name: Erika [name] MRN: [id number] DOB: Nov 10, 1955 Today's Date: 04/24/2021    History of Present Illness 65 yo female with onset of R foot blister had popped it 2 weeks ago and now has red, edematous and gangrenous wound on R little toe.  Dx of osteomyelitis, has plan to go to surgery 04/24/21.  PMHx:  hepatic steatosis, EtOH abuse, smoker, HTN, RA, shingles,   Clinical Impression   Pt admitted for concerns listed above. PTA pt reported that she was independent in all ADL's and IADL's except driving, which her family is able to assist her with. At the time of the evaluation, pt demonstrated continued mod I level for all ADL's. Acute OT will follow up after surgery and reassess all of pt's needs, mobility, and ADL performance.     Follow Up Recommendations  Home health OT    Equipment Recommendations  Tub/shower seat;Other (comment) (RW)    Recommendations for Other Services       Precautions / Restrictions Precautions Precautions: Fall Precaution Comments: monitor pain R foot Restrictions Weight Bearing Restrictions: No      Mobility Bed Mobility Overal bed mobility: Modified Independent Bed Mobility: Supine to Sit;Sit to Supine     Supine to sit: Min assist Sit to supine: Min assist   General bed mobility comments: Pt recieved EOB on entry, completed sit<>sup with no assist.    Transfers Overall transfer level: Modified independent Equipment used: None             General transfer comment: Mod I at this time for transfers    Balance Overall balance assessment: History of Falls Sitting-balance support: Feet supported Sitting balance-Leahy Scale: Good     Standing balance support: No upper extremity supported Standing balance-Leahy Scale: Fair                             ADL either performed or assessed with clinical judgement   ADL Overall ADL's : At baseline;Modified independent                                        General ADL Comments: At this time, pt ADL's and functional mobility are at a mod I level, Acute OT will reassess after surgery     Vision Baseline Vision/History: Cataracts Patient Visual Report: No change from baseline Vision Assessment?: No apparent visual deficits     Perception Perception Perception Tested?: No   Praxis Praxis Praxis tested?: Not tested    Pertinent Vitals/Pain Pain Assessment: 0-10 Pain Score: 5  Pain Location: R foot Pain Descriptors / Indicators: Tender;Burning;Grimacing Pain Intervention(s): Repositioned;Monitored during session;Limited activity within patient's tolerance     Hand Dominance Right   Extremity/Trunk Assessment Upper Extremity Assessment Upper Extremity Assessment: Overall WFL for tasks assessed   Lower Extremity Assessment Lower Extremity Assessment: Defer to PT evaluation   Cervical / Trunk Assessment Cervical / Trunk Assessment: Kyphotic   Communication Communication Communication: No difficulties   Cognition Arousal/Alertness: Awake/alert Behavior During Therapy: Flat affect Overall Cognitive Status: Within Functional Limits for tasks assessed                                 General Comments: pt lived alone and had many falls, unsafe and had help only to  be driven to appts   General Comments  VSS on RA    Exercises Exercises: Other exercises (LE strength is 4- to 4+)   Shoulder Instructions      Home Living Family/patient expects to be discharged to:: Private residence Living Arrangements: Alone Available Help at Discharge: Family;Available PRN/intermittently Type of Home: House Home Access: Stairs to enter [employer] of Steps: 3 steps   Home Layout: One level     Bathroom Shower/Tub: Producer, television/film/video: Standard Bathroom Accessibility: Yes How Accessible: Accessible via walker Home Equipment: Cane - single point    Additional Comments: pt was walking with no AD but falling often      Prior Functioning/Environment Level of Independence: Independent (Falling often)        Comments: states she did housework and cooking alone        OT Problem List: Decreased strength;Decreased activity tolerance;Impaired balance (sitting and/or standing);Decreased safety awareness;Decreased knowledge of use of DME or AE;Decreased knowledge of precautions;Pain;Impaired sensation      OT Treatment/Interventions: Self-care/ADL training;Therapeutic exercise;Energy conservation;DME and/or AE instruction;Therapeutic activities;Patient/family education;Balance training    OT Goals(Current goals can be found in the care plan section) Acute Rehab OT Goals Patient Stated Goal: to hurt less OT Goal Formulation: With patient Time For Goal Achievement: 05/08/21 Potential to Achieve Goals: Good ADL Goals Pt Will Perform Grooming: with modified independence;standing Pt Will Perform Lower Body Bathing: with modified independence;sitting/lateral leans;sit to/from stand Pt Will Perform Lower Body Dressing: with modified independence;sitting/lateral leans;sit to/from stand Pt Will Transfer to Toilet: with modified independence;ambulating Pt Will Perform Toileting - Clothing Manipulation and hygiene: with modified independence;sitting/lateral leans;sit to/from stand  OT Frequency: Min 2X/week   Barriers to D/C:            Co-evaluation              AM-PAC OT "6 Clicks" Daily Activity     Outcome Measure Help from another person eating meals?: Total (NPO) Help from another person taking care of personal grooming?: None Help from another person toileting, which includes using toliet, bedpan, or urinal?: None Help from another person bathing (including washing, rinsing, drying)?: None Help from another person to put on and taking off regular upper body clothing?: None Help from another person to put on and taking off  regular lower body clothing?: None 6 Click Score: 21   End of Session Nurse Communication: Mobility status  Activity Tolerance: Patient tolerated treatment well Patient left: in bed;with call bell/phone within reach  OT Visit Diagnosis: Unsteadiness on feet (R26.81);Other abnormalities of gait and mobility (R26.89);Muscle weakness (generalized) (M62.81)                Time: 7494-4967 OT Time Calculation (min): 13 min Charges:  OT General Charges $OT Visit: 1 Visit OT Evaluation $OT Eval Moderate Complexity: 1 Mod  Timmy Cleverly H., OTR/L Acute Rehabilitation  Haelee Bolen Elane Bing Plume 04/24/2021, 3:55 PM

## 2021-04-24 NOTE — Transfer of Care (Signed)
Immediate Anesthesia Transfer of Care Note  Patient: Erika [name]  Procedure(s) Performed: AMPUTATION TOE (Right: Toe)  Patient Location: PACU  Anesthesia Type:MAC combined with regional for post-op pain  Level of Consciousness: awake and alert   Airway & Oxygen Therapy: Patient Spontanous Breathing  Post-op Assessment: Report given to RN and Post -op Vital signs reviewed and stable  Post vital signs: Reviewed and stable  Last Vitals:  Vitals Value Taken Time  BP 168/83 04/24/21 1918  Temp    Pulse 63 04/24/21 1921  Resp 19 04/24/21 1921  SpO2 100 % 04/24/21 1921  Vitals shown include unvalidated device data.  Last Pain:  Vitals:   04/24/21 1637  TempSrc: Oral  PainSc: 5       Patients Stated Pain Goal: 3 (58/30/94 0768)  Complications: No notable events documented.

## 2021-04-24 NOTE — Progress Notes (Signed)
History and Physical Interval Note:  04/24/2021 6:20 PM  Erika [name]  has presented today for surgery, with the diagnosis of right 5th toe osteomyelitis.  The various methods of treatment have been discussed with the patient and family. After consideration of risks, benefits and other options for treatment, the patient has consented to   Procedure(s): AMPUTATION TOE (Right) as a surgical intervention.  The patient's history has been reviewed, patient examined, no change in status, stable for surgery.  I have reviewed the patient's chart and labs.  Questions were answered to the patient's satisfaction.     Edwin Cap

## 2021-04-24 NOTE — Evaluation (Signed)
Physical Therapy Evaluation Patient Details Name: Erika [name] MRN: [id number] DOB: December 07, 1955 Today's Date: 04/24/2021   History of Present Illness  65 yo female with onset of R foot blister had popped it 2 weeks ago and now has red, edematous and gangrenous wound on R little toe.  Dx of osteomyelitis, has plan to go to surgery 04/24/21.  PMHx:  hepatic steatosis, EtOH abuse, smoker, HTN, RA, shingles,  Clinical Impression  Pt has been home alone with help for transport, but has been falling due to insufficient support and R foot pain.  Her plan is to go to surgery this afternoon, and will reassess her tomorrow to see how things have changed for her.  Pain and the resulting ankle ROM limits of being in PF posture are making pt unsafe, laterally unstable to R and unable to use R leg as secure landing in walking, even on walker.  Follow up with her to see how things have evolved, and will anticipate therapy in CIR to be needed given her history of being unsafe.  Has family and friends who are available to help, but may default to SNF if CIR is not accepted.    Follow Up Recommendations CIR    Equipment Recommendations  Rolling walker with 5" wheels    Recommendations for Other Services Rehab consult     Precautions / Restrictions Precautions Precautions: Fall Precaution Comments: monitor pain R foot Restrictions Weight Bearing Restrictions: No  Likely to change after surgery    Mobility  Bed Mobility Overal bed mobility: Needs Assistance Bed Mobility: Supine to Sit;Sit to Supine     Supine to sit: Min assist Sit to supine: Min assist   General bed mobility comments: min assist only from pain    Transfers Overall transfer level: Modified independent Equipment used: Rolling walker (2 wheeled);1 person hand held assist             General transfer comment: used min guard for safety but can stand  Ambulation/Gait Ambulation/Gait assistance: Min assist Gait Distance  (Feet): 16 Feet Assistive device: Rolling walker (2 wheeled);1 person hand held assist Gait Pattern/deviations: Step-to pattern;Step-through pattern;Decreased stride length;Decreased dorsiflexion - right;Decreased weight shift to right;Decreased stance time - right;Decreased step length - left;Staggering right Gait velocity: reduced   General Gait Details: pt cannot step onto R foot well.  Loses balance with poor standing control  Stairs            Wheelchair Mobility    Modified Rankin (Stroke Patients Only)       Balance Overall balance assessment: History of Falls;Needs assistance Sitting-balance support: Feet supported;Bilateral upper extremity supported Sitting balance-Leahy Scale: Fair     Standing balance support: Bilateral upper extremity supported;During functional activity Standing balance-Leahy Scale: Poor                               Pertinent Vitals/Pain Pain Assessment: 0-10 Pain Score: 10-Worst pain ever Pain Location: R foot Pain Descriptors / Indicators: Tender;Burning;Grimacing Pain Intervention(s): Limited activity within patient's tolerance;Monitored during session;Premedicated before session;Repositioned    Home Living Family/patient expects to be discharged to:: Unsure                 Additional Comments: pt was walking with no AD but falling often    Prior Function Level of Independence: Independent (but falling, did not drive, had help to get groceries and go to [place])  Comments: states she did housework and cooking alone     Hand Dominance   Dominant Hand: Right    Extremity/Trunk Assessment   Upper Extremity Assessment Upper Extremity Assessment: Generalized weakness    Lower Extremity Assessment Lower Extremity Assessment: Generalized weakness    Cervical / Trunk Assessment Cervical / Trunk Assessment: Kyphotic (mild)  Communication   Communication: No difficulties  Cognition Arousal/Alertness:  Awake/alert Behavior During Therapy: Anxious Overall Cognitive Status: No family/caregiver present to determine baseline cognitive functioning                                 General Comments: pt lived alone and had many falls, unsafe and had help only to be driven to appts      General Comments General comments (skin integrity, edema, etc.): used walker and still had significant issue of her balance control being insufficient, pain on R foot is making pt unsafe    Exercises     Assessment/Plan    PT Assessment Patient needs continued PT services  PT Problem List Decreased strength;Decreased range of motion;Decreased activity tolerance;Decreased balance;Decreased mobility;Decreased coordination;Decreased knowledge of use of DME;Decreased knowledge of precautions;Decreased skin integrity;Pain       PT Treatment Interventions DME instruction;Gait training;Stair training;Functional mobility training;Therapeutic activities;Therapeutic exercise;Balance training;Neuromuscular re-education;Patient/family education    PT Goals (Current goals can be found in the Care Plan section)  Acute Rehab PT Goals Patient Stated Goal: to hurt less PT Goal Formulation: With patient Time For Goal Achievement: 05/08/21 Potential to Achieve Goals: Fair    Frequency Min 4X/week   Barriers to discharge Inaccessible home environment;Decreased caregiver support home alone with two stairs to enter    Co-evaluation               AM-PAC PT "6 Clicks" Mobility  Outcome Measure Help needed turning from your back to your side while in a flat bed without using bedrails?: A Little Help needed moving from lying on your back to sitting on the side of a flat bed without using bedrails?: A Little Help needed moving to and from a bed to a chair (including a wheelchair)?: A Little Help needed standing up from a chair using your arms (e.g., wheelchair or bedside chair)?: A Little Help needed to  walk in hospital room?: A Little Help needed climbing 3-5 steps with a railing? : A Lot 6 Click Score: 17    End of Session Equipment Utilized During Treatment: Gait belt Activity Tolerance: Patient limited by fatigue;Patient limited by pain Patient left: in bed;with call bell/phone within reach;with nursing/sitter in room Nurse Communication: Mobility status PT Visit Diagnosis: Unsteadiness on feet (R26.81);Muscle weakness (generalized) (M62.81);Pain Pain - Right/Left: Right Pain - part of body: Ankle and joints of foot    Time: 1120-1136 PT Time Calculation (min) (ACUTE ONLY): 16 min   Charges:   PT Evaluation $PT Eval Moderate Complexity: 1 Mod         Ivar Drape 04/24/2021, 3:22 PM Samul Dada, PT MS Acute Rehab Dept. Number: [facility] [id number] and [facility] [phone]

## 2021-04-24 NOTE — Progress Notes (Signed)
PROGRESS NOTE   EMPRISS VASUDEVAN  MLY:[id number] DOB: October 03, 1956 DOA: 04/23/2021 PCP: Roselind Messier, PA-C  Brief Narrative:  50 white female community dwelling Known history of seropositive CCP positive erosive rheumatoid arthritis followed last 2014 by rheumatology Dr. Silvio Pate (previously prednisone monotherapy-subsequent methotrexate induction)-off prednisone and methotrexate because of insurance issues ED visit in the past?  Shingles-followed by ID Depression Continued smoker 210 ounce EtOH current Prediabetic? Came to emergency room 04/23/2021 right foot pain X 2 weeks + swelling-blister that popped Work-up revealed osteomyelitis on x-ray of foot Podiatry consulted-n.p.o. for possible procedure-cefepime/vancomycin initiated  Hospital-Problem based course  Osteomyelitis right little toe with abscess Defer to podiatry-planning for surgery later 6/24-continue cefepime vancomycin A.m. CBC--saline 75 cc/H Pain control as per podiatrist-currently on oxycodone 1 tab every 4 as needed Can continue home meds of gabapentin 100 3 times daily Probable cirrhosis, mild pancytopenia worsened likely by infection--hepatitis panel negative this admission Obtain platelet smear LFTs are relatively normal--INR is 1 1 unit platelets transfused 6/24 Monitor trends Prediabetic not on meds CBGs 1 16-1 30 continue sliding scale given infection Will need outpatient follow-up and discussion regarding the settings Chronic ethanolism Keep on alcohol detox protocol Monitor mentation carefully-so far no concerns for withdrawal Rheumatoid arthritis lost to follow-up last seen 2014 [facility] Not currently on DMA RD so her prednisone-May need to reestablish care  DVT prophylaxis: SCDs Code Status: Full Family Communication: None present at the bedside Disposition:  Status is: Inpatient  Not inpatient appropriate, will call UM team and downgrade to OBS.   Dispo: The patient is from:  Home              Anticipated d/c is to:  Unclear at this time              Patient currently is not medically stable to d/c.   Difficult to place patient No       Consultants:  Podiatrist Dr. Abbott Pao  Procedures:   Antimicrobials:  Cefepime and vancomycin since 6/23   Subjective: Awake alert awaiting surgery No chest pain no fever Some pain in right foot?  6-7/10  Objective: Vitals:   04/24/21 0741 04/24/21 1202 04/24/21 1223 04/24/21 1330  BP: (!) 119/58 119/78 106/64 (!) 108/58  Pulse: 61 60 61 (!) 58  Resp: 17 17 16 17   Temp: 97.9 F (36.6 C) 98.2 F (36.8 C) 97.7 F (36.5 C) 98.1 F (36.7 C)  TempSrc: Oral Oral Oral Oral  SpO2: 98% 100% 97% 97%  Weight:      Height:        Intake/Output Summary (Last 24 hours) at 04/24/2021 1544 Last data filed at 04/24/2021 1516 Gross per 24 hour  Intake 1016.46 ml  Output --  Net 1016.46 ml   Filed Weights   04/23/21 2240  Weight: 70.3 kg    Examination:  Thick neck looks older than stated age no icterus No lymphadenopathy in the submandibular region S1-S2 sinus Abdomen obese nontender cannot appreciate hepatosplenomegaly No tremor moves 4 limbs equally Foot exam-right little toe diffusely swollen and erythematous some mild erythema to forefoot on right side sensory intact although diminished on that side   Data Reviewed: personally reviewed   WBC 3.2-->2.6--CRP 7.6 lactic acid 1.4 Platelet 360 at baseline 2011-->69--AST/ALT 28/19, ammonia 54, INR 1.1 BUNs/creatinine 7/0.8 Blood culture X2--6/23 pending MRI foot = soft tissue wound fifth digit severe bone marrow edema fifth proximal middle distal phalanx = osteomyelitis, 3X3X 22 mm fluid collection dorsal aspect  fifth digit-?  Abscess  Radiology Studies: MRI Right foot without contrast  Result Date: 04/24/2021 CLINICAL DATA:  Right fifth toe wound over the first toe. Evaluate for osteomyelitis. EXAM: MRI OF THE RIGHT FOREFOOT WITHOUT CONTRAST TECHNIQUE:  Multiplanar, multisequence MR imaging of the right forefoot was performed. No intravenous contrast was administered. COMPARISON:  None. FINDINGS: Bones/Joint/Cartilage Soft tissue wound of the fifth digit. Severe bone marrow edema in the fifth proximal, middle and distal phalanx most consistent with osteomyelitis. Normal alignment. No joint effusion. No other marrow signal abnormality. Moderate osteoarthritis of the first MTP joint. Mild osteoarthritis of the first IP joint. Ligaments Collateral ligaments are intact. Lisfranc ligament is intact. Muscles and Tendons Flexor, peroneal and extensor compartment tendons are intact. Muscles are normal. Soft tissue Severe soft tissue edema along the dorsal aspect of the foot which may be reactive versus secondary to cellulitis. 3 x 3 x 22 mm fluid collection along the dorsal aspect of the fifth digit concerning for a small abscess. No soft tissue mass. IMPRESSION: 1. Soft tissue wound of the fifth digit with severe bone marrow edema in the fifth proximal, middle and distal phalanx most consistent with osteomyelitis. 2. Severe soft tissue edema along the dorsal aspect of the foot which may be reactive versus secondary to cellulitis. 3 x 3 x 22 mm fluid collection along the dorsal aspect of the fifth digit concerning for a small abscess. 3. Moderate osteoarthritis of the first MTP joint. Electronically Signed   By: Elige Ko   On: 04/24/2021 07:11   DG CHEST PORT 1 VIEW  Result Date: 04/23/2021 CLINICAL DATA:  Foot pain and swelling EXAM: PORTABLE CHEST 1 VIEW COMPARISON:  05/18/2011 FINDINGS: Low lung volumes. Borderline to mild cardiomegaly. Mild elevation right diaphragm. No consolidation or effusion. No pneumothorax. IMPRESSION: No active disease. Electronically Signed   By: Jasmine Pang M.D.   On: 04/23/2021 23:28   DG Foot Complete Right  Result Date: 04/23/2021 CLINICAL DATA:  Foot wound. Right foot pain and swelling. Fifth toe is oozing and black. EXAM:  RIGHT FOOT COMPLETE - 3+ VIEW COMPARISON:  None. FINDINGS: Destruction in decreased density of the fifth toe distal phalanx as well as distal aspect of the middle phalanx consistent with osteomyelitis. There is slight decreased density of the lateral aspect of the fifth metatarsal head that is nonspecific. No fracture. 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 Soft tissue edema about the dorsum of the foot and fifth toe. No definite soft tissue air. No radiopaque foreign body. IMPRESSION: 1. Findings consistent with osteomyelitis of the fifth toe distal and middle phalanges. 2. Slight decreased density of the lateral aspect of the fifth metatarsal head is nonspecific, possible additional site of osteomyelitis. 3. Soft tissue edema without soft tissue air or radiopaque foreign body. Electronically Signed   By: Narda Rutherford M.D.   On: 04/23/2021 16:40   US Abdomen Limited RUQ (LIVER/GB)  Result Date: 04/23/2021 CLINICAL DATA:  Hepatomegaly EXAM: ULTRASOUND ABDOMEN LIMITED RIGHT UPPER QUADRANT COMPARISON:  None. FINDINGS: Gallbladder: No gallstones or wall thickening visualized. No sonographic Murphy sign noted by sonographer. Common bile duct: Diameter: 4.7 mm Liver: Right lobe length measurement 16.9 cm, within normal limits. Liver appears slightly echogenic. No focal hepatic abnormality portal vein is patent on color Doppler imaging with normal direction of blood flow towards the liver. Other: None. IMPRESSION: Slightly echogenic appearing liver as may be seen with steatosis Electronically Signed   By: Jasmine Pang  M.D.   On: 04/23/2021 23:58     Scheduled Meds:  Melene Muller ON 04/25/2021] feeding supplement  237 mL Oral QHS   folic acid  1 mg Oral Daily   insulin aspart  0-9 Units Subcutaneous Q4H   lamoTRIgine  50 mg Oral BID   multivitamin with minerals  1 tablet Oral Daily   nicotine  21 mg Transdermal Daily   [START ON 04/25/2021] nutrition supplement  (JUVEN)  1 packet Oral BID BM   thiamine  100 mg Oral Daily   Or   thiamine  100 mg Intravenous Daily   traZODone  100 mg Oral QHS   venlafaxine XR  150 mg Oral Q breakfast   Continuous Infusions:  sodium chloride 75 mL/hr (04/24/21 1516)   ceFEPime (MAXIPIME) IV Stopped (04/24/21 0905)   vancomycin       LOS: 1 day   Time spent: 73  Rhetta Mura, MD [facility] To contact the attending provider between 7A-7P or the covering provider during after hours 7P-7A, please log into the web site [email or web address] and access using universal [place] password for that web site. If you do not have the password, please call the hospital operator.  04/24/2021, 3:44 PM

## 2021-04-24 NOTE — Progress Notes (Signed)
Initial Nutrition Assessment  DOCUMENTATION CODES:   Not applicable  INTERVENTION:   -Once diet is advanced, add:   -Ensure Enlive po daily, each supplement provides 350 kcal and 20 grams of protein  -1 packet Juven BID, each packet provides 95 calories, 2.5 grams of protein (collagen), and 9.8 grams of carbohydrate (3 grams sugar); also contains 7 grams of L-arginine and L-glutamine, 300 mg vitamin C, 15 mg vitamin E, 1.2 mcg vitamin B-12, 9.5 mg zinc, 200 mg calcium, and 1.5 g  Calcium Beta-hydroxy-Beta-methylbutyrate to support wound healing  -MVI with minerals daily  NUTRITION DIAGNOSIS:   Increased nutrient needs related to wound healing as evidenced by estimated needs.  GOAL:   Patient will meet greater than or equal to 90% of their needs  MONITOR:   PO intake, Supplement acceptance, Diet advancement, Labs, Weight trends, Skin, I & O's  REASON FOR ASSESSMENT:   Consult Assessment of nutrition requirement/status  ASSESSMENT:   Erika [name] is a 65 y.o. female with medical history significant of HTN, Rheumatoid arthritis  Pt admitted with osteomyelitis on fifth toe of rt foot.   Reviewed I/O's: +240 ml x 24 hours  Per MD notes, pt currently NPO for possible procedure.   Spoke with pt at bedside, who reports that her appetite is fair at baseline. She typically consumes 2 meals per day (Lunch: BLT; Dinner: meat, starch, and vegetable).   Pt denies any weight loss. She reports UBW is around 150#.   Discussed importance of good meal and supplement intake to promote healing. Pt amenable to supplements.    Medications reviewed and include lasix and thiamine.   Lab Results  Component Value Date   HGBA1C 5.6 04/24/2021   PTA DM medications are none.   Labs reviewed: CBGS: 109-132 (inpatient orders for glycemic control are 0-9 units inuslin aspart every 4 hours).    NUTRITION - FOCUSED PHYSICAL EXAM:  Flowsheet Row Most Recent Value  Orbital Region No  depletion  Upper Arm Region No depletion  Thoracic and Lumbar Region No depletion  Buccal Region No depletion  Temple Region No depletion  Clavicle Bone Region No depletion  Clavicle and Acromion Bone Region No depletion  Scapular Bone Region No depletion  Dorsal Hand No depletion  Patellar Region No depletion  Anterior Thigh Region No depletion  Posterior Calf Region No depletion  Edema (RD Assessment) None  Hair Reviewed  Eyes Reviewed  Mouth Reviewed  Skin Reviewed  Nails Reviewed       Diet Order:   Diet Order             Diet NPO time specified  Diet effective now                   EDUCATION NEEDS:   Education needs have been addressed  Skin:  Skin Assessment: Reviewed RN Assessment  Last BM:  04/22/21  Height:   Ht Readings from Last 1 Encounters:  04/23/21 5\' 1"  (1.549 m)    Weight:   Wt Readings from Last 1 Encounters:  04/23/21 70.3 kg    Ideal Body Weight:  47.7 kg  BMI:  Body mass index is 29.29 kg/m.  Estimated Nutritional Needs:   Kcal:  1900-2100  Protein:  105-120 grams  Fluid:  > 1.9 L    04/25/21, RD, LDN, CDCES Registered Dietitian II Certified Diabetes Care and Education Specialist Please refer to [facility] for RD and/or RD on-call/weekend/after hours pager

## 2021-04-24 NOTE — Anesthesia Preprocedure Evaluation (Addendum)
Anesthesia Evaluation  Patient identified by MRN, date of birth, ID band Patient awake    Reviewed: NPO status , Patient's Chart, lab work & pertinent test results  Airway Mallampati: II  TM Distance: >3 FB     Dental   Pulmonary Current Smoker and Patient abstained from smoking.,    breath sounds clear to auscultation       Cardiovascular hypertension,  Rhythm:Regular Rate:Normal     Neuro/Psych PSYCHIATRIC DISORDERS Depression    GI/Hepatic Neg liver ROS, GERD  ,  Endo/Other    Renal/GU negative Renal ROS     Musculoskeletal  (+) Arthritis ,   Abdominal   Peds  Hematology   Anesthesia Other Findings   Reproductive/Obstetrics                             Anesthesia Physical Anesthesia Plan  ASA: 3  Anesthesia Plan: MAC   Post-op Pain Management:    Induction: Intravenous  PONV Risk Score and Plan: 2 and Ondansetron, Dexamethasone and Midazolam  Airway Management Planned: Nasal Cannula and Simple Face Mask  Additional Equipment:   Intra-op Plan:   Post-operative Plan:   Informed Consent: I have reviewed the patients History and Physical, chart, labs and discussed the procedure including the risks, benefits and alternatives for the proposed anesthesia with the patient or authorized representative who has indicated his/her understanding and acceptance.     Dental advisory given  Plan Discussed with: CRNA and Anesthesiologist  Anesthesia Plan Comments:        Anesthesia Quick Evaluation

## 2021-04-24 NOTE — Plan of Care (Signed)
  Problem: Clinical Measurements: Goal: Ability to maintain clinical measurements within normal limits will improve Outcome: Progressing   Problem: Health Behavior/Discharge Planning: Goal: Ability to manage health-related needs will improve Outcome: Progressing   Problem: Clinical Measurements: Goal: Ability to maintain clinical measurements within normal limits will improve Outcome: Progressing   Problem: Activity: Goal: Risk for activity intolerance will decrease Outcome: Progressing   Problem: Nutrition: Goal: Adequate nutrition will be maintained Outcome: Progressing   Problem: Coping: Goal: Level of anxiety will decrease Outcome: Progressing   Problem: Elimination: Goal: Will not experience complications related to bowel motility Outcome: Progressing   Problem: Pain Managment: Goal: General experience of comfort will improve Outcome: Progressing   Problem: Safety: Goal: Ability to remain free from injury will improve Outcome: Progressing   Problem: Skin Integrity: Goal: Risk for impaired skin integrity will decrease Outcome: Progressing

## 2021-04-24 NOTE — Brief Op Note (Signed)
04/24/2021  7:14 PM  PATIENT:  Erika [name]  65 y.o. female  PRE-OPERATIVE DIAGNOSIS:  abscess osteomyelitis  POST-OPERATIVE DIAGNOSIS:  abscess osteomyelitis  PROCEDURE: Fifth toe amputation with fillet of toe flap  SURGEON:  Surgeon(s) and Role:    * Braidon Chermak, Stephan Minister, DPM - Primary   ASSISTANTS: none   ANESTHESIA:   MAC  EBL: Minimal  BLOOD ADMINISTERED:none  DRAINS: none   LOCAL MEDICATIONS USED:  BUPIVICAINE 0.25% 15 cc  SPECIMEN: Right fifth toe bone and abscess  DISPOSITION OF SPECIMEN:  PATHOLOGY and micro  COUNTS:  YES  TOURNIQUET:   Total Tourniquet Time Documented: area (laterality) -17 minutes   DICTATION: .Note written in EPIC  PLAN OF CARE: Admit to inpatient   PATIENT DISPOSITION:  PACU - hemodynamically stable.   Delay start of Pharmacological VTE agent (>24hrs) due to surgical blood loss or risk of bleeding: yes

## 2021-04-24 NOTE — Hospital Course (Addendum)
65 white female community dwelling Known history of seropositive CCP positive erosive rheumatoid arthritis followed last 2014 by rheumatology Dr. Silvio Pate (previously prednisone monotherapy-subsequent methotrexate induction)-off prednisone and methotrexate because of insurance issues ED visit in the past?  Shingles-followed by ID Depression Continued smoker 210 ounce EtOH current EtOH Prediabetic? Came to emergency room 04/23/2021 right foot pain X 2 weeks + swelling-blister that popped Work-up revealed osteomyelitis on x-ray of foot Podiatry consulted-n.p.o. for possible procedure-cefepime/vancomycin initiated

## 2021-04-25 LAB — PREPARE PLATELET PHERESIS: Unit division: 0

## 2021-04-25 LAB — CBC WITH DIFFERENTIAL/PLATELET
Abs Immature Granulocytes: 0.01 10*3/uL (ref 0.00–0.07)
Basophils Absolute: 0 10*3/uL (ref 0.0–0.1)
Basophils Relative: 1 %
Eosinophils Absolute: 0 10*3/uL (ref 0.0–0.5)
Eosinophils Relative: 0 %
HCT: 35.4 % — ABNORMAL LOW (ref 36.0–46.0)
Hemoglobin: 12.4 g/dL (ref 12.0–15.0)
Immature Granulocytes: 1 %
Lymphocytes Relative: 20 %
Lymphs Abs: 0.4 10*3/uL — ABNORMAL LOW (ref 0.7–4.0)
MCH: 33.4 pg (ref 26.0–34.0)
MCHC: 35 g/dL (ref 30.0–36.0)
MCV: 95.4 fL (ref 80.0–100.0)
Monocytes Absolute: 0.3 10*3/uL (ref 0.1–1.0)
Monocytes Relative: 12 %
Neutro Abs: 1.4 10*3/uL — ABNORMAL LOW (ref 1.7–7.7)
Neutrophils Relative %: 66 %
Platelets: 63 10*3/uL — ABNORMAL LOW (ref 150–400)
RBC: 3.71 MIL/uL — ABNORMAL LOW (ref 3.87–5.11)
RDW: 13.7 % (ref 11.5–15.5)
WBC: 2.2 10*3/uL — ABNORMAL LOW (ref 4.0–10.5)
nRBC: 0 % (ref 0.0–0.2)

## 2021-04-25 LAB — COMPREHENSIVE METABOLIC PANEL
ALT: 20 U/L (ref 0–44)
AST: 33 U/L (ref 15–41)
Albumin: 2.7 g/dL — ABNORMAL LOW (ref 3.5–5.0)
Alkaline Phosphatase: 84 U/L (ref 38–126)
Anion gap: 6 (ref 5–15)
BUN: 8 mg/dL (ref 8–23)
CO2: 26 mmol/L (ref 22–32)
Calcium: 8.1 mg/dL — ABNORMAL LOW (ref 8.9–10.3)
Chloride: 104 mmol/L (ref 98–111)
Creatinine, Ser: 0.85 mg/dL (ref 0.44–1.00)
GFR, Estimated: >60 mL/min (ref >60)
Glucose, Bld: 195 mg/dL — ABNORMAL HIGH (ref 70–99)
Potassium: 4 mmol/L (ref 3.5–5.1)
Sodium: 136 mmol/L (ref 135–145)
Total Bilirubin: 1.6 mg/dL — ABNORMAL HIGH (ref 0.3–1.2)
Total Protein: 6.1 g/dL — ABNORMAL LOW (ref 6.5–8.1)

## 2021-04-25 LAB — GLUCOSE, CAPILLARY
Glucose-Capillary: 107 mg/dL — ABNORMAL HIGH (ref 70–99)
Glucose-Capillary: 108 mg/dL — ABNORMAL HIGH (ref 70–99)
Glucose-Capillary: 115 mg/dL — ABNORMAL HIGH (ref 70–99)
Glucose-Capillary: 119 mg/dL — ABNORMAL HIGH (ref 70–99)
Glucose-Capillary: 156 mg/dL — ABNORMAL HIGH (ref 70–99)
Glucose-Capillary: 203 mg/dL — ABNORMAL HIGH (ref 70–99)
Glucose-Capillary: 90 mg/dL (ref 70–99)

## 2021-04-25 LAB — BPAM PLATELET PHERESIS
Blood Product Expiration Date: 202206262359
ISSUE DATE / TIME: 202206241152
Unit Type and Rh: 9500

## 2021-04-25 LAB — HAPTOGLOBIN: Haptoglobin: 76 mg/dL (ref 37–355)

## 2021-04-25 MED ORDER — OXYCODONE-ACETAMINOPHEN 5-325 MG PO TABS
1.0000 | ORAL_TABLET | ORAL | Status: DC | PRN
Start: 2021-04-25 — End: 2021-04-26
  Administered 2021-04-25: 1 via ORAL
  Filled 2021-04-25: qty 1

## 2021-04-25 NOTE — Progress Notes (Signed)
Inpatient Rehab Admissions Coordinator:   CIR consult received. Pt. Does not demonstrate medical necessity of CIR admission. I will not pursue an admission. CIR to sign off.   Megan Salon, MS, CCC-SLP Rehab Admissions Coordinator  [phone] (celll) [phone] (office)

## 2021-04-25 NOTE — Anesthesia Postprocedure Evaluation (Signed)
Anesthesia Post Note  Patient: Erika [name]  Procedure(s) Performed: AMPUTATION TOE (Right: Toe)     Patient location during evaluation: PACU Anesthesia Type: MAC Level of consciousness: awake and alert Pain management: pain level controlled Vital Signs Assessment: post-procedure vital signs reviewed and stable Respiratory status: spontaneous breathing, nonlabored ventilation and respiratory function stable Cardiovascular status: stable and blood pressure returned to baseline Postop Assessment: no apparent nausea or vomiting Anesthetic complications: no   No notable events documented.  Last Vitals:  Vitals:   04/25/21 0020 04/25/21 0414  BP: (!) 123/51 (!) 143/61  Pulse: 60 66  Resp: 15 16  Temp: 36.8 C 36.6 C  SpO2: 99% 98%    Last Pain:  Vitals:   04/25/21 0414  TempSrc: Oral  PainSc:                  Catalina Gravel

## 2021-04-25 NOTE — Progress Notes (Signed)
PROGRESS NOTE   Erika [name]  PYK:[id number] DOB: 1956-06-05 DOA: 04/23/2021 PCP: Roselind Messier, PA-C  Brief Narrative:  14 white female community dwelling Known history of seropositive CCP positive erosive rheumatoid arthritis followed last 2014 by rheumatology Dr. Silvio Pate (previously prednisone monotherapy-subsequent methotrexate induction)-off prednisone and methotrexate because of insurance issues ED visit in the past?  Shingles-followed by ID Depression Continued smoker 210 ounce EtOH current Prediabetic? Came to emergency room 04/23/2021 right foot pain X 2 weeks + swelling-blister that popped Work-up revealed osteomyelitis on x-ray of foot Podiatry consulted-n.p.o. 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 Defer to podiatry-continue cefepime vancomycin Pain control as per podiatrist-currently on oxycodone 1 tab every 4 as needed Can continue home meds of gabapentin 100 3 times daily Probable cirrhosis, mild pancytopenia worsened likely by infection--hepatitis panel negative this admission Await platelet smear 1 unit platelets transfused 6/24 OP work-up Prediabetic not on meds-A1c 5.6 this admission CBGs 115-203 Will need outpatient follow-up and discussion regarding the settings Chronic ethanolism Keep on alcohol detox protocol Monitor mentation carefully-so far no concerns for withdrawal Rheumatoid arthritis lost to follow-up last seen 2014 [facility] Not currently on DMA RD so her prednisone-May need to reestablish care  DVT prophylaxis: SCDs Code Status: Full Family Communication: None present at the bedside Disposition:  Status is: Inpatient  Remains inpatient appropriate because:Unsafe d/c plan and IV treatments appropriate due to intensity of illness or inability to take PO  Dispo: The patient is  from: Home              Anticipated d/c is to:  Unclear at this time              Patient currently is not medically stable to d/c.   Difficult to place patient No       Consultants:  Podiatrist Dr. Abbott Pao  Procedures:    on admit   MRI foot = soft tissue wound fifth digit severe bone marrow edema fifth proximal middle distal phalanx = osteomyelitis, 3X3X 22 mm fluid collection dorsal aspect fifth digit-?  Abscess  PROCEDURE 6/24: Fifth toe amputation with fillet of toe flap  Antimicrobials:  Cefepime and vancomycin since 6/23   Subjective:  Awake alert in nad no focal deficit Asking about going home Podiatry will be by later--I explained that we will determine best antibiotics and pain control prior to this She will need home health  Objective: Vitals:   04/24/21 2019 04/25/21 0020 04/25/21 0414 04/25/21 0700  BP: (!) 163/73 (!) 123/51 (!) 143/61   Pulse: 62 60 66   Resp: 19 15 16    Temp: 97.8 F (36.6 C) 98.3 F (36.8 C) 97.9 F (36.6 C)   TempSrc:  Oral Oral   SpO2: 98% 99% 98%   Weight:    73.1 kg  Height:        Intake/Output Summary (Last 24 hours) at 04/25/2021 04/27/2021 Last data filed at 04/24/2021 2300 Gross per 24 hour  Intake 1209.8 ml  Output --  Net 1209.8 ml    Filed Weights   04/23/21 2240 04/25/21 0700  Weight: 70.3 kg 73.1 kg    Examination:  Eomi ncat thick neck Mallampati 4 looks older than stated age Cta b no added sound Abd soft nt nd no rebound R foot in darco boot with dressings   Data Reviewed: personally reviewed   WBC  3.2-->2.2 Platelet 360 at baseline-> stable in the 60 range Blood culture X2--6/23 still pending Deep wound cult 6/24 Pending   Radiology Studies: MRI Right foot without contrast  Result Date: 04/24/2021 CLINICAL DATA:  Right fifth toe wound over the first toe. Evaluate for osteomyelitis. EXAM: MRI OF THE RIGHT FOREFOOT WITHOUT CONTRAST TECHNIQUE: Multiplanar, multisequence MR imaging of the right  forefoot was performed. No intravenous contrast was administered. COMPARISON:  None. FINDINGS: Bones/Joint/Cartilage Soft tissue wound of the fifth digit. Severe bone marrow edema in the fifth proximal, middle and distal phalanx most consistent with osteomyelitis. Normal alignment. No joint effusion. No other marrow signal abnormality. Moderate osteoarthritis of the first MTP joint. Mild osteoarthritis of the first IP joint. Ligaments Collateral ligaments are intact. Lisfranc ligament is intact. Muscles and Tendons Flexor, peroneal and extensor compartment tendons are intact. Muscles are normal. Soft tissue Severe soft tissue edema along the dorsal aspect of the foot which may be reactive versus secondary to cellulitis. 3 x 3 x 22 mm fluid collection along the dorsal aspect of the fifth digit concerning for a small abscess. No soft tissue mass. IMPRESSION: 1. Soft tissue wound of the fifth digit with severe bone marrow edema in the fifth proximal, middle and distal phalanx most consistent with osteomyelitis. 2. Severe soft tissue edema along the dorsal aspect of the foot which may be reactive versus secondary to cellulitis. 3 x 3 x 22 mm fluid collection along the dorsal aspect of the fifth digit concerning for a small abscess. 3. Moderate osteoarthritis of the first MTP joint. Electronically Signed   By: Elige Ko   On: 04/24/2021 07:11   DG CHEST PORT 1 VIEW  Result Date: 04/23/2021 CLINICAL DATA:  Foot pain and swelling EXAM: PORTABLE CHEST 1 VIEW COMPARISON:  05/18/2011 FINDINGS: Low lung volumes. Borderline to mild cardiomegaly. Mild elevation right diaphragm. No consolidation or effusion. No pneumothorax. IMPRESSION: No active disease. Electronically Signed   By: Jasmine Pang M.D.   On: 04/23/2021 23:28   DG Foot 2 Views Right  Result Date: 04/24/2021 CLINICAL DATA:  Postoperative films EXAM: RIGHT FOOT - 2 VIEW COMPARISON:  MRI 04/23/2021 radiograph 04/23/2021 FINDINGS: Patient is post amputation  of the fifth ray at the level of the metatarsophalangeal joint. The decreased density along the distal metatarsal head remain similar to preoperative radiographs. Overlying postsurgical soft tissue changes are noted with bandaging material. No other acute or conspicuous osseous abnormalities are seen. Degenerative changes noted elsewhere throughout the foot. Tiny ossifications proximal to the navicular, could reflect accessory ossicles. IMPRESSION: Postoperative changes from amputation of the fifth ray at the metatarsophalangeal joint. Some mild subcortical lucency along the distal fifth metatarsal head is similar to prior and remains somewhat conspicuous though could reflect reactive marrow changes. Continued attention on follow-up imaging is recommended. Electronically Signed   By: Kreg Shropshire M.D.   On: 04/24/2021 21:54   DG Foot Complete Right  Result Date: 04/23/2021 CLINICAL DATA:  Foot wound. Right foot pain and swelling. Fifth toe is oozing and black. EXAM: RIGHT FOOT COMPLETE - 3+ VIEW COMPARISON:  None. FINDINGS: Destruction in decreased density of the fifth toe distal phalanx as well as distal aspect of the middle phalanx consistent with osteomyelitis. There is slight decreased density of the lateral aspect of the fifth metatarsal head that is nonspecific. No fracture. 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 Soft tissue edema about the dorsum of the foot and fifth toe. No definite soft  tissue air. No radiopaque foreign body. IMPRESSION: 1. Findings consistent with osteomyelitis of the fifth toe distal and middle phalanges. 2. Slight decreased density of the lateral aspect of the fifth metatarsal head is nonspecific, possible additional site of osteomyelitis. 3. Soft tissue edema without soft tissue air or radiopaque foreign body. Electronically Signed   By: Narda Rutherford M.D.   On: 04/23/2021 16:40   US Abdomen Limited RUQ (LIVER/GB)  Result  Date: 04/23/2021 CLINICAL DATA:  Hepatomegaly EXAM: ULTRASOUND ABDOMEN LIMITED RIGHT UPPER QUADRANT COMPARISON:  None. FINDINGS: Gallbladder: No gallstones or wall thickening visualized. No sonographic Murphy sign noted by sonographer. Common bile duct: Diameter: 4.7 mm Liver: Right lobe length measurement 16.9 cm, within normal limits. Liver appears slightly echogenic. No focal hepatic abnormality portal vein is patent on color Doppler imaging with normal direction of blood flow towards the liver. Other: None. IMPRESSION: Slightly echogenic appearing liver as may be seen with steatosis Electronically Signed   By: Jasmine Pang M.D.   On: 04/23/2021 23:58     Scheduled Meds:  feeding supplement  237 mL Oral QHS   folic acid  1 mg Oral Daily   insulin aspart  0-9 Units Subcutaneous Q4H   lamoTRIgine  50 mg Oral BID   multivitamin with minerals  1 tablet Oral Daily   nicotine  21 mg Transdermal Daily   nutrition supplement (JUVEN)  1 packet Oral BID BM   thiamine  100 mg Oral Daily   Or   thiamine  100 mg Intravenous Daily   traZODone  100 mg Oral QHS   venlafaxine XR  150 mg Oral Q breakfast   Continuous Infusions:  sodium chloride 75 mL/hr (04/24/21 2243)   ceFEPime (MAXIPIME) IV 2 g (04/25/21 0053)   vancomycin 750 mg (04/24/21 2247)     LOS: 2 days   Time spent: 45  Rhetta Mura, MD [facility] To contact the attending provider between 7A-7P or the covering provider during after hours 7P-7A, please log into the web site [email or web address] and access using universal [place] password for that web site. If you do not have the password, please call the hospital operator.  04/25/2021, 7:17 AM

## 2021-04-25 NOTE — Op Note (Signed)
Patient Name: Erika [name] DOB: October 05, 1956  MRN: [id number]   Date of Service: 04/23/2021 - 04/24/2021  Surgeon: Dr. Sharl Ma, DPM Assistants: None Pre-operative Diagnosis:  Osteomyelitis right 5th toe Post-operative Diagnosis:  Osteomyelitis right 5th toe Procedures:  1) amputation right  5th toe MTPJ with  fillet of toe  flap Pathology/Specimens: ID Type Source Tests Collected by Time Destination  A : Abscess right 5th toe Abscess Wound AEROBIC/ANAEROBIC CULTURE W GRAM STAIN (SURGICAL/DEEP WOUND) Edwin Cap, DPM 04/24/2021 1850   B : Right 5th toe  Amputation Toe, Right ANAEROBIC CULTURE W Romie Minus STAIN (Canceled) Edwin Cap, [facility] 04/24/2021 1853    Anesthesia: none Hemostasis:  Total Tourniquet Time Documented: area (laterality) - 17  Estimated Blood Loss: none Materials: * No implants in log * Medications: 15cc 0.25% bupivacaine plain Complications: none  Indications for Procedure:  This is a 65 y.o. female with a history of rheumatoid arthritis admitted [facility] for osteomyelitis and cellulitis. Amputation was recommended to control the infection. All questions addressed prior to surgery   Procedure in Detail: Patient was identified in pre-operative holding area. Formal consent was signed and the righ lower extremity was marked. Patient was brought back to the operating room. Anesthesia was induced. The extremity was prepped and draped in the usual sterile fashion. Timeout was taken to confirm patient name, laterality, and procedure prior to incision.   Attention was then directed to the right  5th toe where an incision was made preserving the  plantar soft  tissues which appeared healthy and non-necrotic. Dissection was carried down to level of bone.  Dissection was continued to the MTP joint and all collateral ligaments were freed at the joint.  The bone and soft tissue attachments of the proximal phalanx were removed and the phalanges were excised  and passed for pathology.  The necrotic and non viable dorsal tissues were excised. The remaining metatarsal head appeared healthy and viable.  The area was copiously irrigated.  The plantar soft  tissues of the toe were  shaped and fashioned to create a circular skin flap. This was  inset into the defect over the metatarsal head. It was closed with 3-0 nylon and 3-0 monocryl. Good  perfusion was noted. The tourniquet was deflated after 17 minutes with good  return  of peripheral  pulses and capillary fill time.  The foot was then dressed with xeroform,  DSD, and an ACE wrap under gentle compression. Patient tolerated the procedure well.   Disposition: Following a period of post-operative monitoring, patient will be transferred to the floor.

## 2021-04-25 NOTE — Progress Notes (Signed)
Culture data pending, hopeful prelim results tomorrow and can discharge on 1-2 weeks PO abx. Will see tomorrow and change dressing for flap evaluation.  Sharl Ma, DPM 04/25/2021

## 2021-04-25 NOTE — Progress Notes (Signed)
Orthopedic Tech Progress Note Patient Details:  Erika [name] 1955-12-19 [id number]  Ortho Devices Type of Ortho Device: Postop shoe/boot Ortho Device/Splint Interventions: Ordered      [name] 04/25/2021, 8:38 AM Left shoe at bedside

## 2021-04-25 NOTE — Progress Notes (Signed)
Physical Therapy Treatment Patient Details Name: Erika [name] MRN: [id number] DOB: Mar 14, 1956 Today's Date: 04/25/2021    History of Present Illness 65 yo female admitted 04/23/21  with onset of R foot blister had popped it 2 weeks ago and now has red, edematous and gangrenous wound on R little toe.  Work reveal osetomyelits of R 5th toe.  s/p R 5th toe amputation on 04/25/21 g Dx of osteomyelitis, has plan to go to surgery 04/24/21.  PMHx:  hepatic steatosis, EtOH abuse, smoker, HTN, RA, shingles,    PT Comments    Pt supine in bed on arrival this session.  Pt educated on use of post op shoe and new weight bearing.  Pt continues to have balance impairment present and presents with hard LOB to the L.  She will continue to benefit from aggressive rehab in a post acute setting to maximize functional gains before returning home.    Follow Up Recommendations  CIR     Equipment Recommendations  Rolling walker with 5" wheels;3in1 (PT)    Recommendations for Other Services Rehab consult     Precautions / Restrictions Precautions Precautions: Fall Precaution Comments: monitor pain R foot Required Braces or Orthoses: Other Brace (R post op shoe) Restrictions Weight Bearing Restrictions: Yes RLE Weight Bearing: Weight bearing as tolerated (with post op shoe donned)    Mobility  Bed Mobility Overal bed mobility: Modified Independent Bed Mobility: Supine to Sit;Sit to Supine     Supine to sit: Supervision     General bed mobility comments: Pt able to move to edge of bed with rocking momentum to elevate trunk into sitting from supine.    Transfers Overall transfer level: Needs assistance Equipment used: Rolling walker (2 wheeled) Transfers: Sit to/from Stand Sit to Stand: Min assist         General transfer comment: min assistance to rise from commode.  Supervision from edge of bed.  Ambulation/Gait Ambulation/Gait assistance: Min assist;Mod assist Gait Distance (Feet):  65 Feet (+ 10 ft) Assistive device: Rolling walker (2 wheeled) Gait Pattern/deviations: Step-to pattern;Trunk flexed;Decreased stride length Gait velocity: reduced   General Gait Details: Pt with poor RW safety and hard LOB to the L in the bathroom when approaching the toilet.  Cues to step closer to RW to improve safety.   Stairs             Wheelchair Mobility    Modified Rankin (Stroke Patients Only)       Balance Overall balance assessment: Needs assistance Sitting-balance support: Feet supported Sitting balance-Leahy Scale: Good       Standing balance-Leahy Scale: Fair                              Cognition Arousal/Alertness: Awake/alert Behavior During Therapy: Flat affect Overall Cognitive Status: Impaired/Different from baseline Area of Impairment: Following commands;Safety/judgement                       Following Commands: Follows one step commands inconsistently Safety/Judgement: Decreased awareness of safety;Decreased awareness of deficits     General Comments: LOB and poor use of RW despite cues.      Exercises      General Comments        Pertinent Vitals/Pain Pain Assessment: 0-10 Pain Score: 7  Pain Location: R foot<R knee Pain Descriptors / Indicators: Discomfort;Guarding Pain Intervention(s): Repositioned;Ice applied (ice applied to R knee)  Home Living                      Prior Function            PT Goals (current goals can now be found in the care plan section) Acute Rehab PT Goals Patient Stated Goal: to hurt less Potential to Achieve Goals: Fair Progress towards PT goals: Progressing toward goals    Frequency    Min 4X/week      PT Plan Current plan remains appropriate    Co-evaluation              AM-PAC PT "6 Clicks" Mobility   Outcome Measure  Help needed turning from your back to your side while in a flat bed without using bedrails?: A Little Help needed moving  from lying on your back to sitting on the side of a flat bed without using bedrails?: A Little Help needed moving to and from a bed to a chair (including a wheelchair)?: A Little Help needed standing up from a chair using your arms (e.g., wheelchair or bedside chair)?: A Little Help needed to walk in hospital room?: A Lot Help needed climbing 3-5 steps with a railing? : A Little 6 Click Score: 17    End of Session Equipment Utilized During Treatment: Gait belt Activity Tolerance: Patient limited by fatigue;Patient limited by pain Patient left: in bed;with call bell/phone within reach;with nursing/sitter in room Nurse Communication: Mobility status PT Visit Diagnosis: Unsteadiness on feet (R26.81);Muscle weakness (generalized) (M62.81);Pain Pain - Right/Left: Right Pain - part of body: Ankle and joints of foot     Time: 1041-1100 PT Time Calculation (min) (ACUTE ONLY): 19 min  Charges:  $Gait Training: 8-22 mins                     Bonney Leitz , PTA Acute Rehabilitation Services Pager [phone] Office [phone]    Kamren Heskett Artis Delay 04/25/2021, 11:13 AM

## 2021-04-26 ENCOUNTER — Encounter (HOSPITAL_COMMUNITY): Payer: Self-pay | Admitting: Podiatry

## 2021-04-26 DIAGNOSIS — F1010 Alcohol abuse, uncomplicated: Secondary | ICD-10-CM

## 2021-04-26 LAB — GLUCOSE, CAPILLARY
Glucose-Capillary: 108 mg/dL — ABNORMAL HIGH (ref 70–99)
Glucose-Capillary: 79 mg/dL (ref 70–99)
Glucose-Capillary: 84 mg/dL (ref 70–99)
Glucose-Capillary: 92 mg/dL (ref 70–99)

## 2021-04-26 LAB — CBC WITH DIFFERENTIAL/PLATELET
Abs Immature Granulocytes: 0 10*3/uL (ref 0.00–0.07)
Basophils Absolute: 0 10*3/uL (ref 0.0–0.1)
Basophils Relative: 0 %
Eosinophils Absolute: 0 10*3/uL (ref 0.0–0.5)
Eosinophils Relative: 0 %
HCT: 36.9 % (ref 36.0–46.0)
Hemoglobin: 13.1 g/dL (ref 12.0–15.0)
Immature Granulocytes: 0 %
Lymphocytes Relative: 29 %
Lymphs Abs: 0.7 10*3/uL (ref 0.7–4.0)
MCH: 33.6 pg (ref 26.0–34.0)
MCHC: 35.5 g/dL (ref 30.0–36.0)
MCV: 94.6 fL (ref 80.0–100.0)
Monocytes Absolute: 0.3 10*3/uL (ref 0.1–1.0)
Monocytes Relative: 12 %
Neutro Abs: 1.4 10*3/uL — ABNORMAL LOW (ref 1.7–7.7)
Neutrophils Relative %: 59 %
Platelets: 60 10*3/uL — ABNORMAL LOW (ref 150–400)
RBC: 3.9 MIL/uL (ref 3.87–5.11)
RDW: 13.3 % (ref 11.5–15.5)
WBC: 2.4 10*3/uL — ABNORMAL LOW (ref 4.0–10.5)
nRBC: 0 % (ref 0.0–0.2)

## 2021-04-26 LAB — COMPREHENSIVE METABOLIC PANEL
ALT: 22 U/L (ref 0–44)
AST: 38 U/L (ref 15–41)
Albumin: 2.8 g/dL — ABNORMAL LOW (ref 3.5–5.0)
Alkaline Phosphatase: 91 U/L (ref 38–126)
Anion gap: 8 (ref 5–15)
BUN: 13 mg/dL (ref 8–23)
CO2: 24 mmol/L (ref 22–32)
Calcium: 9 mg/dL (ref 8.9–10.3)
Chloride: 107 mmol/L (ref 98–111)
Creatinine, Ser: 0.74 mg/dL (ref 0.44–1.00)
GFR, Estimated: >60 mL/min (ref >60)
Glucose, Bld: 94 mg/dL (ref 70–99)
Potassium: 4.3 mmol/L (ref 3.5–5.1)
Sodium: 139 mmol/L (ref 135–145)
Total Bilirubin: 1.5 mg/dL — ABNORMAL HIGH (ref 0.3–1.2)
Total Protein: 6.7 g/dL (ref 6.5–8.1)

## 2021-04-26 MED ORDER — OXYCODONE-ACETAMINOPHEN 5-325 MG PO TABS: 1.0000 | ORAL_TABLET | ORAL | 0 refills | Status: DC | PRN

## 2021-04-26 MED ORDER — AMOXICILLIN-POT CLAVULANATE 875-125 MG PO TABS: 1.0000 | ORAL_TABLET | Freq: Two times a day (BID) | ORAL | 0 refills | Status: AC

## 2021-04-26 NOTE — Discharge Summary (Signed)
Physician Discharge Summary  Erika [name] LDJ:[id number] DOB: 1956-08-27 DOA: 04/23/2021  PCP: Roselind Messier, PA-C  Admit date: 04/23/2021 Discharge date: 04/26/2021  Time spent: 22 minutes  Recommendations for Outpatient Follow-up:  Comeplete augementin as directed Cbc/cmet 1 wk OP Podiatry f/u already arranged Pls counsel re: ETOH-follow blood smear--needs Discussion TCP work-up as OP Discuss Pre-DM with patient as OP per PCP  Discharge Diagnoses:  MAIN problem for hospitalization   Cellulitis abscess s/p toe amputation  Please see below for itemized issues addressed in HOpsital- refer to other progress notes for clarity if needed  Discharge Condition: fair  Diet recommendation: hh  Filed Weights   04/23/21 2240 04/25/21 0700  Weight: 70.3 kg 73.1 kg    History of present illness:  19 white female community dwelling Known history of seropositive CCP positive erosive rheumatoid arthritis followed last 2014 by rheumatology Dr. Silvio Pate (previously prednisone monotherapy-subsequent methotrexate induction)-off prednisone and methotrexate because of insurance issues ED visit in the past?  Shingles-followed by ID Depression Continued smoker 210 ounce EtOH current Prediabetic? Came to emergency room 04/23/2021 right foot pain X 2 weeks + swelling-blister that popped Work-up revealed osteomyelitis on x-ray of foot Podiatry consulted-n.p.o. 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 Lilian Kapur Podiatry cefepime vancomycin-->Augmentin on d/c Pain control as per podiatrist-currently on oxycodone 1 tab every 4 as needed--given rx on d/c Can continue home meds of gabapentin 100 3 times daily Probable cirrhosis, mild pancytopenia worsened likely by infection--hepatitis panel negative this admission Await platelet smear 1 unit  platelets transfused 6/24 Likely 2/2 cirrhosis and drinking Prediabetic not on meds-A1c 5.6 this admission CBGs 115-203 Will need outpatient follow-up and discussion regarding the settings Chronic ethanolism [place] neg Patient need OP ETOH counsel Rheumatoid arthritis lost to follow-up last seen 2014 [facility] Not currently on DMARD s/prednisone-May need to reestablish care   Discharge Exam: Vitals:   04/25/21 2002 04/26/21 0736  BP: 136/65 (!) 167/99  Pulse: 65 63  Resp:  16  Temp:  97.7 F (36.5 C)  SpO2:  96%    Subj on day of d/c   Interactive No pain  General Exam on discharge  Eomi thick neck, flat affect no distress Ctab no rales wound not examined  Discharge Instructions   Discharge Instructions     Diet - low sodium heart healthy   Complete by: As directed    Discharge instructions   Complete by: As directed    Complete antibiotics Tylenol 1st choice, Percocet 2nd choice pain Follow Dr. Lilian Kapur as per his instructions and weight bearing as per him   Increase activity slowly   Complete by: As directed    No wound care   Complete by: As directed       Allergies as of 04/26/2021   No Known Allergies      Medication List     STOP taking these medications    escitalopram 20 MG tablet Commonly known as: LEXAPRO   gabapentin 100 MG capsule Commonly known as: NEURONTIN   lisinopril 10 MG tablet Commonly known as: ZESTRIL   mirtazapine 15 MG tablet Commonly known as: REMERON   morphine 15 MG tablet Commonly known as: MSIR   pantoprazole 40 MG tablet Commonly known as: PROTONIX   pregabalin 300 MG capsule Commonly known as: LYRICA   triamcinolone lotion 0.1 % Commonly known as: KENALOG  TAKE these medications    acetaminophen 325 MG tablet Commonly known as: Tylenol Take 2 tablets (650 mg total) by mouth every 6 (six) hours as needed.   amoxicillin-clavulanate 875-125 MG tablet Commonly known as: Augmentin Take 1  tablet by mouth 2 (two) times daily for 10 days.   lamoTRIgine 100 MG tablet Commonly known as: LAMICTAL Take 50 mg by mouth 2 (two) times daily.   mupirocin cream 2 % Commonly known as: BACTROBAN Apply 1 application topically 2 (two) times daily.   oxyCODONE-acetaminophen 5-325 MG tablet Commonly known as: PERCOCET/ROXICET Take 1-2 tablets by mouth every 4 (four) hours as needed for moderate pain. What changed:  how much to take reasons to take this   traZODone 100 MG tablet Commonly known as: DESYREL Take 100 mg by mouth at bedtime.   venlafaxine XR 150 MG 24 hr capsule Commonly known as: EFFEXOR-XR Take 150 mg by mouth daily with breakfast.       No Known Allergies  Follow-up Information     Edwin Cap, DPM. Schedule an appointment as soon as possible for a visit on 05/05/2021.   Specialty: Podiatry Why: For wound re-check  My office will call you. If you have any issues call [phone] to get in touch with me Contact information: [address] [place] [address] [phone]                  The results of significant diagnostics from this hospitalization (including imaging, microbiology, ancillary and laboratory) are listed below for reference.    Significant Diagnostic Studies: MRI Right foot without contrast  Result Date: 04/24/2021 CLINICAL DATA:  Right fifth toe wound over the first toe. Evaluate for osteomyelitis. EXAM: MRI OF THE RIGHT FOREFOOT WITHOUT CONTRAST TECHNIQUE: Multiplanar, multisequence MR imaging of the right forefoot was performed. No intravenous contrast was administered. COMPARISON:  None. FINDINGS: Bones/Joint/Cartilage Soft tissue wound of the fifth digit. Severe bone marrow edema in the fifth proximal, middle and distal phalanx most consistent with osteomyelitis. Normal alignment. No joint effusion. No other marrow signal abnormality. Moderate osteoarthritis of the first MTP joint. Mild osteoarthritis of the first IP  joint. Ligaments Collateral ligaments are intact. Lisfranc ligament is intact. Muscles and Tendons Flexor, peroneal and extensor compartment tendons are intact. Muscles are normal. Soft tissue Severe soft tissue edema along the dorsal aspect of the foot which may be reactive versus secondary to cellulitis. 3 x 3 x 22 mm fluid collection along the dorsal aspect of the fifth digit concerning for a small abscess. No soft tissue mass. IMPRESSION: 1. Soft tissue wound of the fifth digit with severe bone marrow edema in the fifth proximal, middle and distal phalanx most consistent with osteomyelitis. 2. Severe soft tissue edema along the dorsal aspect of the foot which may be reactive versus secondary to cellulitis. 3 x 3 x 22 mm fluid collection along the dorsal aspect of the fifth digit concerning for a small abscess. 3. Moderate osteoarthritis of the first MTP joint. Electronically Signed   By: Elige Ko   On: 04/24/2021 07:11   DG CHEST PORT 1 VIEW  Result Date: 04/23/2021 CLINICAL DATA:  Foot pain and swelling EXAM: PORTABLE CHEST 1 VIEW COMPARISON:  05/18/2011 FINDINGS: Low lung volumes. Borderline to mild cardiomegaly. Mild elevation right diaphragm. No consolidation or effusion. No pneumothorax. IMPRESSION: No active disease. Electronically Signed   By: Jasmine Pang M.D.   On: 04/23/2021 23:28   DG Foot 2 Views Right  Result  Date: 04/24/2021 CLINICAL DATA:  Postoperative films EXAM: RIGHT FOOT - 2 VIEW COMPARISON:  MRI 04/23/2021 radiograph 04/23/2021 FINDINGS: Patient is post amputation of the fifth ray at the level of the metatarsophalangeal joint. The decreased density along the distal metatarsal head remain similar to preoperative radiographs. Overlying postsurgical soft tissue changes are noted with bandaging material. No other acute or conspicuous osseous abnormalities are seen. Degenerative changes noted elsewhere throughout the foot. Tiny ossifications proximal to the navicular, could reflect  accessory ossicles. IMPRESSION: Postoperative changes from amputation of the fifth ray at the metatarsophalangeal joint. Some mild subcortical lucency along the distal fifth metatarsal head is similar to prior and remains somewhat conspicuous though could reflect reactive marrow changes. Continued attention on follow-up imaging is recommended. Electronically Signed   By: Kreg Shropshire M.D.   On: 04/24/2021 21:54   DG Foot Complete Right  Result Date: 04/23/2021 CLINICAL DATA:  Foot wound. Right foot pain and swelling. Fifth toe is oozing and black. EXAM: RIGHT FOOT COMPLETE - 3+ VIEW COMPARISON:  None. FINDINGS: Destruction in decreased density of the fifth toe distal phalanx as well as distal aspect of the middle phalanx consistent with osteomyelitis. There is slight decreased density of the lateral aspect of the fifth metatarsal head that is nonspecific. No fracture. 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 Soft tissue edema about the dorsum of the foot and fifth toe. No definite soft tissue air. No radiopaque foreign body. IMPRESSION: 1. Findings consistent with osteomyelitis of the fifth toe distal and middle phalanges. 2. Slight decreased density of the lateral aspect of the fifth metatarsal head is nonspecific, possible additional site of osteomyelitis. 3. Soft tissue edema without soft tissue air or radiopaque foreign body. Electronically Signed   By: Narda Rutherford M.D.   On: 04/23/2021 16:40   US Abdomen Limited RUQ (LIVER/GB)  Result Date: 04/23/2021 CLINICAL DATA:  Hepatomegaly EXAM: ULTRASOUND ABDOMEN LIMITED RIGHT UPPER QUADRANT COMPARISON:  None. FINDINGS: Gallbladder: No gallstones or wall thickening visualized. No sonographic Murphy sign noted by sonographer. Common bile duct: Diameter: 4.7 mm Liver: Right lobe length measurement 16.9 cm, within normal limits. Liver appears slightly echogenic. No focal hepatic abnormality portal vein is patent  on color Doppler imaging with normal direction of blood flow towards the liver. Other: None. IMPRESSION: Slightly echogenic appearing liver as may be seen with steatosis Electronically Signed   By: Jasmine Pang M.D.   On: 04/23/2021 23:58    Microbiology: Recent Results (from the past 240 hour(s))  Blood culture (routine x 2)     Status: None (Preliminary result)   Collection Time: 04/23/21  9:35 PM   Specimen: BLOOD  Result Value Ref Range Status   Specimen Description BLOOD SITE NOT SPECIFIED  Final   Special Requests   Final    BOTTLES DRAWN AEROBIC AND ANAEROBIC Blood Culture adequate volume   Culture   Final    NO GROWTH 2 DAYS Performed at [facility], [address] [place], [place] [address]    Report Status PENDING  Incomplete  Blood culture (routine x 2)     Status: None (Preliminary result)   Collection Time: 04/23/21  9:47 PM   Specimen: BLOOD  Result Value Ref Range Status   Specimen Description BLOOD SITE NOT SPECIFIED  Final   Special Requests   Final    BOTTLES DRAWN AEROBIC AND ANAEROBIC Blood Culture adequate volume   Culture   Final    NO  GROWTH 2 DAYS Performed at [facility], [address] [place], [place] [address]    Report Status PENDING  Incomplete  Resp Panel by RT-PCR (Flu A&B, Covid) Nasopharyngeal Swab     Status: None   Collection Time: 04/23/21 10:39 PM   Specimen: Nasopharyngeal Swab; Nasopharyngeal(NP) swabs in vial transport medium  Result Value Ref Range Status   SARS Coronavirus 2 by RT PCR NEGATIVE NEGATIVE Final    Comment: (NOTE) SARS-CoV-2 target nucleic acids are NOT DETECTED.  The SARS-CoV-2 RNA is generally detectable in upper respiratory specimens during the acute phase of infection. The lowest concentration of SARS-CoV-2 viral copies this assay can detect is 138 copies/mL. A negative result does not preclude SARS-Cov-2 infection and should not be used as the sole basis for treatment or other patient management  decisions. A negative result may occur with  improper specimen collection/handling, submission of specimen other than nasopharyngeal swab, presence of viral mutation(s) within the areas targeted by this assay, and inadequate number of viral copies(<138 copies/mL). A negative result must be combined with clinical observations, patient history, and epidemiological information. The expected result is Negative.  Fact Sheet for Patients:  BloggerCourse.com  Fact Sheet for Healthcare Providers:  SeriousBroker.it  This test is no t yet approved or cleared by the [place] FDA and  has been authorized for detection and/or diagnosis of SARS-CoV-2 by FDA under an Emergency Use Authorization (EUA). This EUA will remain  in effect (meaning this test can be used) for the duration of the COVID-19 declaration under Section 564(b)(1) of the Act, 21 U.S.C.section 360bbb-3(b)(1), unless the authorization is terminated  or revoked sooner.       Influenza A by PCR NEGATIVE NEGATIVE Final   Influenza B by PCR NEGATIVE NEGATIVE Final    Comment: (NOTE) The Xpert Xpress SARS-CoV-2/FLU/RSV plus assay is intended as an aid in the diagnosis of influenza from Nasopharyngeal swab specimens and should not be used as a sole basis for treatment. Nasal washings and aspirates are unacceptable for Xpert Xpress SARS-CoV-2/FLU/RSV testing.  Fact Sheet for Patients: BloggerCourse.com  Fact Sheet for Healthcare Providers: SeriousBroker.it  This test is not yet approved or cleared by the [place] FDA and has been authorized for detection and/or diagnosis of SARS-CoV-2 by FDA under an Emergency Use Authorization (EUA). This EUA will remain in effect (meaning this test can be used) for the duration of the COVID-19 declaration under Section 564(b)(1) of the Act, 21 U.S.C. section 360bbb-3(b)(1), unless the  authorization is terminated or revoked.  Performed at [facility], [address], [place], [place] [address]   Surgical pcr screen     Status: None   Collection Time: 04/24/21  2:52 PM   Specimen: Nasal Mucosa; Nasal Swab  Result Value Ref Range Status   MRSA, PCR NEGATIVE NEGATIVE Final   Staphylococcus aureus NEGATIVE NEGATIVE Final    Comment: (NOTE) The Xpert SA Assay (FDA approved for NASAL specimens in patients 24 years of age and older), is one component of a comprehensive surveillance program. It is not intended to diagnose infection nor to guide or monitor treatment. Performed at [facility], [address] [place], [place] [address]   Aerobic/Anaerobic Culture w Gram Stain (surgical/deep wound)     Status: None (Preliminary result)   Collection Time: 04/24/21  6:50 PM   Specimen: Wound; Abscess  Result Value Ref Range Status   Specimen Description ABSCESS RIGHT TOE  Final   Special Requests  RT 5TH TOE  Final   Gram Stain   Final    FEW WBC PRESENT,BOTH PMN AND MONONUCLEAR RARE GRAM POSITIVE COCCI    Culture   Final    CULTURE REINCUBATED FOR BETTER GROWTH Performed at [facility], [address], [place], [place] [address]    Report Status PENDING  Incomplete  Aerobic/Anaerobic Culture w Gram Stain (surgical/deep wound)     Status: None (Preliminary result)   Collection Time: 04/24/21  6:53 PM   Specimen: Tissue  Result Value Ref Range Status   Specimen Description TISSUE  Final   Special Requests RT 5TH TOE  Final   Gram Stain   Final    MODERATE WBC PRESENT, PREDOMINANTLY PMN RARE GRAM POSITIVE COCCI Performed at [facility], [address], [place], [place] [address]    Culture PENDING  Incomplete   Report Status PENDING  Incomplete     Labs: Basic Metabolic Panel: Recent Labs  Lab 04/23/21 1600 04/24/21 0046 04/25/21 0127 04/26/21 0143  NA 136  --  136 139  K 3.7  --  4.0 4.3  CL 104  --  104 107  CO2 26  --  26  24  GLUCOSE 192*  --  195* 94  BUN 7*  --  8 13  CREATININE 0.83  --  0.85 0.74  CALCIUM 8.2*  --  8.1* 9.0  MG  --  2.0  --   --   PHOS  --  3.0  --   --    Liver Function Tests: Recent Labs  Lab 04/23/21 1600 04/25/21 0127 04/26/21 0143  AST 28 33 38  ALT ALKPHOS 101 84 91  BILITOT 1.0 1.6* 1.5*  PROT 6.5 6.1* 6.7  ALBUMIN 2.9* 2.7* 2.8*   No results for input(s): LIPASE, AMYLASE in the last 168 hours. Recent Labs  Lab 04/24/21 0046  AMMONIA 54*   CBC: Recent Labs  Lab 04/23/21 1600 04/24/21 0046 04/24/21 2043 04/25/21 0127 04/26/21 0143  WBC 3.2* 2.6* 2.7* 2.2* 2.4*  NEUTROABS 2.1 1.7  --  1.4* 1.4*  HGB 13.9 13.6 13.4 12.4 13.1  HCT 40.1 39.3 38.4 35.4* 36.9  MCV 96.9 95.9 94.1 95.4 94.6  PLT 64* 69* 66* 63* 60*   Cardiac Enzymes: No results for input(s): CKTOTAL, CKMB, CKMBINDEX, TROPONINI in the last 168 hours. BNP: BNP (last 3 results) No results for input(s): BNP in the last 8760 hours.  ProBNP (last 3 results) No results for input(s): PROBNP in the last 8760 hours.  CBG: Recent Labs  Lab 04/25/21 1951 04/26/21 0023 04/26/21 0434 04/26/21 0724 04/26/21 1150  GLUCAP 119* 92 84 79 108*       Signed:  Rhetta Mura MD   [facility] 04/26/2021, 12:20 PM

## 2021-04-26 NOTE — Discharge Instructions (Signed)
No dressing changes. Keep the dressing clean and dry. Take the antibiotics as directed.  My office will call you to schedule a follow-up appt. If you have any issues call [phone] to get in touch with me   Monitor for any signs/symptoms of infection. Signs of an infection could be redness beyond the site of the incision/procedure/wound, foul smelling odor, drainage that is thick and yellow or green, or severe swelling and pain. Call the office immediately if any occur or go directly to the emergency room. Call with any questions/concerns.

## 2021-04-26 NOTE — Progress Notes (Signed)
Pharmacy Antibiotic Note  Erika [name] is a 65 y.o. female admitted on 04/23/2021 with  suspected diabetic foot infection .  Pharmacy has been consulted for vancomycin dosing.  Podiatry amputated 5th toe on 6/24; initial OR cx growing GPCs. Will follow along for speciation/sensitivities. Podiatry recommending deescalating to oral antibiotics based on speciation, so pharmacy will not order vancomycin levels at this time.  Plan: -Continue vancomycin 750mg  q24hr -Continue cefepime 2 q8hr -Monitor CBC, renal fx, cultures and clinical progress   Height: 5\' 1"  (154.9 cm) Weight: 73.1 kg (161 lb 2.5 oz) IBW/kg (Calculated) : 47.8  Temp (24hrs), Avg:98 F (36.7 C), Min:97.7 F (36.5 C), Max:98.2 F (36.8 C)  Recent Labs  Lab 04/23/21 1600 04/23/21 1601 04/24/21 0046 04/24/21 2043 04/25/21 0127 04/26/21 0143  WBC 3.2*  --  2.6* 2.7* 2.2* 2.4*  CREATININE 0.83  --   --   --  0.85 0.74  LATICACIDVEN  --  1.4  --   --   --   --      Estimated Creatinine Clearance: 64.1 mL/min (by C-G formula based on SCr of 0.74 mg/dL).    No Known Allergies  Antimicrobials this admission: Ceftriaxone 6/23 >>  Vancomycin 6/23 >>   Microbiology results: 6/23 bcx ngtd 6/24 OR cx: rare GPCs 6/24 abscess cx: rare GPCs MRSA PCR neg    Thank you for allowing pharmacy to be a part of this patient's care.  7/24, PharmD PGY2 ID Pharmacy Resident Phone between 7 am - 3:30 pm: 7/24  Please check AMION for all [facility] Pharmacy phone numbers After 10:00 PM, call Main Pharmacy [phone]

## 2021-04-26 NOTE — Progress Notes (Signed)
  Subjective:  Patient ID: Erika [name], female    DOB: October 28, 1956,  MRN: [id number]  Postop day 2 from fifth toe amputation and fillet of toe flap feeling well, having minimal pain  Negative for chest pain and shortness of breath Fever: no Night sweats: no Objective:   Vitals:   04/25/21 2002 04/26/21 0736  BP: 136/65 (!) 167/99  Pulse: 65 63  Resp:  16  Temp:  97.7 F (36.5 C)  SpO2:  96%   General AA&O x3. Normal mood and affect.  Vascular Dorsalis pedis and posterior tibial pulses 2/4 bilat. Brisk capillary refill to all digits. Pedal hair present.  Neurologic Epicritic sensation grossly intact.  Dermatologic Dressing with some bloody strikethrough, minor maceration, flap is well perfused and warm  Orthopedic: MMT 5/5 i    Assessment & Plan:  Patient was evaluated and treated and all questions answered.  Status post fifth toe amputation and fillet of toe flap -WBAT to heel in postop shoe -Augmentin x14 days -No dressing changes until follow-up -Keep dressing clean dry and intact -We will have my office schedule her postop follow-up appointment next week  Edwin Cap, DPM  Accessible via secure chat for questions or concerns.

## 2021-04-26 NOTE — Plan of Care (Signed)
  Problem: Education: Goal: Knowledge of General Education information will improve Description: Including pain rating scale, medication(s)/side effects and non-pharmacologic comfort measures Outcome: Progressing   Problem: Pain Managment: Goal: General experience of comfort will improve Outcome: Progressing   

## 2021-04-26 NOTE — TOC Transition Note (Signed)
Transition of Care [facility]) - CM/SW Discharge Note   Patient Details  Name: Erika [name] MRN: [id number] Date of Birth: 07-06-1956  [facility] CM/SW Contact:  Bess Kinds, RN Phone Number: [phone] 04/26/2021, 12:08 PM   Clinical Narrative:     Spoke with patient on her room phone. HH PT/OT offered. Patient declined. Offered DME - patient declined stating that she has her cane. She stated that nurse was in there to give her discharge instructions and she was ready to go home. No further TOC needs identified.   Final next level of care: Home/Self Care Barriers to Discharge: No Barriers Identified   Patient Goals and CMS Choice Patient states their goals for this hospitalization and ongoing recovery are:: going home CMS Medicare.gov Compare Post Acute Care list provided to:: Patient Choice offered to / list presented to : Patient  Discharge Placement                       Discharge Plan and Services                DME Arranged: N/A DME Agency: NA       HH Arranged: Refused HH HH Agency: NA        Social Determinants of Health (SDOH) Interventions     Readmission Risk Interventions No flowsheet data found.

## 2021-04-26 NOTE — Progress Notes (Signed)
Discharge summary packet provided to pt with instructions. Pt verbalized understanding of instructions. All concerns and questions were fully answered. No complaints, D/C to home as ordered. Pt remains alert/oriented in no apparent distress. Son is responsible for pt's ride.

## 2021-04-29 LAB — CULTURE, BLOOD (ROUTINE X 2)
Culture: NO GROWTH
Culture: NO GROWTH
Special Requests: ADEQUATE
Special Requests: ADEQUATE

## 2021-04-29 LAB — AEROBIC/ANAEROBIC CULTURE W GRAM STAIN (SURGICAL/DEEP WOUND)

## 2021-04-30 LAB — PATHOLOGIST SMEAR REVIEW

## 2021-04-30 LAB — AEROBIC/ANAEROBIC CULTURE W GRAM STAIN (SURGICAL/DEEP WOUND)

## 2021-05-05 ENCOUNTER — Encounter: Payer: Self-pay | Admitting: Podiatry

## 2021-05-05 ENCOUNTER — Other Ambulatory Visit: Payer: Self-pay

## 2021-05-05 ENCOUNTER — Ambulatory Visit (INDEPENDENT_AMBULATORY_CARE_PROVIDER_SITE_OTHER): Payer: Medicare (Managed Care) | Admitting: Podiatry

## 2021-05-05 VITALS — BP 117/63 | HR 64 | Temp 96.5°F

## 2021-05-05 DIAGNOSIS — Z89421 Acquired absence of other right toe(s): Secondary | ICD-10-CM

## 2021-05-05 MED ORDER — OXYCODONE-ACETAMINOPHEN 5-325 MG PO TABS: 1.0000 | ORAL_TABLET | Freq: Two times a day (BID) | ORAL | 0 refills | Status: AC | PRN

## 2021-05-09 ENCOUNTER — Encounter: Payer: Self-pay | Admitting: Podiatry

## 2021-05-09 NOTE — Progress Notes (Signed)
  Subjective:  Patient ID: Erika [name], female    DOB: [birth year],  MRN: [id number]  Chief Complaint  Patient presents with   Post-op Problem    Routine post op - pt states there has been some bleeding not much/  some swelling and it has been throbbing     DOS: 04/24/2021 Procedure: Fillet of fifth toe flap  65 y.o. female returns for post-op check.  Overall doing well  Review of Systems: Negative except as noted in the HPI. Denies N/V/F/Ch.   Objective:   Vitals:   05/05/21 1342  BP: 117/63  Pulse: 64  Temp: (!) 96.5 F (35.8 C)   There is no height or weight on file to calculate BMI. Constitutional Well developed. Well nourished.  Vascular Foot warm and well perfused. Capillary refill normal to all digits.   Neurologic Normal speech. Oriented to person, place, and time. Epicritic sensation to light touch grossly present bilaterally.  Dermatologic Small area of delayed healing with drainage. Skin edges well coapted without signs of infection.  Orthopedic: Tenderness to palpation noted about the surgical site.    Assessment:   1. Status post amputation of lesser toe of right foot (HCC)    Plan:  Patient was evaluated and treated and all questions answered.  S/p foot surgery right -Progressing as expected post-operatively. -WB Status: WBAT in surgical shoe -Sutures: Remove at next visit. -Medications: Refill of pain medication -Foot redressed.  Return in about 1 week (around 05/12/2021) for suture removal.

## 2021-05-14 ENCOUNTER — Encounter: Payer: Medicare (Managed Care) | Admitting: Podiatry

## 2021-05-15 ENCOUNTER — Telehealth: Payer: Self-pay | Admitting: Podiatry

## 2021-05-15 MED ORDER — OXYCODONE-ACETAMINOPHEN 5-325 MG PO TABS: 1.0000 | ORAL_TABLET | Freq: Four times a day (QID) | ORAL | 0 refills | Status: DC | PRN

## 2021-05-15 NOTE — Telephone Encounter (Signed)
Patient requesting a refill on pain medication 

## 2021-05-15 NOTE — Addendum Note (Signed)
Addended by: Ventura Sellers on: 05/15/2021 05:29 PM   Modules accepted: Orders

## 2021-05-19 ENCOUNTER — Other Ambulatory Visit: Payer: Self-pay

## 2021-05-19 ENCOUNTER — Ambulatory Visit (INDEPENDENT_AMBULATORY_CARE_PROVIDER_SITE_OTHER): Payer: Medicare (Managed Care)

## 2021-05-19 ENCOUNTER — Ambulatory Visit (INDEPENDENT_AMBULATORY_CARE_PROVIDER_SITE_OTHER): Payer: Medicare (Managed Care) | Admitting: Podiatry

## 2021-05-19 DIAGNOSIS — G629 Polyneuropathy, unspecified: Secondary | ICD-10-CM

## 2021-05-19 DIAGNOSIS — L97521 Non-pressure chronic ulcer of other part of left foot limited to breakdown of skin: Secondary | ICD-10-CM

## 2021-05-21 ENCOUNTER — Telehealth: Payer: Self-pay | Admitting: Podiatry

## 2021-05-21 NOTE — Telephone Encounter (Signed)
Patient requesting a refill on pain medication 

## 2021-05-22 MED ORDER — OXYCODONE-ACETAMINOPHEN 5-325 MG PO TABS: 1.0000 | ORAL_TABLET | Freq: Four times a day (QID) | ORAL | 0 refills | Status: DC | PRN

## 2021-05-22 NOTE — Progress Notes (Signed)
  Subjective:  Patient ID: Erika [name], female    DOB: Aug 07, 1956,  MRN: [id number]  Chief Complaint  Patient presents with   Routine Post Op    Status post amputation right fifth toe   Foot Ulcer    New pre-ulcerative callus / ulcer left great toe    DOS: 04/24/2021 Procedure: Fillet of fifth toe flap  65 y.o. female returns for post-op check.  She has a new ulceration on the left great toe  Review of Systems: Negative except as noted in the HPI. Denies N/V/F/Ch.   Objective:   There were no vitals filed for this visit.  There is no height or weight on file to calculate BMI. Constitutional Well developed. Well nourished.  Vascular Foot warm and well perfused. Capillary refill normal to all digits.   Neurologic Normal speech. Oriented to person, place, and time. Epicritic sensation to light touch grossly present bilaterally.  Dermatologic Small area of delayed healing with fiber granular base.  No signs of infection.  She has a partial-thickness ulceration of the left hallux  Orthopedic: Tenderness to palpation noted about the surgical site.   Radiographs of the left foot show no evidence of osteomyelitis Assessment:   1. Skin ulcer of left great toe, limited to breakdown of skin ([place])   2. Peripheral polyneuropathy    Plan:  Patient was evaluated and treated and all questions answered.  S/p foot surgery right -Progressing as expected post-operatively. -WB Status: WBAT in surgical shoe -Sutures: Removed today -Medications: Refill of pain medication sent to her request -I believe she is getting these from some sort of neuropathy.  I have ordered a B12 test for her as well as ESR and CBC to evaluate for any signs of infection. -Currently no signs infection hold off on any antibiotics for now.  I would like to reevaluate her in 2 weeks.  Return in about 2 weeks (around 06/02/2021) for wound care.

## 2021-05-27 ENCOUNTER — Telehealth: Payer: Self-pay | Admitting: Podiatry

## 2021-05-27 MED ORDER — MUPIROCIN 2 % EX OINT: 1.0000 "application " | TOPICAL_OINTMENT | Freq: Two times a day (BID) | CUTANEOUS | 0 refills | Status: DC

## 2021-05-27 NOTE — Telephone Encounter (Signed)
Probably mupirocin. I sent to her pharmacy now. Thanks

## 2021-05-27 NOTE — Addendum Note (Signed)
Addended byLilian Kapur, Rolly Magri R on: 05/27/2021 04:02 PM   Modules accepted: Orders

## 2021-05-27 NOTE — Telephone Encounter (Signed)
Patient calling to request an ointment for her toes that was mentioned at last office visit. Patient does not remember the name of the medicated ointment. Please advise

## 2021-06-02 ENCOUNTER — Ambulatory Visit (INDEPENDENT_AMBULATORY_CARE_PROVIDER_SITE_OTHER): Payer: Medicare (Managed Care) | Admitting: Podiatry

## 2021-06-02 ENCOUNTER — Other Ambulatory Visit: Payer: Self-pay

## 2021-06-02 DIAGNOSIS — F17200 Nicotine dependence, unspecified, uncomplicated: Secondary | ICD-10-CM

## 2021-06-02 DIAGNOSIS — G629 Polyneuropathy, unspecified: Secondary | ICD-10-CM | POA: Diagnosis not present

## 2021-06-02 DIAGNOSIS — I739 Peripheral vascular disease, unspecified: Secondary | ICD-10-CM

## 2021-06-02 DIAGNOSIS — L97522 Non-pressure chronic ulcer of other part of left foot with fat layer exposed: Secondary | ICD-10-CM | POA: Diagnosis not present

## 2021-06-02 MED ORDER — OXYCODONE-ACETAMINOPHEN 5-325 MG PO TABS: 1.0000 | ORAL_TABLET | Freq: Two times a day (BID) | ORAL | 0 refills | Status: AC | PRN

## 2021-06-03 LAB — CBC WITH DIFFERENTIAL/PLATELET
Basophils Absolute: 0 10*3/uL (ref 0.0–0.2)
Basos: 0 %
EOS (ABSOLUTE): 0 10*3/uL (ref 0.0–0.4)
Eos: 0 %
Hematocrit: 43.9 % (ref 34.0–46.6)
Hemoglobin: 14.9 g/dL (ref 11.1–15.9)
Immature Grans (Abs): 0 10*3/uL (ref 0.0–0.1)
Immature Granulocytes: 0 %
Lymphocytes Absolute: 0.9 10*3/uL (ref 0.7–3.1)
Lymphs: 28 %
MCH: 32.4 pg (ref 26.6–33.0)
MCHC: 33.9 g/dL (ref 31.5–35.7)
MCV: 95 fL (ref 79–97)
Monocytes Absolute: 0.3 10*3/uL (ref 0.1–0.9)
Monocytes: 9 %
Neutrophils Absolute: 2 10*3/uL (ref 1.4–7.0)
Neutrophils: 63 %
Platelets: 47 10*3/uL — CL (ref 150–450)
RBC: 4.6 x10E6/uL (ref 3.77–5.28)
RDW: 13.6 % (ref 11.7–15.4)
WBC: 3.2 10*3/uL — ABNORMAL LOW (ref 3.4–10.8)

## 2021-06-03 LAB — SEDIMENTATION RATE: Sed Rate: 25 mm/hr (ref 0–40)

## 2021-06-03 LAB — VITAMIN B12: Vitamin B-12: 540 pg/mL (ref 232–1245)

## 2021-06-07 ENCOUNTER — Encounter: Payer: Self-pay | Admitting: Podiatry

## 2021-06-07 NOTE — Progress Notes (Signed)
  Subjective:  Patient ID: Erika [name], female    DOB: 27-Mar-1956,  MRN: [id number]  Chief Complaint  Patient presents with   wound care    wound care 2 weeks    DOS: 04/24/2021 Procedure: Fillet of fifth toe flap  65 y.o. female returns for post-op check.  Has a new blood blister and small ulceration on the fifth toe.  She says she has completed lab work but I have no record of this since May  Review of Systems: Negative except as noted in the HPI. Denies N/V/F/Ch.   Objective:   There were no vitals filed for this visit.  There is no height or weight on file to calculate BMI. Constitutional Well developed. Well nourished.  Vascular Foot warm and well perfused. Capillary refill normal to all digits.   Neurologic Normal speech. Oriented to person, place, and time. Epicritic sensation to light touch grossly present bilaterally.  Dermatologic Small area of delayed healing with fiber granular base.  No signs of infection.  She has a partial-thickness ulceration of the left hallux.  Small blood blister on the left fifth toe  Orthopedic: Tenderness to palpation noted about the surgical site.   Radiographs of the left foot show no evidence of osteomyelitis Assessment:   1. Skin ulcer of left great toe with fat layer exposed ([facility])   2. Peripheral polyneuropathy   3. Smoker   4. Peripheral arterial disease ([facility])    Plan:  Patient was evaluated and treated and all questions answered.  S/p foot surgery right -Continue mupirocin ointment to wounds -Right side is well-healed, left side is the concerning side now -She continues to develop these ulcerations.  I would like to be evaluated for peripheral arterial disease ABIs and TBI's with vascular ultrasound was ordered and an urgent referral for vascular surgery was given as well.  Return to see me after this. -Medications: Refill of pain medication sent to her request -Encouraged her to complete her lab work  Return in about  2 weeks (around 06/16/2021).

## 2021-06-12 ENCOUNTER — Encounter (HOSPITAL_COMMUNITY): Payer: Medicare (Managed Care)

## 2021-06-16 ENCOUNTER — Ambulatory Visit (INDEPENDENT_AMBULATORY_CARE_PROVIDER_SITE_OTHER): Payer: Medicare (Managed Care) | Admitting: Podiatry

## 2021-06-16 ENCOUNTER — Other Ambulatory Visit: Payer: Self-pay

## 2021-06-16 ENCOUNTER — Ambulatory Visit (INDEPENDENT_AMBULATORY_CARE_PROVIDER_SITE_OTHER): Payer: Medicare (Managed Care)

## 2021-06-16 DIAGNOSIS — I739 Peripheral vascular disease, unspecified: Secondary | ICD-10-CM

## 2021-06-16 DIAGNOSIS — F17200 Nicotine dependence, unspecified, uncomplicated: Secondary | ICD-10-CM | POA: Diagnosis not present

## 2021-06-16 DIAGNOSIS — L97522 Non-pressure chronic ulcer of other part of left foot with fat layer exposed: Secondary | ICD-10-CM

## 2021-06-16 MED ORDER — SULFAMETHOXAZOLE-TRIMETHOPRIM 800-160 MG PO TABS: 1.0000 | ORAL_TABLET | Freq: Two times a day (BID) | ORAL | 0 refills | Status: AC

## 2021-06-16 MED ORDER — OXYCODONE-ACETAMINOPHEN 5-325 MG PO TABS: 1.0000 | ORAL_TABLET | Freq: Two times a day (BID) | ORAL | 0 refills | Status: AC | PRN

## 2021-06-22 NOTE — Progress Notes (Signed)
  Subjective:  Patient ID: Erika [name], female    DOB: Feb 19, 1956,  MRN: [id number]  Chief Complaint  Patient presents with   Foot Ulcer    Skin ulcer left great toe, 2 week follow up    DOS: 04/24/2021 Procedure: Fillet of fifth toe flap  65 y.o. female returns for post-op check.  She completed the lab work.  She has not seen the vascular doctors or have the testing done yet  Review of Systems: Negative except as noted in the HPI. Denies N/V/F/Ch.   Objective:   There were no vitals filed for this visit.  There is no height or weight on file to calculate BMI. Constitutional Well developed. Well nourished.  Vascular Foot warm and well perfused. Capillary refill normal to all digits.   Neurologic Normal speech. Oriented to person, place, and time. Epicritic sensation to light touch grossly present bilaterally.  Dermatologic Small area of delayed healing with fiber granular base.  No signs of infection.  She has a partial-thickness ulceration of the left hallux.  Small blood blister on the left fifth toe  Orthopedic: Tenderness to palpation noted about the surgical site.   New radiographs of the left foot show no evidence of osteomyelitis  ESR 25 she has thrombocytopenia at 47 and white blood cell count of 3.2 Assessment:   1. Skin ulcer of left great toe with fat layer exposed ([place])    Plan:  Patient was evaluated and treated and all questions answered.  S/p foot surgery right -Continue mupirocin ointment to wounds -Rx for Bactrim -Arterial testing is scheduled for September 6, I have asked her to see if they are able to move this up at all for her.  If she has any issues she will let us know and we will try to coordinate with vein and vascular. -Medications: Refill of pain medication sent to her request -Encouraged her to complete her lab work  Return in about 1 week (around 06/23/2021) for wound care.

## 2021-06-25 ENCOUNTER — Encounter: Payer: Self-pay | Admitting: Podiatry

## 2021-06-25 ENCOUNTER — Telehealth: Payer: Self-pay | Admitting: Podiatry

## 2021-06-25 ENCOUNTER — Ambulatory Visit (INDEPENDENT_AMBULATORY_CARE_PROVIDER_SITE_OTHER): Payer: Medicare (Managed Care) | Admitting: Podiatry

## 2021-06-25 ENCOUNTER — Other Ambulatory Visit: Payer: Self-pay

## 2021-06-25 DIAGNOSIS — I739 Peripheral vascular disease, unspecified: Secondary | ICD-10-CM

## 2021-06-25 DIAGNOSIS — F17200 Nicotine dependence, unspecified, uncomplicated: Secondary | ICD-10-CM

## 2021-06-25 DIAGNOSIS — L97522 Non-pressure chronic ulcer of other part of left foot with fat layer exposed: Secondary | ICD-10-CM | POA: Diagnosis not present

## 2021-06-25 MED ORDER — MUPIROCIN 2 % EX OINT: 1.0000 "application " | TOPICAL_OINTMENT | Freq: Two times a day (BID) | CUTANEOUS | 0 refills | Status: DC

## 2021-06-25 NOTE — Telephone Encounter (Signed)
Patient is requesting pain medication  Please advise  

## 2021-06-26 MED ORDER — OXYCODONE-ACETAMINOPHEN 5-325 MG PO TABS: 1.0000 | ORAL_TABLET | Freq: Three times a day (TID) | ORAL | 0 refills | Status: AC | PRN

## 2021-06-26 NOTE — Addendum Note (Signed)
Addended byLilian Kapur, Nishanth Mccaughan R on: 06/26/2021 07:59 AM   Modules accepted: Orders

## 2021-06-29 NOTE — Progress Notes (Signed)
  Subjective:  Patient ID: Erika [name], female    DOB: 1956-02-25,  MRN: [id number]  Chief Complaint  Patient presents with   Foot Ulcer    1 week follow up left great toe ulcer    DOS: 04/24/2021 Procedure: Fillet of fifth toe flap  65 y.o. female returns for post-op check.  She completed the lab work.  She has not seen the vascular doctors or have the testing done yet  Review of Systems: Negative except as noted in the HPI. Denies N/V/F/Ch.   Objective:   There were no vitals filed for this visit.  There is no height or weight on file to calculate BMI. Constitutional Well developed. Well nourished.  Vascular Foot warm and well perfused. Capillary refill normal to all digits.   Neurologic Normal speech. Oriented to person, place, and time. Epicritic sensation to light touch grossly present bilaterally.  Dermatologic Small area of delayed healing with fiber granular base.  No signs of infection.  She has a partial-thickness ulceration of the left hallux.  Small blood blister on the left fifth toe  Orthopedic: Tenderness to palpation noted about the surgical site.  New radiographs of the left foot show no evidence of osteomyelitis  ESR 25 she has thrombocytopenia at 47 and white blood cell count of 3.2 Assessment:   1. Skin ulcer of left great toe with fat layer exposed ([place])   2. Smoker   3. Peripheral arterial disease ([place])    Plan:  Patient was evaluated and treated and all questions answered.  S/p foot surgery right -Continue mupirocin ointment to wounds -Arterial testing is scheduled for September 6, I have reordered this for [facility] center to see if and get her in any sooner -Medications: Refill of pain medication and mupirocin sent to her request -Encouraged her to complete her lab work  Return in about 2 weeks (around 07/09/2021) for wound care.

## 2021-07-03 ENCOUNTER — Telehealth: Payer: Self-pay | Admitting: *Deleted

## 2021-07-03 NOTE — Telephone Encounter (Signed)
Patient is calling to request an antibiotic (sulfamethoxazole Trimethoprim 800-160 mg), please advise/ send to pharmacy on file.

## 2021-07-06 NOTE — Progress Notes (Signed)
[facility] [place]  ASSESSMENT / PLAN: Erika [name] is a 65 y.o. female with unstageable left great toe ulceration and cellulitis.   Her non-invasive vascular studies predict she should heal foot surgery. I will see her back in a month to check on her. If she has poor healing or deteriorates rapidly, will have low threshold to do an angiogram.  CHIEF COMPLAINT: Left great toe ulcer  HISTORY OF PRESENT ILLNESS: Erika [name] is a 65 y.o. female referred to clinic from Dr. Lilian Kapur for evaluation of left great toe ulcer.  Patient reports a several week history of dorsal foot ulceration.  This is been associated with erythema and tenderness over the last week.  Patient reports no symptoms typical of intermittent claudication, ischemic rest pain, prior ischemic ulceration in the past.  She is an active smoker.  She underwent right fifth toe amputation for similar presentation 04/24/21.  Past Medical History:  Diagnosis Date   Arthritis    Depression    GERD (gastroesophageal reflux disease)    History of shingles 10/18/2018   Hypertension    Rheumatoid arthritis(714.0)     Past Surgical History:  Procedure Laterality Date   AMPUTATION TOE Right 04/24/2021   Procedure: AMPUTATION TOE;  Surgeon: Edwin Cap, DPM;  Location: [facility] OR;  Service: Podiatry;  Laterality: Right;   BREAST REDUCTION SURGERY     REDUCTION MAMMAPLASTY Bilateral     Family History  Problem Relation Age of Onset   Psoriasis Sister     Social History   Socioeconomic History   Marital status: Divorced    Spouse name: Not on file   Number of children: Not on file   Years of education: Not on file   Highest education level: Not on file  Occupational History   Not on file  Tobacco Use   Smoking status: Every Day    Packs/day: 0.50    Types: Cigarettes   Smokeless tobacco: Current  Vaping Use   Vaping Use: Never used  Substance and Sexual Activity   Alcohol use: Yes     Comment: sometimes none in a week   Drug use: Yes    Types: Marijuana    Comment: used last week   Sexual activity: Not on file  Other Topics Concern   Not on file  Social History Narrative   Not on file   Social Determinants of Health   Financial Resource Strain: Not on file  Food Insecurity: Not on file  Transportation Needs: Not on file  Physical Activity: Not on file  Stress: Not on file  Social Connections: Not on file  Intimate Partner Violence: Not on file    No Known Allergies  Current Outpatient Medications  Medication Sig Dispense Refill   acetaminophen (TYLENOL) 325 MG tablet Take 2 tablets (650 mg total) by mouth every 6 (six) hours as needed. 30 tablet 0   lamoTRIgine (LAMICTAL) 100 MG tablet Take 50 mg by mouth 2 (two) times daily.     mupirocin cream (BACTROBAN) 2 % Apply 1 application topically 2 (two) times daily. 15 g 0   mupirocin ointment (BACTROBAN) 2 % Apply 1 application topically 2 (two) times daily. 22 g 0   traZODone (DESYREL) 100 MG tablet Take 100 mg by mouth at bedtime.     venlafaxine XR (EFFEXOR-XR) 150 MG 24 hr capsule Take 150 mg by mouth daily with breakfast.     No current facility-administered medications for this visit.  REVIEW OF SYSTEMS:  [X]  denotes positive finding, [ ]  denotes negative finding Cardiac  Comments:  Chest pain or chest pressure:    Shortness of breath upon exertion:    Short of breath when lying flat:    Irregular heart rhythm:        Vascular    Pain in calf, thigh, or hip brought on by ambulation:    Pain in feet at night that wakes you up from your sleep:     Blood clot in your veins:    Leg swelling:  x       Pulmonary    Oxygen at home:    Productive cough:     Wheezing:         Neurologic    Sudden weakness in arms or legs:     Sudden numbness in arms or legs:     Sudden onset of difficulty speaking or slurred speech:    Temporary loss of vision in one eye:     Problems with dizziness:          Gastrointestinal    Blood in stool:     Vomited blood:         Genitourinary    Burning when urinating:     Blood in urine:        Psychiatric    Major depression:         Hematologic    Bleeding problems:    Problems with blood clotting too easily:        Skin    Rashes or ulcers:        Constitutional    Fever or chills:      PHYSICAL EXAM Vitals:   07/07/21 0832  BP: 106/69  Pulse: 70  Resp: 20  Temp: 97.9 F (36.6 C)  SpO2: 97%  Weight: 157 lb (71.2 kg)  Height: 5\' 1"  (1.549 m)    Constitutional: chronically ill appearing older than stated age. no distress. Appears well nourished.  Neurologic: CN intact. no focal findings. no sensory loss. Psychiatric: Mood and affect symmetric and appropriate. Eyes:  No icterus. No conjunctival pallor. Ears, nose, throat:  mucous membranes moist. Midline trachea.  Cardiac: regular rate and rhythm.  Respiratory:  unlabored. Abdominal:  soft, non-tender, non-distended.  Peripheral vascular: 2+ PT pulses bilaterally. No DP pulses. Left great toe erythematous. <1s capillary refill in left foot. Extremity: no edema. no cyanosis. no pallor.  Skin: no gangrene. + unstageable 1cm ulcer on plantar left great toe. Left fifth toe surgically absent.  Lymphatic: no Stemmer's sign. no palpable lymphadenopathy.  PERTINENT LABORATORY AND RADIOLOGIC DATA  Most recent CBC CBC Latest Ref Rng & Units 06/02/2021 04/26/2021 04/25/2021  WBC 3.4 - 10.8 x10E3/uL 3.2(L) 2.4(L) 2.2(L)  Hemoglobin 11.1 - 15.9 g/dL 08/02/2021 04/28/2021 04/27/2021  Hematocrit 34.0 - 46.6 % 43.9 36.9 35.4(L)  Platelets 150 - 450 x10E3/uL 47(LL) 60(L) 63(L)     Most recent CMP CMP Latest Ref Rng & Units 04/26/2021 04/25/2021 04/23/2021  Glucose 70 - 99 mg/dL 94 04/28/2021) 04/27/2021)  BUN 8 - 23 mg/dL 13 8 7(L)  Creatinine 04/25/2021 - 1.00 mg/dL 174(B 449(Q 7.59  Sodium 135 - 145 mmol/L 139 136 136  Potassium 3.5 - 5.1 mmol/L 4.3 4.0 3.7  Chloride 98 - 111 mmol/L 107 104 104  CO2 22 - 32 mmol/L 24  26 26   Calcium 8.9 - 10.3 mg/dL 9.0 1.63) 8.46)  Total Protein 6.5 - 8.1 g/dL 6.7 6.1(L) 6.5  Total  Bilirubin 0.3 - 1.2 mg/dL 4.5(W) 0.9(W) 1.0  Alkaline Phos 38 - 126 U/L 91 84 101  AST 15 - 41 U/L 38 33 28  ALT 0 - 44 U/L Renal function CrCl cannot be calculated (Patient's most recent lab result is older than the maximum 21 days allowed.).  Hgb A1c MFr Bld (%)  Date Value  04/24/2021 5.6    LDL Cholesterol  Date Value Ref Range Status  02/25/2010 81 0 - 99 mg/dL Final    Comment:    See lab report for associated comment(s)     Vascular Imaging: ABI 07/07/21: LOWER EXTREMITY DOPPLER STUDY   Patient Name:  JESSENIA FILIPPONE  Date of Exam:   07/07/2021  Medical Rec #: [id number]           Accession #:    [id number]  Date of Birth: 1956-06-08            Patient Gender: F  Patient Age:   30 years  Exam Location:  [facility]  Procedure:      VAS [place] ABI WITH/WO TBI  Referring Phys: ADAM MCDONALD    ---------------------------------------------------------------------------  -----     Indications: Ulceration.   High Risk Factors: Hypertension, current smoker.     Comparison Study: None   Performing Technologist: Ethelle Lyon      Examination Guidelines: A complete evaluation includes at minimum, Doppler  waveform signals and systolic blood pressure reading at the level of  bilateral  brachial, anterior tibial, and posterior tibial arteries, when vessel  segments  are accessible. Bilateral testing is considered an integral part of a  complete  examination. Photoelectric Plethysmograph (PPG) waveforms and toe systolic  pressure readings are included as required and additional duplex testing  as  needed. Limited examinations for reoccurring indications may be performed  as  noted.      ABI Findings:  +---------+------------------+-----+---------+--------+  Right    Rt Pressure (mmHg)IndexWaveform Comment    +---------+------------------+-----+---------+--------+  Brachial 147                                       +---------+------------------+-----+---------+--------+  PTA      162               1.10 triphasic          +---------+------------------+-----+---------+--------+  DP       174               1.18 triphasic          +---------+------------------+-----+---------+--------+  Great Toe170               1.16                    +---------+------------------+-----+---------+--------+   +---------+------------------+-----+---------+-------+  Left     Lt Pressure (mmHg)IndexWaveform Comment  +---------+------------------+-----+---------+-------+  Brachial 140                                      +---------+------------------+-----+---------+-------+  PTA      183               1.24 triphasic         +---------+------------------+-----+---------+-------+  DP       179  1.22 triphasic         +---------+------------------+-----+---------+-------+  Great Toe144               0.98                   +---------+------------------+-----+---------+-------+   +-------+-----------+-----------+------------+------------+  ABI/TBIToday's ABIToday's TBIPrevious ABIPrevious TBI  +-------+-----------+-----------+------------+------------+  Right  1.18       1.16                                 +-------+-----------+-----------+------------+------------+  Left   1.24       0.98                                 +-------+-----------+-----------+------------+------------+             Summary:  Right: Resting right ankle-brachial index is within normal range. No  evidence of significant right lower extremity arterial disease. The right  toe-brachial index is normal.   Left: Resting left ankle-brachial index is within normal range. No  evidence of significant left lower extremity arterial disease. The left   toe-brachial index is normal.       *See table(s) above for measurements and observations.       Electronically signed by Heath Lark on 07/07/2021 at 9:49:28 AM.   Rande Brunt. Lenell Antu, MD [facility] Phone Number: [phone] 07/06/2021 7:25 PM  Total time spent on preparing this encounter including chart review, data review, collecting history, examining the patient, coordinating care for this new patient, 60 minutes.  Portions of this report may have been transcribed using voice recognition software.  Every effort has been made to ensure accuracy. Inadvertent computerized transcription errors may still be present.

## 2021-07-07 ENCOUNTER — Encounter: Payer: Self-pay | Admitting: Vascular Surgery

## 2021-07-07 ENCOUNTER — Ambulatory Visit (INDEPENDENT_AMBULATORY_CARE_PROVIDER_SITE_OTHER)
Admission: RE | Admit: 2021-07-07 | Discharge: 2021-07-07 | Disposition: A | Discharge status: Home / Self Care | Payer: Medicare (Managed Care) | Source: Ambulatory Visit | Attending: Podiatry | Admitting: Podiatry

## 2021-07-07 ENCOUNTER — Telehealth: Payer: Self-pay | Admitting: *Deleted

## 2021-07-07 ENCOUNTER — Other Ambulatory Visit: Payer: Self-pay

## 2021-07-07 ENCOUNTER — Ambulatory Visit (INDEPENDENT_AMBULATORY_CARE_PROVIDER_SITE_OTHER): Payer: Medicare (Managed Care) | Admitting: Vascular Surgery

## 2021-07-07 VITALS — BP 106/69 | HR 70 | Temp 97.9°F | Resp 20 | Ht 61.0 in | Wt 157.0 lb

## 2021-07-07 DIAGNOSIS — L03116 Cellulitis of left lower limb: Secondary | ICD-10-CM | POA: Diagnosis not present

## 2021-07-07 DIAGNOSIS — L97522 Non-pressure chronic ulcer of other part of left foot with fat layer exposed: Secondary | ICD-10-CM

## 2021-07-07 DIAGNOSIS — L97529 Non-pressure chronic ulcer of other part of left foot with unspecified severity: Secondary | ICD-10-CM

## 2021-07-07 DIAGNOSIS — I739 Peripheral vascular disease, unspecified: Secondary | ICD-10-CM

## 2021-07-07 DIAGNOSIS — F17200 Nicotine dependence, unspecified, uncomplicated: Secondary | ICD-10-CM | POA: Insufficient documentation

## 2021-07-07 DIAGNOSIS — M86172 Other acute osteomyelitis, left ankle and foot: Secondary | ICD-10-CM | POA: Diagnosis not present

## 2021-07-07 MED ORDER — SULFAMETHOXAZOLE-TRIMETHOPRIM 800-160 MG PO TABS: 1.0000 | ORAL_TABLET | Freq: Two times a day (BID) | ORAL | 1 refills | Status: DC

## 2021-07-07 NOTE — Telephone Encounter (Signed)
Patient is calling for pain medicine, antibiotic refill, is in pain and foot is swollen.  Called pharmacy and they said that the antibiotic was there ready for pick up,patient has picked up pain medicine already. Explained to patient, said that she does not remember picking up pain medicine, will call pharmacy,foot is still swollen and in pain.

## 2021-07-07 NOTE — Telephone Encounter (Signed)
Returned the call to patient to give Dr Vara Guardian recommendations if foot gets worse to go to ER,verbalized understanding and said that she can't pick up medicine until daughter gets off work.

## 2021-07-08 ENCOUNTER — Other Ambulatory Visit: Payer: Self-pay

## 2021-07-08 DIAGNOSIS — L97529 Non-pressure chronic ulcer of other part of left foot with unspecified severity: Secondary | ICD-10-CM

## 2021-07-09 ENCOUNTER — Ambulatory Visit (INDEPENDENT_AMBULATORY_CARE_PROVIDER_SITE_OTHER): Payer: Medicare (Managed Care)

## 2021-07-09 ENCOUNTER — Other Ambulatory Visit: Payer: Self-pay

## 2021-07-09 ENCOUNTER — Ambulatory Visit (INDEPENDENT_AMBULATORY_CARE_PROVIDER_SITE_OTHER): Payer: Medicare (Managed Care) | Admitting: Podiatry

## 2021-07-09 DIAGNOSIS — L97522 Non-pressure chronic ulcer of other part of left foot with fat layer exposed: Secondary | ICD-10-CM

## 2021-07-09 DIAGNOSIS — L97524 Non-pressure chronic ulcer of other part of left foot with necrosis of bone: Secondary | ICD-10-CM | POA: Diagnosis not present

## 2021-07-09 DIAGNOSIS — F17200 Nicotine dependence, unspecified, uncomplicated: Secondary | ICD-10-CM

## 2021-07-09 MED ORDER — DOXYCYCLINE HYCLATE 100 MG PO TABS: 100.0000 mg | ORAL_TABLET | Freq: Two times a day (BID) | ORAL | 0 refills | Status: AC

## 2021-07-09 MED ORDER — LEVOFLOXACIN 750 MG PO TABS: 750.0000 mg | ORAL_TABLET | Freq: Every day | ORAL | 0 refills | Status: AC

## 2021-07-09 MED ORDER — OXYCODONE-ACETAMINOPHEN 10-325 MG PO TABS: 1.0000 | ORAL_TABLET | Freq: Three times a day (TID) | ORAL | 0 refills | Status: DC | PRN

## 2021-07-10 ENCOUNTER — Emergency Department (HOSPITAL_COMMUNITY): Payer: Medicare (Managed Care)

## 2021-07-10 ENCOUNTER — Telehealth: Payer: Self-pay | Admitting: Podiatry

## 2021-07-10 ENCOUNTER — Other Ambulatory Visit: Payer: Self-pay

## 2021-07-10 ENCOUNTER — Inpatient Hospital Stay (HOSPITAL_COMMUNITY)
Admission: EM | Admit: 2021-07-10 | Discharge: 2021-07-12 | DRG: 475 | Disposition: A | Discharge status: Home / Self Care | Payer: Medicare (Managed Care) | Attending: Internal Medicine | Admitting: Internal Medicine

## 2021-07-10 ENCOUNTER — Encounter (HOSPITAL_COMMUNITY): Payer: Self-pay

## 2021-07-10 ENCOUNTER — Encounter: Payer: Self-pay | Admitting: Podiatry

## 2021-07-10 DIAGNOSIS — L97529 Non-pressure chronic ulcer of other part of left foot with unspecified severity: Secondary | ICD-10-CM | POA: Diagnosis present

## 2021-07-10 DIAGNOSIS — Z9889 Other specified postprocedural states: Secondary | ICD-10-CM | POA: Diagnosis not present

## 2021-07-10 DIAGNOSIS — M009 Pyogenic arthritis, unspecified: Secondary | ICD-10-CM | POA: Diagnosis present

## 2021-07-10 DIAGNOSIS — Z79899 Other long term (current) drug therapy: Secondary | ICD-10-CM | POA: Diagnosis not present

## 2021-07-10 DIAGNOSIS — K760 Fatty (change of) liver, not elsewhere classified: Secondary | ICD-10-CM | POA: Diagnosis present

## 2021-07-10 DIAGNOSIS — M069 Rheumatoid arthritis, unspecified: Secondary | ICD-10-CM | POA: Diagnosis present

## 2021-07-10 DIAGNOSIS — F32A Depression, unspecified: Secondary | ICD-10-CM | POA: Diagnosis present

## 2021-07-10 DIAGNOSIS — Z8619 Personal history of other infectious and parasitic diseases: Secondary | ICD-10-CM

## 2021-07-10 DIAGNOSIS — I739 Peripheral vascular disease, unspecified: Secondary | ICD-10-CM | POA: Diagnosis present

## 2021-07-10 DIAGNOSIS — Z89421 Acquired absence of other right toe(s): Secondary | ICD-10-CM

## 2021-07-10 DIAGNOSIS — F17210 Nicotine dependence, cigarettes, uncomplicated: Secondary | ICD-10-CM | POA: Diagnosis present

## 2021-07-10 DIAGNOSIS — M86172 Other acute osteomyelitis, left ankle and foot: Secondary | ICD-10-CM | POA: Diagnosis present

## 2021-07-10 DIAGNOSIS — I10 Essential (primary) hypertension: Secondary | ICD-10-CM | POA: Diagnosis present

## 2021-07-10 DIAGNOSIS — L0889 Other specified local infections of the skin and subcutaneous tissue: Secondary | ICD-10-CM | POA: Diagnosis not present

## 2021-07-10 DIAGNOSIS — K219 Gastro-esophageal reflux disease without esophagitis: Secondary | ICD-10-CM | POA: Diagnosis present

## 2021-07-10 DIAGNOSIS — L03116 Cellulitis of left lower limb: Secondary | ICD-10-CM | POA: Diagnosis present

## 2021-07-10 DIAGNOSIS — D61818 Other pancytopenia: Secondary | ICD-10-CM | POA: Diagnosis present

## 2021-07-10 DIAGNOSIS — M869 Osteomyelitis, unspecified: Secondary | ICD-10-CM | POA: Diagnosis present

## 2021-07-10 DIAGNOSIS — Z20822 Contact with and (suspected) exposure to covid-19: Secondary | ICD-10-CM | POA: Diagnosis present

## 2021-07-10 DIAGNOSIS — F1010 Alcohol abuse, uncomplicated: Secondary | ICD-10-CM | POA: Diagnosis present

## 2021-07-10 DIAGNOSIS — Z720 Tobacco use: Secondary | ICD-10-CM | POA: Diagnosis present

## 2021-07-10 LAB — CBC WITH DIFFERENTIAL/PLATELET
Abs Immature Granulocytes: 0.01 10*3/uL (ref 0.00–0.07)
Basophils Absolute: 0 10*3/uL (ref 0.0–0.1)
Basophils Relative: 1 %
Eosinophils Absolute: 0 10*3/uL (ref 0.0–0.5)
Eosinophils Relative: 0 %
HCT: 31 % — ABNORMAL LOW (ref 36.0–46.0)
Hemoglobin: 10.1 g/dL — ABNORMAL LOW (ref 12.0–15.0)
Immature Granulocytes: 0 %
Lymphocytes Relative: 24 %
Lymphs Abs: 0.6 10*3/uL — ABNORMAL LOW (ref 0.7–4.0)
MCH: 32.6 pg (ref 26.0–34.0)
MCHC: 32.6 g/dL (ref 30.0–36.0)
MCV: 100 fL (ref 80.0–100.0)
Monocytes Absolute: 0.2 10*3/uL (ref 0.1–1.0)
Monocytes Relative: 9 %
Neutro Abs: 1.7 10*3/uL (ref 1.7–7.7)
Neutrophils Relative %: 66 %
Platelets: 63 10*3/uL — ABNORMAL LOW (ref 150–400)
RBC: 3.1 MIL/uL — ABNORMAL LOW (ref 3.87–5.11)
RDW: 13.6 % (ref 11.5–15.5)
WBC: 2.5 10*3/uL — ABNORMAL LOW (ref 4.0–10.5)
nRBC: 0 % (ref 0.0–0.2)

## 2021-07-10 LAB — COMPREHENSIVE METABOLIC PANEL
ALT: 18 U/L (ref 0–44)
AST: 27 U/L (ref 15–41)
Albumin: 2.2 g/dL — ABNORMAL LOW (ref 3.5–5.0)
Alkaline Phosphatase: 102 U/L (ref 38–126)
Anion gap: 4 — ABNORMAL LOW (ref 5–15)
BUN: 11 mg/dL (ref 8–23)
CO2: 22 mmol/L (ref 22–32)
Calcium: 8.2 mg/dL — ABNORMAL LOW (ref 8.9–10.3)
Chloride: 109 mmol/L (ref 98–111)
Creatinine, Ser: 0.92 mg/dL (ref 0.44–1.00)
GFR, Estimated: >60 mL/min (ref >60)
Glucose, Bld: 78 mg/dL (ref 70–99)
Potassium: 5 mmol/L (ref 3.5–5.1)
Sodium: 135 mmol/L (ref 135–145)
Total Bilirubin: 0.8 mg/dL (ref 0.3–1.2)
Total Protein: 6.2 g/dL — ABNORMAL LOW (ref 6.5–8.1)

## 2021-07-10 LAB — MAGNESIUM: Magnesium: 1.7 mg/dL (ref 1.7–2.4)

## 2021-07-10 LAB — CBC
HCT: 34.1 % — ABNORMAL LOW (ref 36.0–46.0)
Hemoglobin: 11.2 g/dL — ABNORMAL LOW (ref 12.0–15.0)
MCH: 32.9 pg (ref 26.0–34.0)
MCHC: 32.8 g/dL (ref 30.0–36.0)
MCV: 100.3 fL — ABNORMAL HIGH (ref 80.0–100.0)
Platelets: 72 10*3/uL — ABNORMAL LOW (ref 150–400)
RBC: 3.4 MIL/uL — ABNORMAL LOW (ref 3.87–5.11)
RDW: 13.4 % (ref 11.5–15.5)
WBC: 3 10*3/uL — ABNORMAL LOW (ref 4.0–10.5)
nRBC: 0 % (ref 0.0–0.2)

## 2021-07-10 LAB — BASIC METABOLIC PANEL
Anion gap: 4 — ABNORMAL LOW (ref 5–15)
BUN: 9 mg/dL (ref 8–23)
CO2: 23 mmol/L (ref 22–32)
Calcium: 8.2 mg/dL — ABNORMAL LOW (ref 8.9–10.3)
Chloride: 107 mmol/L (ref 98–111)
Creatinine, Ser: 1.01 mg/dL — ABNORMAL HIGH (ref 0.44–1.00)
GFR, Estimated: >60 mL/min (ref >60)
Glucose, Bld: 173 mg/dL — ABNORMAL HIGH (ref 70–99)
Potassium: 4.5 mmol/L (ref 3.5–5.1)
Sodium: 134 mmol/L — ABNORMAL LOW (ref 135–145)

## 2021-07-10 LAB — PROTIME-INR
INR: 1.3 — ABNORMAL HIGH (ref 0.8–1.2)
Prothrombin Time: 15.9 seconds — ABNORMAL HIGH (ref 11.4–15.2)

## 2021-07-10 LAB — RETICULOCYTES
Immature Retic Fract: 10.4 % (ref 2.3–15.9)
RBC.: 3.05 MIL/uL — ABNORMAL LOW (ref 3.87–5.11)
Retic Count, Absolute: 86.3 10*3/uL (ref 19.0–186.0)
Retic Ct Pct: 2.8 % (ref 0.4–3.1)

## 2021-07-10 LAB — RESP PANEL BY RT-PCR (FLU A&B, COVID) ARPGX2
Influenza A by PCR: NEGATIVE
Influenza B by PCR: NEGATIVE
SARS Coronavirus 2 by RT PCR: NEGATIVE

## 2021-07-10 LAB — PHOSPHORUS: Phosphorus: 3.1 mg/dL (ref 2.5–4.6)

## 2021-07-10 LAB — SEDIMENTATION RATE: Sed Rate: 73 mm/hr — ABNORMAL HIGH (ref 0–22)

## 2021-07-10 LAB — VITAMIN B12: Vitamin B-12: 635 pg/mL (ref 180–914)

## 2021-07-10 LAB — FERRITIN: Ferritin: 265 ng/mL (ref 11–307)

## 2021-07-10 LAB — C-REACTIVE PROTEIN: CRP: 4.1 mg/dL — ABNORMAL HIGH (ref <1.0)

## 2021-07-10 LAB — APTT: aPTT: 30 seconds (ref 24–36)

## 2021-07-10 MED ORDER — VANCOMYCIN HCL IN DEXTROSE 1-5 GM/200ML-% IV SOLN
1000.0000 mg | INTRAVENOUS | Status: DC
  Administered 2021-07-11 – 2021-07-12 (×2): 1000 mg via INTRAVENOUS
  Filled 2021-07-10 (×3): qty 200

## 2021-07-10 MED ORDER — ONDANSETRON HCL 4 MG PO TABS: 4.0000 mg | ORAL_TABLET | Freq: Four times a day (QID) | ORAL | Status: DC | PRN

## 2021-07-10 MED ORDER — ACETAMINOPHEN 325 MG PO TABS: 650.0000 mg | ORAL_TABLET | Freq: Four times a day (QID) | ORAL | Status: DC | PRN

## 2021-07-10 MED ORDER — TRAZODONE HCL 100 MG PO TABS
100.0000 mg | ORAL_TABLET | Freq: Every day | ORAL | Status: DC
  Administered 2021-07-10 – 2021-07-11 (×2): 100 mg via ORAL
  Filled 2021-07-10: qty 2
  Filled 2021-07-10: qty 1

## 2021-07-10 MED ORDER — OXYCODONE-ACETAMINOPHEN 5-325 MG PO TABS
1.0000 | ORAL_TABLET | Freq: Three times a day (TID) | ORAL | Status: DC | PRN
  Administered 2021-07-11 – 2021-07-12 (×4): 1 via ORAL
  Filled 2021-07-10 (×4): qty 1

## 2021-07-10 MED ORDER — LAMOTRIGINE 100 MG PO TABS
50.0000 mg | ORAL_TABLET | Freq: Two times a day (BID) | ORAL | Status: DC
  Administered 2021-07-10 – 2021-07-12 (×3): 50 mg via ORAL
  Filled 2021-07-10 (×2): qty 1
  Filled 2021-07-10: qty 2

## 2021-07-10 MED ORDER — OXYCODONE-ACETAMINOPHEN 10-325 MG PO TABS: 1.0000 | ORAL_TABLET | Freq: Three times a day (TID) | ORAL | Status: DC | PRN

## 2021-07-10 MED ORDER — POLYETHYLENE GLYCOL 3350 17 G PO PACK: 17.0000 g | PACK | Freq: Every day | ORAL | Status: DC | PRN

## 2021-07-10 MED ORDER — ACETAMINOPHEN 650 MG RE SUPP: 650.0000 mg | Freq: Four times a day (QID) | RECTAL | Status: DC | PRN

## 2021-07-10 MED ORDER — ADULT MULTIVITAMIN W/MINERALS CH
1.0000 | ORAL_TABLET | Freq: Every day | ORAL | Status: DC
  Administered 2021-07-10 – 2021-07-12 (×3): 1 via ORAL
  Filled 2021-07-10 (×3): qty 1

## 2021-07-10 MED ORDER — FOLIC ACID 1 MG PO TABS
1.0000 mg | ORAL_TABLET | Freq: Every day | ORAL | Status: DC
  Administered 2021-07-10 – 2021-07-12 (×3): 1 mg via ORAL
  Filled 2021-07-10 (×3): qty 1

## 2021-07-10 MED ORDER — VANCOMYCIN HCL 1500 MG/300ML IV SOLN
1500.0000 mg | Freq: Once | INTRAVENOUS | Status: AC
  Administered 2021-07-10: 1500 mg via INTRAVENOUS
  Filled 2021-07-10: qty 300

## 2021-07-10 MED ORDER — THIAMINE HCL 100 MG PO TABS
100.0000 mg | ORAL_TABLET | Freq: Every day | ORAL | Status: DC
  Administered 2021-07-11 – 2021-07-12 (×2): 100 mg via ORAL
  Filled 2021-07-10 (×3): qty 1

## 2021-07-10 MED ORDER — THIAMINE HCL 100 MG/ML IJ SOLN
100.0000 mg | Freq: Every day | INTRAMUSCULAR | Status: DC
  Administered 2021-07-10: 100 mg via INTRAVENOUS
  Filled 2021-07-10 (×2): qty 2

## 2021-07-10 MED ORDER — SODIUM CHLORIDE 0.9 % IV SOLN
2.0000 g | Freq: Two times a day (BID) | INTRAVENOUS | Status: DC
  Administered 2021-07-10 – 2021-07-12 (×5): 2 g via INTRAVENOUS
  Filled 2021-07-10 (×5): qty 2

## 2021-07-10 MED ORDER — ONDANSETRON HCL 4 MG/2ML IJ SOLN: 4.0000 mg | Freq: Four times a day (QID) | INTRAMUSCULAR | Status: DC | PRN

## 2021-07-10 MED ORDER — PREGABALIN 100 MG PO CAPS
300.0000 mg | ORAL_CAPSULE | Freq: Two times a day (BID) | ORAL | Status: DC
  Administered 2021-07-11 – 2021-07-12 (×2): 300 mg via ORAL
  Filled 2021-07-10 (×3): qty 3

## 2021-07-10 MED ORDER — OXYCODONE HCL 5 MG PO TABS
5.0000 mg | ORAL_TABLET | Freq: Three times a day (TID) | ORAL | Status: DC | PRN
  Administered 2021-07-10 – 2021-07-11 (×2): 5 mg via ORAL
  Filled 2021-07-10 (×2): qty 1

## 2021-07-10 MED ORDER — VENLAFAXINE HCL ER 150 MG PO CP24
150.0000 mg | ORAL_CAPSULE | Freq: Every day | ORAL | Status: DC
  Administered 2021-07-12: 150 mg via ORAL
  Filled 2021-07-10 (×2): qty 1

## 2021-07-10 NOTE — Progress Notes (Signed)
Pharmacy Antibiotic Note  Erika [name] is a 65 y.o. female admitted on 07/10/2021 presenting with L-toe wound, osteomyelitis on x-ray. Pharmacy has been consulted for vancomycin and cefepime dosing.  Plan: Vancomycin 1500 mg IV x 1, then 1000 mg IV q 24h (eAUC 526, Goal AUC 400-550, SCr 1.01) Cefepime 2g IV q 12h Monitor renal function, Cx and any plans for surgical intervention Vancomycin levels as needed  Height: 5' (152.4 cm) Weight: 70.3 kg (155 lb) IBW/kg (Calculated) : 45.5  Temp (24hrs), Avg:97.9 F (36.6 C), Min:97.9 F (36.6 C), Max:97.9 F (36.6 C)  Recent Labs  Lab 07/10/21 0859  WBC 3.0*  CREATININE 1.01*    Estimated Creatinine Clearance: 48.6 mL/min (A) (by C-G formula based on SCr of 1.01 mg/dL (H)).    No Known Allergies  Daylene Posey, PharmD Clinical Pharmacist ED Pharmacist Phone # [phone] 07/10/2021 11:12 AM

## 2021-07-10 NOTE — Telephone Encounter (Signed)
Patient needs to be admitted due to osteomyelitis. Does the patient need a consult or do they need to be admitted. Please call Dr. Audley Hose  ASAP

## 2021-07-10 NOTE — ED Notes (Signed)
Pt has 3+ swelling of 1st left toe and 2+ swelling of left foot. Pt has wound on posterior of 1st left toe 1cmx0.8cm. Foot is warm to touch, 1+ left pedal pulse, pt able to wiggle toes and walk.

## 2021-07-10 NOTE — ED Triage Notes (Signed)
Pt sent by doctor for further evaluation of wound on left big toe that has been there for several weeks. 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 Denies hx of diabetes, denies and injury or trauma to toe.

## 2021-07-10 NOTE — H&P (Addendum)
Date: 07/10/2021               Patient Name:  Erika [name] MRN: [id number]  DOB: 1956/10/27 Age / Sex: 65 y.o., female   PCP: Alvester Chou, NP         Medical Service: Internal Medicine Teaching Service         Attending Physician: Dr. Evette Doffing, Mallie Mussel, *    First Contact: Christiana Fuchs, DO Pager: MV [phone]  Second Contact: Hadassah Pais, MD Pager: Rudean Curt [phone]       After Hours (After 5p/  First Contact Pager: [phone]  weekends / holidays): Second Contact Pager: [phone]   SUBJECTIVE  Chief Complaint: Foot wound  History of Present Illness: Erika [name] is a 65 y.o. female with a pertinent PMH of RA, osteomyelitis of the right 5th toes s/p ambutation, and GERD, who presents to [facility] with left foot wound.  Patient was seen by her podiatrist, Dr. Sherryle Lis on 8/25 for a post-op appointment for her right 5th digit amputation for osteomylitis. At that appointment patient was found to have a left first digit wound. She was put on oral antibiotics with ofloxacin and doxycycline and asked to follow-up in 1 week.  During that time the patient was sent to vascular surgery for evaluation of lower extremity peripheral artery disease.  She was seen by Dr. Luan Pulling last week with ABIs that were within normal limits in her bilateral lower extremities.  At her 1 week follow-up the wound did not appear to be improved and she was instructed to go to the ED for further evaluation and management.    Currently patient admits to pain surrounding the first digit, but denies any systemic signs of infection such as fever, chills, night sweats, myalgias.  She is unsure why she has gotten so many lower extremity wounds.  She does endorse a history of tobacco use disorder, but denies any trauma to the area, IV drug use, history of diabetes.  Her have a history of alcohol use but only endorses drinking 10 ounces of beer daily.  Recent right upper quadrant ultrasound does show evidence of hepatic steatosis  without cirrhosis.   Medications: No current facility-administered medications on file prior to encounter.   Current Outpatient Medications on File Prior to Encounter  Medication Sig Dispense Refill  . acetaminophen (TYLENOL) 325 MG tablet Take 2 tablets (650 mg total) by mouth every 6 (six) hours as needed. 30 tablet 0  . doxycycline (VIBRA-TABS) 100 MG tablet Take 1 tablet (100 mg total) by mouth 2 (two) times daily for 5 days. 10 tablet 0  . lamoTRIgine (LAMICTAL) 100 MG tablet Take 50 mg by mouth 2 (two) times daily.    [name] levofloxacin (LEVAQUIN) 750 MG tablet Take 1 tablet (750 mg total) by mouth daily for 5 days. 5 tablet 0  . mupirocin cream (BACTROBAN) 2 % Apply 1 application topically 2 (two) times daily. 15 g 0  . mupirocin ointment (BACTROBAN) 2 % Apply 1 application topically 2 (two) times daily. 22 g 0  . oxyCODONE-acetaminophen (PERCOCET) 10-325 MG tablet Take 1 tablet by mouth every 8 (eight) hours as needed for up to 3 days for pain. 9 tablet 0  . pregabalin (LYRICA) 300 MG capsule Take 300 mg by mouth 2 (two) times daily.    . traZODone (DESYREL) 100 MG tablet Take 100 mg by mouth at bedtime.    [name] venlafaxine XR (EFFEXOR-XR) 150 MG 24 hr capsule Take 150 mg by  mouth daily with breakfast.      Past Medical History:  Past Medical History:  Diagnosis Date  . Arthritis   . Depression   . GERD (gastroesophageal reflux disease)   . History of shingles 10/18/2018  . Hypertension   . Rheumatoid arthritis(714.0)     Social:  Lives - [place], [place] Occupation - disability  Support - She lives with her daughter Level of function - Independent with ADLs PCP - Alvester Chou Substance use - Tobacco, ETOH, occasional cannibis  Family History: Family History  Problem Relation Age of Onset  . Psoriasis Sister     Allergies: Allergies as of 07/10/2021  . (No Known Allergies)    Review of Systems: A complete ROS was negative except as per HPI.   OBJECTIVE:  Physical  Exam: Blood pressure 119/68, pulse 72, temperature 97.8 F (36.6 C), temperature source Oral, resp. rate 15, height 5' (1.524 m), weight 70.3 kg, SpO2 100 %. Physical Exam Constitutional:      Appearance: Normal appearance.  HENT:     Head: Normocephalic and atraumatic.     Mouth/Throat:     Mouth: Mucous membranes are moist.     Pharynx: Oropharynx is clear.  Eyes:     Extraocular Movements: Extraocular movements intact.  Cardiovascular:     Rate and Rhythm: Normal rate and regular rhythm.     Pulses: Normal pulses.     Heart sounds: Normal heart sounds.  Pulmonary:     Effort: Pulmonary effort is normal.     Breath sounds: Normal breath sounds.  Abdominal:     General: There is no distension.     Palpations: Abdomen is soft.  Musculoskeletal:        General: Swelling (left lower extremity) present. Normal range of motion.     Cervical back: Normal range of motion.  Skin:    Findings: Lesion (first digit of left toe ulcer with surrounding erythema) present.  Neurological:     General: No focal deficit present.     Mental Status: She is alert and oriented to person, place, and time.   Media Information Document Information  Photos    07/10/2021 12:33  Attached To:  Hospital Encounter on 07/10/21   Source Information  Marianna Payment, MD  [facility]   Media Information Document Information  Photos    07/10/2021 12:33  Attached To:  Hospital Encounter on 07/10/21   Source Information  Marianna Payment, MD  [facility]   Media Information Document Information  Photos    07/10/2021 12:34  Attached To:  Hospital Encounter on 07/10/21   Source Information  Marianna Payment, MD  [facility]   Pertinent Labs: CBC    Component Value Date/Time   WBC 3.0 (L) 07/10/2021 0859   RBC 3.40 (L) 07/10/2021 0859   HGB 11.2 (L) 07/10/2021 0859   HGB 14.9 06/02/2021 1551   HCT 34.1 (L) 07/10/2021 0859   HCT 43.9 06/02/2021 1551   PLT 72 (L)  07/10/2021 0859   PLT 47 (LL) 06/02/2021 1551   MCV 100.3 (H) 07/10/2021 0859   MCV 95 06/02/2021 1551   MCH 32.9 07/10/2021 0859   MCHC 32.8 07/10/2021 0859   RDW 13.4 07/10/2021 0859   RDW 13.6 06/02/2021 1551   LYMPHSABS 0.9 06/02/2021 1551   MONOABS 0.3 04/26/2021 0143   EOSABS 0.0 06/02/2021 1551   BASOSABS 0.0 06/02/2021 1551     CMP     Component Value Date/Time   NA 134 (L)  07/10/2021 0859   K 4.5 07/10/2021 0859   CL 107 07/10/2021 0859   CO2 23 07/10/2021 0859   GLUCOSE 173 (H) 07/10/2021 0859   BUN 9 07/10/2021 0859   CREATININE 1.01 (H) 07/10/2021 0859   CALCIUM 8.2 (L) 07/10/2021 0859   PROT 6.7 04/26/2021 0143   ALBUMIN 2.8 (L) 04/26/2021 0143   AST 38 04/26/2021 0143   ALT 22 04/26/2021 0143   ALKPHOS 91 04/26/2021 0143   BILITOT 1.5 (H) 04/26/2021 0143   GFRNONAA >60 07/10/2021 0859    Pertinent Imaging: DG Foot Complete Left  Result Date: 07/10/2021 CLINICAL DATA:  Infection EXAM: LEFT FOOT - COMPLETE 3+ VIEW COMPARISON:  Radiograph 07/09/2021 and 06/16/2021 FINDINGS: There is bony destruction at the great toe interphalangeal joint with extensive fragmentation mainly of the proximal phalanx. There is adjacent soft tissue swelling. Moderate first MTP degenerative arthritis. Unchanged splaying of the second and third toes and overlapping fourth and fifth toes. Large plantar calcaneal spur. Forefoot soft tissue swelling. IMPRESSION: Bony destruction at the great toe IP joint, consistent with septic arthritis and osteomyelitis. Electronically Signed   By: Maurine Simmering M.D.   On: 07/10/2021 10:49    EKG: personally reviewed my interpretation is normal EKG, normal sinus rhythm, unchanged from previous tracings  ASSESSMENT & PLAN:  Assessment: Active Problems:   Osteomyelitis ([place])   Erika [name] is a 65 y.o. with pertinent PMH of RA, osteomyelitis of the right 5th toes s/p ambutation, and GERD, who presented with left 1st digit wound and admit for  osteomyelitis of her left first digit on hospital day 0  Plan: #Osteomyelitis Patient presents with a left first digit wound with x-ray imaging consistent with osteomyelitis.  Patient's podiatrist, Dr. Sherryle Lis was consulted in the ED and will evaluate the patient for possible amputation.  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  Patient has previous wound cultures from her past amputation growing staph aureus and epidermidis that was resistant to clindamycin, erythromycin, oxacillin, tetracycline. She was started on broad-spectrum antibiotics with vancomycin and cefepime and is tolerating it well.  Patient was recently prescribed Percocet for pain in her lower extremities.  I will continue this regiment while she is admitted.  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  Previous B12 levels have been within normal limits.  Additionally, she has a platelet count of 72.  Blood smear was performed back in June 2022 with evidence of leukopenia, neutropenia with toxic granulations.  Her most recent ferritin without evidence of hemolysis.  She previously had elevated ESR and CRP levels consistent with osteomyelitis which could likely be contributing to her pancytopenia.  She also history of alcohol use with right upper quadrant ultrasound showing hepatic steatosis that may be contributing to her low counts.  Currently there is no evidence of bleeding on history or exam. - Will get ferritin, B12, and reticulocyte count -PT/INR and  PTT pending - Hold off on pharm DVT ppx until PT/INR results.   #ETOH use: Has a history of alcohol use although  denies any history of withdrawal.  We will start her on CIWA without Ativan and supplement B12/folate. -Well without Ativan -Plan B12 and folate  #Polypharmacy: Patient has a history of polypharmacy currently on Lamictal 50 mg twice daily, pregabalin 300 mg twice daily, trazodone 100 mg nightly, and Effexor 150 mg daily.  Currently being managed by her primary care NP, Alvester Chou.  Based on this medication regiment patient is high risk for adverse events including sedation, falls, and serotonin syndrome.  It is unclear why the patient is on so many centrally acting medications.  She does have a history of depression and denies chronic pain other than her lower extremities.  Additionally, she denies any history of seizures.  I would recommend titrating down some of her medications as possible.  Best Practice: Diet: Regular diet IVF: none VTE: SCDs Start: 07/10/21 1413SCDs Code: Full AB: vanc and cefepime Status: Inpatient with expected length of stay greater than 2 midnights. Anticipated Discharge Location: Home Barriers to Discharge: IV antibiotics and Pending surgery  Signature: Lawerance Cruel, D.O.  Internal Medicine Resident, PGY-3 [facility]  Pager: [phone] 2:37 PM, 07/10/2021   Please contact the on call pager after 5 pm and on weekends at [phone].

## 2021-07-10 NOTE — ED Notes (Signed)
Patient transported to X-ray 

## 2021-07-10 NOTE — Consult Note (Signed)
WOC Nurse Consult Note: Patient receiving care in [facility] [phone]. Patient followed by Podiatry. Reason for Consult: left foot wound Wound type: left great toe wound, "IMPRESSION: Bony destruction at the great toe IP joint, consistent with septic arthritis and osteomyelitis." Per xray results from today.  Pressure Injury POA: Yes/No/NA Measurement: Wound bed: Drainage (amount, consistency, odor)  Periwound: Dressing procedure/placement/frequency: For now I have ordered washing the wound with soap and water, patting dry, placing a small foam dressing over the site.    I believe there are plans for surgery to the foot.  WOC nurse will not follow at this time.  Please re-consult the WOC team if needed.  Helmut Muster, RN, MSN, CWOCN, CNS-BC, pager [phone]

## 2021-07-10 NOTE — Progress Notes (Signed)
  Subjective:  Patient ID: Erika [name], female    DOB: April 04, 1956,  MRN: [id number]  Chief Complaint  Patient presents with   Foot Ulcer    2 week follow up ulcer left great toe    DOS: 04/24/2021 Procedure: Fillet of fifth toe flap  65 y.o. female returns for post-op check.  She did get the vascular testing and evaluation done she says the toe is doing much worse  Review of Systems: Negative except as noted in the HPI. Denies N/V/F/Ch.   Objective:   There were no vitals filed for this visit.  There is no height or weight on file to calculate BMI. Constitutional Well developed. Well nourished.  Vascular Foot warm and well perfused. Capillary refill normal to all digits.   Neurologic Normal speech. Oriented to person, place, and time. Epicritic sensation to light touch grossly present bilaterally.  Dermatologic Today the toe is much worse with significant cellulitis to the metatarsophalangeal joint there is crepitance on palpation and a large bulla formed under the wound that has seropurulent drainage with malodor  Orthopedic: Tenderness to palpation noted about the surgical site.  New radiographs of the left foot show gross bony destruction of distal and proximal phalanx of left great toe  ESR 25 she has thrombocytopenia at 47 and white blood cell count of 3.2  Noninvasive vascular testing with adequate perfusion Assessment:   1. Skin ulcer of left great toe with necrosis of bone ([place])   2. Smoker    Plan:  Patient was evaluated and treated and all questions answered.   Unfortunately has worsened quite a bit and has developed gross osteomyelitis and destruction of the distal and proximal phalanges.  I advised her to go urgently to the emergency room she said she had something to take care of and wanted to go on Monday.  She did say she would eventually go tomorrow.  I put her on doxycycline and Levaquin and took a wound culture of the drainage.  Discussed with her  she most likely will be losing at least the big toe hopeful to spread the infection then I do not recommend delaying care any further as it can lead to increasing limb loss and/or sepsis.  I did prescribe her some pain medication as well  No follow-ups on file.

## 2021-07-10 NOTE — Consult Note (Signed)
Podiatry Consult Note  To: Dr. Tyson Alias, MD Reason for consult: Left toe infection From: Dr. Asencion Islam (Podiatry)   HPI: Erika [name] is a 65 y.o. female patient who seen at bedside for evaluation of left great toe infection.  Patient was seen in office on yesterday by Dr. Lilian Kapur and was referred to the ER for admission due to worsening infection.  Patient currently on doxycycline and Levaquin which she picked up on yesterday. Patient denies current pain nausea vomiting fever or chills or any other constitutional symptoms at this time.   Patient Active Problem List   Diagnosis Date Noted   Osteomyelitis (HCC) 07/10/2021   Pancytopenia (HCC) 07/10/2021   Hepatic steatosis 07/10/2021   Osteomyelitis of fifth toe of right foot (HCC) 04/23/2021   Alcohol use disorder 04/23/2021   Hepatomegaly 04/23/2021   Thrombocytopenia (HCC) 04/23/2021   GERD (gastroesophageal reflux disease) 04/23/2021   Tobacco abuse 04/23/2021   History of shingles 10/18/2018   Rheumatoid arthritis (HCC) 10/18/2018   Rash and nonspecific skin eruption 10/18/2018   Numbness and tingling 10/18/2018   Depression 10/18/2018    No current facility-administered medications on file prior to encounter.   Current Outpatient Medications on File Prior to Encounter  Medication Sig Dispense Refill   doxycycline (VIBRA-TABS) 100 MG tablet Take 1 tablet (100 mg total) by mouth 2 (two) times daily for 5 days. 10 tablet 0   lamoTRIgine (LAMICTAL) 100 MG tablet Take 50 mg by mouth 2 (two) times daily.     levofloxacin (LEVAQUIN) 750 MG tablet Take 1 tablet (750 mg total) by mouth daily for 5 days. 5 tablet 0   mupirocin ointment (BACTROBAN) 2 % Apply 1 application topically 2 (two) times daily. 22 g 0   oxyCODONE-acetaminophen (PERCOCET) 10-325 MG tablet Take 1 tablet by mouth every 8 (eight) hours as needed for up to 3 days for pain. (Patient taking differently: Take 0.5-1 tablets by mouth every 8  (eight) hours as needed for pain.) 9 tablet 0   pregabalin (LYRICA) 300 MG capsule Take 300 mg by mouth 2 (two) times daily.     traZODone (DESYREL) 100 MG tablet Take 100 mg by mouth at bedtime.     venlafaxine XR (EFFEXOR-XR) 150 MG 24 hr capsule Take 150 mg by mouth daily with breakfast.      No Known Allergies  Past Surgical History:  Procedure Laterality Date   AMPUTATION TOE Right 04/24/2021   Procedure: AMPUTATION TOE;  Surgeon: Edwin Cap, DPM;  Location: [facility] OR;  Service: Podiatry;  Laterality: Right;   BREAST REDUCTION SURGERY     REDUCTION MAMMAPLASTY Bilateral     Family History  Problem Relation Age of Onset   Psoriasis Sister     Social History   Socioeconomic History   Marital status: Divorced    Spouse name: Not on file   Number of children: Not on file   Years of education: Not on file   Highest education level: Not on file  Occupational History   Not on file  Tobacco Use   Smoking status: Every Day    Packs/day: 0.50    Types: Cigarettes   Smokeless tobacco: Current  Vaping Use   Vaping Use: Never used  Substance and Sexual Activity   Alcohol use: Yes    Comment: sometimes none in a week   Drug use: Yes    Types: Marijuana    Comment: used last week   Sexual activity: Not on file  Other Topics Concern   Not on file  Social History Narrative   Not on file   Social Determinants of Health   Financial Resource Strain: Not on file  Food Insecurity: Not on file  Transportation Needs: Not on file  Physical Activity: Not on file  Stress: Not on file  Social Connections: Not on file  Intimate Partner Violence: Not on file     Objective:  Today's Vitals   07/10/21 0855 07/10/21 1130 07/10/21 1459 07/10/21 1700  BP:  119/68  (!) 107/57  Pulse:  72  (!) 57  Resp:  15  13  Temp:  97.8 F (36.6 C)    TempSrc:  Oral    SpO2:  100%  96%  Weight: 70.3 kg     Height: 5' (1.524 m)     PainSc: 10-Worst pain ever  10-Worst pain ever    Body  mass index is 30.27 kg/m.   General: Alert and oriented x3 in no acute distress  Dermatology: 1 cm crusted over eschared wound to the plantar left hallux with significant periwound edema erythema warmth localized to the great toe.  There is no crepitus or fluctuance noted.  No active drainage.  There is a small abrasion noted to the lateral left fifth toe without signs of acute infection.     Vascular: Dorsalis Pedis and Posterior Tibial pedal pulses palpable, Capillary Fill Time 3 seconds in all remaining digits,(+) pedal hair growth bilateral, focal edema to the left great toe with warmth.  Neurology: Michaell Cowing sensation intact via light touch bilateral.  Musculoskeletal: Minimal tenderness to palpation to left great toe.  Status post right fifth toe amputation.  Results Xrays  Left foot IMPRESSION: Bony destruction at the great toe IP joint, consistent with septic arthritis and osteomyelitis.    Results for orders placed or performed during the hospital encounter of 07/10/21  Resp Panel by RT-PCR (Flu A&B, Covid) Nasopharyngeal Swab     Status: None   Collection Time: 07/10/21 10:35 AM   Specimen: Nasopharyngeal Swab; Nasopharyngeal(NP) swabs in vial transport medium  Result Value Ref Range Status   SARS Coronavirus 2 by RT PCR NEGATIVE NEGATIVE Final    Comment: (NOTE) SARS-CoV-2 target nucleic acids are NOT DETECTED.  The SARS-CoV-2 RNA is generally detectable in upper respiratory specimens during the acute phase of infection. The lowest concentration of SARS-CoV-2 viral copies this assay can detect is 138 copies/mL. A negative result does not preclude SARS-Cov-2 infection and should not be used as the sole basis for treatment or other patient management decisions. A negative result may occur with  improper specimen collection/handling, submission of specimen other than nasopharyngeal swab, presence of viral mutation(s) within the areas targeted by this assay, and  inadequate number of viral copies(<138 copies/mL). A negative result must be combined with clinical observations, patient history, and epidemiological information. The expected result is Negative.  Fact Sheet for Patients:  BloggerCourse.com  Fact Sheet for Healthcare Providers:  SeriousBroker.it  This test is no t yet approved or cleared by the [place] FDA and  has been authorized for detection and/or diagnosis of SARS-CoV-2 by FDA under an Emergency Use Authorization (EUA). This EUA will remain  in effect (meaning this test can be used) for the duration of the COVID-19 declaration under Section 564(b)(1) of the Act, 21 U.S.C.section 360bbb-3(b)(1), unless the authorization is terminated  or revoked sooner.       Influenza A by PCR NEGATIVE NEGATIVE Final   Influenza B by  PCR NEGATIVE NEGATIVE Final    Comment: (NOTE) The Xpert Xpress SARS-CoV-2/FLU/RSV plus assay is intended as an aid in the diagnosis of influenza from Nasopharyngeal swab specimens and should not be used as a sole basis for treatment. Nasal washings and aspirates are unacceptable for Xpert Xpress SARS-CoV-2/FLU/RSV testing.  Fact Sheet for Patients: BloggerCourse.com  Fact Sheet for Healthcare Providers: SeriousBroker.it  This test is not yet approved or cleared by the [place] FDA and has been authorized for detection and/or diagnosis of SARS-CoV-2 by FDA under an Emergency Use Authorization (EUA). This EUA will remain in effect (meaning this test can be used) for the duration of the COVID-19 declaration under Section 564(b)(1) of the Act, 21 U.S.C. section 360bbb-3(b)(1), unless the authorization is terminated or revoked.  Performed at [facility], [address] [place], [place] [address]      Assessment and Plan: Osteomyelitis left great toe Cellulitis left great toe Chronic  left great toe ulceration Smoker  -Complete examination performed -Xrays reviewed -Discussed treatment options for osteomyelitis of the left great toe with chronic ulceration -Discussed with patient need for a lot of treatment and management.  Patient agreeable for left great toe amputation with removal of all infected bone or soft tissue.  Patient to be n.p.o. after midnight.  All questions answered risk alternatives benefits explained no guarantees given or implied. -Previous vascular recommendations reviewed -Recommend rest and elevation for pain and edema control -Recommend continue with medical management and IV antibiotics -Patient to be weightbearing to heel  with surgical shoe -Consult appreciated -Podiatry to follow   Dr. Asencion Islam, DPM [facility] [phone] office [phone] cell  Time spent with patient for exam and coordination of care:   31 mins

## 2021-07-10 NOTE — ED Notes (Signed)
Called pharmacy to verify meds.

## 2021-07-11 ENCOUNTER — Encounter (HOSPITAL_COMMUNITY): Payer: Self-pay | Admitting: Student in an Organized Health Care Education/Training Program

## 2021-07-11 ENCOUNTER — Inpatient Hospital Stay (HOSPITAL_COMMUNITY): Payer: Medicare (Managed Care)

## 2021-07-11 ENCOUNTER — Inpatient Hospital Stay (HOSPITAL_COMMUNITY): Payer: Medicare (Managed Care) | Admitting: Certified Registered Nurse Anesthetist

## 2021-07-11 ENCOUNTER — Encounter (HOSPITAL_COMMUNITY)
Admission: EM | Disposition: A | Discharge status: Home / Self Care | Payer: Self-pay | Source: Home / Self Care | Attending: Student in an Organized Health Care Education/Training Program

## 2021-07-11 DIAGNOSIS — M86172 Other acute osteomyelitis, left ankle and foot: Secondary | ICD-10-CM

## 2021-07-11 HISTORY — PX: AMPUTATION TOE: SHX6595

## 2021-07-11 LAB — BASIC METABOLIC PANEL
Anion gap: 5 (ref 5–15)
BUN: 12 mg/dL (ref 8–23)
CO2: 22 mmol/L (ref 22–32)
Calcium: 8.5 mg/dL — ABNORMAL LOW (ref 8.9–10.3)
Chloride: 110 mmol/L (ref 98–111)
Creatinine, Ser: 0.94 mg/dL (ref 0.44–1.00)
GFR, Estimated: >60 mL/min (ref >60)
Glucose, Bld: 101 mg/dL — ABNORMAL HIGH (ref 70–99)
Potassium: 4.5 mmol/L (ref 3.5–5.1)
Sodium: 137 mmol/L (ref 135–145)

## 2021-07-11 LAB — CBC
HCT: 32.6 % — ABNORMAL LOW (ref 36.0–46.0)
Hemoglobin: 10.9 g/dL — ABNORMAL LOW (ref 12.0–15.0)
MCH: 32.8 pg (ref 26.0–34.0)
MCHC: 33.4 g/dL (ref 30.0–36.0)
MCV: 98.2 fL (ref 80.0–100.0)
Platelets: 67 10*3/uL — ABNORMAL LOW (ref 150–400)
RBC: 3.32 MIL/uL — ABNORMAL LOW (ref 3.87–5.11)
RDW: 13.6 % (ref 11.5–15.5)
WBC: 2.7 10*3/uL — ABNORMAL LOW (ref 4.0–10.5)
nRBC: 0 % (ref 0.0–0.2)

## 2021-07-11 LAB — SURGICAL PCR SCREEN
MRSA, PCR: NEGATIVE
Staphylococcus aureus: NEGATIVE

## 2021-07-11 LAB — GLUCOSE, CAPILLARY: Glucose-Capillary: 102 mg/dL — ABNORMAL HIGH (ref 70–99)

## 2021-07-11 SURGERY — AMPUTATION, TOE
Anesthesia: Monitor Anesthesia Care | Site: Toe | Laterality: Left

## 2021-07-11 MED ORDER — CHLORHEXIDINE GLUCONATE 0.12 % MT SOLN
OROMUCOSAL | Status: AC
  Administered 2021-07-11: 15 mL via OROMUCOSAL
  Filled 2021-07-11: qty 15

## 2021-07-11 MED ORDER — PHENYLEPHRINE HCL-NACL 20-0.9 MG/250ML-% IV SOLN
INTRAVENOUS | Status: DC | PRN
Start: 2021-07-11 — End: 2021-07-13
  Administered 2021-07-11: 25 ug/min via INTRAVENOUS

## 2021-07-11 MED ORDER — LIDOCAINE 2% (20 MG/ML) 5 ML SYRINGE
INTRAMUSCULAR | Status: AC
  Filled 2021-07-11: qty 5

## 2021-07-11 MED ORDER — LIDOCAINE HCL (PF) 1 % IJ SOLN
INTRAMUSCULAR | Status: AC
  Filled 2021-07-11: qty 30

## 2021-07-11 MED ORDER — EPHEDRINE SULFATE-NACL 50-0.9 MG/10ML-% IV SOSY
PREFILLED_SYRINGE | INTRAVENOUS | Status: DC | PRN
  Administered 2021-07-11 (×5): 5 mg via INTRAVENOUS

## 2021-07-11 MED ORDER — MIDAZOLAM HCL 5 MG/5ML IJ SOLN
INTRAMUSCULAR | Status: DC | PRN
  Administered 2021-07-11: 2 mg via INTRAVENOUS

## 2021-07-11 MED ORDER — CHLORHEXIDINE GLUCONATE 0.12 % MT SOLN: 15.0000 mL | Freq: Once | OROMUCOSAL | Status: AC

## 2021-07-11 MED ORDER — ONDANSETRON HCL 4 MG/2ML IJ SOLN
INTRAMUSCULAR | Status: DC | PRN
  Administered 2021-07-11: 4 mg via INTRAVENOUS

## 2021-07-11 MED ORDER — FENTANYL CITRATE (PF) 250 MCG/5ML IJ SOLN
INTRAMUSCULAR | Status: AC
  Filled 2021-07-11: qty 5

## 2021-07-11 MED ORDER — MUPIROCIN 2 % EX OINT: 1.0000 "application " | TOPICAL_OINTMENT | Freq: Two times a day (BID) | CUTANEOUS | Status: DC

## 2021-07-11 MED ORDER — MIDAZOLAM HCL 2 MG/2ML IJ SOLN
INTRAMUSCULAR | Status: AC
  Filled 2021-07-11: qty 2

## 2021-07-11 MED ORDER — OXYCODONE HCL 5 MG PO TABS: 5.0000 mg | ORAL_TABLET | Freq: Once | ORAL | Status: DC | PRN

## 2021-07-11 MED ORDER — EPHEDRINE 5 MG/ML INJ
INTRAVENOUS | Status: AC
  Filled 2021-07-11: qty 5

## 2021-07-11 MED ORDER — PROPOFOL 500 MG/50ML IV EMUL
INTRAVENOUS | Status: DC | PRN
  Administered 2021-07-11: 75 ug/kg/min via INTRAVENOUS

## 2021-07-11 MED ORDER — PHENYLEPHRINE 40 MCG/ML (10ML) SYRINGE FOR IV PUSH (FOR BLOOD PRESSURE SUPPORT)
PREFILLED_SYRINGE | INTRAVENOUS | Status: DC | PRN
  Administered 2021-07-11: 80 ug via INTRAVENOUS

## 2021-07-11 MED ORDER — AMISULPRIDE (ANTIEMETIC) 5 MG/2ML IV SOLN: 10.0000 mg | Freq: Once | INTRAVENOUS | Status: DC | PRN

## 2021-07-11 MED ORDER — OXYCODONE HCL 5 MG/5ML PO SOLN: 5.0000 mg | Freq: Once | ORAL | Status: DC | PRN

## 2021-07-11 MED ORDER — DEXAMETHASONE SODIUM PHOSPHATE 10 MG/ML IJ SOLN
INTRAMUSCULAR | Status: AC
  Filled 2021-07-11: qty 1

## 2021-07-11 MED ORDER — CHLORHEXIDINE GLUCONATE CLOTH 2 % EX PADS: 6.0000 | MEDICATED_PAD | Freq: Once | CUTANEOUS | Status: DC

## 2021-07-11 MED ORDER — FENTANYL CITRATE (PF) 100 MCG/2ML IJ SOLN: 25.0000 ug | INTRAMUSCULAR | Status: DC | PRN

## 2021-07-11 MED ORDER — ONDANSETRON HCL 4 MG/2ML IJ SOLN
INTRAMUSCULAR | Status: AC
  Filled 2021-07-11: qty 2

## 2021-07-11 MED ORDER — ACETAMINOPHEN 10 MG/ML IV SOLN: 1000.0000 mg | Freq: Once | INTRAVENOUS | Status: DC | PRN

## 2021-07-11 MED ORDER — BUPIVACAINE HCL (PF) 0.25 % IJ SOLN
INTRAMUSCULAR | Status: AC
  Filled 2021-07-11: qty 30

## 2021-07-11 MED ORDER — LIDOCAINE HCL (PF) 1 % IJ SOLN
INTRAMUSCULAR | Status: DC | PRN
  Administered 2021-07-11: 10 mL

## 2021-07-11 MED ORDER — CHLORHEXIDINE GLUCONATE CLOTH 2 % EX PADS
6.0000 | MEDICATED_PAD | Freq: Once | CUTANEOUS | Status: AC
  Administered 2021-07-11: 6 via TOPICAL

## 2021-07-11 MED ORDER — ACETAMINOPHEN 160 MG/5ML PO SOLN: 325.0000 mg | ORAL | Status: DC | PRN

## 2021-07-11 MED ORDER — PROPOFOL 10 MG/ML IV BOLUS
INTRAVENOUS | Status: AC
  Filled 2021-07-11: qty 40

## 2021-07-11 MED ORDER — PROMETHAZINE HCL 25 MG/ML IJ SOLN: 6.2500 mg | INTRAMUSCULAR | Status: DC | PRN

## 2021-07-11 MED ORDER — FENTANYL CITRATE (PF) 250 MCG/5ML IJ SOLN
INTRAMUSCULAR | Status: DC | PRN
  Administered 2021-07-11: 50 ug via INTRAVENOUS

## 2021-07-11 MED ORDER — BUPIVACAINE HCL 0.25 % IJ SOLN
INTRAMUSCULAR | Status: DC | PRN
  Administered 2021-07-11: 10 mL

## 2021-07-11 MED ORDER — ORAL CARE MOUTH RINSE: 15.0000 mL | Freq: Once | OROMUCOSAL | Status: AC

## 2021-07-11 MED ORDER — LACTATED RINGERS IV SOLN: INTRAVENOUS | Status: DC

## 2021-07-11 MED ORDER — 0.9 % SODIUM CHLORIDE (POUR BTL) OPTIME
TOPICAL | Status: DC | PRN
  Administered 2021-07-11: 1000 mL

## 2021-07-11 MED ORDER — ACETAMINOPHEN 325 MG PO TABS: 325.0000 mg | ORAL_TABLET | ORAL | Status: DC | PRN

## 2021-07-11 MED ORDER — ROCURONIUM BROMIDE 10 MG/ML (PF) SYRINGE
PREFILLED_SYRINGE | INTRAVENOUS | Status: AC
  Filled 2021-07-11: qty 10

## 2021-07-11 SURGICAL SUPPLY — 29 items
BLADE AVERAGE 25X9 (BLADE) ×1 IMPLANT
BLADE SAW SGTL NAR THIN XSHT (BLADE) ×1 IMPLANT
BLADE SURG 10 STRL SS (BLADE) ×2 IMPLANT
BNDG COHESIVE 4X5 TAN STRL (GAUZE/BANDAGES/DRESSINGS) ×1 IMPLANT
BNDG ELASTIC 3X5.8 VLCR STR LF (GAUZE/BANDAGES/DRESSINGS) ×1 IMPLANT
BNDG ELASTIC 4X5.8 VLCR STR LF (GAUZE/BANDAGES/DRESSINGS) ×1 IMPLANT
BNDG GAUZE ELAST 4 BULKY (GAUZE/BANDAGES/DRESSINGS) ×2 IMPLANT
COVER SURGICAL LIGHT HANDLE (MISCELLANEOUS) ×2 IMPLANT
CUFF TOURN SGL QUICK 18X4 (TOURNIQUET CUFF) ×2 IMPLANT
DRSG PAD ABDOMINAL 8X10 ST (GAUZE/BANDAGES/DRESSINGS) ×1 IMPLANT
GAUZE SPONGE 4X4 12PLY STRL (GAUZE/BANDAGES/DRESSINGS) ×1 IMPLANT
GLOVE SURG ENC MOIS LTX SZ6.5 (GLOVE) ×2 IMPLANT
GLOVE SURG UNDER LTX SZ6.5 (GLOVE) ×2 IMPLANT
GOWN STRL REUS W/ TWL LRG LVL3 (GOWN DISPOSABLE) ×2 IMPLANT
GOWN STRL REUS W/TWL LRG LVL3 (GOWN DISPOSABLE) ×6
KIT BASIN OR (CUSTOM PROCEDURE TRAY) ×2 IMPLANT
KIT TURNOVER KIT B (KITS) ×2 IMPLANT
NDL HYPO 25GX1X1/2 BEV (NEEDLE) ×1 IMPLANT
NEEDLE HYPO 25GX1X1/2 BEV (NEEDLE) ×2 IMPLANT
NS IRRIG 1000ML POUR BTL (IV SOLUTION) ×2 IMPLANT
PACK ORTHO EXTREMITY (CUSTOM PROCEDURE TRAY) ×2 IMPLANT
PAD ARMBOARD 7.5X6 YLW CONV (MISCELLANEOUS) ×4 IMPLANT
SPONGE T-LAP 4X18 ~~LOC~~+RFID (SPONGE) ×1 IMPLANT
STAPLER SKIN 35 REG (STAPLE) ×1 IMPLANT
SUT PROLENE 3 0 PS 2 (SUTURE) ×4 IMPLANT
SUT VIC AB 2-0 SH 27 (SUTURE) ×2
SUT VIC AB 2-0 SH 27XBRD (SUTURE) ×1 IMPLANT
SYR CONTROL 10ML LL (SYRINGE) ×2 IMPLANT
TUBE CONNECTING 12X1/4 (SUCTIONS) ×2 IMPLANT

## 2021-07-11 NOTE — Progress Notes (Signed)
Patient is back from PACU. AXOX4.  RA

## 2021-07-11 NOTE — H&P (Signed)
H&P: Podiatry   YPP:[id number] I Barrantes 65 y.o. female  patient with a history of left great toe infection seen on Thursday by Dr. Sherryle Lis and sent to ER for worsening toe infection. Patient met this AM in pre-op holding area; informed consent reviewed and signed. All questions answered. Left foot/surgical site marked.  This am patient denies any other pedal complaints. Confirms NPO since midnight.     Patient Active Problem List   Diagnosis Date Noted   Osteomyelitis ([place]) 07/10/2021   Pancytopenia ([place]) 07/10/2021   Hepatic steatosis 07/10/2021   Osteomyelitis of fifth toe of right foot ([place]) 04/23/2021   Alcohol use disorder 04/23/2021   Hepatomegaly 04/23/2021   Thrombocytopenia ([place]) 04/23/2021   GERD (gastroesophageal reflux disease) 04/23/2021   Tobacco abuse 04/23/2021   History of shingles 10/18/2018   Rheumatoid arthritis ([place]) 10/18/2018   Rash and nonspecific skin eruption 10/18/2018   Numbness and tingling 10/18/2018   Depression 10/18/2018    No current facility-administered medications on file prior to encounter.   Current Outpatient Medications on File Prior to Encounter  Medication Sig Dispense Refill   doxycycline (VIBRA-TABS) 100 MG tablet Take 1 tablet (100 mg total) by mouth 2 (two) times daily for 5 days. 10 tablet 0   lamoTRIgine (LAMICTAL) 100 MG tablet Take 50 mg by mouth 2 (two) times daily.     levofloxacin (LEVAQUIN) 750 MG tablet Take 1 tablet (750 mg total) by mouth daily for 5 days. 5 tablet 0   mupirocin ointment (BACTROBAN) 2 % Apply 1 application topically 2 (two) times daily. 22 g 0   oxyCODONE-acetaminophen (PERCOCET) 10-325 MG tablet Take 1 tablet by mouth every 8 (eight) hours as needed for up to 3 days for pain. (Patient taking differently: Take 0.5-1 tablets by mouth every 8 (eight) hours as needed for pain.) 9 tablet 0   pregabalin (LYRICA) 300 MG capsule Take 300 mg by mouth 2 (two) times daily.     traZODone (DESYREL) 100 MG tablet Take  100 mg by mouth at bedtime.     venlafaxine XR (EFFEXOR-XR) 150 MG 24 hr capsule Take 150 mg by mouth daily with breakfast.         No Known Allergies  Social History   Socioeconomic History   Marital status: Divorced    Spouse name: Not on file   Number of children: Not on file   Years of education: Not on file   Highest education level: Not on file  Occupational History   Not on file  Tobacco Use   Smoking status: Every Day    Packs/day: 0.50    Types: Cigarettes   Smokeless tobacco: Current  Vaping Use   Vaping Use: Never used  Substance and Sexual Activity   Alcohol use: Yes    Alcohol/week: 5.0 standard drinks    Types: 5 Cans of beer per week    Comment: sometimes - none in a week   Drug use: Yes    Types: Marijuana    Comment: used last week 07/04/21   Sexual activity: Not on file  Other Topics Concern   Not on file  Social History Narrative   Not on file   Social Determinants of Health   Financial Resource Strain: Not on file  Food Insecurity: Not on file  Transportation Needs: Not on file  Physical Activity: Not on file  Stress: Not on file  Social Connections: Not on file  Intimate Partner Violence: Not on file    Past  Surgical History:  Procedure Laterality Date   AMPUTATION TOE Right 04/24/2021   Procedure: AMPUTATION TOE;  Surgeon: Criselda Peaches, DPM;  Location: [facility];  Service: Podiatry;  Laterality: Right;   BREAST REDUCTION SURGERY     REDUCTION MAMMAPLASTY Bilateral     Family History  Problem Relation Age of Onset   Psoriasis Sister      REVIEW OF SYSTEMS: Noncontributory.    PHYSICAL EXAMINATION:   Today's Vitals   07/11/21 0643 07/11/21 0743 07/11/21 0902 07/11/21 1125  BP:   (!) 100/55 (!) 109/44  Pulse:   (!) 52 (!) 59  Resp:   16 16  Temp:   98 F (36.7 C) 98.1 F (36.7 C)  TempSrc:   Oral Oral  SpO2:   96% 98%  Weight:    70.3 kg  Height:    5' (1.524 m)  PainSc: 10-Worst pain ever Asleep  0-No pain    GENERAL:   Well-developed, well-nourished, in no acute distress.  Alert  and cooperative.  LOWER EXTREMITY EXAM: DERMATOLOGY: Bandage on left great toe NEUROVASCULAR: Unchanged from yesterday's exam MUSCULOSKELETAL: +foot deformity/ pain with palpation to area of concern   XRAY, LEFT FOOT- IMPRESSION: Bony destruction at the great toe IP joint, consistent with septic arthritis and osteomyelitis.    ASSESSMENTS:  1. Osteomyelitis, Left great toe 2. Chronic plantar Left great toe ulcer    PLAN OF CARE: Patient seen and evaluated 1. History and physical completed 2. Patient NPO since midnight 3. Previous Imaging reviewed     4. Consent for surgery explained and obtained for left great toe amputation with removal of all infected soft tissue and bone; risks and benefits explained previously; all questions answered and no guarantees granted. 5. Patient to undergo above surgical procedure 6. Case discussed with patient. No family present and patient does not want me to call anyone on her behalf 7. To return to medical floor post procedure for continued management.  Landis Martins, DPM [facility] [phone] office [phone] cell

## 2021-07-11 NOTE — Progress Notes (Signed)
Home medication sent to pharmacy. See paper chart for copy. The pharmacy sealed bag number [id number].

## 2021-07-11 NOTE — Brief Op Note (Signed)
07/11/2021  2:01 PM  PATIENT:  Erika [name]  65 y.o. female  PRE-OPERATIVE DIAGNOSIS:  Osteomyelitis  POST-OPERATIVE DIAGNOSIS:  Osteomyelitis  PROCEDURE:  Procedure(s): AMPUTATION TOE (Left)  SURGEON:  Surgeon(s) and Role:    Cannon Kettle, Lady Saucier, DPM - Primary  PHYSICIAN ASSISTANT:   ASSISTANTS: none   ANESTHESIA:   MAC with local: Pre and post op injection 10cc 1% lidocaine and 0.25% marcaine plain for total of 20cc  EBL:  5cc  BLOOD ADMINISTERED:none  DRAINS: none   LOCAL MEDICATIONS USED:  MARCAINE & LIDOCAINE    SPECIMEN:  Source of Specimen:  Left great toe and metatarsal  DISPOSITION OF SPECIMEN:  PATHOLOGY  COUNTS:  YES  TOURNIQUET:   Total Tourniquet Time Documented: Ankle (Left) - 29 minutes Total: Ankle (Left) - 29 minutes   DICTATION: .Note written in [place]: Admit to inpatient  (Return to medical floor)  PATIENT DISPOSITION:  PACU - hemodynamically stable.   Delay start of Pharmacological VTE agent (>24hrs) due to surgical blood loss or risk of bleeding: no

## 2021-07-11 NOTE — Anesthesia Postprocedure Evaluation (Signed)
Anesthesia Post Note  Patient: Erika [name]  Procedure(s) Performed: AMPUTATION TOE (Left: Toe)     Patient location during evaluation: PACU Anesthesia Type: MAC Level of consciousness: awake and alert Pain management: pain level controlled Vital Signs Assessment: post-procedure vital signs reviewed and stable Respiratory status: spontaneous breathing, nonlabored ventilation, respiratory function stable and patient connected to nasal cannula oxygen Cardiovascular status: stable and blood pressure returned to baseline Postop Assessment: no apparent nausea or vomiting Anesthetic complications: no   No notable events documented.  Last Vitals:  Vitals:   07/11/21 1437 07/11/21 1514  BP: (!) 103/59 (!) 94/52  Pulse: (!) 59 (!) 58  Resp: (!) 9 14  Temp: (!) 36.3 C (!) 36.4 C  SpO2: 98% 100%    Last Pain:  Vitals:   07/11/21 1801  TempSrc:   PainSc: 0-No pain                 Effie Berkshire

## 2021-07-11 NOTE — Op Note (Signed)
DATE: 07-11-21  SURGEON: Landis Martins, DPM  PREOPERATIVE DIAGNOSIS:  Left hallux wound with osteomyelitis   POSTOPERATIVE DIAGNOSIS: Same  PROCEDURE PERFORMED: Left hallux and distal metatarsal amputation   HEMOSTASIS:  Left ankle tourniquet  ESTIMATED BLOOD LOSS:  Minimal  ANESTHESIA:  MAC with local 20cc of 1:1 mixture 1% lidocaine and 0.5% marcaine plain  SPECIMENS: Bone and soft tissue- path  COMPLICATIONS:  None.  INDICATIONS FOR PROCEDURE:  This patient is a pleasant 65 y.o. female who has been seen for infected toe with wound in outpatient clinic that worsened requiring admission for osteomyelitis.  Patient consents for amputation. Risks and complications include but are not limited to infection, recurrence of symptoms, pain, numbness, wound dehiscence, delayed healing, as well as need for future surgery/further amputation.  No guarantees were given or applied.  All questions were answered to the patient's satisfaction, and the patient has consented to the above procedure.  All preoperative labs and H&P, clearances have been obtained as she is currently inpatient and NPO status past midnight has been confirmed.   PREPARATION FOR PROCEDURE:  The patient was brought to the operating room and placed on the operating table in supine position.  A pneumatic ankle tourniquet was placed about the patient's Left foot but not yet inflated.  After the department of anesthesia had administered MAC anesthesia. A local block was administered and the Left foot was then scrubbed, prepped, and draped in the usual aseptic manner.  An Esmarch bandage was utilized to exsanguinate the patient's Left foot and leg, and the pneumatic tourniquet was inflated to 250 mmHg.  PROCEDURE IN DETAIL:  Attention was then directed to the dorsomedial aspect of the patient's Left foot where a racquet-type incision was made, extending distally, looping around the wound into the first and second digital web space. The  incision was made in the routine fashion, single layer down to the level of bone. When doing so, there was no purulence noted at the incision site. The incision was carried deep down to the level of the first metatarsophalangeal joint where the hallux was disarticulated at that level. Once disarticulated, the first metatarsophalangeal joint, the hallux was removed from the operative field and placed on the back stable to be sent for pathologic analysis. The first metatarsal head was inspected and resected at level of surgical neck and  Sesamoids removed. Any remaining devitalizing necrotic or infected tissue was debrided as necessary. The extensor tendon was dissected as proximally as visible and transected at that level. The operative site was then irrigated utilizing normal sterile saline. Also, bleeders were bovied as necessary. Skin closure was then obtained utilizing 2-0 vicryl and 3-0 prolene/Staples in combination of simple interrupted suture fashion.  The right foot was then dressed with betadine overlying the suture sites, 4 x 4 gauze, abd, Kerlix,and ACE.  At this time, the Left pneumatic tourniquet was deflated, and a positive hyperemic response was noted to the lesser digits. The patient tolerated the procedure and anesthesia well.  Upon transfer to the recovery room, the patient's vital signs were stable, and neurovascular status was intact.    Patient to return to medical floor and to continue on IV antibiotics. Likely patient can go home on Sunday. She already has pain medication and antibiotics as Rx by Dr. Sherryle Lis fromThursday.   Landis Martins, DPM

## 2021-07-11 NOTE — Anesthesia Preprocedure Evaluation (Addendum)
Anesthesia Evaluation  Patient identified by MRN, date of birth, ID band Patient awake    Reviewed: Allergy & Precautions, NPO status , Patient's Chart, lab work & pertinent test results  Airway Mallampati: I  TM Distance: >3 FB Neck ROM: Full    Dental  (+) Edentulous Upper, Edentulous Lower   Pulmonary Current Smoker and Patient abstained from smoking.,    breath sounds clear to auscultation       Cardiovascular hypertension,  Rhythm:Regular Rate:Normal     Neuro/Psych PSYCHIATRIC DISORDERS Depression    GI/Hepatic Neg liver ROS, GERD  ,  Endo/Other  negative endocrine ROS  Renal/GU negative Renal ROS     Musculoskeletal  (+) Arthritis , Rheumatoid disorders,    Abdominal Normal abdominal exam  (+)   Peds  Hematology negative hematology ROS (+)   Anesthesia Other Findings   Reproductive/Obstetrics                           Anesthesia Physical Anesthesia Plan  ASA: 2  Anesthesia Plan: MAC   Post-op Pain Management:    Induction: Intravenous  PONV Risk Score and Plan: 2 and Propofol infusion, Midazolam and Ondansetron  Airway Management Planned: Natural Airway and Simple Face Mask  Additional Equipment: None  Intra-op Plan:   Post-operative Plan:   Informed Consent: I have reviewed the patients History and Physical, chart, labs and discussed the procedure including the risks, benefits and alternatives for the proposed anesthesia with the patient or authorized representative who has indicated his/her understanding and acceptance.       Plan Discussed with: CRNA  Anesthesia Plan Comments:       Anesthesia Quick Evaluation

## 2021-07-11 NOTE — Anesthesia Procedure Notes (Signed)
Procedure Name: MAC Date/Time: 07/11/2021 1:11 PM Performed by: Carolan Clines, CRNA Pre-anesthesia Checklist: Patient identified, Emergency Drugs available, Patient being monitored and Suction available Patient Re-evaluated:Patient Re-evaluated prior to induction Oxygen Delivery Method: Simple face mask Dental Injury: Teeth and Oropharynx as per pre-operative assessment

## 2021-07-11 NOTE — Progress Notes (Signed)
   Subjective: Interviewed at bedside this morning prior to surgery. Patient not currently in pain, recently received her pain meds before we arrived. She says she has never been treated for her lab abnormalities on CBC, but has known about it for a few months and it was being followed.  Objective: Vital signs in last 24 hours: Vitals:   07/10/21 2224 07/11/21 0050 07/11/21 0409 07/11/21 0902  BP: (!) 94/57 111/67 115/73 (!) 100/55  Pulse: (!) 56 78 (!) 57 (!) 52  Resp: $Remo'18 18 16 16  'tqESS$ Temp: 98 F (36.7 C) 98.6 F (37 C) 98 F (36.7 C) 98 F (36.7 C)  TempSrc: Axillary Oral Oral Oral  SpO2: 96% 95% 99% 96%  Weight:      Height:       Intake/Output Summary (Last 24 hours) at 07/11/2021 0943 Last data filed at 07/11/2021 7858 Gross per 24 hour  Intake 400.21 ml  Output 400 ml  Net 0.21 ml   Physical Exam Vitals and nursing note reviewed.  Constitutional:      Appearance: She is not ill-appearing.  Cardiovascular:     Rate and Rhythm: Normal rate.  Pulmonary:     Effort: Pulmonary effort is normal.  Abdominal:     General: There is no distension.     Palpations: Abdomen is soft.     Tenderness: There is no abdominal tenderness. There is no guarding or rebound.  Feet:     Comments: Left great toe bandaged, site of lesion; left 5th digit with lateral lesion; Right 5th digit amputation Skin:    General: Skin is warm and dry.  Neurological:     Mental Status: She is alert.   Assessment/Plan:  Principal Problem:   Osteomyelitis ([place]) Active Problems:   Rheumatoid arthritis ([place])   Alcohol use disorder   Tobacco abuse   Pancytopenia ([facility])   Hepatic steatosis  Acute osteomyelitis Seen by podiatry yesterday and scheduled for surgery today. Pain being managed appropriately at this time with current regimen. ESR 73 and CRP 4.1, both elevated. Blood cultures still pending. Received IV vanc and cefepime yesterday, continuing at this time.   Pancytopenia Noted on labs from  June 2022, in addition to this admission. Patient denies receiving any treatment for this. PT, APTT, and INR are globally elevated at 15.9, 30, and 1.3 respectively. Vit B12 635, ferritin 265, retic count absolute 86.3, are all WNL. 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 Normal retic count makes hypoproliferation less likely. - Abdominal US ordered to evaluate for splenomegaly.     LOS: 1 day   Stevan Born, Medical Student 07/11/2021, 9:43 AM

## 2021-07-12 DIAGNOSIS — M86172 Other acute osteomyelitis, left ankle and foot: Principal | ICD-10-CM

## 2021-07-12 DIAGNOSIS — Z9889 Other specified postprocedural states: Secondary | ICD-10-CM

## 2021-07-12 LAB — CBC
HCT: 30.2 % — ABNORMAL LOW (ref 36.0–46.0)
Hemoglobin: 10.3 g/dL — ABNORMAL LOW (ref 12.0–15.0)
MCH: 33.8 pg (ref 26.0–34.0)
MCHC: 34.1 g/dL (ref 30.0–36.0)
MCV: 99 fL (ref 80.0–100.0)
Platelets: 63 10*3/uL — ABNORMAL LOW (ref 150–400)
RBC: 3.05 MIL/uL — ABNORMAL LOW (ref 3.87–5.11)
RDW: 13.4 % (ref 11.5–15.5)
WBC: 2.5 10*3/uL — ABNORMAL LOW (ref 4.0–10.5)
nRBC: 0 % (ref 0.0–0.2)

## 2021-07-12 LAB — WOUND CULTURE
MICRO NUMBER:: 12347609
SPECIMEN QUALITY:: ADEQUATE

## 2021-07-12 LAB — BASIC METABOLIC PANEL WITH GFR
Anion gap: 5 (ref 5–15)
BUN: 11 mg/dL (ref 8–23)
CO2: 23 mmol/L (ref 22–32)
Calcium: 8.7 mg/dL — ABNORMAL LOW (ref 8.9–10.3)
Chloride: 109 mmol/L (ref 98–111)
Creatinine, Ser: 0.78 mg/dL (ref 0.44–1.00)
GFR, Estimated: >60 mL/min
Glucose, Bld: 82 mg/dL (ref 70–99)
Potassium: 4.4 mmol/L (ref 3.5–5.1)
Sodium: 137 mmol/L (ref 135–145)

## 2021-07-12 LAB — SAVE SMEAR(SSMR), FOR PROVIDER SLIDE REVIEW

## 2021-07-12 LAB — RETICULOCYTES
Immature Retic Fract: 11 % (ref 2.3–15.9)
RBC.: 3.06 MIL/uL — ABNORMAL LOW (ref 3.87–5.11)
Retic Count, Absolute: 73.4 10*3/uL (ref 19.0–186.0)
Retic Ct Pct: 2.4 % (ref 0.4–3.1)

## 2021-07-12 MED ORDER — NAPROXEN 500 MG PO TABS: 500.0000 mg | ORAL_TABLET | Freq: Two times a day (BID) | ORAL | 0 refills | Status: AC

## 2021-07-12 MED ORDER — THIAMINE HCL 100 MG PO TABS: 100.0000 mg | ORAL_TABLET | Freq: Every day | ORAL | 0 refills | Status: DC

## 2021-07-12 MED ORDER — FOLIC ACID 1 MG PO TABS: 1.0000 mg | ORAL_TABLET | Freq: Every day | ORAL | 0 refills | Status: AC

## 2021-07-12 NOTE — Progress Notes (Signed)
Subjective: Erika [name] is a 65 y.o. female patient seen at bedside, resting comfortably in no acute distress s/p day #1 Left 1st toe amputation. Patient admits some pain at surgical site, denies calf pain, denies headache, chest pain, shortness of breath, nausea, vomitting, denies loss of appetite, denies problems with voiding. No other issues noted.   Patient Active Problem List   Diagnosis Date Noted   Osteomyelitis ([facility]) 07/10/2021   Pancytopenia ([facility]) 07/10/2021   Hepatic steatosis 07/10/2021   Osteomyelitis of fifth toe of right foot ([facility]) 04/23/2021   Alcohol use disorder 04/23/2021   Hepatomegaly 04/23/2021   Thrombocytopenia ([facility]) 04/23/2021   GERD (gastroesophageal reflux disease) 04/23/2021   Tobacco abuse 04/23/2021   History of shingles 10/18/2018   Rheumatoid arthritis ([facility]) 10/18/2018   Rash and nonspecific skin eruption 10/18/2018   Numbness and tingling 10/18/2018   Depression 10/18/2018     Current Facility-Administered Medications:    acetaminophen (TYLENOL) tablet 650 mg, 650 mg, Oral, Q6H PRN **OR** acetaminophen (TYLENOL) suppository 650 mg, 650 mg, Rectal, Q6H PRN, Dellia Cloud, MD   ceFEPIme (MAXIPIME) 2 g in sodium chloride 0.9 % 100 mL IVPB, 2 g, Intravenous, Q12H, Daylene Posey, RPH, Last Rate: 200 mL/hr at 07/11/21 2101, 2 g at 07/11/21 2101   folic acid (FOLVITE) tablet 1 mg, 1 mg, Oral, Daily, Dellia Cloud, MD, 1 mg at 07/11/21 1714   lamoTRIgine (LAMICTAL) tablet 50 mg, 50 mg, Oral, BID, Dellia Cloud, MD, 50 mg at 07/11/21 2055   multivitamin with minerals tablet 1 tablet, 1 tablet, Oral, Daily, Dellia Cloud, MD, 1 tablet at 07/11/21 1713   ondansetron (ZOFRAN) tablet 4 mg, 4 mg, Oral, Q6H PRN **OR** ondansetron (ZOFRAN) injection 4 mg, 4 mg, Intravenous, Q6H PRN, Dellia Cloud, MD   oxyCODONE-acetaminophen (PERCOCET/ROXICET) 5-325 MG per tablet 1 tablet, 1 tablet, Oral, Q8H PRN, 1 tablet at 07/12/21 0210 **AND** oxyCODONE (Oxy IR/ROXICODONE)  immediate release tablet 5 mg, 5 mg, Oral, Q8H PRN, Cathie Hoops, RPH, 5 mg at 07/11/21 3220   polyethylene glycol (MIRALAX / GLYCOLAX) packet 17 g, 17 g, Oral, Daily PRN, Dellia Cloud, MD   pregabalin (LYRICA) capsule 300 mg, 300 mg, Oral, BID, Dellia Cloud, MD, 300 mg at 07/11/21 2055   thiamine tablet 100 mg, 100 mg, Oral, Daily, 100 mg at 07/11/21 1713 **OR** thiamine (B-1) injection 100 mg, 100 mg, Intravenous, Daily, Dellia Cloud, MD, 100 mg at 07/10/21 1814   traZODone (DESYREL) tablet 100 mg, 100 mg, Oral, QHS, Dellia Cloud, MD, 100 mg at 07/11/21 2055   vancomycin (VANCOCIN) IVPB 1000 mg/200 mL premix, 1,000 mg, Intravenous, Q24H, Daylene Posey, RPH, Last Rate: 200 mL/hr at 07/11/21 1716, 1,000 mg at 07/11/21 1716   venlafaxine XR (EFFEXOR-XR) 24 hr capsule 150 mg, 150 mg, Oral, Q breakfast, Dellia Cloud, MD  Facility-Administered Medications Ordered in Other Encounters:    ePHEDrine sulfate (PF) 5mg /mL syringe, , Intravenous, Anesthesia Intra-op, , CRNA, 5 mg at 07/11/21 1327   fentaNYL citrate (PF) (SUBLIMAZE) injection, , Intravenous, Anesthesia Intra-op, 09/10/21, CRNA, 50 mcg at 07/11/21 1315   midazolam (VERSED) 5 MG/5ML injection, , Intravenous, Anesthesia Intra-op, 09/10/21, CRNA, 2 mg at 07/11/21 1304   ondansetron (ZOFRAN) injection, , Intravenous, Anesthesia Intra-op, 09/10/21, CRNA, 4 mg at 07/11/21 1331   phenylephrine (NEO-SYNEPHRINE) 20mg /NS 09/10/21 premix infusion, , Intravenous, Continuous PRN, , CRNA, Stopped at 07/11/21 1346   PHENYLephrine 40 mcg/ml in normal saline Adult IV Push Syringe (  For Blood Pressure Support), , Intravenous, Anesthesia Intra-op, Garfield Cornea, CRNA, 80 mcg at 07/11/21 1329   propofol (DIPRIVAN) 500 MG/50ML infusion, , Intravenous, Continuous PRN, Garfield Cornea, CRNA, Stopped at 07/11/21 1347  No Known Allergies   Objective: Today's Vitals    07/11/21 2101 07/12/21 0200 07/12/21 0255 07/12/21 0538  BP:    (!) 127/113  Pulse:    (!) 59  Resp:    17  Temp:    98 F (36.7 C)  TempSrc:    Oral  SpO2:    99%  Weight:      Height:      PainSc: 2  10-Worst pain ever Asleep     General: No acute distress  Left Lower extremity: Dressing to left foot clean, dry, intact. No strikethrough noted, Upon removal of dressings sutures and staples intact with no dehiscence. Decreased erythema, decreased edema, mild blood drainage in inner gauze layers. No other acute signs of infection. No calf pain. Range of motion excluding surgical site within normal limits with no pain or crepitation.        Post Op Xray, Left foot: IMPRESSION: 1. Interval amputation at the level of the first left mid metatarsal bone. 2. Soft tissue swelling and emphysema, likely postsurgical.    Assessment and Plan:  Problem List Items Addressed This Visit       Musculoskeletal and Integument   * (Principal) Osteomyelitis ([facility])   Relevant Medications   ceFEPIme (MAXIPIME) 2 g in sodium chloride 0.9 % 100 mL IVPB   vancomycin (VANCOCIN) IVPB 1000 mg/200 mL premix     Hematopoietic and Hemostatic   Pancytopenia ([facility])   Relevant Medications   folic acid (FOLVITE) tablet 1 mg   Other Relevant Orders   US Abdomen Complete (Completed)   Other Visit Diagnoses     S/P foot surgery, left       Relevant Orders   DG Foot 2 Views Left (Completed)       -Patient seen and evaluated at bedside -Chart revieweed -Dressing change performed -Advised patient to make sure to keep dressing clean, dry, and intact to Left foot -Weightbearing to heel with post op shoe for bedside and bathroom only with assistive device as needed -Continue with rest and elevation to assist with pain and edema control  -Podiatry will continue to follow closely outpatient;Patient stable to be discharged today -Anticipated discharge plan of care: To home today. Patient already has PO  antibiotics and pain meds that was Rx by Dr. Lilian Kapur on Thursday that she will resume taking. Office will contact patient to schedule post op appointment for next week with Dr. Lilian Kapur   Dr. Asencion Islam [facility] [phone] office [phone] cell Available via secure chat

## 2021-07-12 NOTE — Progress Notes (Signed)
Erika [name] to be discharged Home per MD order. Discussed prescriptions and follow up appointments with the patient. Prescriptions and  medication list explained in detail. Patient verbalized understanding. Home medications returned to the patient.   Skin clean, dry and intact without evidence of skin break down, no evidence of skin tears noted. IV catheter discontinued intact. Site without signs and symptoms of complications. Dressing and pressure applied. Pt denies pain at the site currently. No complaints noted.  Patient free of lines, drains, and wounds.   An After Visit Summary (AVS) was printed and given to the patient. Patient escorted via wheelchair, and discharged home via private auto.  Arvilla Meres, RN

## 2021-07-12 NOTE — Discharge Summary (Addendum)
Name: Erika [name] MRN: [id number] DOB: 06-13-1956 65 y.o. PCP: Erika Lor, NP  Date of Admission: 07/10/2021  8:53 AM Date of Discharge: 07/12/2020 Attending Physician: Dr. Carlynn Purl  Subjective: Patient evaluated at bedside this AM. Patient states her pain has been controlled with Percocet, although it does not last the full eight hours. Otherwise discussed plans for discharge.  Discharge Diagnosis: 1. Osteomyelitis of L great toe 2. Pancytopenia 3. Tobacco use disorder  Discharge Medications: Allergies as of 07/12/2021   No Known Allergies      Medication List     STOP taking these medications    oxyCODONE-acetaminophen 10-325 MG tablet Commonly known as: Percocet       TAKE these medications    doxycycline 100 MG tablet Commonly known as: VIBRA-TABS Take 1 tablet (100 mg total) by mouth 2 (two) times daily for 5 days.   folic acid 1 MG tablet Commonly known as: FOLVITE Take 1 tablet (1 mg total) by mouth daily. Start taking on: July 13, 2021   lamoTRIgine 100 MG tablet Commonly known as: LAMICTAL Take 50 mg by mouth 2 (two) times daily.   levofloxacin 750 MG tablet Commonly known as: LEVAQUIN Take 1 tablet (750 mg total) by mouth daily for 5 days.   mupirocin ointment 2 % Commonly known as: BACTROBAN Apply 1 application topically 2 (two) times daily.   naproxen 500 MG tablet Commonly known as: Naprosyn Take 1 tablet (500 mg total) by mouth 2 (two) times daily with a meal for 7 days.   pregabalin 300 MG capsule Commonly known as: LYRICA Take 300 mg by mouth 2 (two) times daily.   thiamine 100 MG tablet Take 1 tablet (100 mg total) by mouth daily. Start taking on: July 13, 2021   traZODone 100 MG tablet Commonly known as: DESYREL Take 100 mg by mouth at bedtime.   venlafaxine XR 150 MG 24 hr capsule Commonly known as: EFFEXOR-XR Take 150 mg by mouth daily with breakfast.               Discharge Care Instructions   (From admission, onward)           Start     Ordered   07/12/21 0000  Discharge wound care:       Comments: Keep dressing clean and dry   07/12/21 1118            Disposition and follow-up:   Ms.Erika [name] was discharged from [facility] in Stable condition.  At the hospital follow up visit please address:  1.  Osteomyelitis of L great toe: Patient to follow-up with Dr. Lilian Kapur in one week. Has previous RX for Percocet, encouraged use of naproxen as well for pain control.  2.  Pancytopenia: Unclear etiology, possibly 2/2 liver disease vs infiltration 2/2 infection. No splenomegaly on ultrasound. Smear sent to pathologist, pending.  3. Tobacco use disorder: Continue discussions regarding cessation  4.  Labs / imaging needed at time of follow-up: CBC  5.  Pending labs/ test needing follow-up: Peripheral blood smear  Follow-up Appointments:   Follow-up Information     Erika Lor, NP Follow up in 1 week(s).   Specialty: Nurse Practitioner Contact information: [address] [place] [address] [phone]         Erika [name], DPM Follow up in 1 week(s).   Specialty: Actuary information: [address] [place] [address] [phone]  Hospital Course by problem list: 1. Osteomyelitis of L great toe: Patient arrived to the ED after seeing her podiatrist the day prior for possible toe infection. She previously had right 5th digit amputated a few weeks ago. Unclear etiology of the wounds, no history of T2DM, PAD (normal ABI's), IV drug use, or trauma. She does endorse tobacco use. Patient arrived hemodynamically stable without systemic signs of infection. Inflammatory markers were elevated and imaging showed bony destruction at great toe IP joint. Podiatry was subsequently consulted by the ED an IMTS was consulted for admission. Patient was initiated on broad-spectrum antibiotics. She then  underwent amputation of L great toe on 07/11/2021. POD1 patient continued to be stable with pain controlled. She was deemed stable for discharge and will follow-up with Dr. Lilian Kapur in one week.  2. Pancytopenia: Lab work upon arrival revealed pancytopenia. Patient is not on immunosuppressive medications and has no known immunosuppressive disease. She does report a history of daily alcohol use. Liver function tests normal.  Abdominal ultrasound revealed likely cirrhosis of liver without splenomegaly. Likely pancytopenia 2/2 cirrhosis vs bone infiltration due to inflammation. Peripheral blood smear was obtained, pending pathologist read.  3. Tobacco use disorder: Patient has no other known risk factor for distal wounds. Will need continued counseling for cessation.  Discharge Exam:   BP (!) 100/48 (BP Location: Left Arm)   Pulse (!) 58   Temp 98.3 F (36.8 C)   Resp 17   Ht 5' (1.524 m)   Wt 70.3 kg   SpO2 97%   BMI 30.27 kg/m  Discharge exam:  General: Resting comfortably in bed, no acute distress Pulm: Normal work of breathing on room air. Extremities: Warm, no pitting edema. L foot wrapped. Neuro: Awake, alert, conversing appropriately Psych: Normal mood, speech, affect  Pertinent Labs, Studies, and Procedures:  CBC Latest Ref Rng & Units 07/12/2021 07/11/2021 07/10/2021  WBC 4.0 - 10.5 K/uL 2.5(L) 2.7(L) 2.5(L)  Hemoglobin 12.0 - 15.0 g/dL 10.3(L) 10.9(L) 10.1(L)  Hematocrit 36.0 - 46.0 % 30.2(L) 32.6(L) 31.0(L)  Platelets 150 - 400 K/uL 63(L) 67(L) 63(L)   BMP Latest Ref Rng & Units 07/12/2021 07/11/2021 07/10/2021  Glucose 70 - 99 mg/dL 82 010(U) 78  BUN 8 - 23 mg/dL 11 12 11   Creatinine 0.44 - 1.00 mg/dL 7.25 3.66  Sodium 135 - 145 mmol/L 137 137 135  Potassium 3.5 - 5.1 mmol/L 4.4 4.5 5.0  Chloride 98 - 111 mmol/L 109 110 109  CO2 22 - 32 mmol/L 23 22 22   Calcium 8.9 - 10.3 mg/dL 4.40) ) 3.4(V)   XR L FOOT 07/10/2021 There is bony destruction at the great toe  interphalangeal joint with extensive fragmentation mainly of the proximal phalanx. There is adjacent soft tissue swelling. Moderate first MTP degenerative arthritis. Unchanged splaying of the second and third toes and overlapping fourth and fifth toes. Large plantar calcaneal spur. Forefoot soft tissue swelling.  AMPUTATION OF L GREAT TOE 07/11/2021  XR L FOOT 07/11/2021 Interval amputation at the level of the first mid metatarsal bone. Soft tissue swelling and emphysema. Skin staples in place. No evidence of acute fracture or dystrophic changes of the osseous structures.  ABDOMINAL ULTRASOUND 07/11/2021 Findings of cirrhosis and portal hypertension. Vague area of decreased echogenicity in the RIGHT hemiliver may represent artifact, given findings above would however suggest liver MRI or multiphase CT for further evaluation. Small polyp in the dependent gallbladder at 6 mm. RIGHT-sided nephrolithiasis.  Discharge Instructions: Discharge Instructions     Call  MD for:  difficulty breathing, headache or visual disturbances   Complete by: As directed    Call MD for:  extreme fatigue   Complete by: As directed    Call MD for:  hives   Complete by: As directed    Call MD for:  persistant dizziness or light-headedness   Complete by: As directed    Call MD for:  persistant nausea and vomiting   Complete by: As directed    Call MD for:  redness, tenderness, or signs of infection (pain, swelling, redness, odor or green/yellow discharge around incision site)   Complete by: As directed    Call MD for:  severe uncontrolled pain   Complete by: As directed    Call MD for:  temperature >100.4   Complete by: As directed    Diet - low sodium heart healthy   Complete by: As directed    Discharge instructions   Complete by: As directed    Ms. Diona Browner, I know you are glad to be discharging from the hospital today! You were admitted with an infection of your toe, which required amputation. You will need to  follow-up with Dr. Lilian Kapur in 1-2 weeks. Please see the following notes:  - Please continue taking the Percocet that Dr. Lilian Kapur gave you. You can take 1 tablet every 8hours as needed. I have also prescribed anti-inflammatory medication (naproxen 500mg  twice daily) that you can take for the next few days along with the Percocet.  - No other medication changes at this time.   - The cells in your blood are low. We believe this could be due to alcohol use or possibly your bone infection. We recommend decreasing your alcohol intake as well as following up with your primary care doctor for this.  It was a pleasure meeting you, Ms. Camuso. I wish you the best and hope you stay happy and healthy!  Thank you, , MD   Discharge wound care:   Complete by: As directed    Keep dressing clean and dry   Increase activity slowly   Complete by: As directed       Signed: Evlyn Kanner, MD 07/12/2021, 11:18 AM   Pager: [phone]

## 2021-07-13 ENCOUNTER — Telehealth: Payer: Self-pay | Admitting: *Deleted

## 2021-07-13 ENCOUNTER — Encounter (HOSPITAL_COMMUNITY): Payer: Self-pay | Admitting: Sports Medicine

## 2021-07-13 LAB — GLUCOSE, CAPILLARY: Glucose-Capillary: 102 mg/dL — ABNORMAL HIGH (ref 70–99)

## 2021-07-13 NOTE — Transfer of Care (Signed)
Immediate Anesthesia Transfer of Care Note  Patient: Erika [name]  Procedure(s) Performed: AMPUTATION TOE (Left: Toe)  Patient Location: PACU  Anesthesia Type:MAC  Level of Consciousness: awake, alert  and oriented  Airway & Oxygen Therapy: Patient Spontanous Breathing  Post-op Assessment: Report given to RN, Post -op Vital signs reviewed and stable and Patient moving all extremities  Post vital signs: Reviewed and stable  Last Vitals:  Vitals Value Taken Time  BP 100/48 07/12/21 0958  Temp 36.8 C 07/12/21 0958  Pulse 58 07/12/21 0958  Resp 17 07/12/21 0958  SpO2 97 % 07/12/21 0958    Last Pain:  Vitals:   07/12/21 1126  TempSrc:   PainSc: Asleep      Patients Stated Pain Goal: 2 (36/68/15 9470)  Complications: No notable events documented.

## 2021-07-13 NOTE — Telephone Encounter (Signed)
Patient is calling to request pain medicine refill , only has 3 pills remaining.(oxycodone-ace, 10-325 mg),recently had surgery.Please advise.

## 2021-07-14 LAB — SURGICAL PATHOLOGY

## 2021-07-14 LAB — PATHOLOGIST SMEAR REVIEW

## 2021-07-15 ENCOUNTER — Other Ambulatory Visit: Payer: Self-pay

## 2021-07-15 ENCOUNTER — Ambulatory Visit (INDEPENDENT_AMBULATORY_CARE_PROVIDER_SITE_OTHER): Payer: Medicare (Managed Care) | Admitting: Podiatry

## 2021-07-15 ENCOUNTER — Encounter: Payer: Self-pay | Admitting: Podiatry

## 2021-07-15 DIAGNOSIS — T148XXA Other injury of unspecified body region, initial encounter: Secondary | ICD-10-CM

## 2021-07-15 DIAGNOSIS — Z89412 Acquired absence of left great toe: Secondary | ICD-10-CM | POA: Diagnosis not present

## 2021-07-15 LAB — CULTURE, BLOOD (ROUTINE X 2)
Culture: NO GROWTH
Culture: NO GROWTH

## 2021-07-15 MED ORDER — DOXYCYCLINE HYCLATE 100 MG PO TABS: 100.0000 mg | ORAL_TABLET | Freq: Two times a day (BID) | ORAL | 0 refills | Status: DC

## 2021-07-15 MED ORDER — CIPROFLOXACIN HCL 500 MG PO TABS: 500.0000 mg | ORAL_TABLET | Freq: Two times a day (BID) | ORAL | 0 refills | Status: AC

## 2021-07-15 MED ORDER — OXYCODONE-ACETAMINOPHEN 5-325 MG PO TABS: 1.0000 | ORAL_TABLET | ORAL | 0 refills | Status: AC | PRN

## 2021-07-15 NOTE — Progress Notes (Signed)
Subjective:  Patient ID: Erika [name], female    DOB: 1955-11-20,  MRN: [id number]  Chief Complaint  Patient presents with   Routine Post Op    DOS 9.10.22    DOS: 07/11/2021 Procedure: Status post left great toe amputation  65 y.o. female returns for post-op check.  Patient presents with complaint of redness and excruciating pain to the left great toe incision site.  She states the redness significant for last few days she has been taking her antibiotics which she has now completed the course.  She wanted to get it evaluated make sure that there is no worsening infection.  She denies any other acute complaints.  She would also like to do pain medication for the pain.  Review of Systems: Negative except as noted in the HPI. Denies N/V/F/Ch.  Past Medical History:  Diagnosis Date   Arthritis    Depression    GERD (gastroesophageal reflux disease)    History of shingles 10/18/2018   Hypertension    Rheumatoid arthritis(714.0)     Current Outpatient Medications:    ciprofloxacin (CIPRO) 500 MG tablet, Take 1 tablet (500 mg total) by mouth 2 (two) times daily for 10 days., Disp: 20 tablet, Rfl: 0   doxycycline (VIBRA-TABS) 100 MG tablet, Take 1 tablet (100 mg total) by mouth 2 (two) times daily., Disp: 20 tablet, Rfl: 0   oxyCODONE-acetaminophen (PERCOCET/ROXICET) 5-325 MG tablet, Take 1 tablet by mouth every 4 (four) hours as needed for up to 5 days for severe pain., Disp: 30 tablet, Rfl: 0   folic acid (FOLVITE) 1 MG tablet, Take 1 tablet (1 mg total) by mouth daily., Disp: 30 tablet, Rfl: 0   lamoTRIgine (LAMICTAL) 100 MG tablet, Take 50 mg by mouth 2 (two) times daily., Disp: , Rfl:    mupirocin ointment (BACTROBAN) 2 %, Apply 1 application topically 2 (two) times daily., Disp: 22 g, Rfl: 0   naproxen (NAPROSYN) 500 MG tablet, Take 1 tablet (500 mg total) by mouth 2 (two) times daily with a meal for 7 days., Disp: 14 tablet, Rfl: 0   pregabalin (LYRICA) 300 MG capsule, Take 300  mg by mouth 2 (two) times daily., Disp: , Rfl:    thiamine 100 MG tablet, Take 1 tablet (100 mg total) by mouth daily., Disp: 30 tablet, Rfl: 0   traZODone (DESYREL) 100 MG tablet, Take 100 mg by mouth at bedtime., Disp: , Rfl:    venlafaxine XR (EFFEXOR-XR) 150 MG 24 hr capsule, Take 150 mg by mouth daily with breakfast., Disp: , Rfl:   Social History   Tobacco Use  Smoking Status Every Day   Packs/day: 0.50   Types: Cigarettes  Smokeless Tobacco Current    No Known Allergies Objective:  There were no vitals filed for this visit. There is no height or weight on file to calculate BMI. Constitutional Well developed. Well nourished.  Vascular Foot warm and well perfused. Capillary refill normal to all digits.   Neurologic Normal speech. Oriented to person, place, and time. Epicritic sensation to light touch grossly present bilaterally.  Dermatologic Skin healing well without signs of infection. Skin edges well coapted without signs of infection.  Small hematoma clinically appreciated.  Orthopedic: Tenderness to palpation noted about the surgical site.   Radiographs: None Assessment:   1. Hematoma   2. Status post amputation of left great toe [facility])    Plan:  Patient was evaluated and treated and all questions answered.  S/p foot surgery left with concern  for possible hematoma with underlying dead space. -Progressing as expected post-operatively. -XR: See above -WB Status: Weightbearing as tolerated in surgical shoe -Sutures: Intact.  No clinical signs of dehiscence or complication noted -Medications: Percocet doxycycline and ciprofloxacin -Foot redressed. -I discussed with the patient that she may have likely developed a hematoma that could have gotten inflamed/infected.  I will place her on antibiotics.  At this time the skin incision site looks good.  I discussed with her that if the hematoma does not self resolve patient may need a decompression of hematoma via removal of  staples and stitches.  Patient agrees with the plan.  She will be scheduled to see Dr. Lilian Kapur for further evaluation and management.  Return in about 1 week (around 07/22/2021) for [place] .

## 2021-07-18 NOTE — ED Provider Notes (Signed)
[facility] Provider Note   CSN: [id number] Arrival date & time: 07/10/21  9381     History Chief Complaint  Patient presents with   Wound Infection    Erika [name] is a 65 y.o. female.  Patient sent in by her podiatry team for evaluation of the left first toe infection.  Apparently not doing well on an outpatient basis and sent to the ER for possible amputation.  Patient complaining of persistent pain there.  Otherwise denies fevers or cough or vomiting or diarrhea.      Past Medical History:  Diagnosis Date   Arthritis    Depression    GERD (gastroesophageal reflux disease)    History of shingles 10/18/2018   Hypertension    Rheumatoid arthritis(714.0)     Patient Active Problem List   Diagnosis Date Noted   S/P foot surgery, left    Osteomyelitis (HCC) 07/10/2021   Pancytopenia (HCC) 07/10/2021   Hepatic steatosis 07/10/2021   Osteomyelitis of fifth toe of right foot (HCC) 04/23/2021   Alcohol use disorder 04/23/2021   Hepatomegaly 04/23/2021   Thrombocytopenia (HCC) 04/23/2021   GERD (gastroesophageal reflux disease) 04/23/2021   Tobacco abuse 04/23/2021   History of shingles 10/18/2018   Rheumatoid arthritis (HCC) 10/18/2018   Rash and nonspecific skin eruption 10/18/2018   Numbness and tingling 10/18/2018   Depression 10/18/2018    Past Surgical History:  Procedure Laterality Date   AMPUTATION TOE Right 04/24/2021   Procedure: AMPUTATION TOE;  Surgeon: Edwin Cap, DPM;  Location: [facility] OR;  Service: Podiatry;  Laterality: Right;   AMPUTATION TOE Left 07/11/2021   Procedure: AMPUTATION TOE;  Surgeon: Asencion Islam, DPM;  Location: [facility] OR;  Service: Podiatry;  Laterality: Left;   BREAST REDUCTION SURGERY     REDUCTION MAMMAPLASTY Bilateral      OB History     Gravida  2   Para  2   Term  2   Preterm      AB      Living  2      SAB      IAB      Ectopic      Multiple      Live Births               Family History  Problem Relation Age of Onset   Psoriasis Sister     Social History   Tobacco Use   Smoking status: Every Day    Packs/day: 0.50    Types: Cigarettes   Smokeless tobacco: Current  Vaping Use   Vaping Use: Never used  Substance Use Topics   Alcohol use: Yes    Alcohol/week: 5.0 standard drinks    Types: 5 Cans of beer per week    Comment: sometimes - none in a week   Drug use: Yes    Types: Marijuana    Comment: used last week 07/04/21    Home Medications Prior to Admission medications   Medication Sig Start Date End Date Taking? Authorizing Provider  lamoTRIgine (LAMICTAL) 100 MG tablet Take 50 mg by mouth 2 (two) times daily.   Yes [provider]  mupirocin ointment (BACTROBAN) 2 % Apply 1 application topically 2 (two) times daily. 06/25/21  Yes McDonald, Rachelle Hora, DPM  naproxen (NAPROSYN) 500 MG tablet Take 1 tablet (500 mg total) by mouth 2 (two) times daily with a meal for 7 days. 07/12/21 07/19/21 Yes Evlyn Kanner, MD  pregabalin (LYRICA) 300  MG capsule Take 300 mg by mouth 2 (two) times daily. 06/02/21  Yes [provider]  traZODone (DESYREL) 100 MG tablet Take 100 mg by mouth at bedtime.   Yes [provider]  venlafaxine XR (EFFEXOR-XR) 150 MG 24 hr capsule Take 150 mg by mouth daily with breakfast.   Yes [provider]  ciprofloxacin (CIPRO) 500 MG tablet Take 1 tablet (500 mg total) by mouth 2 (two) times daily for 10 days. 07/15/21 07/25/21  Candelaria Stagers, DPM  doxycycline (VIBRA-TABS) 100 MG tablet Take 1 tablet (100 mg total) by mouth 2 (two) times daily. 07/15/21   Candelaria Stagers, DPM  folic acid (FOLVITE) 1 MG tablet Take 1 tablet (1 mg total) by mouth daily. 07/13/21 08/12/21  Evlyn Kanner, MD  oxyCODONE-acetaminophen (PERCOCET/ROXICET) 5-325 MG tablet Take 1 tablet by mouth every 4 (four) hours as needed for up to 5 days for severe pain. 07/15/21 07/20/21  Candelaria Stagers, DPM  thiamine 100 MG tablet Take 1  tablet (100 mg total) by mouth daily. 07/13/21   Evlyn Kanner, MD    Allergies    Patient has no known allergies.  Review of Systems   Review of Systems  Constitutional:  Negative for fever.  HENT:  Negative for ear pain.   Eyes:  Negative for pain.  Respiratory:  Negative for cough.   Cardiovascular:  Negative for chest pain.  Gastrointestinal:  Negative for abdominal pain.  Genitourinary:  Negative for flank pain.  Musculoskeletal:  Negative for back pain.  Skin:  Negative for rash.  Neurological:  Negative for headaches.   Physical Exam Updated Vital Signs BP (!) 100/48 (BP Location: Left Arm)   Pulse (!) 58   Temp 98.3 F (36.8 C)   Resp 17   Ht 5' (1.524 m)   Wt 70.3 kg   SpO2 97%   BMI 30.27 kg/m   Physical Exam Constitutional:      General: She is not in acute distress.    Appearance: Normal appearance.  HENT:     Head: Normocephalic.     Nose: Nose normal.  Eyes:     Extraocular Movements: Extraocular movements intact.  Cardiovascular:     Rate and Rhythm: Normal rate.  Pulmonary:     Effort: Pulmonary effort is normal.  Musculoskeletal:     Cervical back: Normal range of motion.  Skin:    Comments: Ulcerative lesion to the left first toe plantar surface.  No purulent discharge noted.  Neurological:     General: No focal deficit present.     Mental Status: She is alert. Mental status is at baseline.    ED Results / Procedures / Treatments   Labs (all labs ordered are listed, but only abnormal results are displayed) Labs Reviewed  BASIC METABOLIC PANEL - Abnormal; Notable for the following components:      Result Value   Sodium 134 (*)    Glucose, Bld 173 (*)    Creatinine, Ser 1.01 (*)    Calcium 8.2 (*)    Anion gap 4 (*)    All other components within normal limits  CBC - Abnormal; Notable for the following components:   WBC 3.0 (*)    RBC 3.40 (*)    Hemoglobin 11.2 (*)    HCT 34.1 (*)    MCV 100.3 (*)    Platelets 72 (*)    All  other components within normal limits  COMPREHENSIVE METABOLIC PANEL - Abnormal; Notable for the  following components:   Calcium 8.2 (*)    Total Protein 6.2 (*)    Albumin 2.2 (*)    Anion gap 4 (*)    All other components within normal limits  RETICULOCYTES - Abnormal; Notable for the following components:   RBC. 3.05 (*)    All other components within normal limits  PROTIME-INR - Abnormal; Notable for the following components:   Prothrombin Time 15.9 (*)    INR 1.3 (*)    All other components within normal limits  CBC WITH DIFFERENTIAL/PLATELET - Abnormal; Notable for the following components:   WBC 2.5 (*)    RBC 3.10 (*)    Hemoglobin 10.1 (*)    HCT 31.0 (*)    Platelets 63 (*)    Lymphs Abs 0.6 (*)    All other components within normal limits  SEDIMENTATION RATE - Abnormal; Notable for the following components:   Sed Rate 73 (*)    All other components within normal limits  C-REACTIVE PROTEIN - Abnormal; Notable for the following components:   CRP 4.1 (*)    All other components within normal limits  GLUCOSE, CAPILLARY - Abnormal; Notable for the following components:   Glucose-Capillary 102 (*)    All other components within normal limits  CBC - Abnormal; Notable for the following components:   WBC 2.7 (*)    RBC 3.32 (*)    Hemoglobin 10.9 (*)    HCT 32.6 (*)    Platelets 67 (*)    All other components within normal limits  BASIC METABOLIC PANEL - Abnormal; Notable for the following components:   Glucose, Bld 101 (*)    Calcium 8.5 (*)    All other components within normal limits  CBC - Abnormal; Notable for the following components:   WBC 2.5 (*)    RBC 3.05 (*)    Hemoglobin 10.3 (*)    HCT 30.2 (*)    Platelets 63 (*)    All other components within normal limits  BASIC METABOLIC PANEL - Abnormal; Notable for the following components:   Calcium 8.7 (*)    All other components within normal limits  RETICULOCYTES - Abnormal; Notable for the following  components:   RBC. 3.06 (*)    All other components within normal limits  GLUCOSE, CAPILLARY - Abnormal; Notable for the following components:   Glucose-Capillary 102 (*)    All other components within normal limits  CULTURE, BLOOD (ROUTINE X 2)  CULTURE, BLOOD (ROUTINE X 2)  RESP PANEL BY RT-PCR (FLU A&B, COVID) ARPGX2  SURGICAL PCR SCREEN  PHOSPHORUS  MAGNESIUM  APTT  FERRITIN  VITAMIN B12  SAVE SMEAR (SSMR)  PATHOLOGIST SMEAR REVIEW  SURGICAL PATHOLOGY    EKG None  Radiology No results found.  Procedures Procedures   Medications Ordered in ED Medications  vancomycin (VANCOREADY) IVPB 1500 mg/300 mL (0 mg Intravenous Stopped 07/10/21 1432)  Chlorhexidine Gluconate Cloth 2 % PADS 6 each (6 each Topical Given 07/11/21 0015)  chlorhexidine (PERIDEX) 0.12 % solution 15 mL (15 mLs Mouth/Throat Given 07/11/21 1124)    Or  MEDLINE mouth rinse ( Mouth Rinse See Alternative 07/11/21 1124)    ED Course  I have reviewed the triage vital signs and the nursing notes.  Pertinent labs & imaging results that were available during my care of the patient were reviewed by me and considered in my medical decision making (see chart for details).    MDM Rules/Calculators/A&P  Clinical suspicion for osteomyelitis of the left toe.  Patient started on IV antibiotics, admitted to the hospital.   Final Clinical Impression(s) / ED Diagnoses Final diagnoses:  Pancytopenia (HCC)  Cellulitis of left foot    Rx / DC Orders ED Discharge Orders          Ordered    folic acid (FOLVITE) 1 MG tablet  Daily        07/12/21 1118    thiamine 100 MG tablet  Daily        07/12/21 1118    Increase activity slowly        07/12/21 1118    Diet - low sodium heart healthy        07/12/21 1118    Discharge instructions       Comments: Erika [name], I know you are glad to be discharging from the hospital today! You were admitted with an infection of your toe, which  required amputation. You will need to follow-up with Dr. Lilian Kapur in 1-2 weeks. Please see the following notes:  - Please continue taking the Percocet that Dr. Lilian Kapur gave you. You can take 1 tablet every 8hours as needed. I have also prescribed anti-inflammatory medication (naproxen 500mg  twice daily) that you can take for the next few days along with the Percocet.  - No other medication changes at this time.   - The cells in your blood are low. We believe this could be due to alcohol use or possibly your bone infection. We recommend decreasing your alcohol intake as well as following up with your primary care doctor for this.  It was a pleasure meeting you, Erika [name]. I wish you the best and hope you stay happy and healthy!  Thank you, , MD   07/12/21 1118    Discharge wound care:       Comments: Keep dressing clean and dry   07/12/21 1118    Call MD for:  extreme fatigue        07/12/21 1118    Call MD for:  persistant dizziness or light-headedness        07/12/21 1118    Call MD for:  hives        07/12/21 1118    Call MD for:  difficulty breathing, headache or visual disturbances        07/12/21 1118    Call MD for:  redness, tenderness, or signs of infection (pain, swelling, redness, odor or green/yellow discharge around incision site)        07/12/21 1118    Call MD for:  severe uncontrolled pain        07/12/21 1118    Call MD for:  persistant nausea and vomiting        07/12/21 1118    Call MD for:  temperature >100.4        07/12/21 1118    naproxen (NAPROSYN) 500 MG tablet  2 times daily with meals        07/12/21 1118             09/11/21, MD 07/18/21 1551

## 2021-07-23 ENCOUNTER — Ambulatory Visit (INDEPENDENT_AMBULATORY_CARE_PROVIDER_SITE_OTHER): Payer: Medicare (Managed Care) | Admitting: Podiatry

## 2021-07-23 ENCOUNTER — Other Ambulatory Visit: Payer: Self-pay

## 2021-07-23 DIAGNOSIS — Z89412 Acquired absence of left great toe: Secondary | ICD-10-CM

## 2021-07-23 MED ORDER — OXYCODONE-ACETAMINOPHEN 10-325 MG PO TABS: 1.0000 | ORAL_TABLET | ORAL | 0 refills | Status: AC | PRN

## 2021-07-28 ENCOUNTER — Encounter: Payer: Self-pay | Admitting: Podiatry

## 2021-07-28 NOTE — Progress Notes (Signed)
  Subjective:  Patient ID: Erika [name], female    DOB: Nov 08, 1955,  MRN: [id number]  Chief Complaint  Patient presents with   Routine Post Op       DOS 07/11/2021 POV#2 s/p left great toe amputation -Dr. Sherryle Lis    DOS: 04/24/2021 Procedure: Fillet of fifth toe flap  65 y.o. female returns for post-op check.  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 Cannon Kettle  Review of Systems: Negative except as noted in the HPI. Denies N/V/F/Ch.   Objective:   There were no vitals filed for this visit.  There is no height or weight on file to calculate BMI. Constitutional Well developed. Well nourished.  Vascular Foot warm and well perfused. Capillary refill normal to all digits.   Neurologic Normal speech. Oriented to person, place, and time. Epicritic sensation to light touch grossly present bilaterally.  Dermatologic Sutures and staples intact  Orthopedic: Tenderness to palpation noted about the surgical site.    ESR 25 she has thrombocytopenia at 47 and white blood cell count of 3.2  Noninvasive vascular testing with adequate perfusion Assessment:   No diagnosis found.  Plan:  Patient was evaluated and treated and all questions answered.   Appears to be healing well.  Return in 2 weeks for suture removal and after that we will schedule for amputation filler for the shoe.  I did refill her pain medication at her request  Return in about 2 weeks (around 08/06/2021) for post op (no x-rays), suture removal.

## 2021-08-05 ENCOUNTER — Telehealth: Payer: Self-pay | Admitting: *Deleted

## 2021-08-05 NOTE — Telephone Encounter (Signed)
Patient is calling to request pain medicine refill before her upcoming appointment on 08/10/21. Please advise.

## 2021-08-06 ENCOUNTER — Inpatient Hospital Stay (HOSPITAL_COMMUNITY): Admission: RE | Admit: 2021-08-06 | Payer: Medicare (Managed Care) | Source: Ambulatory Visit

## 2021-08-06 ENCOUNTER — Ambulatory Visit: Payer: Medicare (Managed Care)

## 2021-08-06 MED ORDER — OXYCODONE-ACETAMINOPHEN 10-325 MG PO TABS: 1.0000 | ORAL_TABLET | Freq: Four times a day (QID) | ORAL | 0 refills | Status: DC | PRN

## 2021-08-07 ENCOUNTER — Telehealth: Payer: Self-pay | Admitting: *Deleted

## 2021-08-07 NOTE — Telephone Encounter (Signed)
Returned call to patient, no answer,left vmessage for call back

## 2021-08-10 ENCOUNTER — Other Ambulatory Visit: Payer: Self-pay

## 2021-08-10 ENCOUNTER — Ambulatory Visit (INDEPENDENT_AMBULATORY_CARE_PROVIDER_SITE_OTHER): Payer: Medicare (Managed Care) | Admitting: Podiatry

## 2021-08-10 DIAGNOSIS — Z89412 Acquired absence of left great toe: Secondary | ICD-10-CM | POA: Diagnosis not present

## 2021-08-11 ENCOUNTER — Encounter (HOSPITAL_COMMUNITY): Payer: Self-pay | Admitting: Radiology

## 2021-08-11 MED ORDER — OXYCODONE-ACETAMINOPHEN 10-325 MG PO TABS: 1.0000 | ORAL_TABLET | Freq: Three times a day (TID) | ORAL | 0 refills | Status: AC | PRN

## 2021-08-11 NOTE — Progress Notes (Signed)
  Subjective:  Patient ID: Erika [name], female    DOB: [birth year],  MRN: [id number]  Chief Complaint  Patient presents with   Routine Post Op     DOS 07/11/2021 POV#3  left great toe  (no x-rays), suture removal.    DOS: 04/24/2021 Procedure: Fillet of fifth toe flap  65 y.o. female returns for post-op check overall doing well she still has some pain  Review of Systems: Negative except as noted in the HPI. Denies N/V/F/Ch.   Objective:   There were no vitals filed for this visit.  There is no height or weight on file to calculate BMI. Constitutional Well developed. Well nourished.  Vascular Foot warm and well perfused. Capillary refill normal to all digits.   Neurologic Normal speech. Oriented to person, place, and time. Epicritic sensation to light touch grossly present bilaterally.  Dermatologic Sutures and staples intact  Orthopedic: Tenderness to palpation noted about the surgical site.    ESR 25 she has thrombocytopenia at 47 and white blood cell count of 3.2  Noninvasive vascular testing with adequate perfusion Assessment:   1. Status post amputation of left great toe [facility])     Plan:  Patient was evaluated and treated and all questions answered.   Sutures and staples removed.  Appointment is set for a shoe filler.  She will be WBAT and is in regular shoes.  I refilled her pain medication at her request 1 last time and notified her that as of the last [facility] prescribed.  Follow-up as needed for this or other issues  Return if symptoms worsen or fail to improve.

## 2021-10-02 ENCOUNTER — Other Ambulatory Visit: Payer: Medicare (Managed Care)

## 2021-10-02 ENCOUNTER — Ambulatory Visit: Payer: Medicare (Managed Care)

## 2021-12-15 ENCOUNTER — Other Ambulatory Visit: Payer: Self-pay | Admitting: Adult Health

## 2021-12-15 DIAGNOSIS — Z1231 Encounter for screening mammogram for malignant neoplasm of breast: Secondary | ICD-10-CM

## 2021-12-22 ENCOUNTER — Ambulatory Visit
Admission: RE | Admit: 2021-12-22 | Discharge: 2021-12-22 | Disposition: A | Discharge status: Home / Self Care | Payer: Medicare (Managed Care) | Source: Ambulatory Visit | Attending: Adult Health | Admitting: Adult Health

## 2021-12-22 DIAGNOSIS — Z1231 Encounter for screening mammogram for malignant neoplasm of breast: Secondary | ICD-10-CM

## 2022-01-20 ENCOUNTER — Ambulatory Visit
Admission: RE | Admit: 2022-01-20 | Discharge: 2022-01-20 | Disposition: A | Discharge status: Home / Self Care | Payer: Medicare Other | Source: Ambulatory Visit | Attending: Adult Health | Admitting: Adult Health

## 2022-01-20 ENCOUNTER — Other Ambulatory Visit: Payer: Self-pay

## 2022-04-20 ENCOUNTER — Emergency Department (HOSPITAL_COMMUNITY): Payer: Medicare Other

## 2022-04-20 ENCOUNTER — Encounter (HOSPITAL_COMMUNITY): Payer: Self-pay | Admitting: Emergency Medicine

## 2022-04-20 ENCOUNTER — Inpatient Hospital Stay (HOSPITAL_COMMUNITY)
Admission: EM | Admit: 2022-04-20 | Discharge: 2022-04-22 | DRG: 504 | Disposition: A | Discharge status: Home / Self Care | Payer: Medicare Other | Attending: Internal Medicine | Admitting: Internal Medicine

## 2022-04-20 ENCOUNTER — Other Ambulatory Visit: Payer: Self-pay

## 2022-04-20 DIAGNOSIS — Z79899 Other long term (current) drug therapy: Secondary | ICD-10-CM

## 2022-04-20 DIAGNOSIS — M86171 Other acute osteomyelitis, right ankle and foot: Secondary | ICD-10-CM

## 2022-04-20 DIAGNOSIS — I10 Essential (primary) hypertension: Secondary | ICD-10-CM | POA: Diagnosis not present

## 2022-04-20 DIAGNOSIS — Z8619 Personal history of other infectious and parasitic diseases: Secondary | ICD-10-CM

## 2022-04-20 DIAGNOSIS — Z89421 Acquired absence of other right toe(s): Secondary | ICD-10-CM

## 2022-04-20 DIAGNOSIS — M86671 Other chronic osteomyelitis, right ankle and foot: Secondary | ICD-10-CM

## 2022-04-20 DIAGNOSIS — F32A Depression, unspecified: Secondary | ICD-10-CM | POA: Diagnosis present

## 2022-04-20 DIAGNOSIS — K219 Gastro-esophageal reflux disease without esophagitis: Secondary | ICD-10-CM | POA: Diagnosis present

## 2022-04-20 DIAGNOSIS — M869 Osteomyelitis, unspecified: Secondary | ICD-10-CM | POA: Diagnosis not present

## 2022-04-20 DIAGNOSIS — Z789 Other specified health status: Secondary | ICD-10-CM | POA: Diagnosis not present

## 2022-04-20 DIAGNOSIS — F1010 Alcohol abuse, uncomplicated: Secondary | ICD-10-CM | POA: Diagnosis present

## 2022-04-20 DIAGNOSIS — F329 Major depressive disorder, single episode, unspecified: Secondary | ICD-10-CM | POA: Diagnosis not present

## 2022-04-20 DIAGNOSIS — K08109 Complete loss of teeth, unspecified cause, unspecified class: Secondary | ICD-10-CM | POA: Diagnosis present

## 2022-04-20 DIAGNOSIS — M069 Rheumatoid arthritis, unspecified: Secondary | ICD-10-CM | POA: Diagnosis not present

## 2022-04-20 DIAGNOSIS — M1990 Unspecified osteoarthritis, unspecified site: Secondary | ICD-10-CM | POA: Diagnosis present

## 2022-04-20 DIAGNOSIS — F419 Anxiety disorder, unspecified: Secondary | ICD-10-CM | POA: Diagnosis present

## 2022-04-20 DIAGNOSIS — R200 Anesthesia of skin: Secondary | ICD-10-CM | POA: Diagnosis present

## 2022-04-20 DIAGNOSIS — D61818 Other pancytopenia: Secondary | ICD-10-CM | POA: Diagnosis present

## 2022-04-20 DIAGNOSIS — D649 Anemia, unspecified: Secondary | ICD-10-CM | POA: Diagnosis not present

## 2022-04-20 LAB — COMPREHENSIVE METABOLIC PANEL
ALT: 15 U/L (ref 0–44)
AST: 26 U/L (ref 15–41)
Albumin: 2.8 g/dL — ABNORMAL LOW (ref 3.5–5.0)
Alkaline Phosphatase: 136 U/L — ABNORMAL HIGH (ref 38–126)
Anion gap: 9 (ref 5–15)
BUN: 7 mg/dL — ABNORMAL LOW (ref 8–23)
CO2: 25 mmol/L (ref 22–32)
Calcium: 8.4 mg/dL — ABNORMAL LOW (ref 8.9–10.3)
Chloride: 106 mmol/L (ref 98–111)
Creatinine, Ser: 0.85 mg/dL (ref 0.44–1.00)
GFR, Estimated: >60 mL/min (ref >60)
Glucose, Bld: 141 mg/dL — ABNORMAL HIGH (ref 70–99)
Potassium: 3.6 mmol/L (ref 3.5–5.1)
Sodium: 140 mmol/L (ref 135–145)
Total Bilirubin: 1.3 mg/dL — ABNORMAL HIGH (ref 0.3–1.2)
Total Protein: 6.3 g/dL — ABNORMAL LOW (ref 6.5–8.1)

## 2022-04-20 LAB — CBC WITH DIFFERENTIAL/PLATELET
Abs Immature Granulocytes: 0.01 10*3/uL (ref 0.00–0.07)
Basophils Absolute: 0 10*3/uL (ref 0.0–0.1)
Basophils Relative: 0 %
Eosinophils Absolute: 0 10*3/uL (ref 0.0–0.5)
Eosinophils Relative: 0 %
HCT: 34.2 % — ABNORMAL LOW (ref 36.0–46.0)
Hemoglobin: 11.4 g/dL — ABNORMAL LOW (ref 12.0–15.0)
Immature Granulocytes: 0 %
Lymphocytes Relative: 28 %
Lymphs Abs: 0.8 10*3/uL (ref 0.7–4.0)
MCH: 32.5 pg (ref 26.0–34.0)
MCHC: 33.3 g/dL (ref 30.0–36.0)
MCV: 97.4 fL (ref 80.0–100.0)
Monocytes Absolute: 0.3 10*3/uL (ref 0.1–1.0)
Monocytes Relative: 10 %
Neutro Abs: 1.7 10*3/uL (ref 1.7–7.7)
Neutrophils Relative %: 62 %
Platelets: 52 10*3/uL — ABNORMAL LOW (ref 150–400)
RBC: 3.51 MIL/uL — ABNORMAL LOW (ref 3.87–5.11)
RDW: 15.1 % (ref 11.5–15.5)
WBC: 2.7 10*3/uL — ABNORMAL LOW (ref 4.0–10.5)
nRBC: 0 % (ref 0.0–0.2)

## 2022-04-20 LAB — LACTIC ACID, PLASMA
Lactic Acid, Venous: 1.2 mmol/L (ref 0.5–1.9)
Lactic Acid, Venous: 1.2 mmol/L (ref 0.5–1.9)

## 2022-04-20 MED ORDER — VENLAFAXINE HCL ER 150 MG PO CP24
150.0000 mg | ORAL_CAPSULE | Freq: Every day | ORAL | Status: DC
  Administered 2022-04-21 – 2022-04-22 (×2): 150 mg via ORAL
  Filled 2022-04-20 (×3): qty 1

## 2022-04-20 MED ORDER — VANCOMYCIN HCL 1500 MG/300ML IV SOLN
1500.0000 mg | Freq: Once | INTRAVENOUS | Status: AC
  Administered 2022-04-20: 1500 mg via INTRAVENOUS
  Filled 2022-04-20: qty 300

## 2022-04-20 MED ORDER — LAMOTRIGINE 100 MG PO TABS
50.0000 mg | ORAL_TABLET | Freq: Two times a day (BID) | ORAL | Status: DC
  Administered 2022-04-20 – 2022-04-21 (×3): 50 mg via ORAL
  Filled 2022-04-20: qty 2
  Filled 2022-04-20: qty 1
  Filled 2022-04-20 (×3): qty 2

## 2022-04-20 MED ORDER — VANCOMYCIN HCL 750 MG/150ML IV SOLN
750.0000 mg | INTRAVENOUS | Status: DC
  Administered 2022-04-21: 750 mg via INTRAVENOUS
  Filled 2022-04-20 (×3): qty 150

## 2022-04-20 MED ORDER — SODIUM CHLORIDE 0.9 % IV SOLN
2.0000 g | Freq: Three times a day (TID) | INTRAVENOUS | Status: DC
  Administered 2022-04-20 – 2022-04-22 (×6): 2 g via INTRAVENOUS
  Filled 2022-04-20 (×6): qty 12.5

## 2022-04-20 MED ORDER — ADULT MULTIVITAMIN W/MINERALS CH
1.0000 | ORAL_TABLET | Freq: Every day | ORAL | Status: DC
  Administered 2022-04-20 – 2022-04-22 (×3): 1 via ORAL
  Filled 2022-04-20 (×3): qty 1

## 2022-04-20 MED ORDER — BISACODYL 5 MG PO TBEC: 5.0000 mg | DELAYED_RELEASE_TABLET | Freq: Every day | ORAL | Status: DC | PRN

## 2022-04-20 MED ORDER — THIAMINE HCL 100 MG PO TABS
100.0000 mg | ORAL_TABLET | Freq: Every day | ORAL | Status: DC
  Administered 2022-04-20 – 2022-04-22 (×3): 100 mg via ORAL
  Filled 2022-04-20 (×3): qty 1

## 2022-04-20 MED ORDER — OXYCODONE HCL 5 MG PO TABS
5.0000 mg | ORAL_TABLET | ORAL | Status: DC | PRN
  Administered 2022-04-21 – 2022-04-22 (×4): 5 mg via ORAL
  Filled 2022-04-20 (×4): qty 1

## 2022-04-20 MED ORDER — LORAZEPAM 2 MG/ML IJ SOLN: 1.0000 mg | INTRAMUSCULAR | Status: DC | PRN

## 2022-04-20 MED ORDER — TRAZODONE HCL 50 MG PO TABS
100.0000 mg | ORAL_TABLET | Freq: Every day | ORAL | Status: DC
  Administered 2022-04-20 – 2022-04-21 (×2): 100 mg via ORAL
  Filled 2022-04-20 (×2): qty 2

## 2022-04-20 MED ORDER — PREGABALIN 100 MG PO CAPS
300.0000 mg | ORAL_CAPSULE | Freq: Two times a day (BID) | ORAL | Status: DC
  Administered 2022-04-20 – 2022-04-22 (×4): 300 mg via ORAL
  Filled 2022-04-20 (×4): qty 3

## 2022-04-20 MED ORDER — FOLIC ACID 1 MG PO TABS
1.0000 mg | ORAL_TABLET | Freq: Every day | ORAL | Status: DC
  Administered 2022-04-20 – 2022-04-22 (×3): 1 mg via ORAL
  Filled 2022-04-20 (×3): qty 1

## 2022-04-20 MED ORDER — METRONIDAZOLE 500 MG/100ML IV SOLN
500.0000 mg | Freq: Two times a day (BID) | INTRAVENOUS | Status: DC
  Administered 2022-04-20 – 2022-04-22 (×4): 500 mg via INTRAVENOUS
  Filled 2022-04-20 (×5): qty 100

## 2022-04-20 MED ORDER — FENTANYL CITRATE PF 50 MCG/ML IJ SOSY
50.0000 ug | PREFILLED_SYRINGE | Freq: Once | INTRAMUSCULAR | Status: AC
  Administered 2022-04-20: 50 ug via INTRAVENOUS
  Filled 2022-04-20: qty 1

## 2022-04-20 MED ORDER — THIAMINE HCL 100 MG/ML IJ SOLN: 100.0000 mg | Freq: Every day | INTRAMUSCULAR | Status: DC

## 2022-04-20 MED ORDER — THIAMINE HCL 100 MG PO TABS: 100.0000 mg | ORAL_TABLET | Freq: Every day | ORAL | Status: DC

## 2022-04-20 MED ORDER — LORAZEPAM 1 MG PO TABS: 1.0000 mg | ORAL_TABLET | ORAL | Status: DC | PRN

## 2022-04-20 MED ORDER — HYDROMORPHONE HCL 1 MG/ML IJ SOLN
0.5000 mg | INTRAMUSCULAR | Status: DC | PRN
  Administered 2022-04-20: 1 mg via INTRAVENOUS
  Filled 2022-04-20: qty 1

## 2022-04-20 NOTE — ED Triage Notes (Signed)
Pt states she has a wound on her right fourth toe. It has been there about a week. It is on the pad of her toe. Dried blood, open wound. Pt lost her 5th toe for a similar wound she states.

## 2022-04-20 NOTE — H&P (Signed)
History and Physical    Erika [name] NID:[id number] DOB: [birth year] DOA: 04/20/2022  PCP: Marletta Lor, NP (Confirm with patient/family/NH records and if not entered, this has to be entered at [facility] point of entry) Patient coming from: Home  I have personally briefly reviewed patient's old medical records in [facility]  Chief Complaint: Right foot pain and swelling  HPI: Erika [name] is a 66 y.o. female with medical history significant of alcohol abuse, pancytopenia secondary to alcohol abuse, rheumatoid arthritis, anxiety/depression, presented with worsening of right fourth toe swelling and pain.  Patient has osteomyelitis before status post right fifth toe amputation.  Last year, she had a normal ABI study of the right ankle.  This time, she started to have " bleeding under the fourth toenail" about 7 days ago, bleeding stopped by itself but the right fourth toe started to swell and developing a rash gradually getting worse.  She has had subjective fever and chills, and developed severe pain overnight of the right fourth toe.  ED Course: Afebrile, no tachycardia no hypotensive.  X-ray showed signs of osteomyelitis of the fourth distal phalanx  Chronic pancytopenia with WBC 2.7 and platelet 52.  Creatinine 0.8.  Review of Systems: As per HPI otherwise 14 point review of systems negative.    Past Medical History:  Diagnosis Date   Arthritis    Depression    GERD (gastroesophageal reflux disease)    History of shingles 10/18/2018   Hypertension    Rheumatoid arthritis(714.0)     Past Surgical History:  Procedure Laterality Date   AMPUTATION TOE Right 04/24/2021   Procedure: AMPUTATION TOE;  Surgeon: Edwin Cap, DPM;  Location: [facility] OR;  Service: Podiatry;  Laterality: Right;   AMPUTATION TOE Left 07/11/2021   Procedure: AMPUTATION TOE;  Surgeon: Asencion Islam, DPM;  Location: [facility] OR;  Service: Podiatry;  Laterality: Left;   BREAST REDUCTION SURGERY      REDUCTION MAMMAPLASTY Bilateral      reports that she has been smoking cigarettes. She has been smoking an average of .5 packs per day. She uses smokeless tobacco. She reports current alcohol use of about 5.0 standard drinks of alcohol per week. She reports current drug use. Drug: Marijuana.  No Known Allergies  Family History  Problem Relation Age of Onset   Psoriasis Sister      Prior to Admission medications   Medication Sig Start Date End Date Taking? Authorizing Provider  doxycycline (VIBRA-TABS) 100 MG tablet Take 1 tablet (100 mg total) by mouth 2 (two) times daily. 07/15/21   Candelaria Stagers, DPM  lamoTRIgine (LAMICTAL) 100 MG tablet Take 50 mg by mouth 2 (two) times daily.    [provider]  mupirocin ointment (BACTROBAN) 2 % Apply 1 application topically 2 (two) times daily. 06/25/21   McDonald, Rachelle Hora, DPM  pregabalin (LYRICA) 300 MG capsule Take 300 mg by mouth 2 (two) times daily. 06/02/21   [provider]  thiamine 100 MG tablet Take 1 tablet (100 mg total) by mouth daily. 07/13/21   Evlyn Kanner, MD  traZODone (DESYREL) 100 MG tablet Take 100 mg by mouth at bedtime.    [provider]  venlafaxine XR (EFFEXOR-XR) 150 MG 24 hr capsule Take 150 mg by mouth daily with breakfast.    [provider]    Physical Exam: Vitals:   04/20/22 0935 04/20/22 1231 04/20/22 1300  BP: 129/60 (!) 168/84 (!) 151/59  Pulse: 66 68 64  Resp: 18  18 18  Temp: 97.8 F (36.6 C) 97.9 F (36.6 C) 97.9 F (36.6 C)  TempSrc: Oral Oral Oral  SpO2: 91% 95%     Constitutional: NAD, calm, comfortable Vitals:   04/20/22 0935 04/20/22 1231 04/20/22 1300  BP: 129/60 (!) 168/84 (!) 151/59  Pulse: 66 68 64  Resp: 18 18 18   Temp: 97.8 F (36.6 C) 97.9 F (36.6 C) 97.9 F (36.6 C)  TempSrc: Oral Oral Oral  SpO2: 91% 95%    Eyes: PERRL, lids and conjunctivae normal ENMT: Mucous membranes are moist. Posterior pharynx clear of any exudate or lesions.Normal  dentition.  Neck: normal, supple, no masses, no thyromegaly Respiratory: clear to auscultation bilaterally, no wheezing, no crackles. Normal respiratory effort. No accessory muscle use.  Cardiovascular: Regular rate and rhythm, no murmurs / rubs / gallops. No extremity edema. 2+ pedal pulses. No carotid bruits.  Abdomen: no tenderness, no masses palpated. No hepatosplenomegaly. Bowel sounds positive.  Musculoskeletal: no clubbing / cyanosis. No joint deformity upper and lower extremities. Good ROM, no contractures. Normal muscle tone.  Skin: no rashes, lesions, ulcers. No induration Neurologic: CN 2-12 grossly intact. Sensation intact, DTR normal. Strength 5/5 in all 4.  Psychiatric: Normal judgment and insight. Alert and oriented x 3. Normal mood.      Labs on Admission: I have personally reviewed following labs and imaging studies  CBC: Recent Labs  Lab 04/20/22 0948  WBC 2.7*  NEUTROABS 1.7  HGB 11.4*  HCT 34.2*  MCV 97.4  PLT 52*   Basic Metabolic Panel: Recent Labs  Lab 04/20/22 0948  NA 140  K 3.6  CL 106  CO2 25  GLUCOSE 141*  BUN 7*  CREATININE 0.85  CALCIUM 8.4*   GFR: CrCl cannot be calculated (Unknown ideal weight.). Liver Function Tests: Recent Labs  Lab 04/20/22 0948  AST 26  ALT 15  ALKPHOS 136*  BILITOT 1.3*  PROT 6.3*  ALBUMIN 2.8*   No results for input(s): "LIPASE", "AMYLASE" in the last 168 hours. No results for input(s): "AMMONIA" in the last 168 hours. Coagulation Profile: No results for input(s): "INR", "PROTIME" in the last 168 hours. Cardiac Enzymes: No results for input(s): "CKTOTAL", "CKMB", "CKMBINDEX", "TROPONINI" in the last 168 hours. BNP (last 3 results) No results for input(s): "PROBNP" in the last 8760 hours. HbA1C: No results for input(s): "HGBA1C" in the last 72 hours. CBG: No results for input(s): "GLUCAP" in the last 168 hours. Lipid Profile: No results for input(s): "CHOL", "HDL", "LDLCALC", "TRIG", "CHOLHDL",  "LDLDIRECT" in the last 72 hours. Thyroid Function Tests: No results for input(s): "TSH", "T4TOTAL", "FREET4", "T3FREE", "THYROIDAB" in the last 72 hours. Anemia Panel: No results for input(s): "VITAMINB12", "FOLATE", "FERRITIN", "TIBC", "IRON", "RETICCTPCT" in the last 72 hours. Urine analysis: No results found for: "COLORURINE", "APPEARANCEUR", "LABSPEC", "PHURINE", "GLUCOSEU", "HGBUR", "BILIRUBINUR", "KETONESUR", "PROTEINUR", "UROBILINOGEN", "NITRITE", "LEUKOCYTESUR"  Radiological Exams on Admission: DG Toe 4th Right  Result Date: 04/20/2022 CLINICAL DATA:  Right fourth toe wound infection. EXAM: RIGHT FOURTH TOE COMPARISON:  Right foot x-rays dated April 24, 2021. FINDINGS: New severe soft tissue swelling of the fourth toe with bony destruction of the fourth distal phalanx. Prior amputation of the fifth toe. No acute fracture or dislocation. IMPRESSION: 1. New severe soft tissue swelling of the fourth toe with osteomyelitis of the fourth distal phalanx. Electronically Signed   By: April 26, 2021 M.D.   On: 04/20/2022 12:57    EKG: Independently reviewed.  Sinus, no acute ST changes.  Assessment/Plan Active  Problems:   Alcohol use disorder   Osteomyelitis (HCC)  (please populate well all problems here in Problem List. (For example, if patient is on BP meds at home and you resume or decide to hold them, it is a problem that needs to be her. Same for CAD, COPD, HLD and so on)  Acute right fourth toe osteomyelitis -Continue antibiotics vancomycin cefepime and Flagyl -Podiatry Dr. Ralene Cork consulted in ED -Will keep patient n.p.o. after midnight for possible pending. -Pain control, avoid NSAIDs due to severe thrombocytopenia. -She has a normal ABI study right leg less than a year ago.  Pancytopenia -Stable WBC count and platelet count -Hold off chemical DVT prophylaxis  Alcohol use -No symptoms signs of alcohol withdrawal -CIWA protocol with as needed  benzos  Anxiety/depression -Continue lamotrigine, continue SSRI and SNRI  DVT prophylaxis: Ambulation Code Status: Full code Family Communication: None at bedside Disposition Plan: Patient sick with osteomyelitis requiring IV antibiotics and surgical intervention, expect more than 2 midnight hospital stay. Consults called: Podiatry Admission status: MedSurg admission   Emeline General MD [facility] Pager [phone]  04/20/2022, 2:02 PM

## 2022-04-20 NOTE — H&P (View-Only) (Signed)
Subjective:  Patient ID: Erika [name], female    DOB: 1956-10-07,  MRN: [id number]  Patient with past medical history of alcohol abuse, pancytophenia, RA, depression seen at beside today for worsening right foot wound swelling and pain. Relates about a week ago the fourth toe started to bleed and swell. She has had previous amputation of the right fifth digit. Relates fevers and chills recently and so presented to the ED.   Past Medical History:  Diagnosis Date   Arthritis    Depression    GERD (gastroesophageal reflux disease)    History of shingles 10/18/2018   Hypertension    Rheumatoid arthritis(714.0)      Past Surgical History:  Procedure Laterality Date   AMPUTATION TOE Right 04/24/2021   Procedure: AMPUTATION TOE;  Surgeon: Edwin Cap, DPM;  Location: [facility] OR;  Service: Podiatry;  Laterality: Right;   AMPUTATION TOE Left 07/11/2021   Procedure: AMPUTATION TOE;  Surgeon: Asencion Islam, DPM;  Location: [facility] OR;  Service: Podiatry;  Laterality: Left;   BREAST REDUCTION SURGERY     REDUCTION MAMMAPLASTY Bilateral        Latest Ref Rng & Units 04/20/2022    9:48 AM 07/12/2021    1:21 AM 07/11/2021    7:24 AM  CBC  WBC 4.0 - 10.5 K/uL 2.7  2.5  2.7   Hemoglobin 12.0 - 15.0 g/dL 24.4  01.0  27.2   Hematocrit 36.0 - 46.0 % 34.2  30.2  32.6   Platelets 150 - 400 K/uL 52  63  67        Latest Ref Rng & Units 04/20/2022    9:48 AM 07/12/2021    1:21 AM 07/11/2021    7:24 AM  BMP  Glucose 70 - 99 mg/dL 536  82  644   BUN 8 - 23 mg/dL 7  11  12    Creatinine 0.44 - 1.00 mg/dL  0.34  7.42   Sodium 135 - 145 mmol/L 140  137  137   Potassium 3.5 - 5.1 mmol/L 3.6  4.4  4.5   Chloride 98 - 111 mmol/L 106  109  110   CO2 22 - 32 mmol/L 25  23  22    Calcium 8.9 - 10.3 mg/dL 8.4  8.7  8.5      Objective:   Vitals:   04/20/22 1704 04/20/22 1705  BP: (!) 141/66 (!) 141/66  Pulse: (!) 56 (!) 57  Resp: 15 16  Temp:    SpO2: 95% 97%    General:AA&O x 3. Normal  mood and affect   Vascular: DP and PT pulses 2/4 bilateral. Brisk capillary refill to all digits. Pedal hair present   Neruological. Epicritic sensation grossly intact.   Derm: Right fourth digit wound with erythema and edema noted  to distal aspect of the digit. No purulence noted and no probe to bone.   MSK: MMT 5/5 in dorsiflexion, plantar flexion, inversion and eversion. Normal joint ROM without pain or crepitus.    XR right foot:     IMPRESSION: 1. New severe soft tissue swelling of the fourth toe with osteomyelitis of the fourth distal phalanx.      Assessment & Plan:  Patient was evaluated and treated and all questions answered.  DX: Right fourth digit osteomyelitis  Wound care: betadine, DSD  Antibiotics: Per primary  DME: post-op shoe   Discussed with patient diagnosis and treatment options.  Imaging reviewed. Radiographs reveal erosions and osteomyelitis in distal phalanx  of fourth digit Discussed treatment options with patient including partial amputation of the fourth digit.   Patient in agreement with plan and all questions answered.  Will plan for OR tomorrow afternoon.  Patient to be NPO after midnight.   Louann Sjogren, DPM  Accessible via secure chat for questions or concerns.

## 2022-04-20 NOTE — Progress Notes (Signed)
Pharmacy Antibiotic Note  Erika [name] is a 66 y.o. female admitted on 04/20/2022 with  diabetic foot wound infection .  Pharmacy has been consulted for vancomycin dosing. Also with cefepime/metronidazole ordered x 1 per EDP. SCr 0.85 on presentation.  Plan: Vancomycin 1500mg  IV x 1; then 750mg  IV q24h. Goal AUC 400-550. Expected AUC: 496 SCr used: 0.85 Cefepime 2g IV x 1 - f/u if plan to continue Metronidazole 500mg  IV x 1 - f/u if plan to continue Monitor clinical progress, c/s, renal function F/u de-escalation plan/LOT, vancomycin levels as indicated     Temp (24hrs), Avg:97.9 F (36.6 C), Min:97.8 F (36.6 C), Max:97.9 F (36.6 C)  Recent Labs  Lab 04/20/22 0948 04/20/22 1220  WBC 2.7*  --   CREATININE 0.85  --   LATICACIDVEN 1.2 1.2    CrCl cannot be calculated (Unknown ideal weight.).    No Known Allergies   , PharmD, BCPS Please check AMION for all [facility] contact numbers Clinical Pharmacist 04/20/2022 1:28 PM

## 2022-04-20 NOTE — Consult Note (Signed)
Subjective:  Patient ID: Erika [name], female    DOB: 12/30/1955,  MRN: [id number]  Patient with past medical history of alcohol abuse, pancytophenia, RA, depression seen at beside today for worsening right foot wound swelling and pain. Relates about a week ago the fourth toe started to bleed and swell. She has had previous amputation of the right fifth digit. Relates fevers and chills recently and so presented to the ED.   Past Medical History:  Diagnosis Date   Arthritis    Depression    GERD (gastroesophageal reflux disease)    History of shingles 10/18/2018   Hypertension    Rheumatoid arthritis(714.0)      Past Surgical History:  Procedure Laterality Date   AMPUTATION TOE Right 04/24/2021   Procedure: AMPUTATION TOE;  Surgeon: McDonald, Adam R, DPM;  Location: [facility];  Service: Podiatry;  Laterality: Right;   AMPUTATION TOE Left 07/11/2021   Procedure: AMPUTATION TOE;  Surgeon: Stover, Titorya, DPM;  Location: [facility] OR;  Service: Podiatry;  Laterality: Left;   BREAST REDUCTION SURGERY     REDUCTION MAMMAPLASTY Bilateral        Latest Ref Rng & Units 04/20/2022    9:48 AM 07/12/2021    1:21 AM 07/11/2021    7:24 AM  CBC  WBC 4.0 - 10.5 K/uL 2.7  2.5  2.7   Hemoglobin 12.0 - 15.0 g/dL 11.4  10.3  10.9   Hematocrit 36.0 - 46.0 % 34.2  30.2  32.6   Platelets 150 - 400 K/uL 52  63  67        Latest Ref Rng & Units 04/20/2022    9:48 AM 07/12/2021    1:21 AM 07/11/2021    7:24 AM  BMP  Glucose 70 - 99 mg/dL 141  82  101   BUN 8 - 23 mg/dL 7  11  12   Creatinine 0.44 - 1.00 mg/dL 0.85  0.78  0.94   Sodium 135 - 145 mmol/L 140  137  137   Potassium 3.5 - 5.1 mmol/L 3.6  4.4  4.5   Chloride 98 - 111 mmol/L 106  109  110   CO2 22 - 32 mmol/L 25  23  22   Calcium 8.9 - 10.3 mg/dL 8.4  8.7  8.5      Objective:   Vitals:   04/20/22 1704 04/20/22 1705  BP: (!) 141/66 (!) 141/66  Pulse: (!) 56 (!) 57  Resp: 15 16  Temp:    SpO2: 95% 97%    General:AA&O x 3. Normal  mood and affect   Vascular: DP and PT pulses 2/4 bilateral. Brisk capillary refill to all digits. Pedal hair present   Neruological. Epicritic sensation grossly intact.   Derm: Right fourth digit wound with erythema and edema noted  to distal aspect of the digit. No purulence noted and no probe to bone.   MSK: MMT 5/5 in dorsiflexion, plantar flexion, inversion and eversion. Normal joint ROM without pain or crepitus.    XR right foot:     IMPRESSION: 1. New severe soft tissue swelling of the fourth toe with osteomyelitis of the fourth distal phalanx.      Assessment & Plan:  Patient was evaluated and treated and all questions answered.  DX: Right fourth digit osteomyelitis  Wound care: betadine, DSD  Antibiotics: Per primary  DME: post-op shoe   Discussed with patient diagnosis and treatment options.  Imaging reviewed. Radiographs reveal erosions and osteomyelitis in distal phalanx   of fourth digit Discussed treatment options with patient including partial amputation of the fourth digit.   Patient in agreement with plan and all questions answered.  Will plan for OR tomorrow afternoon.  Patient to be NPO after midnight.   Louann Sjogren, DPM  Accessible via secure chat for questions or concerns.

## 2022-04-20 NOTE — ED Notes (Signed)
Patient transported to X-ray 

## 2022-04-20 NOTE — ED Provider Notes (Signed)
[facility] EMERGENCY DEPARTMENT Provider Note   CSN: [id number] Arrival date & time: 04/20/22  4742     History  Chief Complaint  Patient presents with   Wound Check    Erika [name] is a 66 y.o. female.  Presenting to ER due to concern for wound check.  Patient reports that 1 week ago she started having a wound on her right fourth toe.  Says that she does not recall any specific injuries.  Says that the pain has been getting worse.  No fevers or chills.  Has had to have her right fifth toe amputated.  HPI     Home Medications Prior to Admission medications   Medication Sig Start Date End Date Taking? Authorizing Provider  doxycycline (VIBRA-TABS) 100 MG tablet Take 1 tablet (100 mg total) by mouth 2 (two) times daily. 07/15/21   Candelaria Stagers, DPM  lamoTRIgine (LAMICTAL) 100 MG tablet Take 50 mg by mouth 2 (two) times daily.    [provider]  mupirocin ointment (BACTROBAN) 2 % Apply 1 application topically 2 (two) times daily. 06/25/21   McDonald, Rachelle Hora, DPM  pregabalin (LYRICA) 300 MG capsule Take 300 mg by mouth 2 (two) times daily. 06/02/21   [provider]  thiamine 100 MG tablet Take 1 tablet (100 mg total) by mouth daily. 07/13/21   Evlyn Kanner, MD  traZODone (DESYREL) 100 MG tablet Take 100 mg by mouth at bedtime.    [provider]  venlafaxine XR (EFFEXOR-XR) 150 MG 24 hr capsule Take 150 mg by mouth daily with breakfast.    [provider]      Allergies    Patient has no known allergies.    Review of Systems   Review of Systems  Musculoskeletal:  Positive for arthralgias.  All other systems reviewed and are negative.   Physical Exam Updated Vital Signs BP (!) 168/84 (BP Location: Right Arm)   Pulse 68   Temp 97.9 F (36.6 C) (Oral)   Resp 18   SpO2 95%  Physical Exam Vitals and nursing note reviewed.  Constitutional:      General: She is not in acute distress.    Appearance: She is  well-developed.  HENT:     Head: Normocephalic and atraumatic.  Eyes:     Conjunctiva/sclera: Conjunctivae normal.  Cardiovascular:     Rate and Rhythm: Normal rate and regular rhythm.     Heart sounds: No murmur heard. Pulmonary:     Effort: Pulmonary effort is normal. No respiratory distress.     Breath sounds: Normal breath sounds.  Abdominal:     Palpations: Abdomen is soft.     Tenderness: There is no abdominal tenderness.  Musculoskeletal:     Cervical back: Neck supple.     Comments: Right foot: There is a good DP pulse, sensation to light touch intact in foot, her fourth toe is swollen, there is approximately 0.5 cm wound to the tip of the toe, there is generalized erythema over the toe  Skin:    General: Skin is warm and dry.     Capillary Refill: Capillary refill takes less than 2 seconds.  Neurological:     Mental Status: She is alert.  Psychiatric:        Mood and Affect: Mood normal.     ED Results / Procedures / Treatments   Labs (all labs ordered are listed, but only abnormal results are displayed) Labs Reviewed  COMPREHENSIVE METABOLIC PANEL -  Abnormal; Notable for the following components:      Result Value   Glucose, Bld 141 (*)    BUN 7 (*)    Calcium 8.4 (*)    Total Protein 6.3 (*)    Albumin 2.8 (*)    Alkaline Phosphatase 136 (*)    Total Bilirubin 1.3 (*)    All other components within normal limits  CBC WITH DIFFERENTIAL/PLATELET - Abnormal; Notable for the following components:   WBC 2.7 (*)    RBC 3.51 (*)    Hemoglobin 11.4 (*)    HCT 34.2 (*)    Platelets 52 (*)    All other components within normal limits  LACTIC ACID, PLASMA  LACTIC ACID, PLASMA  URINALYSIS, ROUTINE W REFLEX MICROSCOPIC    EKG None  Radiology DG Toe 4th Right  Result Date: 04/20/2022 CLINICAL DATA:  Right fourth toe wound infection. EXAM: RIGHT FOURTH TOE COMPARISON:  Right foot x-rays dated April 24, 2021. FINDINGS: New severe soft tissue swelling of the fourth  toe with bony destruction of the fourth distal phalanx. Prior amputation of the fifth toe. No acute fracture or dislocation. IMPRESSION: 1. New severe soft tissue swelling of the fourth toe with osteomyelitis of the fourth distal phalanx. Electronically Signed   By: Obie Dredge M.D.   On: 04/20/2022 12:57    Procedures Procedures    Medications Ordered in ED Medications  metroNIDAZOLE (FLAGYL) IVPB 500 mg (has no administration in time range)  ceFEPIme (MAXIPIME) 2 g in sodium chloride 0.9 % 100 mL IVPB (has no administration in time range)  vancomycin (VANCOREADY) IVPB 1500 mg/300 mL (has no administration in time range)  vancomycin (VANCOREADY) IVPB 750 mg/150 mL (has no administration in time range)  fentaNYL (SUBLIMAZE) injection 50 mcg (50 mcg Intravenous Given 04/20/22 1259)    ED Course/ Medical Decision Making/ A&P                           Medical Decision Making Amount and/or Complexity of Data Reviewed Labs: ordered. Radiology: ordered.  Risk Prescription drug management. Decision regarding hospitalization.   66 year old lady with history of diabetes, peripheral vascular disease presenting for toe wound.  On exam her right fourth toe appears red, swollen, there does appear to be new wound to the tip.  Labs stable, no leukocytosis, no lactic acidosis.  I independently reviewed and interpreted her x-ray, there is obvious bony destruction to the tip of her fourth toe concerning for osteomyelitis.  Confirmed by radiology.  I discussed the case with Dr. Ralene Cork on-call for podiatry.  Their team will consult on patient, recommending medicine admission, IV antibiotics.  Have placed consult to medicine for admission.  Updated patient, provided pain control with fentanyl.        Final Clinical Impression(s) / ED Diagnoses Final diagnoses:  Osteomyelitis, unspecified site, unspecified type [facility])    Rx / DC Orders ED Discharge Orders     None         Milagros Loll, MD 04/20/22 1341

## 2022-04-21 ENCOUNTER — Encounter (HOSPITAL_COMMUNITY): Payer: Self-pay | Admitting: Internal Medicine

## 2022-04-21 ENCOUNTER — Other Ambulatory Visit: Payer: Self-pay

## 2022-04-21 ENCOUNTER — Inpatient Hospital Stay (HOSPITAL_COMMUNITY): Payer: Medicare Other

## 2022-04-21 ENCOUNTER — Encounter (HOSPITAL_COMMUNITY)
Admission: EM | Disposition: A | Discharge status: Home / Self Care | Payer: Self-pay | Source: Home / Self Care | Attending: Internal Medicine

## 2022-04-21 ENCOUNTER — Inpatient Hospital Stay (HOSPITAL_COMMUNITY): Payer: Medicare Other | Admitting: Anesthesiology

## 2022-04-21 DIAGNOSIS — F1010 Alcohol abuse, uncomplicated: Secondary | ICD-10-CM | POA: Diagnosis not present

## 2022-04-21 DIAGNOSIS — D61818 Other pancytopenia: Secondary | ICD-10-CM | POA: Diagnosis not present

## 2022-04-21 DIAGNOSIS — F419 Anxiety disorder, unspecified: Secondary | ICD-10-CM | POA: Diagnosis present

## 2022-04-21 DIAGNOSIS — F32A Depression, unspecified: Secondary | ICD-10-CM

## 2022-04-21 DIAGNOSIS — Z789 Other specified health status: Secondary | ICD-10-CM

## 2022-04-21 DIAGNOSIS — D649 Anemia, unspecified: Secondary | ICD-10-CM

## 2022-04-21 DIAGNOSIS — M86671 Other chronic osteomyelitis, right ankle and foot: Secondary | ICD-10-CM

## 2022-04-21 DIAGNOSIS — M069 Rheumatoid arthritis, unspecified: Secondary | ICD-10-CM

## 2022-04-21 DIAGNOSIS — I10 Essential (primary) hypertension: Secondary | ICD-10-CM

## 2022-04-21 DIAGNOSIS — M86171 Other acute osteomyelitis, right ankle and foot: Secondary | ICD-10-CM | POA: Diagnosis not present

## 2022-04-21 DIAGNOSIS — M869 Osteomyelitis, unspecified: Secondary | ICD-10-CM

## 2022-04-21 HISTORY — PX: AMPUTATION TOE: SHX6595

## 2022-04-21 LAB — URINALYSIS, ROUTINE W REFLEX MICROSCOPIC
Bilirubin Urine: NEGATIVE
Glucose, UA: >=500 mg/dL — AB
Hgb urine dipstick: NEGATIVE
Ketones, ur: NEGATIVE mg/dL
Nitrite: NEGATIVE
Protein, ur: 100 mg/dL — AB
Specific Gravity, Urine: 1.009 (ref 1.005–1.030)
pH: 5 (ref 5.0–8.0)

## 2022-04-21 LAB — CBC
HCT: 30.6 % — ABNORMAL LOW (ref 36.0–46.0)
Hemoglobin: 10.5 g/dL — ABNORMAL LOW (ref 12.0–15.0)
MCH: 33.1 pg (ref 26.0–34.0)
MCHC: 34.3 g/dL (ref 30.0–36.0)
MCV: 96.5 fL (ref 80.0–100.0)
Platelets: 47 10*3/uL — ABNORMAL LOW (ref 150–400)
RBC: 3.17 MIL/uL — ABNORMAL LOW (ref 3.87–5.11)
RDW: 15 % (ref 11.5–15.5)
WBC: 2.1 10*3/uL — ABNORMAL LOW (ref 4.0–10.5)
nRBC: 0 % (ref 0.0–0.2)

## 2022-04-21 LAB — SURGICAL PCR SCREEN
MRSA, PCR: NEGATIVE
Staphylococcus aureus: NEGATIVE

## 2022-04-21 SURGERY — AMPUTATION, TOE
Anesthesia: Monitor Anesthesia Care | Site: Toe | Laterality: Right

## 2022-04-21 MED ORDER — ACETAMINOPHEN 325 MG PO TABS: 325.0000 mg | ORAL_TABLET | ORAL | Status: DC | PRN

## 2022-04-21 MED ORDER — MEPERIDINE HCL 25 MG/ML IJ SOLN: 6.2500 mg | INTRAMUSCULAR | Status: DC | PRN

## 2022-04-21 MED ORDER — ORAL CARE MOUTH RINSE: 15.0000 mL | Freq: Once | OROMUCOSAL | Status: AC

## 2022-04-21 MED ORDER — MIDAZOLAM HCL 2 MG/2ML IJ SOLN
INTRAMUSCULAR | Status: AC
Start: 2022-04-21 — End: ?
  Filled 2022-04-21: qty 2

## 2022-04-21 MED ORDER — ROCURONIUM BROMIDE 10 MG/ML (PF) SYRINGE
PREFILLED_SYRINGE | INTRAVENOUS | Status: AC
  Filled 2022-04-21: qty 10

## 2022-04-21 MED ORDER — CHLORHEXIDINE GLUCONATE 0.12 % MT SOLN: 15.0000 mL | Freq: Once | OROMUCOSAL | Status: AC

## 2022-04-21 MED ORDER — PANTOPRAZOLE SODIUM 40 MG PO TBEC
40.0000 mg | DELAYED_RELEASE_TABLET | Freq: Every day | ORAL | Status: DC
  Administered 2022-04-21 – 2022-04-22 (×2): 40 mg via ORAL
  Filled 2022-04-21 (×2): qty 1

## 2022-04-21 MED ORDER — PROPOFOL 500 MG/50ML IV EMUL
INTRAVENOUS | Status: DC | PRN
  Administered 2022-04-21: 50 ug/kg/min via INTRAVENOUS

## 2022-04-21 MED ORDER — PROPOFOL 10 MG/ML IV BOLUS
INTRAVENOUS | Status: AC
Start: 2022-04-21 — End: ?
  Filled 2022-04-21: qty 20

## 2022-04-21 MED ORDER — LACTATED RINGERS IV SOLN
INTRAVENOUS | Status: DC
Start: 2022-04-21 — End: 2022-04-21

## 2022-04-21 MED ORDER — LIDOCAINE HCL (PF) 1 % IJ SOLN
INTRAMUSCULAR | Status: DC | PRN
  Administered 2022-04-21: 8 mL

## 2022-04-21 MED ORDER — FENTANYL CITRATE (PF) 250 MCG/5ML IJ SOLN
INTRAMUSCULAR | Status: AC
  Filled 2022-04-21: qty 5

## 2022-04-21 MED ORDER — ONDANSETRON HCL 4 MG/2ML IJ SOLN: 4.0000 mg | Freq: Once | INTRAMUSCULAR | Status: DC | PRN

## 2022-04-21 MED ORDER — PHENYLEPHRINE 80 MCG/ML (10ML) SYRINGE FOR IV PUSH (FOR BLOOD PRESSURE SUPPORT)
PREFILLED_SYRINGE | INTRAVENOUS | Status: AC
  Filled 2022-04-21: qty 30

## 2022-04-21 MED ORDER — DEXAMETHASONE SODIUM PHOSPHATE 10 MG/ML IJ SOLN
INTRAMUSCULAR | Status: AC
  Filled 2022-04-21: qty 2

## 2022-04-21 MED ORDER — MIDAZOLAM HCL 2 MG/2ML IJ SOLN
INTRAMUSCULAR | Status: DC | PRN
  Administered 2022-04-21 (×2): 1 mg via INTRAVENOUS

## 2022-04-21 MED ORDER — OXYCODONE HCL 5 MG PO TABS: 5.0000 mg | ORAL_TABLET | Freq: Once | ORAL | Status: DC | PRN

## 2022-04-21 MED ORDER — FLUOXETINE HCL 20 MG PO CAPS
20.0000 mg | ORAL_CAPSULE | Freq: Every day | ORAL | Status: DC
  Administered 2022-04-22: 20 mg via ORAL
  Filled 2022-04-21: qty 1

## 2022-04-21 MED ORDER — FENTANYL CITRATE (PF) 250 MCG/5ML IJ SOLN
INTRAMUSCULAR | Status: DC | PRN
  Administered 2022-04-21: 25 ug via INTRAVENOUS

## 2022-04-21 MED ORDER — CHLORHEXIDINE GLUCONATE 0.12 % MT SOLN
OROMUCOSAL | Status: AC
  Administered 2022-04-21: 15 mL via OROMUCOSAL
  Filled 2022-04-21: qty 15

## 2022-04-21 MED ORDER — FENTANYL CITRATE (PF) 100 MCG/2ML IJ SOLN: 25.0000 ug | INTRAMUSCULAR | Status: DC | PRN

## 2022-04-21 MED ORDER — ONDANSETRON HCL 4 MG/2ML IJ SOLN
INTRAMUSCULAR | Status: AC
  Filled 2022-04-21: qty 4

## 2022-04-21 MED ORDER — OXYCODONE HCL 5 MG/5ML PO SOLN: 5.0000 mg | Freq: Once | ORAL | Status: DC | PRN

## 2022-04-21 MED ORDER — ONDANSETRON HCL 4 MG/2ML IJ SOLN
INTRAMUSCULAR | Status: DC | PRN
  Administered 2022-04-21: 4 mg via INTRAVENOUS

## 2022-04-21 MED ORDER — ACETAMINOPHEN 160 MG/5ML PO SOLN: 325.0000 mg | ORAL | Status: DC | PRN

## 2022-04-21 MED ORDER — EPHEDRINE 5 MG/ML INJ
INTRAVENOUS | Status: AC
  Filled 2022-04-21: qty 5

## 2022-04-21 SURGICAL SUPPLY — 32 items
BAG COUNTER SPONGE SURGICOUNT (BAG) ×2 IMPLANT
BAG SPNG CNTER NS LX DISP (BAG) ×1
BLADE LONG MED 31X9 (MISCELLANEOUS) IMPLANT
BNDG CMPR 9X4 STRL LF SNTH (GAUZE/BANDAGES/DRESSINGS) ×1
BNDG CONFORM 2 STRL LF (GAUZE/BANDAGES/DRESSINGS) ×2 IMPLANT
BNDG ELASTIC 3X5.8 VLCR STR LF (GAUZE/BANDAGES/DRESSINGS) ×2 IMPLANT
BNDG ESMARK 4X9 LF (GAUZE/BANDAGES/DRESSINGS) ×2 IMPLANT
BNDG GAUZE ELAST 4 BULKY (GAUZE/BANDAGES/DRESSINGS) ×2 IMPLANT
CUFF TOURN SGL QUICK 18X4 (TOURNIQUET CUFF) IMPLANT
DRSG ADAPTIC 3X8 NADH LF (GAUZE/BANDAGES/DRESSINGS) ×1 IMPLANT
DRSG EMULSION OIL 3X3 NADH (GAUZE/BANDAGES/DRESSINGS) ×2 IMPLANT
DURAPREP 26ML APPLICATOR (WOUND CARE) ×2 IMPLANT
ELECT REM PT RETURN 9FT ADLT (ELECTROSURGICAL) ×2
ELECTRODE REM PT RTRN 9FT ADLT (ELECTROSURGICAL) ×1 IMPLANT
GAUZE SPONGE 4X4 12PLY STRL (GAUZE/BANDAGES/DRESSINGS) ×2 IMPLANT
GLOVE BIO SURGEON STRL SZ8 (GLOVE) ×4 IMPLANT
GOWN STRL REUS W/ TWL LRG LVL3 (GOWN DISPOSABLE) ×1 IMPLANT
GOWN STRL REUS W/ TWL XL LVL3 (GOWN DISPOSABLE) ×1 IMPLANT
GOWN STRL REUS W/TWL LRG LVL3 (GOWN DISPOSABLE) ×2
GOWN STRL REUS W/TWL XL LVL3 (GOWN DISPOSABLE) ×2
KIT BASIN OR (CUSTOM PROCEDURE TRAY) ×2 IMPLANT
NDL HYPO 25X1 1.5 SAFETY (NEEDLE) ×1 IMPLANT
NEEDLE HYPO 25X1 1.5 SAFETY (NEEDLE) ×2 IMPLANT
NS IRRIG 1000ML POUR BTL (IV SOLUTION) IMPLANT
PACK ORTHO EXTREMITY (CUSTOM PROCEDURE TRAY) ×2 IMPLANT
SUCTION FRAZIER HANDLE 10FR (MISCELLANEOUS) ×2
SUCTION TUBE FRAZIER 10FR DISP (MISCELLANEOUS) ×1 IMPLANT
SUT PROLENE 3 0 PS 2 (SUTURE) ×2 IMPLANT
SYR 10ML LL (SYRINGE) IMPLANT
TUBE CONNECTING 12X1/4 (SUCTIONS) ×2 IMPLANT
UNDERPAD 30X36 HEAVY ABSORB (UNDERPADS AND DIAPERS) ×2 IMPLANT
YANKAUER SUCT BULB TIP NO VENT (SUCTIONS) IMPLANT

## 2022-04-21 NOTE — Anesthesia Procedure Notes (Signed)
Procedure Name: MAC Date/Time: 04/21/2022 5:22 PM  Performed by: Carolan Clines, CRNAPre-anesthesia Checklist: Patient identified and Emergency Drugs available Patient Re-evaluated:Patient Re-evaluated prior to induction Oxygen Delivery Method: Simple face mask Dental Injury: Teeth and Oropharynx as per pre-operative assessment

## 2022-04-21 NOTE — Anesthesia Preprocedure Evaluation (Signed)
Anesthesia Evaluation  Patient identified by MRN, date of birth, ID band Patient awake    Reviewed: Allergy & Precautions, NPO status , Patient's Chart, lab work & pertinent test results  Airway Mallampati: I  TM Distance: >3 FB Neck ROM: Full    Dental  (+) Edentulous Upper, Edentulous Lower   Pulmonary Current Smoker and Patient abstained from smoking.,    breath sounds clear to auscultation       Cardiovascular hypertension,  Rhythm:Regular Rate:Normal     Neuro/Psych PSYCHIATRIC DISORDERS Depression    GI/Hepatic Neg liver ROS, GERD  ,  Endo/Other  negative endocrine ROS  Renal/GU negative Renal ROS     Musculoskeletal  (+) Arthritis , Rheumatoid disorders,    Abdominal Normal abdominal exam  (+)   Peds  Hematology negative hematology ROS (+) Blood dyscrasia, anemia ,   Anesthesia Other Findings   Reproductive/Obstetrics                             Anesthesia Physical  Anesthesia Plan  ASA: 3  Anesthesia Plan: MAC   Post-op Pain Management:    Induction: Intravenous  PONV Risk Score and Plan: 2 and Propofol infusion, Midazolam and Ondansetron  Airway Management Planned: Natural Airway and Simple Face Mask  Additional Equipment: None  Intra-op Plan:   Post-operative Plan:   Informed Consent: I have reviewed the patients History and Physical, chart, labs and discussed the procedure including the risks, benefits and alternatives for the proposed anesthesia with the patient or authorized representative who has indicated his/her understanding and acceptance.       Plan Discussed with: CRNA and Anesthesiologist  Anesthesia Plan Comments: (HPI: Erika [name] is a 66 y.o. 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  Patient has osteomyelitis before status post right fifth toe amputation.)        Anesthesia Quick Evaluation

## 2022-04-21 NOTE — Op Note (Signed)
   OPERATIVE REPORT Patient name: Erika [name] MRN: [id number] DOB: October 09, 1956  DOS:  04/21/22  Preop Dx: Osteomyelitis, unspecified site, unspecified type (HCC) Postop Dx: same  Procedure:  1. Right partial fourth toe amputation   Surgeon: Louann Sjogren, DPM  Anesthesia: 50-50 mixture of 2% lidocaine plain with 0.5% Marcaine plain totaling 8 infiltrated in the patient's right lower extremity  Hemostasis: No TQ necessary   EBL: <5 mL Materials: None Injectables: as above Pathology: right partial fourth toe, wound swab for culture   Condition: The patient tolerated the procedure and anesthesia well. No complications noted or reported   Justification for procedure: The patient is a 66 y.o. female who presents today for treatment of right fourth digit osteomyelitis.  All conservative modalities of been unsuccessful in providing any sort of satisfactory alleviation of symptoms with the patient. The patient was told benefits as well as possible side effects of the surgery. The patient consented for surgical correction. The patient consent form was reviewed. All patient questions were answered. No guarantees were expressed or implied. The patient and the surgeon both signed the patient consent form with the witness present and placed in the patient's chart.   Procedure in Detail: The patient was brought to the operating room, placed in the operating table in the supine position at which time an aseptic scrub and drape were performed about the patient's respective lower extremity after anesthesia was induced as described above. Attention was then directed to the surgical area where procedure number one commenced.  Procedure #1:  Attention was directed to the right fouth digit were an incision was preformed encircling the PIPJ of the toe. Incision was made down to bone and digit was amputated at the level of the proximal interphalangeal joint. After the toe was disarticulated it was  passed off to the back table to be sent to pathology for further evaluation. The extensor and flexor tendons were identified and resected as far proximally as possible. Any necrotic tissue was removed to healthy bleeding tissue. The proximal phalanx head was identified and noted to be hard. The area was irrigated copiously sterile saline and residual bone swab was sent to microbiology. Any bleeders noted were cauterized as necessary. The skin was re-approximated utilizing 3-0 nylon suture.    Dry sterile compressive dressings were then applied to all previously mentioned incision sites about the patient's lower extremity. The patient was then transferred from the operating room to the recovery room having tolerated the procedure and anesthesia well. All vital signs are stable. After a brief stay in the recovery room the patient was readmitted to the floor.    Disposition:  Patient is ok for discharge from podiatry stand point. Will keep dressing intact until follow-up. Likely will need one week PO antibiotics on discharge. Post-op shoe and heel weight bearing to the right lower extremity. I will follow-up with her in one week in clinic after discharge.    Louann Sjogren, DPM [facility]  Dr. Louann Sjogren, DPM    [address] [place], [place] [address]                Office [phone]  Fax [phone]

## 2022-04-21 NOTE — Brief Op Note (Signed)
04/20/2022 - 04/21/2022  5:13 PM  PATIENT:  Erika [name]  66 y.o. female  PRE-OPERATIVE DIAGNOSIS:  Right Fourth Toe Osteomylitis  POST-OPERATIVE DIAGNOSIS: Same as above  PROCEDURE:  Procedure(s): AMPUTATION RIGHT FOURTH TOE (Right)  SURGEON:  Surgeon(s) and Role:    * Lorenda Peck, DPM - Primary  PHYSICIAN ASSISTANT:   ASSISTANTS: none   ANESTHESIA:   local and MAC  EBL:  <72m   BLOOD ADMINISTERED:none  DRAINS: none   LOCAL MEDICATIONS USED:  MARCAINE   , LIDOCAINE , and Amount: 5 ml  SPECIMEN:  Source of Specimen:  right fourth partial digit pathology and culture of wound swab.   DISPOSITION OF SPECIMEN:   toe for pathology and wound swab for culture   COUNTS:  YES  TOURNIQUET: No TQ  DICTATION: .Note written in EPIC  PLAN OF CARE: Admit to inpatient   PATIENT DISPOSITION:  PACU - hemodynamically stable.   Delay start of Pharmacological VTE agent (>24hrs) due to surgical blood loss or risk of bleeding: no

## 2022-04-21 NOTE — Transfer of Care (Signed)
Immediate Anesthesia Transfer of Care Note  Patient: Erika [name]  Procedure(s) Performed: AMPUTATION RIGHT FOURTH TOE (Right: Toe)  Patient Location: PACU  Anesthesia Type:MAC  Level of Consciousness: awake, alert  and oriented  Airway & Oxygen Therapy: Patient Spontanous Breathing  Post-op Assessment: Report given to RN and Post -op Vital signs reviewed and stable  Post vital signs: Reviewed and stable  Last Vitals:  Vitals Value Taken Time  BP 120/63 04/21/22 1750  Temp    Pulse 56 04/21/22 1751  Resp 11 04/21/22 1751  SpO2 94 % 04/21/22 1751  Vitals shown include unvalidated device data.  Last Pain:  Vitals:   04/21/22 1550  TempSrc:   PainSc: 8       Patients Stated Pain Goal: 3 (76/80/88 1103)  Complications: No notable events documented.

## 2022-04-21 NOTE — ED Notes (Signed)
All belongings sent w/ Pt.  Pt reports nothing of value and she could not send it home w/ family.

## 2022-04-21 NOTE — ED Notes (Signed)
Hospitalist at bedside 

## 2022-04-21 NOTE — Plan of Care (Signed)

## 2022-04-21 NOTE — Progress Notes (Signed)
PROGRESS NOTE    Erika [name]  OZH:[id number] DOB: May 29, 1956 DOA: 04/20/2022 PCP: Marletta Lor, NP    Chief Complaint  Patient presents with   Wound Check    Brief Narrative: Patient is a 66 year old female history of alcohol abuse, pancytopenia secondary to alcohol abuse, rheumatoid arthritis, anxiety/depression presenting with worsening right fourth toe swelling and pain.  Patient noted with prior history of osteomyelitis status post right fifth toe amputation.  Patient noted to have normal ABIs less than a year ago.  Patient seen in the ED, plain films done concerning for osteomyelitis of the fourth distal phalanx.  Podiatry consulted.  Patient to the OR today for possible partial amputation of the fourth digit.   Assessment & Plan:   Principal Problem:   Osteomyelitis (HCC) Active Problems:   Numbness and tingling   Depression   Alcohol use disorder   GERD (gastroesophageal reflux disease)   Pancytopenia (HCC)   Anxiety   #1 right fourth digit/toe osteomyelitis -Patient presenting with right toe swelling and pain which was initially preceded by bleeding approximately a week ago. -Plain films done with erosions and osteomyelitis in the distal phalanx of the fourth digit. -Patient noted to have ABIs done on 07/07/2021 of the right foot and TBI's done which were normal at that time. -Continue empiric IV cefepime, IV Flagyl, IV vancomycin. -Patient seen in consultation by podiatry, Dr. Ralene Cork and patient currently n.p.o. in anticipation of partial amputation of the fourth digit today. -Per podiatry.  2.  Pancytopenia -Felt secondary to alcohol abuse. -Counts stable. -Follow.  3.  Alcohol use -Alcohol cessation stressed to patient. -Patient with no signs or symptoms of withdrawal. -Continue the Ativan withdrawal protocol, thiamine, folic acid, multivitamin.  4.  Depression/anxiety -Continue home regimen Lamictal, Effexor, trazodone. -Resume home regimen  Prozac.  5.  GERD -Place on PPI.    DVT prophylaxis: SCDs.  Postop DVT prophylaxis per podiatry Code Status: Full Family Communication: Updated patient.  No family at bedside. Disposition: TBD.  Status is: Inpatient Remains inpatient appropriate because: Severity of illness, requiring IV antibiotics, to undergo amputation per podiatry.   Consultants:  Podiatry: Dr. Ralene Cork 04/20/2022  Procedures:  Plain films of the right fourth toe 04/20/2022  Antimicrobials:  IV cefepime 04/20/2022>>>> IV Flagyl 04/20/2022>>>> IV vancomycin 04/20/2022>>>>>   Subjective: Laying on gurney in the ED sleeping.  Easily arousable.  No chest pain.  No shortness of breath.  No abdominal pain.  Still with complaints of right fourth toe pain.  Awaiting surgery today.  Objective: Vitals:   04/21/22 0800 04/21/22 0830 04/21/22 0900 04/21/22 0930  BP: 127/70 136/69 (!) 135/116 (!) 131/91  Pulse: (!) 52 (!) 53 (!) 53 (!) 55  Resp: 16 16 16 16   Temp:      TempSrc:      SpO2: 94% 96% 94% 92%    Intake/Output Summary (Last 24 hours) at 04/21/2022 1014 Last data filed at 04/20/2022 2216 Gross per 24 hour  Intake 100 ml  Output --  Net 100 ml   There were no vitals filed for this visit.  Examination:  General exam: Appears calm and comfortable  Respiratory system: Clear to auscultation. Respiratory effort normal. Cardiovascular system: S1 & S2 heard, RRR. No JVD, murmurs, rubs, gallops or clicks. No pedal edema. Gastrointestinal system: Abdomen is nondistended, soft and nontender. No organomegaly or masses felt. Normal bowel sounds heard. Central nervous system: Alert and oriented. No focal neurological deficits. Extremities: Right fourth toe with some erythema, swelling, tender  to palpation. Skin: No rashes, lesions or ulcers Psychiatry: Judgement and insight appear normal. Mood & affect appropriate.     Data Reviewed: I have personally reviewed following labs and imaging  studies  CBC: Recent Labs  Lab 04/20/22 0948 04/21/22 0340  WBC 2.7* 2.1*  NEUTROABS 1.7  --   HGB 11.4* 10.5*  HCT 34.2* 30.6*  MCV 97.4 96.5  PLT 52* 47*    Basic Metabolic Panel: Recent Labs  Lab 04/20/22 0948  NA 140  K 3.6  CL 106  CO2 25  GLUCOSE 141*  BUN 7*  CREATININE 0.85  CALCIUM 8.4*    GFR: CrCl cannot be calculated (Unknown ideal weight.).  Liver Function Tests: Recent Labs  Lab 04/20/22 0948  AST 26  ALT 15  ALKPHOS 136*  BILITOT 1.3*  PROT 6.3*  ALBUMIN 2.8*    CBG: No results for input(s): "GLUCAP" in the last 168 hours.   No results found for this or any previous visit (from the past 240 hour(s)).       Radiology Studies: DG Toe 4th Right  Result Date: 04/20/2022 CLINICAL DATA:  Right fourth toe wound infection. EXAM: RIGHT FOURTH TOE COMPARISON:  Right foot x-rays dated April 24, 2021. FINDINGS: New severe soft tissue swelling of the fourth toe with bony destruction of the fourth distal phalanx. Prior amputation of the fifth toe. No acute fracture or dislocation. IMPRESSION: 1. New severe soft tissue swelling of the fourth toe with osteomyelitis of the fourth distal phalanx. Electronically Signed   By: Obie Dredge M.D.   On: 04/20/2022 12:57        Scheduled Meds:  FLUoxetine  20 mg Oral Daily   folic acid  1 mg Oral Daily   lamoTRIgine  50 mg Oral BID   multivitamin with minerals  1 tablet Oral Daily   pregabalin  300 mg Oral BID   thiamine  100 mg Oral Daily   traZODone  100 mg Oral QHS   venlafaxine XR  150 mg Oral Q breakfast   Continuous Infusions:  ceFEPime (MAXIPIME) IV Stopped (04/21/22 0612)   metronidazole Stopped (04/21/22 0218)   vancomycin       LOS: 1 day    Time spent: 40 minutes    Ramiro Harvest, MD [facility]   To contact the attending provider between 7A-7P or the covering provider during after hours 7P-7A, please log into the web site [email or web address] and access using universal  [place] password for that web site. If you do not have the password, please call the hospital operator.  04/21/2022, 10:14 AM

## 2022-04-21 NOTE — Interval H&P Note (Signed)
History and Physical Interval Note:  04/21/2022 4:59 PM  Erika [name]  has presented today for surgery, with the diagnosis of Right Fourth Toe Osteomylitis.  The various methods of treatment have been discussed with the patient and family. After consideration of risks, benefits and other options for treatment, the patient has consented to  Procedure(s): AMPUTATION RIGHT FOURTH TOE (Right) as a surgical intervention.  The patient's history has been reviewed, patient examined, no change in status, stable for surgery.  I have reviewed the patient's chart and labs.  Questions were answered to the patient's satisfaction.     Louann Sjogren

## 2022-04-22 ENCOUNTER — Encounter (HOSPITAL_COMMUNITY): Payer: Self-pay | Admitting: Podiatry

## 2022-04-22 ENCOUNTER — Other Ambulatory Visit (HOSPITAL_COMMUNITY): Payer: Self-pay

## 2022-04-22 DIAGNOSIS — K219 Gastro-esophageal reflux disease without esophagitis: Secondary | ICD-10-CM

## 2022-04-22 DIAGNOSIS — F329 Major depressive disorder, single episode, unspecified: Secondary | ICD-10-CM

## 2022-04-22 DIAGNOSIS — M869 Osteomyelitis, unspecified: Secondary | ICD-10-CM

## 2022-04-22 LAB — BASIC METABOLIC PANEL
Anion gap: 8 (ref 5–15)
BUN: 9 mg/dL (ref 8–23)
CO2: 21 mmol/L — ABNORMAL LOW (ref 22–32)
Calcium: 8.5 mg/dL — ABNORMAL LOW (ref 8.9–10.3)
Chloride: 110 mmol/L (ref 98–111)
Creatinine, Ser: 0.84 mg/dL (ref 0.44–1.00)
GFR, Estimated: >60 mL/min (ref >60)
Glucose, Bld: 59 mg/dL — ABNORMAL LOW (ref 70–99)
Potassium: 3.7 mmol/L (ref 3.5–5.1)
Sodium: 139 mmol/L (ref 135–145)

## 2022-04-22 LAB — CBC WITH DIFFERENTIAL/PLATELET
Abs Immature Granulocytes: 0.01 10*3/uL (ref 0.00–0.07)
Basophils Absolute: 0 10*3/uL (ref 0.0–0.1)
Basophils Relative: 0 %
Eosinophils Absolute: 0 10*3/uL (ref 0.0–0.5)
Eosinophils Relative: 0 %
HCT: 32.1 % — ABNORMAL LOW (ref 36.0–46.0)
Hemoglobin: 10.8 g/dL — ABNORMAL LOW (ref 12.0–15.0)
Immature Granulocytes: 0 %
Lymphocytes Relative: 28 %
Lymphs Abs: 0.7 10*3/uL (ref 0.7–4.0)
MCH: 33 pg (ref 26.0–34.0)
MCHC: 33.6 g/dL (ref 30.0–36.0)
MCV: 98.2 fL (ref 80.0–100.0)
Monocytes Absolute: 0.3 10*3/uL (ref 0.1–1.0)
Monocytes Relative: 12 %
Neutro Abs: 1.4 10*3/uL — ABNORMAL LOW (ref 1.7–7.7)
Neutrophils Relative %: 60 %
Platelets: 46 10*3/uL — ABNORMAL LOW (ref 150–400)
RBC: 3.27 MIL/uL — ABNORMAL LOW (ref 3.87–5.11)
RDW: 15 % (ref 11.5–15.5)
WBC: 2.4 10*3/uL — ABNORMAL LOW (ref 4.0–10.5)
nRBC: 0 % (ref 0.0–0.2)

## 2022-04-22 LAB — MAGNESIUM: Magnesium: 2.1 mg/dL (ref 1.7–2.4)

## 2022-04-22 MED ORDER — AMOXICILLIN-POT CLAVULANATE 875-125 MG PO TABS
1.0000 | ORAL_TABLET | Freq: Two times a day (BID) | ORAL | Status: DC
  Administered 2022-04-22: 1 via ORAL
  Filled 2022-04-22: qty 1

## 2022-04-22 MED ORDER — DOXYCYCLINE HYCLATE 100 MG PO TABS
100.0000 mg | ORAL_TABLET | Freq: Two times a day (BID) | ORAL | Status: DC
  Administered 2022-04-22: 100 mg via ORAL
  Filled 2022-04-22: qty 1

## 2022-04-22 MED ORDER — LAMOTRIGINE 100 MG PO TABS
100.0000 mg | ORAL_TABLET | Freq: Two times a day (BID) | ORAL | Status: DC
  Administered 2022-04-22: 100 mg via ORAL
  Filled 2022-04-22: qty 1

## 2022-04-22 MED ORDER — OXYCODONE HCL 5 MG PO TABS
5.0000 mg | ORAL_TABLET | ORAL | 0 refills | Status: DC | PRN
  Filled 2022-04-22: qty 15, 3d supply, fill #0

## 2022-04-22 MED ORDER — FOLIC ACID 1 MG PO TABS: 1.0000 mg | ORAL_TABLET | Freq: Every day | ORAL | Status: DC

## 2022-04-22 MED ORDER — AMOXICILLIN-POT CLAVULANATE 875-125 MG PO TABS
1.0000 | ORAL_TABLET | Freq: Two times a day (BID) | ORAL | 0 refills | Status: AC
  Filled 2022-04-22: qty 14, 7d supply, fill #0

## 2022-04-22 MED ORDER — DOXYCYCLINE HYCLATE 100 MG PO TABS
100.0000 mg | ORAL_TABLET | Freq: Two times a day (BID) | ORAL | 0 refills | Status: AC
  Filled 2022-04-22: qty 14, 7d supply, fill #0

## 2022-04-22 MED ORDER — PANTOPRAZOLE SODIUM 40 MG PO TBEC
40.0000 mg | DELAYED_RELEASE_TABLET | Freq: Every day | ORAL | 1 refills | Status: DC
  Filled 2022-04-22: qty 30, 30d supply, fill #0

## 2022-04-22 MED ORDER — ADULT MULTIVITAMIN W/MINERALS CH: 1.0000 | ORAL_TABLET | Freq: Every day | ORAL | Status: DC

## 2022-04-22 MED ORDER — SENNOSIDES-DOCUSATE SODIUM 8.6-50 MG PO TABS: 1.0000 | ORAL_TABLET | Freq: Two times a day (BID) | ORAL | Status: DC

## 2022-04-22 NOTE — Evaluation (Signed)
Physical Therapy Evaluation Patient Details Name: Erika [name] MRN: [id number] DOB: 11/24/1955 Today's Date: 04/22/2022  History of Present Illness  66 y/o female admitted on 04/20/22 for R fourth toe amputation in setting of osteomyelitis. PMH: alcohol abuse, pancytopenia, RA, depression, HTN, GERD.  Clinical Impression  Patient admitted with the above. PTA, patient lives with daughter and independent. Patient presents with weakness, decreased activity tolerance, impaired balance, and decreased knowledge of precautions. Educated patient on WB status and post op shoe orders, however patient refuses post op shoes stating "it didn't work for her last time". Patient requires supervision for mobility with use of RW and cues for maintaining heel WB. Discussed stair negotiation techniques as patient denies stairs to access home but reports fall while negotiating stairs to get into her house. Patient will benefit from skilled PT services during acute stay to address listed deficits. No PT follow up recommended at this time.        Recommendations for follow up therapy are one component of a multi-disciplinary discharge planning process, led by the attending physician.  Recommendations may be updated based on patient status, additional functional criteria and insurance authorization.  Follow Up Recommendations No PT follow up      Assistance Recommended at Discharge PRN  Patient can return home with the following       Equipment Recommendations Rolling Jacey Pelc (2 wheels)  Recommendations for Other Services       Functional Status Assessment Patient has had a recent decline in their functional status and demonstrates the ability to make significant improvements in function in a reasonable and predictable amount of time.     Precautions / Restrictions Precautions Precautions: Fall Required Braces or Orthoses: Other Brace Other Brace: per order, patient to use post op shoe for mobility,  however patient refused shoe from [employer] Restrictions Weight Bearing Restrictions: Yes RLE Weight Bearing: Partial weight bearing Other Position/Activity Restrictions: WB thru the heel      Mobility  Bed Mobility Overal bed mobility: Modified Independent                  Transfers Overall transfer level: Needs assistance Equipment used: Rolling Letroy Vazguez (2 wheels) Transfers: Sit to/from Stand Sit to Stand: Supervision           General transfer comment: cues for hand placement. Supervision for safety    Ambulation/Gait Ambulation/Gait assistance: Supervision Gait Distance (Feet): 75 Feet Assistive device: Rolling Todd Argabright (2 wheels) Gait Pattern/deviations: Step-to pattern, Decreased stride length, Decreased stance time - right Gait velocity: decreased     General Gait Details: supervision for safety. Cues for heel WB on R  Stairs Stairs:  (Discussed safe stair negotiation with up with good LE (L) and down with the bad (R) if patient were to encounter stairs)          Wheelchair Mobility    Modified Rankin (Stroke Patients Only)       Balance Overall balance assessment: Needs assistance Sitting-balance support: No upper extremity supported, Feet supported Sitting balance-Leahy Scale: Fair     Standing balance support: No upper extremity supported, Bilateral upper extremity supported, During functional activity Standing balance-Leahy Scale: Fair                               Pertinent Vitals/Pain Pain Assessment Pain Assessment: 0-10 Pain Score: 7  Pain Location: R foot Pain Descriptors / Indicators: Grimacing, Guarding, Sore Pain Intervention(s): Monitored during session,  Repositioned    Home Living Family/patient expects to be discharged to:: Private residence Living Arrangements: Children Available Help at Discharge: Family;Available 24 hours/day Type of Home: House Home Access: Level entry   Entrance Stairs-Number of  Steps: 3   Home Layout: One level Home Equipment: Cane - single point Additional Comments: per pt, daughter not currently working and home to assist as needed    Prior Function Prior Level of Function : Needs assist       Physical Assist : ADLs (physical)   ADLs (physical): IADLs Mobility Comments: intermittent cane use, endorses one recent fall coming up the steps to her home but denies stairs into home ADLs Comments: Independent with ADLs, does basic kitchen tasks. daughter assists with heavier IADLs and driving     Hand Dominance   Dominant Hand: Right    Extremity/Trunk Assessment   Upper Extremity Assessment Upper Extremity Assessment: Defer to OT evaluation    Lower Extremity Assessment Lower Extremity Assessment: RLE deficits/detail RLE Deficits / Details: s/p R 4th toe amputation    Cervical / Trunk Assessment Cervical / Trunk Assessment: Normal  Communication   Communication: No difficulties  Cognition Arousal/Alertness: Awake/alert Behavior During Therapy: Impulsive Overall Cognitive Status: Within Functional Limits for tasks assessed                                 General Comments: pleasant, participatory though impulsive and quick with movements        General Comments General comments (skin integrity, edema, etc.): Educated patient on WB status and post op shoe orders, however patient refuses post op shoe stating "it didn't work for me last time, it kept falling off"    Exercises     Assessment/Plan    PT Assessment Patient needs continued PT services  PT Problem List Decreased strength;Decreased activity tolerance;Decreased balance;Decreased knowledge of precautions;Decreased knowledge of use of DME;Decreased safety awareness       PT Treatment Interventions DME instruction;Gait training;Functional mobility training;Therapeutic activities;Therapeutic exercise;Balance training;Patient/family education    PT Goals (Current goals  can be found in the Care Plan section)  Acute Rehab PT Goals Patient Stated Goal: to go home today PT Goal Formulation: With patient Time For Goal Achievement: 05/06/22 Potential to Achieve Goals: Good    Frequency Min 3X/week     Co-evaluation               AM-PAC PT "6 Clicks" Mobility  Outcome Measure Help needed turning from your back to your side while in a flat bed without using bedrails?: None Help needed moving from lying on your back to sitting on the side of a flat bed without using bedrails?: None Help needed moving to and from a bed to a chair (including a wheelchair)?: A Little Help needed standing up from a chair using your arms (e.g., wheelchair or bedside chair)?: A Little Help needed to walk in hospital room?: A Little Help needed climbing 3-5 steps with a railing? : A Little 6 Click Score: 20    End of Session   Activity Tolerance: Patient tolerated treatment well Patient left: in bed;with call bell/phone within reach;with bed alarm set Nurse Communication: Mobility status PT Visit Diagnosis: Muscle weakness (generalized) (M62.81);Other abnormalities of gait and mobility (R26.89)    Time: 5462-7035 PT Time Calculation (min) (ACUTE ONLY): 12 min   Charges:   PT Evaluation $PT Eval Low Complexity: 1 Low  Ziasia Lenoir A. Dan Humphreys PT, DPT Acute Rehabilitation Services Office [phone]   Viviann Spare 04/22/2022, 8:46 AM

## 2022-04-22 NOTE — Progress Notes (Signed)
Orthopedic Tech Progress Note Patient Details:  Erika [name] 01-Apr-1956 [id number] Told RN patient refused post op shoe Patient ID: [name], female   DOB: Mar 04, 1956, 66 y.o.   MRN: [id number]  Lovett Calender 04/22/2022, 8:12 AM

## 2022-04-22 NOTE — TOC CAGE-AID Note (Signed)
Transition of Care [facility]) - CAGE-AID Screening   Patient Details  Name: KIMYETTA FLOTT MRN: [id number] Date of Birth: 01/24/1956  [facility]) CM/SW Contact:    Joanne Chars, LCSW Phone Number: 04/22/2022, 9:47 AM   Clinical Narrative: CSW met with pt to complete cage aid.  Pt reports she drinks one glass of wine approx 3x week.  Pt also reports marijuana use but reports it is less than once per week.  Pt has never been in substance use treatment.  Pt does want to quit, does not want to pursue treatment currently, thinks she can do this on her own.  Discussed that she could seek treatment if she is unsuccessful in quitting on her own.  Pt verbalized understanding, treatment provider resource list provided.      CAGE-AID Screening:    Have You Ever Felt You Ought to Cut Down on Your Drinking or Drug Use?: Yes Have People Annoyed You By Critizing Your Drinking Or Drug Use?: No Have You Felt Bad Or Guilty About Your Drinking Or Drug Use?: No Have You Ever Had a Drink or Used Drugs First Thing In The Morning to Steady Your Nerves or to Get Rid of a Hangover?: No CAGE-AID Score: 1  Substance Abuse Education Offered: Yes  Substance abuse interventions: Other (must comment) (treatment provider resourcelist)

## 2022-04-22 NOTE — Plan of Care (Signed)

## 2022-04-22 NOTE — Progress Notes (Signed)
Discharge summary packet provided to pt with instructions Pt verbalized understanding of instructions. No complaints.Pt d/c to home as ordered. Prescribed meds from [facility] pharmacy provided.  Pt has no complaints. Per pt her daughter will provide the transportation.

## 2022-04-22 NOTE — Evaluation (Addendum)
Occupational Therapy Evaluation Patient Details Name: Erika [name] MRN: [id number] DOB: [birth year] Today's Date: 04/22/2022   History of Present Illness Pt is a 66 y/o female admitted for R fourth toe amputation in setting of osteomyelitis. PMH: alcohol abuse, pancytopenia, RA, depression, HTN, GERD.   Clinical Impression   PTA, pt lives with daughter, typically Modified Independent with ADLs, some IADLs and mobility with intermittent cane use. Pt endorses one recent fall involving stairs entering home. Pt presents now with expected post op pain but moving fairly well despite pain and able to maintain WB thru heel well. Pt does demonstrate impulsivity with OOB activities requiring cues for pacing, precautions, and DME use. Pt overall Supervision for ADLs/mobility d/t impulsivity. Anticipate no OT needs at DC with family to assist with IADLs as needed at home. Will follow acutely for continued fall prevention education and to maximize ADL independence.      Recommendations for follow up therapy are one component of a multi-disciplinary discharge planning process, led by the attending physician.  Recommendations may be updated based on patient status, additional functional criteria and insurance authorization.   Follow Up Recommendations  No OT follow up    Assistance Recommended at Discharge Set up Supervision/Assistance  Patient can return home with the following Assistance with cooking/housework;Assist for transportation;Help with stairs or ramp for entrance;A little help with bathing/dressing/bathroom    Functional Status Assessment  Patient has had a recent decline in their functional status and demonstrates the ability to make significant improvements in function in a reasonable and predictable amount of time.  Equipment Recommendations  None recommended by OT    Recommendations for Other Services       Precautions / Restrictions Precautions Precautions: Fall Required Braces  or Orthoses: Other Brace Other Brace: per order set on 6/21, pt to have post op shoe though no present in room on 6/22 - contacted ortho tech for update Restrictions Weight Bearing Restrictions: No RLE Weight Bearing: Partial weight bearing Other Position/Activity Restrictions: WB thru the heel      Mobility Bed Mobility Overal bed mobility: Modified Independent                  Transfers Overall transfer level: Needs assistance Equipment used: Rolling walker (2 wheels), None Transfers: Sit to/from Stand Sit to Stand: Supervision           General transfer comment: initially no use of AD, able to stand and adhere to heel WB though unsteady initially. stood from toilet with RW without LOB      Balance Overall balance assessment: Needs assistance Sitting-balance support: No upper extremity supported, Feet supported Sitting balance-Leahy Scale: Fair     Standing balance support: No upper extremity supported, Bilateral upper extremity supported, During functional activity Standing balance-Leahy Scale: Fair                             ADL either performed or assessed with clinical judgement   ADL Overall ADL's : Needs assistance/impaired Eating/Feeding: Independent   Grooming: Supervision/safety;Standing;Wash/dry hands   Upper Body Bathing: Set up;Sitting;Standing   Lower Body Bathing: Supervison/ safety;Sitting/lateral leans;Sit to/from stand   Upper Body Dressing : Set up;Sitting   Lower Body Dressing: Supervision/safety;Sitting/lateral leans;Sit to/from stand   Toilet Transfer: Supervision/safety;Ambulation;Comfort height toilet Toilet Transfer Details (indicate cue type and reason): pt reporting urinary urgency, no post op shoe in room and pt able to demo heel WB adequately. Reports no need  for RW for mobility though unsteadiness noted with impulsive mobility to bathroom. On return from toilet, provided RW for pt to trial with consistent cues  needed for RW mgmt Toileting- Clothing Manipulation and Hygiene: Supervision/safety;Sit to/from stand;Sitting/lateral lean Toileting - Clothing Manipulation Details (indicate cue type and reason): no physical assist needed, supervision for safety due to lines and impulsivity     Functional mobility during ADLs: Supervision/safety;Rolling walker (2 wheels);Cueing for sequencing;Cueing for safety General ADL Comments: Impulsive with movements though endorses this is her 3rd amputation and has been able to manage well at home on previous occassions. Pt endorses having a post op shoe at home though did not use consistently. Educated re: fall prevention strategies for ADLs/IADLs     Vision Baseline Vision/History: 1 Wears glasses Ability to See in Adequate Light: 0 Adequate Patient Visual Report: No change from baseline Vision Assessment?: No apparent visual deficits     Perception     Praxis      Pertinent Vitals/Pain Pain Assessment Pain Assessment: 0-10 Pain Score: 8  Pain Location: R foot Pain Descriptors / Indicators: Grimacing, Guarding, Sore Pain Intervention(s): Monitored during session     Hand Dominance Right   Extremity/Trunk Assessment Upper Extremity Assessment Upper Extremity Assessment: Overall WFL for tasks assessed   Lower Extremity Assessment Lower Extremity Assessment: Defer to PT evaluation   Cervical / Trunk Assessment Cervical / Trunk Assessment: Normal   Communication Communication Communication: No difficulties   Cognition Arousal/Alertness: Awake/alert Behavior During Therapy: WFL for tasks assessed/performed, Anxious, Impulsive Overall Cognitive Status: Within Functional Limits for tasks assessed                                 General Comments: pleasant, participatory though impulsive and quick with movements     General Comments  Noted small blood amounts on sheet with pt reporting it was from IV overnight (noted IVs on tray  table). Educated pt on post op shoe orders, WB status - called ortho tech about post op shoe as orders placed the day prior    Exercises     Shoulder Instructions      Home Living Family/patient expects to be discharged to:: Private residence Living Arrangements: Children Available Help at Discharge: Family;Available 24 hours/day Type of Home: House Home Access: Stairs to enter [employer] of Steps: 3   Home Layout: One level     Bathroom Shower/Tub: Producer, television/film/video: Standard     Home Equipment: Cane - single point   Additional Comments: per pt, daughter not currently working and home to assist as needed      Prior Functioning/Environment Prior Level of Function : Needs assist       Physical Assist : ADLs (physical)   ADLs (physical): IADLs Mobility Comments: intermittent cane use, endorses one recent fall coming up the steps to her home ADLs Comments: Independent with ADLs, does basic kitchen tasks. daughter assists with heavier IADLs and driving        OT Problem List: Decreased activity tolerance;Impaired balance (sitting and/or standing);Decreased safety awareness;Decreased knowledge of use of DME or AE;Pain      OT Treatment/Interventions: Self-care/ADL training;Therapeutic exercise;DME and/or AE instruction;Therapeutic activities;Patient/family education;Balance training    OT Goals(Current goals can be found in the care plan section) Acute Rehab OT Goals Patient Stated Goal: go home today OT Goal Formulation: With patient Time For Goal Achievement: 05/06/22 Potential to Achieve Goals: Good  ADL Goals Pt Will Perform Lower Body Bathing: with modified independence;sit to/from stand Pt Will Transfer to Toilet: with modified independence;ambulating Additional ADL Goal #1: Pt to verbalize at least 3 fall prevention strategies to implement in home environment  OT Frequency: Min 2X/week    Co-evaluation              AM-PAC  OT "6 Clicks" Daily Activity     Outcome Measure Help from another person eating meals?: None Help from another person taking care of personal grooming?: A Little Help from another person toileting, which includes using toliet, bedpan, or urinal?: A Little Help from another person bathing (including washing, rinsing, drying)?: A Little Help from another person to put on and taking off regular upper body clothing?: A Little Help from another person to put on and taking off regular lower body clothing?: A Little 6 Click Score: 19   End of Session Equipment Utilized During Treatment: Rolling walker (2 wheels) Nurse Communication: Mobility status  Activity Tolerance: Patient tolerated treatment well Patient left: in bed;with call bell/phone within reach;with bed alarm set  OT Visit Diagnosis: Unsteadiness on feet (R26.81);Other abnormalities of gait and mobility (R26.89)                Time: [id number] OT Time Calculation (min): 14 min Charges:  OT General Charges $OT Visit: 1 Visit OT Evaluation $OT Eval Low Complexity: 1 Low  Malachy Chamber, OTR/L Acute Rehab Services Office: [phone]   Layla Maw 04/22/2022, 8:02 AM

## 2022-04-23 ENCOUNTER — Telehealth: Payer: Self-pay | Admitting: Podiatry

## 2022-04-23 LAB — SURGICAL PATHOLOGY

## 2022-04-23 NOTE — Telephone Encounter (Signed)
Called pt per Dr Ralene Cork to get her in next week for a post op but her phone just rings..  Only available opening as of now id in [place] 6.30 @ 145.

## 2022-04-26 LAB — AEROBIC/ANAEROBIC CULTURE W GRAM STAIN (SURGICAL/DEEP WOUND)
Culture: NO GROWTH
Gram Stain: NONE SEEN

## 2022-04-27 ENCOUNTER — Telehealth: Payer: Self-pay | Admitting: Podiatry

## 2022-04-27 ENCOUNTER — Other Ambulatory Visit: Payer: Self-pay | Admitting: Podiatry

## 2022-04-27 MED ORDER — OXYCODONE HCL 5 MG PO TABS: 5.0000 mg | ORAL_TABLET | ORAL | 0 refills | Status: AC | PRN

## 2022-05-05 ENCOUNTER — Ambulatory Visit: Payer: Medicare Other | Admitting: Podiatry

## 2022-05-05 ENCOUNTER — Telehealth: Payer: Self-pay | Admitting: Podiatry

## 2022-05-05 MED ORDER — TRAMADOL HCL 50 MG PO TABS: 50.0000 mg | ORAL_TABLET | Freq: Four times a day (QID) | ORAL | 0 refills | Status: AC | PRN

## 2022-05-05 NOTE — Telephone Encounter (Signed)
Pt came in 2 minutes late for her appt today so they have rescheduled it. But pt is asking for a pain medication refill to be sent in please.  The pharnacy is correct in chart as [facility] in [place]

## 2022-05-05 NOTE — Telephone Encounter (Signed)
Pt called again stating now that the stitches are coming out and she is having pain. I did explain that we had sent the message but the provider is in clinic but will get to it as soon as possible. Her next appt is 7.17 should she wait since stitches are coming out. Amputation was done 6.21.2023.

## 2022-05-06 NOTE — Telephone Encounter (Signed)
No. Have her scheduled for a nurse visit

## 2022-05-06 NOTE — Telephone Encounter (Signed)
Called pt and it just rings. Need to move appt to nurse schedule for a sooner appt she is a post op pt of Dr Ralene Cork

## 2022-05-07 NOTE — Telephone Encounter (Signed)
Called pt to move appt to sooner. She said she was going to go to the hospital as her stiches are coming out. I asked if she could come in today if I could get her an appt and she said no. I have her scheduled on the nurse schedule per Dr Lilian Kapur for next Wednesday 7.12.2023.[name]

## 2022-05-09 ENCOUNTER — Emergency Department (HOSPITAL_COMMUNITY): Payer: Medicare Other

## 2022-05-09 ENCOUNTER — Emergency Department (HOSPITAL_COMMUNITY)
Admission: EM | Admit: 2022-05-09 | Discharge: 2022-05-10 | Disposition: A | Discharge status: Home / Self Care | Payer: Medicare Other | Attending: Emergency Medicine | Admitting: Emergency Medicine

## 2022-05-09 ENCOUNTER — Other Ambulatory Visit: Payer: Self-pay

## 2022-05-09 ENCOUNTER — Encounter (HOSPITAL_COMMUNITY): Payer: Self-pay

## 2022-05-09 DIAGNOSIS — M7989 Other specified soft tissue disorders: Secondary | ICD-10-CM | POA: Insufficient documentation

## 2022-05-09 DIAGNOSIS — R6883 Chills (without fever): Secondary | ICD-10-CM | POA: Diagnosis not present

## 2022-05-09 DIAGNOSIS — M79671 Pain in right foot: Secondary | ICD-10-CM | POA: Insufficient documentation

## 2022-05-09 DIAGNOSIS — Z5321 Procedure and treatment not carried out due to patient leaving prior to being seen by health care provider: Secondary | ICD-10-CM | POA: Diagnosis not present

## 2022-05-09 LAB — CBC WITH DIFFERENTIAL/PLATELET
Abs Immature Granulocytes: 0.01 10*3/uL (ref 0.00–0.07)
Basophils Absolute: 0 10*3/uL (ref 0.0–0.1)
Basophils Relative: 0 %
Eosinophils Absolute: 0 10*3/uL (ref 0.0–0.5)
Eosinophils Relative: 0 %
HCT: 37.8 % (ref 36.0–46.0)
Hemoglobin: 12.5 g/dL (ref 12.0–15.0)
Immature Granulocytes: 0 %
Lymphocytes Relative: 28 %
Lymphs Abs: 0.8 10*3/uL (ref 0.7–4.0)
MCH: 31.7 pg (ref 26.0–34.0)
MCHC: 33.1 g/dL (ref 30.0–36.0)
MCV: 95.9 fL (ref 80.0–100.0)
Monocytes Absolute: 0.3 10*3/uL (ref 0.1–1.0)
Monocytes Relative: 9 %
Neutro Abs: 1.8 10*3/uL (ref 1.7–7.7)
Neutrophils Relative %: 63 %
Platelets: 50 10*3/uL — ABNORMAL LOW (ref 150–400)
RBC: 3.94 MIL/uL (ref 3.87–5.11)
RDW: 13.8 % (ref 11.5–15.5)
WBC: 2.9 10*3/uL — ABNORMAL LOW (ref 4.0–10.5)
nRBC: 0 % (ref 0.0–0.2)

## 2022-05-09 LAB — COMPREHENSIVE METABOLIC PANEL
ALT: 17 U/L (ref 0–44)
AST: 34 U/L (ref 15–41)
Albumin: 2.9 g/dL — ABNORMAL LOW (ref 3.5–5.0)
Alkaline Phosphatase: 151 U/L — ABNORMAL HIGH (ref 38–126)
Anion gap: 8 (ref 5–15)
BUN: 6 mg/dL — ABNORMAL LOW (ref 8–23)
CO2: 25 mmol/L (ref 22–32)
Calcium: 8.6 mg/dL — ABNORMAL LOW (ref 8.9–10.3)
Chloride: 107 mmol/L (ref 98–111)
Creatinine, Ser: 0.79 mg/dL (ref 0.44–1.00)
GFR, Estimated: >60 mL/min (ref >60)
Glucose, Bld: 94 mg/dL (ref 70–99)
Potassium: 3.8 mmol/L (ref 3.5–5.1)
Sodium: 140 mmol/L (ref 135–145)
Total Bilirubin: 1.6 mg/dL — ABNORMAL HIGH (ref 0.3–1.2)
Total Protein: 6.8 g/dL (ref 6.5–8.1)

## 2022-05-09 LAB — LACTIC ACID, PLASMA: Lactic Acid, Venous: 1.3 mmol/L (ref 0.5–1.9)

## 2022-05-09 NOTE — ED Provider Triage Note (Signed)
Emergency Medicine Provider Triage Evaluation Note  Erika [name] , a 66 y.o. female  was evaluated in triage.  Pt complains of right foot pain status post partial toe amputation.  Patient complains of worsening pain, dehiscence.  No fever or chills.  Review of Systems  Positive: Toe pain Negative: Fever  Physical Exam  BP (!) 139/99 (BP Location: Left Arm)   Pulse 68   Temp 98.2 F (36.8 C)   Resp 18   SpO2 94%  Gen:   Awake, no distress   Resp:  Normal effort  MSK:   Moves extremities without difficulty  Other:  Crusting, open wound, no obvious significant erythema     Medical Decision Making  Medically screening exam initiated at 12:01 PM.  Appropriate orders placed.  [name] was informed that the remainder of the evaluation will be completed by another provider, this initial triage assessment does not replace that evaluation, and the importance of remaining in the ED until their evaluation is complete.  Work-up initiated   Arthor Captain, PA-C 05/09/22 1202

## 2022-05-09 NOTE — ED Notes (Signed)
Patient states the wait is too long and is leaving 

## 2022-05-09 NOTE — ED Triage Notes (Signed)
Patient here with right foot toe infection with increased pain  x 3 days. Had amputation she thinks 3 weeks ago. Chills with no fever

## 2022-05-10 ENCOUNTER — Telehealth: Payer: Self-pay | Admitting: Podiatry

## 2022-05-10 MED ORDER — OXYCODONE-ACETAMINOPHEN 5-325 MG PO TABS: 1.0000 | ORAL_TABLET | Freq: Four times a day (QID) | ORAL | 0 refills | Status: DC | PRN

## 2022-05-10 NOTE — Telephone Encounter (Signed)
Patient said that she was in hospital again yesterday because of her foot,  and she said the pain medication is not working that you gave her, can you send something else in ?

## 2022-05-10 NOTE — Telephone Encounter (Signed)
Left message with patient that medicaton was called in

## 2022-05-11 MED ORDER — OXYCODONE-ACETAMINOPHEN 5-325 MG PO TABS: 1.0000 | ORAL_TABLET | Freq: Four times a day (QID) | ORAL | 0 refills | Status: DC | PRN

## 2022-05-12 ENCOUNTER — Ambulatory Visit (INDEPENDENT_AMBULATORY_CARE_PROVIDER_SITE_OTHER): Payer: Medicare Other | Admitting: Podiatry

## 2022-05-12 ENCOUNTER — Encounter: Payer: Self-pay | Admitting: Podiatry

## 2022-05-12 DIAGNOSIS — I739 Peripheral vascular disease, unspecified: Secondary | ICD-10-CM

## 2022-05-12 DIAGNOSIS — Z9889 Other specified postprocedural states: Secondary | ICD-10-CM

## 2022-05-12 MED ORDER — OXYCODONE-ACETAMINOPHEN 5-325 MG PO TABS: 1.0000 | ORAL_TABLET | Freq: Four times a day (QID) | ORAL | 0 refills | Status: AC | PRN

## 2022-05-12 NOTE — Progress Notes (Signed)
Subjective:  Patient ID: Erika [name], female    DOB: 1956/05/07,  MRN: [id number]  Chief Complaint  Patient presents with   Routine Post Op    [facility] follow-up POV #1 DOS 04/21/22 Right 4th toe amputatation    DOS: 04/21/22  Procedure: Right fourth partial digit amputation.   66 y.o. female returns for POV#1. Relates she has been dealing with a lot of pain and relates stiches have come out and has been dressing daily with betadine. Requesting pain medication.   Review of Systems: Negative except as noted in the HPI. Denies N/V/F/Ch.  Past Medical History:  Diagnosis Date   Arthritis    Depression    GERD (gastroesophageal reflux disease)    History of shingles 10/18/2018   Hypertension    Rheumatoid arthritis(714.0)     Current Outpatient Medications:    FLUoxetine (PROZAC) 20 MG capsule, Take 20 mg by mouth daily., Disp: , Rfl:    folic acid (FOLVITE) 1 MG tablet, Take 1 tablet (1 mg total) by mouth daily., Disp: , Rfl:    lamoTRIgine (LAMICTAL) 100 MG tablet, Take 100 mg by mouth 2 (two) times daily., Disp: , Rfl:    Multiple Vitamin (MULTIVITAMIN WITH MINERALS) TABS tablet, Take 1 tablet by mouth daily., Disp: , Rfl:    oxyCODONE-acetaminophen (PERCOCET/ROXICET) 5-325 MG tablet, Take 1 tablet by mouth every 6 (six) hours as needed for up to 5 days for severe pain., Disp: 20 tablet, Rfl: 0   pantoprazole (PROTONIX) 40 MG tablet, Take 1 tablet (40 mg total) by mouth daily at 6 (six) AM., Disp: 30 tablet, Rfl: 1   pregabalin (LYRICA) 300 MG capsule, Take 300 mg by mouth 2 (two) times daily., Disp: , Rfl:    senna-docusate (SENOKOT S) 8.6-50 MG tablet, Take 1 tablet by mouth 2 (two) times daily., Disp: , Rfl:    thiamine 100 MG tablet, Take 1 tablet (100 mg total) by mouth daily., Disp: 30 tablet, Rfl: 0   traZODone (DESYREL) 100 MG tablet, Take 100 mg by mouth at bedtime as needed for sleep., Disp: , Rfl:    venlafaxine XR (EFFEXOR-XR) 150 MG 24 hr capsule, Take 150 mg by  mouth daily with breakfast., Disp: , Rfl:   Social History   Tobacco Use  Smoking Status Every Day   Packs/day: 0.50   Types: Cigarettes  Smokeless Tobacco Current    No Known Allergies Objective:  There were no vitals filed for this visit. There is no height or weight on file to calculate BMI. Constitutional Well developed. Well nourished.  Vascular Foot warm and well perfused. Capillary refill normal to all digits.   Neurologic Normal speech. Oriented to person, place, and time. Epicritic sensation to light touch grossly present bilaterally.  Dermatologic Skin healing well without signs of infection. Lateral aspect of incision dehisced with about 1 cm wound to lateral fourth remaining digit. No erythema or edema noted.   Orthopedic: Tenderness to palpation noted about the surgical site.   Radiographs: Interval amputation of right fourth digit. No changes noted from hosptial. Reviewed from urgent care visit.  Assessment:   1. Post-operative state   2. Peripheral arterial disease (HCC)    Plan:  Patient was evaluated and treated and all questions answered.  S/p foot surgery right -Progressing as expected post-operatively. -WB Status: WBAT in surgical shoe -Sutures: Some sutures removed and wound cleaned and dressed with betadine. . -Medications: Percocet 5-325 mg sent to pharmacy.  -Foot redressed.  Return in about  2 weeks (around 05/26/2022) for post op.

## 2022-05-13 ENCOUNTER — Telehealth: Payer: Self-pay | Admitting: *Deleted

## 2022-05-14 LAB — CULTURE, BLOOD (ROUTINE X 2)
Culture: NO GROWTH
Special Requests: ADEQUATE

## 2022-05-17 ENCOUNTER — Ambulatory Visit: Payer: Medicare Other | Admitting: Podiatry

## 2022-05-26 ENCOUNTER — Ambulatory Visit (INDEPENDENT_AMBULATORY_CARE_PROVIDER_SITE_OTHER): Payer: Medicare Other | Admitting: Podiatry

## 2022-05-26 ENCOUNTER — Encounter: Payer: Self-pay | Admitting: Podiatry

## 2022-05-26 DIAGNOSIS — I739 Peripheral vascular disease, unspecified: Secondary | ICD-10-CM

## 2022-05-26 DIAGNOSIS — Z9889 Other specified postprocedural states: Secondary | ICD-10-CM | POA: Diagnosis not present

## 2022-05-26 MED ORDER — OXYCODONE-ACETAMINOPHEN 10-325 MG PO TABS: 1.0000 | ORAL_TABLET | Freq: Three times a day (TID) | ORAL | 0 refills | Status: AC | PRN

## 2022-05-26 NOTE — Progress Notes (Signed)
Subjective:  Patient ID: Erika [name], female    DOB: Jan 11, 1956,  MRN: [id number]  Chief Complaint  Patient presents with   Routine Post Op    pov 2 dos 6.21.23.. right 4th toe amputation - Patient states toe looks the same since the last visit , pain medication is making her sick to her stomach. Wound is open with drainage .    DOS: 04/21/22  Procedure: Right fourth partial digit amputation.   66 y.o. female returns for POV#2. Relates she is doing a bit better. Still having a lot of pain. Has been dressing the wound area.   Review of Systems: Negative except as noted in the HPI. Denies N/V/F/Ch.  Past Medical History:  Diagnosis Date   Arthritis    Depression    GERD (gastroesophageal reflux disease)    History of shingles 10/18/2018   Hypertension    Rheumatoid arthritis(714.0)     Current Outpatient Medications:    oxyCODONE-acetaminophen (PERCOCET) 10-325 MG tablet, Take 1 tablet by mouth every 8 (eight) hours as needed for up to 5 days for pain., Disp: 15 tablet, Rfl: 0   FLUoxetine (PROZAC) 20 MG capsule, Take 20 mg by mouth daily., Disp: , Rfl:    folic acid (FOLVITE) 1 MG tablet, Take 1 tablet (1 mg total) by mouth daily., Disp: , Rfl:    lamoTRIgine (LAMICTAL) 100 MG tablet, Take 100 mg by mouth 2 (two) times daily., Disp: , Rfl:    Multiple Vitamin (MULTIVITAMIN WITH MINERALS) TABS tablet, Take 1 tablet by mouth daily., Disp: , Rfl:    pantoprazole (PROTONIX) 40 MG tablet, Take 1 tablet (40 mg total) by mouth daily at 6 (six) AM., Disp: 30 tablet, Rfl: 1   pregabalin (LYRICA) 300 MG capsule, Take 300 mg by mouth 2 (two) times daily., Disp: , Rfl:    senna-docusate (SENOKOT S) 8.6-50 MG tablet, Take 1 tablet by mouth 2 (two) times daily., Disp: , Rfl:    thiamine 100 MG tablet, Take 1 tablet (100 mg total) by mouth daily., Disp: 30 tablet, Rfl: 0   traZODone (DESYREL) 100 MG tablet, Take 100 mg by mouth at bedtime as needed for sleep., Disp: , Rfl:    venlafaxine  XR (EFFEXOR-XR) 150 MG 24 hr capsule, Take 150 mg by mouth daily with breakfast., Disp: , Rfl:   Social History   Tobacco Use  Smoking Status Every Day   Packs/day: 0.50   Types: Cigarettes  Smokeless Tobacco Current    No Known Allergies Objective:  There were no vitals filed for this visit. There is no height or weight on file to calculate BMI. Constitutional Well developed. Well nourished.  Vascular Foot warm and well perfused. Capillary refill normal to all digits.   Neurologic Normal speech. Oriented to person, place, and time. Epicritic sensation to light touch grossly present bilaterally.  Dermatologic Skin healing well without signs of infection. Lateral aspect of incision dehisced with about 1 cm x 0.5 cm  wound to lateral fourth remaining digit. No erythema or edema noted.   Orthopedic: Tenderness to palpation noted about the surgical site.   Radiographs: Interval amputation of right fourth digit. No changes noted from hosptial. Reviewed from urgent care visit.  Assessment:   1. Post-operative state   2. Peripheral arterial disease (HCC)     Plan:  Patient was evaluated and treated and all questions answered.  S/p foot surgery right -Progressing as expected post-operatively. -WB Status: WBAT in surgical shoe. Post -op shoe dispensed.  -  Sutures: Some sutures removed and wound cleaned and dressed with betadine. . -Medications: Percocet 10-325 mg sent to pharmacy.  -Foot redressed. Follow-up in 2 weeks for wound check.   Return in about 2 weeks (around 06/09/2022) for post op.

## 2022-06-09 ENCOUNTER — Ambulatory Visit (INDEPENDENT_AMBULATORY_CARE_PROVIDER_SITE_OTHER): Payer: Medicare Other | Admitting: Podiatry

## 2022-06-09 ENCOUNTER — Encounter: Payer: Self-pay | Admitting: Podiatry

## 2022-06-09 DIAGNOSIS — I739 Peripheral vascular disease, unspecified: Secondary | ICD-10-CM

## 2022-06-09 DIAGNOSIS — Z9889 Other specified postprocedural states: Secondary | ICD-10-CM

## 2022-06-09 DIAGNOSIS — Z89412 Acquired absence of left great toe: Secondary | ICD-10-CM

## 2022-06-09 MED ORDER — OXYCODONE-ACETAMINOPHEN 10-325 MG PO TABS: 1.0000 | ORAL_TABLET | Freq: Three times a day (TID) | ORAL | 0 refills | Status: AC | PRN

## 2022-06-09 NOTE — Progress Notes (Signed)
  Subjective:  Patient ID: Erika [name], female    DOB: 1956/08/02,  MRN: [id number]  No chief complaint on file.   DOS: 04/21/22  Procedure: Right fourth partial digit amputation.   66 y.o. female returns for POV#3. Relates she is doing a bit better. Still having a lot of pain. Has been dressing the wound area.   Review of Systems: Negative except as noted in the HPI. Denies N/V/F/Ch.  Past Medical History:  Diagnosis Date   Arthritis    Depression    GERD (gastroesophageal reflux disease)    History of shingles 10/18/2018   Hypertension    Rheumatoid arthritis(714.0)     Current Outpatient Medications:    FLUoxetine (PROZAC) 20 MG capsule, Take 20 mg by mouth daily., Disp: , Rfl:    folic acid (FOLVITE) 1 MG tablet, Take 1 tablet (1 mg total) by mouth daily., Disp: , Rfl:    lamoTRIgine (LAMICTAL) 100 MG tablet, Take 100 mg by mouth 2 (two) times daily., Disp: , Rfl:    Multiple Vitamin (MULTIVITAMIN WITH MINERALS) TABS tablet, Take 1 tablet by mouth daily., Disp: , Rfl:    pantoprazole (PROTONIX) 40 MG tablet, Take 1 tablet (40 mg total) by mouth daily at 6 (six) AM., Disp: 30 tablet, Rfl: 1   pregabalin (LYRICA) 300 MG capsule, Take 300 mg by mouth 2 (two) times daily., Disp: , Rfl:    senna-docusate (SENOKOT S) 8.6-50 MG tablet, Take 1 tablet by mouth 2 (two) times daily., Disp: , Rfl:    thiamine 100 MG tablet, Take 1 tablet (100 mg total) by mouth daily., Disp: 30 tablet, Rfl: 0   traZODone (DESYREL) 100 MG tablet, Take 100 mg by mouth at bedtime as needed for sleep., Disp: , Rfl:    venlafaxine XR (EFFEXOR-XR) 150 MG 24 hr capsule, Take 150 mg by mouth daily with breakfast., Disp: , Rfl:   Social History   Tobacco Use  Smoking Status Every Day   Packs/day: 0.50   Types: Cigarettes  Smokeless Tobacco Current    No Known Allergies Objective:  There were no vitals filed for this visit. There is no height or weight on file to calculate BMI. Constitutional Well  developed. Well nourished.  Vascular Foot warm and well perfused. Capillary refill normal to all digits.   Neurologic Normal speech. Oriented to person, place, and time. Epicritic sensation to light touch grossly present bilaterally.  Dermatologic Skin healing well without signs of infection. Lateral aspect of incision dehisced with about 1 cm x 0.5 cm  wound to lateral fourth remaining digit. No erythema or edema noted.   Orthopedic: Tenderness to palpation noted about the surgical site.   Radiographs: Interval amputation of right fourth digit. No changes noted from hosptial. Reviewed from urgent care visit.  Assessment:   1. Post-operative state   2. Peripheral arterial disease (HCC)   3. Status post amputation of left great toe [facility])      Plan:  Patient was evaluated and treated and all questions answered.  S/p foot surgery right -Progressing as expected post-operatively. -WB Status: WBAT in surgical shoe.  - Wound debrided without incident.  -Medications: Percocet 10-325 mg sent to pharmacy.  -Foot redressed. Follow-up in 2 weeks for wound check.   No follow-ups on file.

## 2022-06-23 ENCOUNTER — Ambulatory Visit (INDEPENDENT_AMBULATORY_CARE_PROVIDER_SITE_OTHER): Payer: Medicare Other | Admitting: Podiatry

## 2022-06-23 DIAGNOSIS — Z9889 Other specified postprocedural states: Secondary | ICD-10-CM

## 2022-06-23 NOTE — Progress Notes (Unsigned)
  Subjective:  Patient ID: Erika [name], female    DOB: 21-Jun-1956,  MRN: [id number]  No chief complaint on file.   DOS: 04/21/22  Procedure: Right fourth partial digit amputation.   66 y.o. female returns for POV#4. Relates she is doing a bit better. Still having a lot of pain. Has been dressing the wound area.   Review of Systems: Negative except as noted in the HPI. Denies N/V/F/Ch.  Past Medical History:  Diagnosis Date   Arthritis    Depression    GERD (gastroesophageal reflux disease)    History of shingles 10/18/2018   Hypertension    Rheumatoid arthritis(714.0)     Current Outpatient Medications:    FLUoxetine (PROZAC) 20 MG capsule, Take 20 mg by mouth daily., Disp: , Rfl:    folic acid (FOLVITE) 1 MG tablet, Take 1 tablet (1 mg total) by mouth daily., Disp: , Rfl:    lamoTRIgine (LAMICTAL) 100 MG tablet, Take 100 mg by mouth 2 (two) times daily., Disp: , Rfl:    Multiple Vitamin (MULTIVITAMIN WITH MINERALS) TABS tablet, Take 1 tablet by mouth daily., Disp: , Rfl:    pantoprazole (PROTONIX) 40 MG tablet, Take 1 tablet (40 mg total) by mouth daily at 6 (six) AM., Disp: 30 tablet, Rfl: 1   pregabalin (LYRICA) 300 MG capsule, Take 300 mg by mouth 2 (two) times daily., Disp: , Rfl:    senna-docusate (SENOKOT S) 8.6-50 MG tablet, Take 1 tablet by mouth 2 (two) times daily., Disp: , Rfl:    thiamine 100 MG tablet, Take 1 tablet (100 mg total) by mouth daily., Disp: 30 tablet, Rfl: 0   traZODone (DESYREL) 100 MG tablet, Take 100 mg by mouth at bedtime as needed for sleep., Disp: , Rfl:    venlafaxine XR (EFFEXOR-XR) 150 MG 24 hr capsule, Take 150 mg by mouth daily with breakfast., Disp: , Rfl:   Social History   Tobacco Use  Smoking Status Every Day   Packs/day: 0.50   Types: Cigarettes  Smokeless Tobacco Current    No Known Allergies Objective:  There were no vitals filed for this visit. There is no height or weight on file to calculate BMI. Constitutional Well  developed. Well nourished.  Vascular Foot warm and well perfused. Capillary refill normal to all digits.   Neurologic Normal speech. Oriented to person, place, and time. Epicritic sensation to light touch grossly present bilaterally.  Dermatologic Skin healing well without signs of infection. Lateral aspect of incision dehisced with about 1 cm x 0.5 cm  wound to lateral fourth remaining digit. No erythema or edema noted.   Orthopedic: Tenderness to palpation noted about the surgical site.   Radiographs: Interval amputation of right fourth digit. No changes noted from hosptial. Reviewed from urgent care visit.  Assessment:   1. Post-operative state   2. Peripheral arterial disease (HCC)      Plan:  Patient was evaluated and treated and all questions answered.  S/p foot surgery right -Progressing as expected post-operatively. -WB Status: WBAT in surgical shoe.  - Wound debrided without incident.  -Medications: Percocet 10-325 mg sent to pharmacy. *** -Foot redressed. Follow-up in 2 weeks for wound check.   No follow-ups on file.

## 2022-06-24 ENCOUNTER — Telehealth: Payer: Self-pay | Admitting: *Deleted

## 2022-06-24 ENCOUNTER — Other Ambulatory Visit: Payer: Self-pay | Admitting: Podiatry

## 2022-06-24 MED ORDER — OXYCODONE-ACETAMINOPHEN 10-325 MG PO TABS: 1.0000 | ORAL_TABLET | Freq: Three times a day (TID) | ORAL | 0 refills | Status: AC | PRN

## 2022-06-24 NOTE — Telephone Encounter (Signed)
Patient is calling to request a pain medicine refill of oxy-ace,10-325 mg,please advise.

## 2022-06-24 NOTE — Telephone Encounter (Signed)
Patient has been notified

## 2022-06-24 NOTE — Telephone Encounter (Signed)
Refill sent. Please notify Thanks

## 2022-07-02 ENCOUNTER — Ambulatory Visit (INDEPENDENT_AMBULATORY_CARE_PROVIDER_SITE_OTHER): Payer: Medicare Other | Admitting: Podiatry

## 2022-07-02 DIAGNOSIS — Z91199 Patient's noncompliance with other medical treatment and regimen due to unspecified reason: Secondary | ICD-10-CM

## 2022-07-02 DIAGNOSIS — Z9889 Other specified postprocedural states: Secondary | ICD-10-CM

## 2022-07-02 NOTE — Progress Notes (Signed)
  Subjective:  Patient ID: Erika [name], female    DOB: 1956/02/17,  MRN: [id number]  No chief complaint on file.   DOS: 04/21/22  Procedure: Right fourth partial digit amputation.   66 y.o. female returns for POV#4. Relates she is doing a bit better. Still having a lot of pain. Has been dressing the wound area.   Review of Systems: Negative except as noted in the HPI. Denies N/V/F/Ch.  Past Medical History:  Diagnosis Date   Arthritis    Depression    GERD (gastroesophageal reflux disease)    History of shingles 10/18/2018   Hypertension    Rheumatoid arthritis(714.0)     Current Outpatient Medications:    FLUoxetine (PROZAC) 20 MG capsule, Take 20 mg by mouth daily., Disp: , Rfl:    folic acid (FOLVITE) 1 MG tablet, Take 1 tablet (1 mg total) by mouth daily., Disp: , Rfl:    lamoTRIgine (LAMICTAL) 100 MG tablet, Take 100 mg by mouth 2 (two) times daily., Disp: , Rfl:    Multiple Vitamin (MULTIVITAMIN WITH MINERALS) TABS tablet, Take 1 tablet by mouth daily., Disp: , Rfl:    pantoprazole (PROTONIX) 40 MG tablet, Take 1 tablet (40 mg total) by mouth daily at 6 (six) AM., Disp: 30 tablet, Rfl: 1   pregabalin (LYRICA) 300 MG capsule, Take 300 mg by mouth 2 (two) times daily., Disp: , Rfl:    senna-docusate (SENOKOT S) 8.6-50 MG tablet, Take 1 tablet by mouth 2 (two) times daily., Disp: , Rfl:    thiamine 100 MG tablet, Take 1 tablet (100 mg total) by mouth daily., Disp: 30 tablet, Rfl: 0   traZODone (DESYREL) 100 MG tablet, Take 100 mg by mouth at bedtime as needed for sleep., Disp: , Rfl:    venlafaxine XR (EFFEXOR-XR) 150 MG 24 hr capsule, Take 150 mg by mouth daily with breakfast., Disp: , Rfl:   Social History   Tobacco Use  Smoking Status Every Day   Packs/day: 0.50   Types: Cigarettes  Smokeless Tobacco Current    No Known Allergies Objective:  There were no vitals filed for this visit. There is no height or weight on file to calculate BMI. Constitutional Well  developed. Well nourished.  Vascular Foot warm and well perfused. Capillary refill normal to all digits.   Neurologic Normal speech. Oriented to person, place, and time. Epicritic sensation to light touch grossly present bilaterally.  Dermatologic Skin healing well without signs of infection. Lateral aspect of incision dehisced with about 1 cm x 0.5 cm  wound to lateral fourth remaining digit. No erythema or edema noted.   Orthopedic: Tenderness to palpation noted about the surgical site.   Radiographs: Interval amputation of right fourth digit. No changes noted from hosptial. Reviewed from urgent care visit.  Assessment:   1. Post operative foreign body, initial encounter      Plan:  Patient was evaluated and treated and all questions answered.  S/p foot surgery right -Progressing as expected post-operatively. -WB Status: WBAT in surgical shoe.  - Wound debrided without incident.  -Medications: Percocet 10-325 mg sent to pharmacy.  -Foot redressed. Follow-up in 2 weeks for wound check.   No follow-ups on file.

## 2022-08-06 ENCOUNTER — Telehealth: Payer: Self-pay | Admitting: *Deleted

## 2022-08-06 NOTE — Telephone Encounter (Signed)
error 

## 2022-08-06 NOTE — Telephone Encounter (Signed)
Has been sent to physician, for his approval

## 2022-08-06 NOTE — Telephone Encounter (Signed)
Patient is calling to ask for pain medicine, starting to have problems with foot again, also has a spot that she has noticed.  Please schedule for a f/u appointment as soon as possible.

## 2022-08-06 NOTE — Telephone Encounter (Signed)
I will evaluate on Monday

## 2022-08-06 NOTE — Telephone Encounter (Signed)
Called patient, no answer, could not leave voice message.

## 2022-08-09 ENCOUNTER — Ambulatory Visit: Payer: Medicare Other | Admitting: Podiatry

## 2023-05-11 ENCOUNTER — Encounter: Payer: Self-pay | Admitting: Physician Assistant

## 2023-05-11 ENCOUNTER — Ambulatory Visit: Payer: 59 | Admitting: Physician Assistant

## 2023-05-11 VITALS — BP 139/49 | HR 59 | Ht 60.0 in | Wt 149.0 lb

## 2023-05-11 DIAGNOSIS — D696 Thrombocytopenia, unspecified: Secondary | ICD-10-CM

## 2023-05-11 DIAGNOSIS — F419 Anxiety disorder, unspecified: Secondary | ICD-10-CM | POA: Diagnosis not present

## 2023-05-11 DIAGNOSIS — F3181 Bipolar II disorder: Secondary | ICD-10-CM | POA: Diagnosis not present

## 2023-05-11 DIAGNOSIS — Z1231 Encounter for screening mammogram for malignant neoplasm of breast: Secondary | ICD-10-CM

## 2023-05-11 DIAGNOSIS — F329 Major depressive disorder, single episode, unspecified: Secondary | ICD-10-CM

## 2023-05-11 DIAGNOSIS — F1010 Alcohol abuse, uncomplicated: Secondary | ICD-10-CM

## 2023-05-11 DIAGNOSIS — F5104 Psychophysiologic insomnia: Secondary | ICD-10-CM | POA: Diagnosis not present

## 2023-05-11 DIAGNOSIS — M069 Rheumatoid arthritis, unspecified: Secondary | ICD-10-CM

## 2023-05-11 DIAGNOSIS — Z13220 Encounter for screening for lipoid disorders: Secondary | ICD-10-CM

## 2023-05-11 DIAGNOSIS — K7460 Unspecified cirrhosis of liver: Secondary | ICD-10-CM

## 2023-05-11 MED ORDER — PREGABALIN 75 MG PO CAPS
75.0000 mg | ORAL_CAPSULE | Freq: Two times a day (BID) | ORAL | 0 refills | Status: DC
Start: 2023-05-11 — End: 2023-06-13

## 2023-05-11 MED ORDER — HYDROXYZINE PAMOATE 25 MG PO CAPS
25.0000 mg | ORAL_CAPSULE | Freq: Every evening | ORAL | 0 refills | Status: DC | PRN
Start: 2023-05-11 — End: 2023-06-13

## 2023-05-11 MED ORDER — THIAMINE HCL 100 MG PO TABS
100.0000 mg | ORAL_TABLET | Freq: Every day | ORAL | 0 refills | Status: DC
Start: 2023-05-11 — End: 2023-06-13

## 2023-05-11 NOTE — Patient Instructions (Addendum)
For your rheumatoid arthritis, you are going to restart Lyrica, you will restart at 75 mg twice daily and this will need to be titrated up.  I did start a referral for you to be seen by rheumatology as well.  To help with your insomnia, you are unable to take trazodone at this time.  I did send a prescription for hydroxyzine you can use this to help you fall asleep.  I do encourage you to take thiamine on a daily basis, avoid all alcohol and Tylenol products.  The Lyrica should also offer some relief from your anxiety.  We will call you with your lab results when they are available and start other medications as able.  I did start a referral for you to be seen by psychiatry to help manage your behavioral health medications.  I started a referral for you to have a mammogram.  To help with bipolar, you are going to start abilify once daily.    Roney Jaffe, PA-C Physician Assistant [facility] Medicine [email or web address]  Health Maintenance After Age 78 After age 7, you are at a higher risk for certain long-term diseases and infections as well as injuries from falls. Falls are a major cause of broken bones and head injuries in people who are older than age 3. Getting regular preventive care can help to keep you healthy and well. Preventive care includes getting regular testing and making lifestyle changes as recommended by your health care provider. Talk with your health care provider about: Which screenings and tests you should have. A screening is a test that checks for a disease when you have no symptoms. A diet and exercise plan that is right for you. What should I know about screenings and tests to prevent falls? Screening and testing are the best ways to find a health problem early. Early diagnosis and treatment give you the best chance of managing medical conditions that are common after age 5. Certain conditions and lifestyle choices  may make you more likely to have a fall. Your health care provider may recommend: Regular vision checks. Poor vision and conditions such as cataracts can make you more likely to have a fall. If you wear glasses, make sure to get your prescription updated if your vision changes. Medicine review. Work with your health care provider to regularly review all of the medicines you are taking, including over-the-counter medicines. Ask your health care provider about any side effects that may make you more likely to have a fall. Tell your health care provider if any medicines that you take make you feel dizzy or sleepy. Strength and balance checks. Your health care provider may recommend certain tests to check your strength and balance while standing, walking, or changing positions. Foot health exam. Foot pain and numbness, as well as not wearing proper footwear, can make you more likely to have a fall. Screenings, including: Osteoporosis screening. Osteoporosis is a condition that causes the bones to get weaker and break more easily. Blood pressure screening. Blood pressure changes and medicines to control blood pressure can make you feel dizzy. Depression screening. You may be more likely to have a fall if you have a fear of falling, feel depressed, or feel unable to do activities that you used to do. Alcohol use screening. Using too much alcohol can affect your balance and may make you more likely to have a fall. Follow these instructions at home: Lifestyle Do not drink alcohol if: Your health care provider tells you  not to drink. If you drink alcohol: Limit how much you have to: 0-1 drink a day for women. 0-2 drinks a day for men. Know how much alcohol is in your drink. In the [place] one drink equals one 12 oz bottle of beer (355 mL), one 5 oz glass of wine (148 mL), or one 1 oz glass of hard liquor (44 mL). Do not use any products that contain nicotine or tobacco. These products include cigarettes,  chewing tobacco, and vaping devices, such as e-cigarettes. If you need help quitting, ask your health care provider. Activity  Follow a regular exercise program to stay fit. This will help you maintain your balance. Ask your health care provider what types of exercise are appropriate for you. If you need a cane or walker, use it as recommended by your health care provider. Wear supportive shoes that have nonskid soles. Safety  Remove any tripping hazards, such as rugs, cords, and clutter. Install safety equipment such as grab bars in bathrooms and safety rails on stairs. Keep rooms and walkways well-lit. General instructions Talk with your health care provider about your risks for falling. Tell your health care provider if: You fall. Be sure to tell your health care provider about all falls, even ones that seem minor. You feel dizzy, tiredness (fatigue), or off-balance. Take over-the-counter and prescription medicines only as told by your health care provider. These include supplements. Eat a healthy diet and maintain a healthy weight. A healthy diet includes low-fat dairy products, low-fat (lean) meats, and fiber from whole grains, beans, and lots of fruits and vegetables. Stay current with your vaccines. Schedule regular health, dental, and eye exams. Summary Having a healthy lifestyle and getting preventive care can help to protect your health and wellness after age 44. Screening and testing are the best way to find a health problem early and help you avoid having a fall. Early diagnosis and treatment give you the best chance for managing medical conditions that are more common for people who are older than age 40. Falls are a major cause of broken bones and head injuries in people who are older than age 53. Take precautions to prevent a fall at home. Work with your health care provider to learn what changes you can make to improve your health and wellness and to prevent falls. This  information is not intended to replace advice given to you by your health care provider. Make sure you discuss any questions you have with your health care provider. Document Revised: 03/09/2021 Document Reviewed: 03/09/2021 Elsevier Patient Education  2024 [employer].

## 2023-05-11 NOTE — Progress Notes (Signed)
New Patient Office Visit  Subjective    Patient ID: Erika [name], female    DOB: 1956/05/22  Age: 67 y.o. MRN: [id number]  CC:  Chief Complaint  Patient presents with   Medication Refill    HPI Erika [name] states that she has not had any medications in approx one year.  States that she has been having depressed moods, irritability and difficulty sleeping. Difficulty falling asleep , is only sleeping 4-5 hours a night.  States that she was taking Lyrica for her rheumatoid arthritis.  States that she has been using advil without relief of her pain. States that she had a right fourth partial digit amputation in 04/2022 and still having pain from that as well.     05/11/2023   11:26 AM 10/18/2018    3:09 PM  Depression screen PHQ 2/9  Decreased Interest 3 1  Down, Depressed, Hopeless 3 0  PHQ - 2 Score 6 1  Altered sleeping 3   Tired, decreased energy 3   Change in appetite 2   Feeling bad or failure about yourself  2   Trouble concentrating 2   Moving slowly or fidgety/restless 0   Suicidal thoughts 0   PHQ-9 Score 18   Difficult doing work/chores Extremely dIfficult       05/11/2023   11:27 AM  GAD 7 : Generalized Anxiety Score  Nervous, Anxious, on Edge 3  Control/stop worrying 3  Worry too much - different things 3  Trouble relaxing 3  Restless 0  Easily annoyed or irritable 3  Afraid - awful might happen 0  Total GAD 7 Score 15       Outpatient Encounter Medications as of 05/11/2023  Medication Sig   FLUoxetine (PROZAC) 20 MG capsule Take 20 mg by mouth daily.   folic acid (FOLVITE) 1 MG tablet Take 1 tablet (1 mg total) by mouth daily.   hydrOXYzine (VISTARIL) 25 MG capsule Take 1 capsule (25 mg total) by mouth at bedtime as needed.   lamoTRIgine (LAMICTAL) 100 MG tablet Take 100 mg by mouth 2 (two) times daily.   Multiple Vitamin (MULTIVITAMIN WITH MINERALS) TABS tablet Take 1 tablet by mouth daily.   traZODone (DESYREL) 100 MG tablet Take 100  mg by mouth at bedtime as needed for sleep.   venlafaxine XR (EFFEXOR-XR) 150 MG 24 hr capsule Take 150 mg by mouth daily with breakfast.   pantoprazole (PROTONIX) 40 MG tablet Take 1 tablet (40 mg total) by mouth daily at 6 (six) AM. (Patient not taking: Reported on 05/11/2023)   pregabalin (LYRICA) 75 MG capsule Take 1 capsule (75 mg total) by mouth 2 (two) times daily.   senna-docusate (SENOKOT S) 8.6-50 MG tablet Take 1 tablet by mouth 2 (two) times daily. (Patient not taking: Reported on 05/11/2023)   thiamine (VITAMIN B1) 100 MG tablet Take 1 tablet (100 mg total) by mouth daily.   [DISCONTINUED] pregabalin (LYRICA) 300 MG capsule Take 300 mg by mouth 2 (two) times daily. (Patient not taking: Reported on 05/11/2023)   [DISCONTINUED] thiamine 100 MG tablet Take 1 tablet (100 mg total) by mouth daily. (Patient not taking: Reported on 05/11/2023)   No facility-administered encounter medications on file as of 05/11/2023.    Past Medical History:  Diagnosis Date   Arthritis    Depression    GERD (gastroesophageal reflux disease)    History of shingles 10/18/2018   Hypertension    Rheumatoid arthritis(714.0)     Past Surgical History:  Procedure Laterality Date   AMPUTATION TOE Right 04/24/2021   Procedure: AMPUTATION TOE;  Surgeon: Edwin Cap, DPM;  Location: [facility] OR;  Service: Podiatry;  Laterality: Right;   AMPUTATION TOE Left 07/11/2021   Procedure: AMPUTATION TOE;  Surgeon: Asencion Islam, DPM;  Location: [facility] OR;  Service: Podiatry;  Laterality: Left;   AMPUTATION TOE Right 04/21/2022   Procedure: AMPUTATION RIGHT FOURTH TOE;  Surgeon: Louann Sjogren, DPM;  Location: [facility] OR;  Service: Podiatry;  Laterality: Right;   BREAST REDUCTION SURGERY     REDUCTION MAMMAPLASTY Bilateral     Family History  Problem Relation Age of Onset   Psoriasis Sister     Social History   Socioeconomic History   Marital status: Divorced    Spouse name: Not on file   Number of children: Not on  file   Years of education: Not on file   Highest education level: Not on file  Occupational History   Not on file  Tobacco Use   Smoking status: Every Day    Packs/day: .5    Types: Cigarettes   Smokeless tobacco: Current  Vaping Use   Vaping Use: Never used  Substance and Sexual Activity   Alcohol use: Yes    Alcohol/week: 5.0 standard drinks of alcohol    Types: 5 Cans of beer per week    Comment: sometimes - none in a week   Drug use: Yes    Types: Marijuana    Comment: last 04/05/22   Sexual activity: Not on file  Other Topics Concern   Not on file  Social History Narrative   Not on file   Social Determinants of Health   Financial Resource Strain: Not on file  Food Insecurity: Not on file  Transportation Needs: Not on file  Physical Activity: Not on file  Stress: Not on file  Social Connections: Not on file  Intimate Partner Violence: Not on file    Review of Systems  Constitutional: Negative.   HENT: Negative.    Eyes: Negative.   Respiratory:  Negative for shortness of breath.   Cardiovascular:  Negative for chest pain.  Gastrointestinal:  Negative for abdominal pain and nausea.  Genitourinary: Negative.   Musculoskeletal: Negative.   Skin: Negative.   Neurological: Negative.   Endo/Heme/Allergies: Negative.   Psychiatric/Behavioral:  Positive for depression. Negative for suicidal ideas. The patient is nervous/anxious and has insomnia.         Objective    BP (!) 139/49 (BP Location: Left Arm, Patient Position: Sitting, Cuff Size: Large)   Pulse (!) 59   Ht 5' (1.524 m)   Wt 149 lb (67.6 kg)   BMI 29.10 kg/m   Physical Exam Vitals and nursing note reviewed.  Constitutional:      Appearance: Normal appearance.  HENT:     Head: Normocephalic and atraumatic.     Right Ear: External ear normal.     Left Ear: External ear normal.     Nose: Nose normal.     Mouth/Throat:     Mouth: Mucous membranes are moist.     Pharynx: Oropharynx is clear.   Eyes:     Extraocular Movements: Extraocular movements intact.     Conjunctiva/sclera: Conjunctivae normal.     Pupils: Pupils are equal, round, and reactive to light.  Cardiovascular:     Rate and Rhythm: Normal rate and regular rhythm.     Pulses: Normal pulses.     Heart sounds: Normal heart sounds.  Pulmonary:  Effort: Pulmonary effort is normal.     Breath sounds: Normal breath sounds.  Musculoskeletal:        General: Normal range of motion.     Cervical back: Normal range of motion and neck supple.  Skin:    General: Skin is warm and dry.  Neurological:     General: No focal deficit present.     Mental Status: She is alert and oriented to person, place, and time.  Psychiatric:        Mood and Affect: Mood normal.        Behavior: Behavior normal.        Thought Content: Thought content normal.        Judgment: Judgment normal.        Assessment & Plan:   Problem List Items Addressed This Visit       Musculoskeletal and Integument   Rheumatoid arthritis (HCC) - Primary   Relevant Medications   pregabalin (LYRICA) 75 MG capsule   Other Relevant Orders   CBC with Differential/Platelet   Comp. Metabolic Panel (12)   Ambulatory referral to Rheumatology     Hematopoietic and Hemostatic   Thrombocytopenia (HCC)     Other   Depression   Relevant Medications   hydrOXYzine (VISTARIL) 25 MG capsule   Alcohol use disorder   Relevant Medications   thiamine (VITAMIN B1) 100 MG tablet   Anxiety   Relevant Medications   hydrOXYzine (VISTARIL) 25 MG capsule   Bipolar 2 disorder (HCC)   Relevant Orders   TSH   Ambulatory referral to Psychiatry   Psychophysiological insomnia   Relevant Medications   hydrOXYzine (VISTARIL) 25 MG capsule   Other Visit Diagnoses     Hepatic cirrhosis, unspecified hepatic cirrhosis type, unspecified whether ascites present (HCC)       Relevant Orders   MR Abdomen W Wo Contrast   Encounter for screening mammogram for malignant  neoplasm of breast       Relevant Orders   MM DIGITAL SCREENING BILATERAL   Screening, lipid       Relevant Orders   Lipid panel      1. Rheumatoid arthritis, involving unspecified site, unspecified whether rheumatoid factor present (HCC) Restart Lyrica at lower dose.  Will see patient back in MMU in 4 weeks.  Patient given appt to establish care at [facility]   - CBC with Differential/Platelet - Comp. Metabolic Panel (12) - pregabalin (LYRICA) 75 MG capsule; Take 1 capsule (75 mg total) by mouth 2 (two) times daily.  Dispense: 60 capsule; Refill: 0 - Ambulatory referral to Rheumatology  2. Bipolar 2 disorder (HCC) Did review medicatons, unable to restart trazodone or lamictal at this time due to liver concerns.  Agreeable to trial abilify, refer to psych for medication mgmt - TSH - Ambulatory referral to Psychiatry  3. Major depressive disorder with current active episode, unspecified depression episode severity, unspecified whether recurrent   4. Anxiety   5. Psychophysiological insomnia Agreeable to trial hydroxyzine - hydrOXYzine (VISTARIL) 25 MG capsule; Take 1 capsule (25 mg total) by mouth at bedtime as needed.  Dispense: 30 capsule; Refill: 0  6. Hepatic cirrhosis, unspecified hepatic cirrhosis type, unspecified whether ascites present (HCC) [place] from 07/2021  IMPRESSION: Findings of cirrhosis and portal hypertension. Vague area of decreased echogenicity in the RIGHT hemiliver may represent artifact, given findings above would however suggest liver MRI or multiphase CT for further evaluation.   Small polyp in the dependent gallbladder at 6 mm.  RIGHT-sided nephrolithiasis.     Electronically Signed   By: Donzetta Kohut M.D.   On: 07/11/2021 11:48  - MR Abdomen W Wo Contrast; Future  7. Alcohol use disorder Resume  - thiamine (VITAMIN B1) 100 MG tablet; Take 1 tablet (100 mg total) by mouth daily.  Dispense: 30 tablet; Refill: 0  8. Thrombocytopenia  (HCC)   9. Encounter for screening mammogram for malignant neoplasm of breast  - MM DIGITAL SCREENING BILATERAL; Future  10. Screening, lipid  - Lipid panel   I have reviewed the patient's medical history (PMH, PSH, Social History, Family History, Medications, and allergies) , and have been updated if relevant. I spent 50 minutes reviewing chart and  face to face time with patient.    Return in about 4 weeks (around 06/08/2023) for With MMU.   Kasandra Knudsen Mayers, PA-C

## 2023-05-13 LAB — TSH: TSH: 1.17 u[IU]/mL (ref 0.450–4.500)

## 2023-05-13 LAB — CBC WITH DIFFERENTIAL/PLATELET
Basophils Absolute: 0 10*3/uL (ref 0.0–0.2)
Basos: 0 %
EOS (ABSOLUTE): 0 10*3/uL (ref 0.0–0.4)
Eos: 1 %
Hematocrit: 30.2 % — ABNORMAL LOW (ref 34.0–46.6)
Hemoglobin: 10.2 g/dL — ABNORMAL LOW (ref 11.1–15.9)
Immature Grans (Abs): 0 10*3/uL (ref 0.0–0.1)
Immature Granulocytes: 0 %
Lymphocytes Absolute: 0.4 10*3/uL — ABNORMAL LOW (ref 0.7–3.1)
Lymphs: 25 %
MCH: 32.9 pg (ref 26.6–33.0)
MCHC: 33.8 g/dL (ref 31.5–35.7)
MCV: 97 fL (ref 79–97)
Monocytes Absolute: 0.1 10*3/uL (ref 0.1–0.9)
Monocytes: 8 %
Neutrophils Absolute: 1 10*3/uL — ABNORMAL LOW (ref 1.4–7.0)
Neutrophils: 66 %
Platelets: 56 10*3/uL — CL (ref 150–450)
RBC: 3.1 x10E6/uL — ABNORMAL LOW (ref 3.77–5.28)
RDW: 13.4 % (ref 11.7–15.4)
WBC: 1.5 10*3/uL — CL (ref 3.4–10.8)

## 2023-05-13 LAB — COMP. METABOLIC PANEL (12)
AST: 27 IU/L (ref 0–40)
Albumin: 2.9 g/dL — ABNORMAL LOW (ref 3.9–4.9)
Alkaline Phosphatase: 102 IU/L (ref 44–121)
BUN/Creatinine Ratio: 10 — ABNORMAL LOW (ref 12–28)
BUN: 8 mg/dL (ref 8–27)
Bilirubin Total: 1 mg/dL (ref 0.0–1.2)
Calcium: 8.5 mg/dL — ABNORMAL LOW (ref 8.7–10.3)
Chloride: 106 mmol/L (ref 96–106)
Creatinine, Ser: 0.78 mg/dL (ref 0.57–1.00)
Globulin, Total: 3.8 g/dL (ref 1.5–4.5)
Glucose: 111 mg/dL — ABNORMAL HIGH (ref 70–99)
Potassium: 4.6 mmol/L (ref 3.5–5.2)
Sodium: 137 mmol/L (ref 134–144)
Total Protein: 6.7 g/dL (ref 6.0–8.5)
eGFR: 83 mL/min/{1.73_m2} (ref >59)

## 2023-05-13 LAB — LIPID PANEL
Chol/HDL Ratio: 2.2 ratio (ref 0.0–4.4)
Cholesterol, Total: 118 mg/dL (ref 100–199)
HDL: 54 mg/dL (ref >39)
LDL Chol Calc (NIH): 51 mg/dL (ref 0–99)
Triglycerides: 62 mg/dL (ref 0–149)
VLDL Cholesterol Cal: 13 mg/dL (ref 5–40)

## 2023-05-16 NOTE — Addendum Note (Signed)
Addended by: Roney Jaffe on: 05/16/2023 09:25 AM   Modules accepted: Orders

## 2023-05-25 ENCOUNTER — Ambulatory Visit (HOSPITAL_COMMUNITY)
Admission: RE | Admit: 2023-05-25 | Discharge: 2023-05-25 | Disposition: A | Discharge status: Home / Self Care | Payer: 59 | Source: Ambulatory Visit | Attending: Physician Assistant | Admitting: Physician Assistant

## 2023-05-25 DIAGNOSIS — K7460 Unspecified cirrhosis of liver: Secondary | ICD-10-CM | POA: Diagnosis not present

## 2023-05-25 MED ORDER — GADOBUTROL 1 MMOL/ML IV SOLN
7.0000 mL | Freq: Once | INTRAVENOUS | Status: AC | PRN
  Administered 2023-05-25: 7 mL via INTRAVENOUS

## 2023-05-26 ENCOUNTER — Ambulatory Visit
Admission: RE | Admit: 2023-05-26 | Discharge: 2023-05-26 | Disposition: A | Discharge status: Home / Self Care | Payer: 59 | Source: Ambulatory Visit | Attending: Physician Assistant | Admitting: Physician Assistant

## 2023-05-26 ENCOUNTER — Encounter: Payer: Self-pay | Admitting: Physician Assistant

## 2023-05-26 DIAGNOSIS — Z1231 Encounter for screening mammogram for malignant neoplasm of breast: Secondary | ICD-10-CM

## 2023-06-02 ENCOUNTER — Other Ambulatory Visit: Payer: Self-pay | Admitting: Physician Assistant

## 2023-06-02 DIAGNOSIS — F5104 Psychophysiologic insomnia: Secondary | ICD-10-CM

## 2023-06-13 ENCOUNTER — Encounter: Payer: Self-pay | Admitting: Physician Assistant

## 2023-06-13 ENCOUNTER — Ambulatory Visit: Payer: 59 | Admitting: Physician Assistant

## 2023-06-13 VITALS — BP 119/58 | HR 56 | Ht 61.0 in | Wt 146.0 lb

## 2023-06-13 DIAGNOSIS — M069 Rheumatoid arthritis, unspecified: Secondary | ICD-10-CM

## 2023-06-13 DIAGNOSIS — F17210 Nicotine dependence, cigarettes, uncomplicated: Secondary | ICD-10-CM

## 2023-06-13 DIAGNOSIS — F3181 Bipolar II disorder: Secondary | ICD-10-CM | POA: Diagnosis not present

## 2023-06-13 DIAGNOSIS — F5104 Psychophysiologic insomnia: Secondary | ICD-10-CM

## 2023-06-13 DIAGNOSIS — F1010 Alcohol abuse, uncomplicated: Secondary | ICD-10-CM

## 2023-06-13 DIAGNOSIS — K7031 Alcoholic cirrhosis of liver with ascites: Secondary | ICD-10-CM | POA: Diagnosis not present

## 2023-06-13 DIAGNOSIS — F419 Anxiety disorder, unspecified: Secondary | ICD-10-CM | POA: Diagnosis not present

## 2023-06-13 MED ORDER — HYDROXYZINE PAMOATE 25 MG PO CAPS
ORAL_CAPSULE | ORAL | 0 refills | Status: DC
Start: 2023-06-13 — End: 2023-07-08

## 2023-06-13 MED ORDER — THIAMINE HCL 100 MG PO TABS
100.0000 mg | ORAL_TABLET | Freq: Every day | ORAL | 0 refills | Status: DC
Start: 2023-06-13 — End: 2023-07-08

## 2023-06-13 MED ORDER — ARIPIPRAZOLE 5 MG PO TABS
5.0000 mg | ORAL_TABLET | Freq: Every day | ORAL | 1 refills | Status: DC
Start: 2023-06-13 — End: 2023-07-08

## 2023-06-13 MED ORDER — PREGABALIN 100 MG PO CAPS
100.0000 mg | ORAL_CAPSULE | Freq: Two times a day (BID) | ORAL | 0 refills | Status: DC
Start: 2023-06-13 — End: 2023-07-08

## 2023-06-13 NOTE — Progress Notes (Unsigned)
   Established Patient Office Visit  Subjective   Patient ID: RONDALYN BERO, female    DOB: 01/12/1956  Age: 67 y.o. MRN: [id number]  Chief Complaint  Patient presents with   Medication Refill    [facility] [address] [place], [place] [address] PH# [phone]     1. Rheumatoid arthritis, involving unspecified site, unspecified whether rheumatoid factor present (HCC) Restart Lyrica at lower dose.  Will see patient back in [facility] in 4 weeks.  Patient given appt to establish care at [facility]     - CBC with Differential/Platelet - Comp. Metabolic Panel (12) - pregabalin (LYRICA) 75 MG capsule; Take 1 capsule (75 mg total) by mouth 2 (two) times daily.  Dispense: 60 capsule; Refill: 0 - Ambulatory referral to Rheumatology   2. Bipolar 2 disorder (HCC) Did review medicatons, unable to restart trazodone or lamictal at this time due to liver concerns.  Agreeable to trial abilify, refer to psych for medication mgmt - TSH - Ambulatory referral to Psychiatry   3. Major depressive disorder with current active episode, unspecified depression episode severity, unspecified whether recurrent     4. Anxiety     5. Psychophysiological insomnia Agreeable to trial hydroxyzine - hydrOXYzine (VISTARIL) 25 MG capsule; Take 1 capsule (25 mg total) by mouth at bedtime as needed.  Dispense: 30 capsule; Refill: 0   6. Hepatic cirrhosis, unspecified hepatic cirrhosis type, unspecified whether ascites present (HCC) [place] from 07/2021   IMPRESSION: Findings of cirrhosis and portal hypertension. Vague area of decreased echogenicity in the RIGHT hemiliver may represent artifact, given findings above would however suggest liver MRI or multiphase CT for further evaluation.   Small polyp in the dependent gallbladder at 6 mm.   RIGHT-sided nephrolithiasis.     Electronically Signed   By: Donzetta Kohut M.D.   On: 07/11/2021 11:48   - MR Abdomen W Wo Contrast; Future   7. Alcohol use disorder Resume  -  thiamine (VITAMIN B1) 100 MG tablet; Take 1 tablet (100 mg total) by mouth daily.  Dispense: 30 tablet; Refill: 0   8. Thrombocytopenia (HCC)     9. Encounter for screening mammogram for malignant neoplasm of breast   - MM DIGITAL SCREENING BILATERAL; Future   10. Screening, lipid   - Lipid panel     I have reviewed the patient's medical history (PMH, PSH, Social History, Family History, Medications, and allergies) , and have been updated if relevant. I spent 50 minutes reviewing chart and  face to face time with patient.            {History (Optional):23778}  ROS    Objective:     Ht 5\' 1"  (1.549 m)   Wt 146 lb (66.2 kg)   BMI 27.59 kg/m  {Vitals History (Optional):23777}  Physical Exam   No results found for any visits on 06/13/23.  {Labs (Optional):23779}  The ASCVD Risk score (Arnett DK, et al., 2019) failed to calculate for the following reasons:   The valid total cholesterol range is 130 to 320 mg/dL    Assessment & Plan:   Problem List Items Addressed This Visit   None Call daughter with appointments  and results   No follow-ups on file.    Kasandra Knudsen Mayers, PA-C

## 2023-06-13 NOTE — Patient Instructions (Addendum)
I increased the dose of the Lyrica to 100 mg twice daily.  I increased your dose of hydroxyzine to 50 mg twice daily.  That will still be a 25 mg capsule, you can take 2 at bedtime or take 1 at 6 PM and then another 1 before bedtime.  You are going to start the Abilify 5 mg at bedtime.  We will call you with your appointment for your ascites.  Please make sure to follow-up with gastroenterology   [facility] [address] [place], [place] [address] PH# [phone]   Roney Jaffe, PA-C Physician Assistant [facility] [email or web address]   Ascites  Ascites is a collection of too much fluid in the abdomen. Ascites can range from mild to severe. If ascites is not treated, it can get worse. What are the causes? This condition may be caused by: A liver condition called cirrhosis. This is the most common cause of ascites. Long-term (chronic) or alcoholic hepatitis. Infection or inflammation in the abdomen. Cancer in the abdomen. Heart failure. Kidney disease. Inflammation of the pancreas. Clots in the veins of the liver. What are the signs or symptoms? Symptoms of this condition include: A feeling of fullness in the abdomen. This is common. An increase in the size of the abdomen or waist. Swelling in the legs. Swelling of the scrotum. Difficulty breathing. Pain in the abdomen. Sudden weight gain. If the condition is mild, you may not have symptoms. How is this diagnosed? This condition is diagnosed based on your medical history and a physical exam. Your health care provider may order imaging tests, such as an ultrasound or CT scan of your abdomen. How is this treated? Treatment for this condition depends on the cause of the ascites. It may include: Taking a pill to make you urinate. This is called a water pill (diuretic pill). Strictly reducing your salt (sodium) intake. Salt can cause extra fluid to be kept (retained) in the  body, and this makes ascites worse. Having a procedure to remove fluid from your abdomen (paracentesis). Having a procedure that connects two of the major veins within your liver and relieves pressure on your liver. This is called a TIPS procedure (transjugular intrahepatic portosystemic shunt procedure). Placement of a drainage catheter (peritoneovenous shunt) to manage the extra fluid in the abdomen. Ascites may go away or improve when the condition that caused it is treated. Follow these instructions at home: Eating and drinking Keep track of your weight. To do this, weigh yourself at the same time every day and write down your weight. Try not to eat salty (high-sodium) foods. Follow any instructions that your health care provider gives you about how much to drink. Keep track of how much you drink and any changes in how much or how often you urinate. General instructions Report any changes in your health to your health care provider, especially if you develop new symptoms or your symptoms get worse. Take over-the-counter and prescription medicines only as told by your health care provider. Keep all follow-up visits. This is important. Contact a health care provider if: You gain more than 3 lb (1.36 kg) in 3 days. Your waist size increases. You have new swelling in your legs. The swelling in your legs gets worse. Get help right away if: You have a fever or chills. You are confused. You have new or worsening breathing trouble. You have new or worsening pain in your abdomen. You have new or worsening swelling in the scrotum. Summary Ascites is  a collection of too much fluid in the abdomen. Ascites may be caused by various conditions, such as cirrhosis, hepatitis, cancer, or congestive heart failure. Symptoms may include swelling of the abdomen and other areas due to extra fluid in the body. Treatments may involve dietary changes, medicines, or procedures. This information is not  intended to replace advice given to you by your health care provider. Make sure you discuss any questions you have with your health care provider. Document Revised: 07/01/2020 Document Reviewed: 07/01/2020 Elsevier Patient Education  2024 [employer].

## 2023-06-14 ENCOUNTER — Telehealth: Payer: Self-pay

## 2023-06-14 NOTE — Telephone Encounter (Signed)
Patient's daughter contacted with an update on IR Paracentesis appointment scheduled for patient on Wednesday 08.14.2024 at 2pm.   Her daughter was also informed of a referral for psychiatry for medication management was sent to [facility] [address]  [address] [place], [place] [address] Phone: [phone] Fax: [phone] She need to call to make an appointment.   Information was also sent to mobile number through text message, requested by daughter.

## 2023-06-14 NOTE — Addendum Note (Signed)
Addended by: Roney Jaffe on: 06/14/2023 02:15 PM   Modules accepted: Orders

## 2023-06-15 ENCOUNTER — Ambulatory Visit (HOSPITAL_COMMUNITY): Payer: 59 | Attending: Physician Assistant

## 2023-06-16 ENCOUNTER — Other Ambulatory Visit (HOSPITAL_COMMUNITY): Payer: 59

## 2023-06-22 ENCOUNTER — Ambulatory Visit: Payer: Self-pay | Admitting: *Deleted

## 2023-06-22 NOTE — Telephone Encounter (Signed)
  Chief Complaint: possible side effect from medication abilify.  Symptoms: bilateral ankle swelling left greater than right. Knee swelling reported. Reports extreme thirst, overeating, fatigue.  Frequency: after 06/13/23 Pertinent Negatives: Patient denies chest pain no difficulty breathing  Disposition: [] ED /[] Urgent Care (no appt availability in office) / [] Appointment(In office/virtual)/ []  [facility]/ [] Home Care/ [x] Refused Recommended Disposition /[] [facility]/ []  Follow-up with PCP Additional Notes:   Non PCP at this time per patient. Will see new PCP in Sept. Recommended to go to mobile bus. Patient declined no transportation. Patient reports she has stopped taking abilify and feels like sx are some better. Hydroxizine effective at bedtime and pregalalin help with sx. Recommended patient to elevated LEs 20 minutes 2 times daily decrease swelling and monitor intake. Patient requesting pain med OTC. Recommended ibuprofen no more than 400 mg every 6 hours and no tylenol due to hx liver issues. Recommended if sx worsen call back.     Reason for Disposition  [1] MODERATE leg swelling (e.g., swelling extends up to knees) AND [2] new-onset or worsening  Answer Assessment - Initial Assessment Questions 1. ONSET: "When did the swelling start?" (e.g., minutes, hours, days)     After starting abilify 5 mg 06/14/23. 2. LOCATION: "What part of the leg is swollen?"  "Are both legs swollen or just one leg?"     Bilateral legs and ankles. Left ankle more swollen than right  3. SEVERITY: "How bad is the swelling?" (e.g., localized; mild, moderate, severe)   - Localized: Small area of swelling localized to one leg.   - MILD pedal edema: Swelling limited to foot and ankle, pitting edema < 1/4 inch (6 mm) deep, rest and elevation eliminate most or all swelling.   - MODERATE edema: Swelling of lower leg to knee, pitting edema > 1/4 inch (6 mm) deep, rest and elevation only partially  reduce swelling.   - SEVERE edema: Swelling extends above knee, facial or hand swelling present.      Ankles and knees  4. REDNESS: "Does the swelling look red or infected?"     na 5. PAIN: "Is the swelling painful to touch?" If Yes, ask: "How painful is it?"   (Scale 1-10; mild, moderate or severe)     na 6. FEVER: "Do you have a fever?" If Yes, ask: "What is it, how was it measured, and when did it start?"      na 7. CAUSE: "What do you think is causing the leg swelling?"     Medication abilify  8. MEDICAL HISTORY: "Do you have a history of blood clots (e.g., DVT), cancer, heart failure, kidney disease, or liver failure?"     See hx  9. RECURRENT SYMPTOM: "Have you had leg swelling before?" If Yes, ask: "When was the last time?" "What happened that time?"     na 10. OTHER SYMPTOMS: "Do you have any other symptoms?" (e.g., chest pain, difficulty breathing)       Extreme thirst, overeating, fatigue, swelling in ankles and knees  11. PREGNANCY: "Is there any chance you are pregnant?" "When was your last menstrual period?"       na  Protocols used: Leg Swelling and Edema-A-AH

## 2023-06-28 ENCOUNTER — Encounter: Payer: Self-pay | Admitting: Gastroenterology

## 2023-07-05 ENCOUNTER — Other Ambulatory Visit: Payer: Self-pay | Admitting: Physician Assistant

## 2023-07-05 DIAGNOSIS — F3181 Bipolar II disorder: Secondary | ICD-10-CM

## 2023-07-05 DIAGNOSIS — F5104 Psychophysiologic insomnia: Secondary | ICD-10-CM

## 2023-07-08 ENCOUNTER — Encounter: Payer: Self-pay | Admitting: Nurse Practitioner

## 2023-07-08 ENCOUNTER — Other Ambulatory Visit: Payer: Self-pay | Admitting: Physician Assistant

## 2023-07-08 ENCOUNTER — Ambulatory Visit: Payer: 59 | Attending: Nurse Practitioner | Admitting: Nurse Practitioner

## 2023-07-08 VITALS — BP 108/63 | HR 77 | Ht 61.0 in | Wt 135.4 lb

## 2023-07-08 DIAGNOSIS — D72819 Decreased white blood cell count, unspecified: Secondary | ICD-10-CM

## 2023-07-08 DIAGNOSIS — F3181 Bipolar II disorder: Secondary | ICD-10-CM | POA: Diagnosis not present

## 2023-07-08 DIAGNOSIS — Z1211 Encounter for screening for malignant neoplasm of colon: Secondary | ICD-10-CM

## 2023-07-08 DIAGNOSIS — M069 Rheumatoid arthritis, unspecified: Secondary | ICD-10-CM

## 2023-07-08 DIAGNOSIS — Z122 Encounter for screening for malignant neoplasm of respiratory organs: Secondary | ICD-10-CM

## 2023-07-08 DIAGNOSIS — Z7689 Persons encountering health services in other specified circumstances: Secondary | ICD-10-CM

## 2023-07-08 DIAGNOSIS — F5101 Primary insomnia: Secondary | ICD-10-CM | POA: Diagnosis not present

## 2023-07-08 DIAGNOSIS — F5104 Psychophysiologic insomnia: Secondary | ICD-10-CM

## 2023-07-08 DIAGNOSIS — F1010 Alcohol abuse, uncomplicated: Secondary | ICD-10-CM | POA: Diagnosis not present

## 2023-07-08 MED ORDER — TRAZODONE HCL 100 MG PO TABS: 100.0000 mg | ORAL_TABLET | Freq: Every evening | ORAL | 1 refills | Status: DC | PRN

## 2023-07-08 MED ORDER — THIAMINE HCL 100 MG PO TABS
100.0000 mg | ORAL_TABLET | Freq: Every day | ORAL | 1 refills | Status: AC
Start: 2023-07-08 — End: ?

## 2023-07-08 MED ORDER — TRAZODONE HCL 100 MG PO TABS
100.0000 mg | ORAL_TABLET | Freq: Every day | ORAL | 1 refills | Status: AC
Start: 2023-07-08 — End: ?

## 2023-07-08 MED ORDER — PREGABALIN 100 MG PO CAPS
100.0000 mg | ORAL_CAPSULE | Freq: Two times a day (BID) | ORAL | 1 refills | Status: AC
Start: 2023-07-08 — End: ?

## 2023-07-08 MED ORDER — VENLAFAXINE HCL ER 150 MG PO CP24
150.0000 mg | ORAL_CAPSULE | Freq: Every day | ORAL | 1 refills | Status: AC
Start: 2023-07-08 — End: ?

## 2023-07-08 MED ORDER — LAMOTRIGINE 100 MG PO TABS
100.0000 mg | ORAL_TABLET | Freq: Two times a day (BID) | ORAL | 0 refills | Status: AC
Start: 2023-07-08 — End: ?

## 2023-07-08 MED ORDER — FLUOXETINE HCL 20 MG PO CAPS
20.0000 mg | ORAL_CAPSULE | Freq: Every day | ORAL | 1 refills | Status: AC
Start: 2023-07-08 — End: ?

## 2023-07-08 NOTE — Progress Notes (Unsigned)
Assessment & Plan:  Erika [name] was seen today for establish care.  Diagnoses and all orders for this visit:  Screening for lung cancer -     CT CHEST LUNG CA SCREEN LOW DOSE W/O CM; Future  Colon cancer screening -     Ambulatory referral to Gastroenterology  Other orders -     traZODone (DESYREL) 100 MG tablet; Take 1 tablet (100 mg total) by mouth at bedtime as needed for sleep. -     venlafaxine XR (EFFEXOR-XR) 150 MG 24 hr capsule; Take 1 capsule (150 mg total) by mouth daily with breakfast.    Patient has been counseled on age-appropriate routine health concerns for screening and prevention. These are reviewed and up-to-date. Referrals have been placed accordingly. Immunizations are up-to-date or declined.    Subjective:   Chief Complaint  Patient presents with   Establish Care   HPI Erika [name] 67 y.o. female presents to office today   ROS  Past Medical History:  Diagnosis Date   Arthritis    Cirrhosis (HCC)    Depression    GERD (gastroesophageal reflux disease)    History of shingles 10/18/2018   Hypertension    Rheumatoid arthritis(714.0)     Past Surgical History:  Procedure Laterality Date   AMPUTATION TOE Right 04/24/2021   Procedure: AMPUTATION TOE;  Surgeon: Edwin Cap, DPM;  Location: [facility] OR;  Service: Podiatry;  Laterality: Right;   AMPUTATION TOE Left 07/11/2021   Procedure: AMPUTATION TOE;  Surgeon: Asencion Islam, DPM;  Location: [facility] OR;  Service: Podiatry;  Laterality: Left;   AMPUTATION TOE Right 04/21/2022   Procedure: AMPUTATION RIGHT FOURTH TOE;  Surgeon: Louann Sjogren, DPM;  Location: [facility] OR;  Service: Podiatry;  Laterality: Right;   BREAST REDUCTION SURGERY     REDUCTION MAMMAPLASTY Bilateral     Family History  Problem Relation Age of Onset   Psoriasis Sister    Breast cancer Neg Hx     Social History Reviewed with no changes to be made today.   Outpatient Medications Prior to Visit  Medication Sig Dispense Refill    hydrOXYzine (VISTARIL) 25 MG capsule Take 1-2 caps PO at bedtime prn for insomnia 60 capsule 0   pregabalin (LYRICA) 100 MG capsule Take 1 capsule (100 mg total) by mouth 2 (two) times daily. 60 capsule 0   ARIPiprazole (ABILIFY) 5 MG tablet Take 1 tablet (5 mg total) by mouth daily. (Patient not taking: Reported on 07/08/2023) 30 tablet 1   FLUoxetine (PROZAC) 20 MG capsule Take 20 mg by mouth daily. (Patient not taking: Reported on 07/08/2023)     folic acid (FOLVITE) 1 MG tablet Take 1 tablet (1 mg total) by mouth daily. (Patient not taking: Reported on 07/08/2023)     lamoTRIgine (LAMICTAL) 100 MG tablet Take 100 mg by mouth 2 (two) times daily. (Patient not taking: Reported on 07/08/2023)     Multiple Vitamin (MULTIVITAMIN WITH MINERALS) TABS tablet Take 1 tablet by mouth daily. (Patient not taking: Reported on 07/08/2023)     pantoprazole (PROTONIX) 40 MG tablet Take 1 tablet (40 mg total) by mouth daily at 6 (six) AM. (Patient not taking: Reported on 05/11/2023) 30 tablet 1   senna-docusate (SENOKOT S) 8.6-50 MG tablet Take 1 tablet by mouth 2 (two) times daily. (Patient not taking: Reported on 05/11/2023)     thiamine (VITAMIN B1) 100 MG tablet Take 1 tablet (100 mg total) by mouth daily. (Patient not taking: Reported on 07/08/2023) 30 tablet  0   traZODone (DESYREL) 100 MG tablet Take 100 mg by mouth at bedtime as needed for sleep. (Patient not taking: Reported on 07/08/2023)     venlafaxine XR (EFFEXOR-XR) 150 MG 24 hr capsule Take 150 mg by mouth daily with breakfast. (Patient not taking: Reported on 07/08/2023)     No facility-administered medications prior to visit.    No Known Allergies     Objective:    BP 108/63 (BP Location: Left Arm, Patient Position: Sitting, Cuff Size: Normal)   Pulse 77   Ht 5\' 1"  (1.549 m)   Wt 135 lb 6.4 oz (61.4 kg)   SpO2 98%   BMI 25.58 kg/m  Wt Readings from Last 3 Encounters:  07/08/23 135 lb 6.4 oz (61.4 kg)  06/13/23 146 lb (66.2 kg)  05/11/23 149 lb (67.6  kg)    Physical Exam       Patient has been counseled extensively about nutrition and exercise as well as the importance of adherence with medications and regular follow-up. The patient was given clear instructions to go to ER or return to medical center if symptoms don't improve, worsen or new problems develop. The patient verbalized understanding.   Follow-up: No follow-ups on file.   Claiborne Rigg, FNP-BC [facility] [place], [place] [phone]   07/08/2023, 3:54 PM

## 2023-07-08 NOTE — Patient Instructions (Signed)
Sent Referral to   [facility] [address]  [address] [place], [place] [address] Phone: [phone] Fax: [phone]

## 2023-07-10 ENCOUNTER — Other Ambulatory Visit: Payer: Self-pay | Admitting: Nurse Practitioner

## 2023-07-10 DIAGNOSIS — D696 Thrombocytopenia, unspecified: Secondary | ICD-10-CM

## 2023-07-10 DIAGNOSIS — D72819 Decreased white blood cell count, unspecified: Secondary | ICD-10-CM

## 2023-07-10 LAB — CBC WITH DIFFERENTIAL/PLATELET
Basophils Absolute: 0 10*3/uL (ref 0.0–0.2)
Basos: 0 %
EOS (ABSOLUTE): 0 10*3/uL (ref 0.0–0.4)
Eos: 1 %
Hematocrit: 31.6 % — ABNORMAL LOW (ref 34.0–46.6)
Hemoglobin: 10.7 g/dL — ABNORMAL LOW (ref 11.1–15.9)
Immature Grans (Abs): 0 10*3/uL (ref 0.0–0.1)
Immature Granulocytes: 1 %
Lymphocytes Absolute: 0.6 10*3/uL — ABNORMAL LOW (ref 0.7–3.1)
Lymphs: 22 %
MCH: 32 pg (ref 26.6–33.0)
MCHC: 33.9 g/dL (ref 31.5–35.7)
MCV: 95 fL (ref 79–97)
Monocytes Absolute: 0.4 10*3/uL (ref 0.1–0.9)
Monocytes: 13 %
Neutrophils Absolute: 1.8 10*3/uL (ref 1.4–7.0)
Neutrophils: 63 %
Platelets: 71 10*3/uL — CL (ref 150–450)
RBC: 3.34 x10E6/uL — ABNORMAL LOW (ref 3.77–5.28)
RDW: 13.4 % (ref 11.7–15.4)
WBC: 2.9 10*3/uL — ABNORMAL LOW (ref 3.4–10.8)

## 2023-07-10 LAB — CMP14+EGFR
ALT: 15 IU/L (ref 0–32)
AST: 34 IU/L (ref 0–40)
Albumin: 2.8 g/dL — ABNORMAL LOW (ref 3.9–4.9)
Alkaline Phosphatase: 118 IU/L (ref 44–121)
BUN/Creatinine Ratio: 19 (ref 12–28)
BUN: 18 mg/dL (ref 8–27)
Bilirubin Total: 1.8 mg/dL — ABNORMAL HIGH (ref 0.0–1.2)
CO2: 21 mmol/L (ref 20–29)
Calcium: 7.9 mg/dL — ABNORMAL LOW (ref 8.7–10.3)
Chloride: 109 mmol/L — ABNORMAL HIGH (ref 96–106)
Creatinine, Ser: 0.97 mg/dL (ref 0.57–1.00)
Globulin, Total: 3.9 g/dL (ref 1.5–4.5)
Glucose: 80 mg/dL (ref 70–99)
Potassium: 4.4 mmol/L (ref 3.5–5.2)
Sodium: 141 mmol/L (ref 134–144)
Total Protein: 6.7 g/dL (ref 6.0–8.5)
eGFR: 64 mL/min/{1.73_m2} (ref >59)

## 2023-07-11 ENCOUNTER — Encounter: Payer: Self-pay | Admitting: Nurse Practitioner

## 2023-07-16 ENCOUNTER — Emergency Department (HOSPITAL_COMMUNITY): Payer: 59

## 2023-07-16 ENCOUNTER — Emergency Department (HOSPITAL_BASED_OUTPATIENT_CLINIC_OR_DEPARTMENT_OTHER): Payer: 59

## 2023-07-16 ENCOUNTER — Inpatient Hospital Stay (HOSPITAL_COMMUNITY)
Admission: EM | Admit: 2023-07-16 | Discharge: 2023-08-02 | DRG: 432 | Disposition: E | Payer: 59 | Attending: Pulmonary Disease | Admitting: Pulmonary Disease

## 2023-07-16 ENCOUNTER — Inpatient Hospital Stay (HOSPITAL_COMMUNITY): Payer: 59

## 2023-07-16 ENCOUNTER — Encounter (HOSPITAL_COMMUNITY): Payer: Self-pay

## 2023-07-16 DIAGNOSIS — M069 Rheumatoid arthritis, unspecified: Secondary | ICD-10-CM | POA: Diagnosis present

## 2023-07-16 DIAGNOSIS — F1410 Cocaine abuse, uncomplicated: Secondary | ICD-10-CM | POA: Diagnosis present

## 2023-07-16 DIAGNOSIS — Z515 Encounter for palliative care: Secondary | ICD-10-CM

## 2023-07-16 DIAGNOSIS — D62 Acute posthemorrhagic anemia: Secondary | ICD-10-CM | POA: Diagnosis present

## 2023-07-16 DIAGNOSIS — Z89412 Acquired absence of left great toe: Secondary | ICD-10-CM

## 2023-07-16 DIAGNOSIS — K3189 Other diseases of stomach and duodenum: Secondary | ICD-10-CM | POA: Diagnosis present

## 2023-07-16 DIAGNOSIS — I071 Rheumatic tricuspid insufficiency: Secondary | ICD-10-CM | POA: Diagnosis present

## 2023-07-16 DIAGNOSIS — E860 Dehydration: Secondary | ICD-10-CM | POA: Diagnosis present

## 2023-07-16 DIAGNOSIS — Z539 Procedure and treatment not carried out, unspecified reason: Secondary | ICD-10-CM | POA: Diagnosis present

## 2023-07-16 DIAGNOSIS — S7002XA Contusion of left hip, initial encounter: Secondary | ICD-10-CM | POA: Diagnosis present

## 2023-07-16 DIAGNOSIS — I471 Supraventricular tachycardia, unspecified: Secondary | ICD-10-CM | POA: Diagnosis not present

## 2023-07-16 DIAGNOSIS — R34 Anuria and oliguria: Secondary | ICD-10-CM | POA: Diagnosis not present

## 2023-07-16 DIAGNOSIS — K7031 Alcoholic cirrhosis of liver with ascites: Principal | ICD-10-CM | POA: Diagnosis present

## 2023-07-16 DIAGNOSIS — G9340 Encephalopathy, unspecified: Secondary | ICD-10-CM | POA: Diagnosis present

## 2023-07-16 DIAGNOSIS — J9621 Acute and chronic respiratory failure with hypoxia: Secondary | ICD-10-CM | POA: Diagnosis present

## 2023-07-16 DIAGNOSIS — K7682 Hepatic encephalopathy: Secondary | ICD-10-CM | POA: Diagnosis present

## 2023-07-16 DIAGNOSIS — R339 Retention of urine, unspecified: Secondary | ICD-10-CM | POA: Diagnosis not present

## 2023-07-16 DIAGNOSIS — E1122 Type 2 diabetes mellitus with diabetic chronic kidney disease: Secondary | ICD-10-CM | POA: Diagnosis present

## 2023-07-16 DIAGNOSIS — R578 Other shock: Secondary | ICD-10-CM | POA: Diagnosis present

## 2023-07-16 DIAGNOSIS — K921 Melena: Secondary | ICD-10-CM | POA: Diagnosis not present

## 2023-07-16 DIAGNOSIS — R9431 Abnormal electrocardiogram [ECG] [EKG]: Secondary | ICD-10-CM | POA: Diagnosis not present

## 2023-07-16 DIAGNOSIS — D696 Thrombocytopenia, unspecified: Secondary | ICD-10-CM | POA: Diagnosis not present

## 2023-07-16 DIAGNOSIS — Z6830 Body mass index (BMI) 30.0-30.9, adult: Secondary | ICD-10-CM

## 2023-07-16 DIAGNOSIS — K219 Gastro-esophageal reflux disease without esophagitis: Secondary | ICD-10-CM | POA: Diagnosis present

## 2023-07-16 DIAGNOSIS — R6521 Severe sepsis with septic shock: Secondary | ICD-10-CM | POA: Diagnosis not present

## 2023-07-16 DIAGNOSIS — K269 Duodenal ulcer, unspecified as acute or chronic, without hemorrhage or perforation: Secondary | ICD-10-CM | POA: Diagnosis not present

## 2023-07-16 DIAGNOSIS — I130 Hypertensive heart and chronic kidney disease with heart failure and stage 1 through stage 4 chronic kidney disease, or unspecified chronic kidney disease: Secondary | ICD-10-CM | POA: Diagnosis present

## 2023-07-16 DIAGNOSIS — G9341 Metabolic encephalopathy: Secondary | ICD-10-CM | POA: Diagnosis present

## 2023-07-16 DIAGNOSIS — R195 Other fecal abnormalities: Secondary | ICD-10-CM | POA: Diagnosis not present

## 2023-07-16 DIAGNOSIS — D689 Coagulation defect, unspecified: Secondary | ICD-10-CM | POA: Diagnosis present

## 2023-07-16 DIAGNOSIS — Z89421 Acquired absence of other right toe(s): Secondary | ICD-10-CM

## 2023-07-16 DIAGNOSIS — N179 Acute kidney failure, unspecified: Secondary | ICD-10-CM | POA: Diagnosis not present

## 2023-07-16 DIAGNOSIS — K254 Chronic or unspecified gastric ulcer with hemorrhage: Secondary | ICD-10-CM | POA: Diagnosis present

## 2023-07-16 DIAGNOSIS — T39395A Adverse effect of other nonsteroidal anti-inflammatory drugs [NSAID], initial encounter: Secondary | ICD-10-CM | POA: Diagnosis present

## 2023-07-16 DIAGNOSIS — Z79899 Other long term (current) drug therapy: Secondary | ICD-10-CM

## 2023-07-16 DIAGNOSIS — Z8489 Family history of other specified conditions: Secondary | ICD-10-CM

## 2023-07-16 DIAGNOSIS — J9601 Acute respiratory failure with hypoxia: Secondary | ICD-10-CM | POA: Diagnosis not present

## 2023-07-16 DIAGNOSIS — R52 Pain, unspecified: Secondary | ICD-10-CM

## 2023-07-16 DIAGNOSIS — Z23 Encounter for immunization: Secondary | ICD-10-CM | POA: Diagnosis present

## 2023-07-16 DIAGNOSIS — I480 Paroxysmal atrial fibrillation: Secondary | ICD-10-CM | POA: Diagnosis present

## 2023-07-16 DIAGNOSIS — Z7189 Other specified counseling: Secondary | ICD-10-CM | POA: Diagnosis not present

## 2023-07-16 DIAGNOSIS — D61818 Other pancytopenia: Secondary | ICD-10-CM | POA: Diagnosis present

## 2023-07-16 DIAGNOSIS — S8012XA Contusion of left lower leg, initial encounter: Secondary | ICD-10-CM | POA: Diagnosis present

## 2023-07-16 DIAGNOSIS — D500 Iron deficiency anemia secondary to blood loss (chronic): Secondary | ICD-10-CM | POA: Diagnosis not present

## 2023-07-16 DIAGNOSIS — R54 Age-related physical debility: Secondary | ICD-10-CM | POA: Diagnosis present

## 2023-07-16 DIAGNOSIS — Y92009 Unspecified place in unspecified non-institutional (private) residence as the place of occurrence of the external cause: Secondary | ICD-10-CM | POA: Diagnosis not present

## 2023-07-16 DIAGNOSIS — Z781 Physical restraint status: Secondary | ICD-10-CM

## 2023-07-16 DIAGNOSIS — N170 Acute kidney failure with tubular necrosis: Secondary | ICD-10-CM | POA: Diagnosis not present

## 2023-07-16 DIAGNOSIS — F1010 Alcohol abuse, uncomplicated: Secondary | ICD-10-CM | POA: Diagnosis present

## 2023-07-16 DIAGNOSIS — E870 Hyperosmolality and hypernatremia: Secondary | ICD-10-CM | POA: Diagnosis not present

## 2023-07-16 DIAGNOSIS — Z66 Do not resuscitate: Secondary | ICD-10-CM | POA: Diagnosis not present

## 2023-07-16 DIAGNOSIS — N189 Chronic kidney disease, unspecified: Secondary | ICD-10-CM | POA: Diagnosis present

## 2023-07-16 DIAGNOSIS — I517 Cardiomegaly: Secondary | ICD-10-CM | POA: Diagnosis present

## 2023-07-16 DIAGNOSIS — M7989 Other specified soft tissue disorders: Secondary | ICD-10-CM

## 2023-07-16 DIAGNOSIS — F3181 Bipolar II disorder: Secondary | ICD-10-CM | POA: Diagnosis present

## 2023-07-16 DIAGNOSIS — K767 Hepatorenal syndrome: Secondary | ICD-10-CM | POA: Diagnosis present

## 2023-07-16 DIAGNOSIS — I8510 Secondary esophageal varices without bleeding: Secondary | ICD-10-CM | POA: Diagnosis present

## 2023-07-16 DIAGNOSIS — L089 Local infection of the skin and subcutaneous tissue, unspecified: Secondary | ICD-10-CM | POA: Diagnosis present

## 2023-07-16 DIAGNOSIS — R64 Cachexia: Secondary | ICD-10-CM | POA: Diagnosis present

## 2023-07-16 DIAGNOSIS — M25521 Pain in right elbow: Secondary | ICD-10-CM | POA: Diagnosis present

## 2023-07-16 DIAGNOSIS — R278 Other lack of coordination: Secondary | ICD-10-CM | POA: Diagnosis present

## 2023-07-16 DIAGNOSIS — K7210 Chronic hepatic failure without coma: Secondary | ICD-10-CM | POA: Diagnosis present

## 2023-07-16 DIAGNOSIS — W19XXXA Unspecified fall, initial encounter: Secondary | ICD-10-CM | POA: Diagnosis present

## 2023-07-16 DIAGNOSIS — G8929 Other chronic pain: Secondary | ICD-10-CM | POA: Diagnosis present

## 2023-07-16 DIAGNOSIS — K259 Gastric ulcer, unspecified as acute or chronic, without hemorrhage or perforation: Secondary | ICD-10-CM | POA: Diagnosis not present

## 2023-07-16 DIAGNOSIS — I5033 Acute on chronic diastolic (congestive) heart failure: Secondary | ICD-10-CM | POA: Diagnosis not present

## 2023-07-16 DIAGNOSIS — E039 Hypothyroidism, unspecified: Secondary | ICD-10-CM | POA: Diagnosis present

## 2023-07-16 DIAGNOSIS — F10139 Alcohol abuse with withdrawal, unspecified: Secondary | ICD-10-CM | POA: Diagnosis present

## 2023-07-16 DIAGNOSIS — E872 Acidosis, unspecified: Secondary | ICD-10-CM | POA: Diagnosis present

## 2023-07-16 DIAGNOSIS — S7012XA Contusion of left thigh, initial encounter: Secondary | ICD-10-CM | POA: Diagnosis present

## 2023-07-16 DIAGNOSIS — E861 Hypovolemia: Secondary | ICD-10-CM | POA: Diagnosis present

## 2023-07-16 DIAGNOSIS — E669 Obesity, unspecified: Secondary | ICD-10-CM | POA: Diagnosis present

## 2023-07-16 DIAGNOSIS — E875 Hyperkalemia: Secondary | ICD-10-CM | POA: Diagnosis not present

## 2023-07-16 DIAGNOSIS — R1310 Dysphagia, unspecified: Secondary | ICD-10-CM | POA: Diagnosis present

## 2023-07-16 DIAGNOSIS — Z720 Tobacco use: Secondary | ICD-10-CM | POA: Diagnosis present

## 2023-07-16 DIAGNOSIS — I8500 Esophageal varices without bleeding: Secondary | ICD-10-CM | POA: Diagnosis not present

## 2023-07-16 DIAGNOSIS — A419 Sepsis, unspecified organism: Secondary | ICD-10-CM | POA: Diagnosis not present

## 2023-07-16 DIAGNOSIS — K766 Portal hypertension: Secondary | ICD-10-CM | POA: Diagnosis present

## 2023-07-16 DIAGNOSIS — D684 Acquired coagulation factor deficiency: Secondary | ICD-10-CM | POA: Diagnosis present

## 2023-07-16 DIAGNOSIS — I864 Gastric varices: Secondary | ICD-10-CM | POA: Diagnosis present

## 2023-07-16 DIAGNOSIS — I472 Ventricular tachycardia, unspecified: Secondary | ICD-10-CM | POA: Diagnosis not present

## 2023-07-16 DIAGNOSIS — R451 Restlessness and agitation: Secondary | ICD-10-CM | POA: Diagnosis not present

## 2023-07-16 DIAGNOSIS — F17210 Nicotine dependence, cigarettes, uncomplicated: Secondary | ICD-10-CM | POA: Diagnosis present

## 2023-07-16 DIAGNOSIS — I10 Essential (primary) hypertension: Secondary | ICD-10-CM | POA: Diagnosis not present

## 2023-07-16 DIAGNOSIS — E1165 Type 2 diabetes mellitus with hyperglycemia: Secondary | ICD-10-CM | POA: Diagnosis not present

## 2023-07-16 LAB — CBC
HCT: 23 % — ABNORMAL LOW (ref 36.0–46.0)
Hemoglobin: 7.5 g/dL — ABNORMAL LOW (ref 12.0–15.0)
MCH: 30.5 pg (ref 26.0–34.0)
MCHC: 32.6 g/dL (ref 30.0–36.0)
MCV: 93.5 fL (ref 80.0–100.0)
Platelets: 41 10*3/uL — ABNORMAL LOW (ref 150–400)
RBC: 2.46 MIL/uL — ABNORMAL LOW (ref 3.87–5.11)
RDW: 14.8 % (ref 11.5–15.5)
WBC: 5.9 10*3/uL (ref 4.0–10.5)
nRBC: 0 % (ref 0.0–0.2)

## 2023-07-16 LAB — CBC WITH DIFFERENTIAL/PLATELET
Abs Immature Granulocytes: 0.07 10*3/uL (ref 0.00–0.07)
Basophils Absolute: 0 10*3/uL (ref 0.0–0.1)
Basophils Relative: 1 %
Eosinophils Absolute: 0 10*3/uL (ref 0.0–0.5)
Eosinophils Relative: 0 %
HCT: 24.1 % — ABNORMAL LOW (ref 36.0–46.0)
Hemoglobin: 8.2 g/dL — ABNORMAL LOW (ref 12.0–15.0)
Immature Granulocytes: 1 %
Lymphocytes Relative: 8 %
Lymphs Abs: 0.5 10*3/uL — ABNORMAL LOW (ref 0.7–4.0)
MCH: 32.8 pg (ref 26.0–34.0)
MCHC: 34 g/dL (ref 30.0–36.0)
MCV: 96.4 fL (ref 80.0–100.0)
Monocytes Absolute: 0.3 10*3/uL (ref 0.1–1.0)
Monocytes Relative: 6 %
Neutro Abs: 5 10*3/uL (ref 1.7–7.7)
Neutrophils Relative %: 84 %
Platelets: 41 10*3/uL — ABNORMAL LOW (ref 150–400)
RBC: 2.5 MIL/uL — ABNORMAL LOW (ref 3.87–5.11)
RDW: 14.8 % (ref 11.5–15.5)
Smear Review: DECREASED
WBC: 5.9 10*3/uL (ref 4.0–10.5)
nRBC: 0 % (ref 0.0–0.2)

## 2023-07-16 LAB — BASIC METABOLIC PANEL
Anion gap: 16 — ABNORMAL HIGH (ref 5–15)
BUN: 75 mg/dL — ABNORMAL HIGH (ref 8–23)
CO2: 16 mmol/L — ABNORMAL LOW (ref 22–32)
Calcium: 8 mg/dL — ABNORMAL LOW (ref 8.9–10.3)
Chloride: 105 mmol/L (ref 98–111)
Creatinine, Ser: 3.36 mg/dL — ABNORMAL HIGH (ref 0.44–1.00)
GFR, Estimated: 14 mL/min — ABNORMAL LOW (ref >60)
Glucose, Bld: 112 mg/dL — ABNORMAL HIGH (ref 70–99)
Potassium: 5 mmol/L (ref 3.5–5.1)
Sodium: 137 mmol/L (ref 135–145)

## 2023-07-16 LAB — POC OCCULT BLOOD, ED
Fecal Occult Bld: POSITIVE — AB
Fecal Occult Bld: POSITIVE — AB

## 2023-07-16 LAB — RETICULOCYTES
Immature Retic Fract: 9.1 % (ref 2.3–15.9)
RBC.: 2.41 MIL/uL — ABNORMAL LOW (ref 3.87–5.11)
Retic Count, Absolute: 38.6 10*3/uL (ref 19.0–186.0)
Retic Ct Pct: 1.6 % (ref 0.4–3.1)

## 2023-07-16 LAB — PROTIME-INR
INR: 1.7 — ABNORMAL HIGH (ref 0.8–1.2)
Prothrombin Time: 20 s — ABNORMAL HIGH (ref 11.4–15.2)

## 2023-07-16 LAB — AMMONIA: Ammonia: 109 umol/L — ABNORMAL HIGH (ref 9–35)

## 2023-07-16 MED ORDER — TETANUS-DIPHTH-ACELL PERTUSSIS 5-2.5-18.5 LF-MCG/0.5 IM SUSY
0.5000 mL | PREFILLED_SYRINGE | Freq: Once | INTRAMUSCULAR | Status: AC
  Administered 2023-07-16: 0.5 mL via INTRAMUSCULAR
  Filled 2023-07-16: qty 0.5

## 2023-07-16 MED ORDER — THIAMINE HCL 100 MG/ML IJ SOLN
100.0000 mg | INTRAMUSCULAR | Status: DC
  Administered 2023-07-17 – 2023-07-25 (×10): 100 mg via INTRAVENOUS
  Filled 2023-07-16 (×10): qty 2

## 2023-07-16 MED ORDER — NICOTINE 7 MG/24HR TD PT24
7.0000 mg | MEDICATED_PATCH | Freq: Every day | TRANSDERMAL | Status: DC
  Administered 2023-07-17 – 2023-07-29 (×13): 7 mg via TRANSDERMAL
  Filled 2023-07-16 (×14): qty 1

## 2023-07-16 MED ORDER — SODIUM CHLORIDE 0.9 % IV BOLUS
1000.0000 mL | Freq: Once | INTRAVENOUS | Status: AC
  Administered 2023-07-16: 1000 mL via INTRAVENOUS

## 2023-07-16 MED ORDER — PANTOPRAZOLE SODIUM 40 MG IV SOLR
40.0000 mg | Freq: Two times a day (BID) | INTRAVENOUS | Status: DC
  Administered 2023-07-17 (×2): 40 mg via INTRAVENOUS
  Filled 2023-07-16 (×2): qty 10

## 2023-07-16 MED ORDER — ACETAMINOPHEN 500 MG PO TABS
1000.0000 mg | ORAL_TABLET | Freq: Once | ORAL | Status: AC
  Administered 2023-07-16: 1000 mg via ORAL
  Filled 2023-07-16: qty 2

## 2023-07-16 NOTE — Assessment & Plan Note (Addendum)
Unclear if has been taking Lamictal will need to clarify before restarting

## 2023-07-16 NOTE — Assessment & Plan Note (Signed)
Protonix changed to IV.

## 2023-07-16 NOTE — Assessment & Plan Note (Signed)
-  will monitor on tele avoid QT prolonging medications, rehydrate correct electrolytes ? ?

## 2023-07-16 NOTE — Assessment & Plan Note (Signed)
Obtain echo

## 2023-07-16 NOTE — Assessment & Plan Note (Addendum)
Patient states she quit few weeks ago will monitor for any sign of alcohol withdrawal in case patient may have had some inadvertent alcohol use more recent than that

## 2023-07-16 NOTE — ED Provider Notes (Signed)
movement is intact no evidence to suggest ocular entrapment.  No significant midface tenderness.  She also has skin tear noted to her right elbow with decreased elbow range of motion.  Scab noted to her left elbow with full range of motion.  Her  left hand is remarkable for some abrasion noted to her middle finger at the distal phalanx with lateral deviation about the distal phalanx at the joint.  It is nontender to palpation.  Patient has significant ecchymosis noted to her left leg including hip and extending down towards her left lower extremity with associated edema.  She has some faint bruising noted to her abdominal wall on the left side but minimal abdominal tenderness.  Her pelvis is stable.  She is without any significant midline spine tenderness.  Will obtain appropriate imaging for further assessment.  Pain medication given.  Workup initiated.  -Labs ordered, independently viewed and interpreted by me.  Labs remarkable for BUN 75, Cr 3.36 which is markedly impaired from baseline.  Will need IV hydration and admission for AKI.  Plt 41, likely contributing to her extensive bruising.   -The patient was maintained on a cardiac monitor.  I personally viewed and interpreted the cardiac monitored which showed an underlying rhythm of: NSR -Imaging independently viewed and interpreted by me and I agree with radiologist's interpretation.  Result remarkable for xray of R elbow, L hip, L hand, L knee without acute fx.  Since pt is having more trouble ambulating, will obtain Ct of L hip to r/o occult fx. Head/cspine/maxillofacial CT currently pending -This patient presents to the ED for concern of fall, this involves an extensive number of treatment options, and is a complaint that carries with it a high risk of complications and morbidity.  The differential diagnosis includes fx, dislocation, intracranial bleed, contusion, strain, sprain -Co morbidities that complicate the patient evaluation includes hx of alcohol use, bipolar, anxiety, thrombocytopenia -Treatment includes tylenol, IVF -Reevaluation of the patient after these medicines showed that the patient improved -PCP office notes or outside notes reviewed -Discussion with oncoming provider  who will consult for admission once labs and imaging have resulted -Escalation to admission/observation considered: anticipate admission for AKI and injury.    I have also ordered vascular US of LLE as it is moderately edematous.  It is likely 2/2 bruising, however cannot r/o DVT.           Final Clinical Impression(s) / ED Diagnoses Final diagnoses:  None    Rx / DC Orders ED Discharge Orders     None         Fayrene Helper, PA-C 08/01/2023 1513    Arby Barrette, MD 08/01/2023 [phone]  [place] [facility] AT [facility] Provider Note   CSN: [id number] Arrival date & time: 07/30/2023  1321     History  Chief Complaint  Patient presents with   Erika [name]    BHAWANA TKACH is a 67 y.o. female.  The history is provided by the patient and medical records. No language interpreter was used.  Fall     67 year old female with significant history of alcohol abuse, alcohol-related cirrhosis, GERD, depression, arthritis, bipolar, brought here via EMS from home for evaluation of a fall.  Patient report a week ago she was on her way to the says she walked up a curb, missed stepped and fell forward striking her face and body against the ground.  She believes she may have had a brief episode of loss of consciousness.  She did not want to go to the hospital for evaluation.  Throughout the week she endorsed progressive worsening pain primarily to her right elbow and her left hip.  She normally use a cane to walk but for the past few days she has not walked much due to her pain.  She is taking Aleve at home without relief adequately.  She does not endorse any confusion nausea vomiting focal numbness or focal weakness.  She is unsure her last tetanus status.  She denies any recent alcohol use and states last alcohol drink was approximately 2 weeks ago.  She is not on any blood thinner medication.  Home Medications Prior to Admission medications   Medication Sig Start Date End Date Taking? Authorizing Provider  FLUoxetine (PROZAC) 20 MG capsule Take 1 capsule (20 mg total) by mouth daily. 07/08/23   Claiborne Rigg, NP  lamoTRIgine (LAMICTAL) 100 MG tablet Take 1 tablet (100 mg total) by mouth 2 (two) times daily. 07/08/23   Claiborne Rigg, NP  pregabalin (LYRICA) 100 MG capsule Take 1 capsule (100 mg total) by mouth 2 (two) times daily. 07/08/23   Claiborne Rigg, NP  thiamine (VITAMIN B1) 100 MG tablet Take 1 tablet (100 mg total) by mouth daily. 07/08/23   Claiborne Rigg, NP  traZODone (DESYREL) 100 MG tablet Take 1 tablet (100 mg total) by mouth at bedtime. 07/08/23   Claiborne Rigg, NP  venlafaxine XR (EFFEXOR-XR) 150 MG 24 hr capsule Take 1 capsule (150 mg total) by mouth daily with breakfast. 07/08/23   Claiborne Rigg, NP      Allergies    Patient has no known allergies.    Review of Systems   Review of Systems  All other systems reviewed and are negative.   Physical Exam Updated Vital Signs BP (!) 96/50   Pulse 71   Temp 97.7 F (36.5 C) (Oral)   Resp 13   Ht 5\' 1"  (1.549 m)   Wt 65.8 kg   SpO2 99%   BMI 27.40 kg/m  Physical Exam Vitals and nursing note reviewed.  Constitutional:      General: She is not in acute distress.    Appearance: She is well-developed.  HENT:     Head: Normocephalic.     Comments: Ecchymosis noted to left lower region with surrounding faint bruising around the left temporal region.  It is tender to palpation.  Extraocular movements intact and no pain with eye movement. Eyes:     Conjunctiva/sclera: Conjunctivae normal.  Cardiovascular:     Rate and Rhythm: Normal rate and regular rhythm.     Pulses: Normal pulses.  [place] [facility] AT [facility] Provider Note   CSN: [id number] Arrival date & time: 07/30/2023  1321     History  Chief Complaint  Patient presents with   Erika [name]    BHAWANA TKACH is a 67 y.o. female.  The history is provided by the patient and medical records. No language interpreter was used.  Fall     67 year old female with significant history of alcohol abuse, alcohol-related cirrhosis, GERD, depression, arthritis, bipolar, brought here via EMS from home for evaluation of a fall.  Patient report a week ago she was on her way to the says she walked up a curb, missed stepped and fell forward striking her face and body against the ground.  She believes she may have had a brief episode of loss of consciousness.  She did not want to go to the hospital for evaluation.  Throughout the week she endorsed progressive worsening pain primarily to her right elbow and her left hip.  She normally use a cane to walk but for the past few days she has not walked much due to her pain.  She is taking Aleve at home without relief adequately.  She does not endorse any confusion nausea vomiting focal numbness or focal weakness.  She is unsure her last tetanus status.  She denies any recent alcohol use and states last alcohol drink was approximately 2 weeks ago.  She is not on any blood thinner medication.  Home Medications Prior to Admission medications   Medication Sig Start Date End Date Taking? Authorizing Provider  FLUoxetine (PROZAC) 20 MG capsule Take 1 capsule (20 mg total) by mouth daily. 07/08/23   Claiborne Rigg, NP  lamoTRIgine (LAMICTAL) 100 MG tablet Take 1 tablet (100 mg total) by mouth 2 (two) times daily. 07/08/23   Claiborne Rigg, NP  pregabalin (LYRICA) 100 MG capsule Take 1 capsule (100 mg total) by mouth 2 (two) times daily. 07/08/23   Claiborne Rigg, NP  thiamine (VITAMIN B1) 100 MG tablet Take 1 tablet (100 mg total) by mouth daily. 07/08/23   Claiborne Rigg, NP  traZODone (DESYREL) 100 MG tablet Take 1 tablet (100 mg total) by mouth at bedtime. 07/08/23   Claiborne Rigg, NP  venlafaxine XR (EFFEXOR-XR) 150 MG 24 hr capsule Take 1 capsule (150 mg total) by mouth daily with breakfast. 07/08/23   Claiborne Rigg, NP      Allergies    Patient has no known allergies.    Review of Systems   Review of Systems  All other systems reviewed and are negative.   Physical Exam Updated Vital Signs BP (!) 96/50   Pulse 71   Temp 97.7 F (36.5 C) (Oral)   Resp 13   Ht 5\' 1"  (1.549 m)   Wt 65.8 kg   SpO2 99%   BMI 27.40 kg/m  Physical Exam Vitals and nursing note reviewed.  Constitutional:      General: She is not in acute distress.    Appearance: She is well-developed.  HENT:     Head: Normocephalic.     Comments: Ecchymosis noted to left lower region with surrounding faint bruising around the left temporal region.  It is tender to palpation.  Extraocular movements intact and no pain with eye movement. Eyes:     Conjunctiva/sclera: Conjunctivae normal.  Cardiovascular:     Rate and Rhythm: Normal rate and regular rhythm.     Pulses: Normal pulses.  movement is intact no evidence to suggest ocular entrapment.  No significant midface tenderness.  She also has skin tear noted to her right elbow with decreased elbow range of motion.  Scab noted to her left elbow with full range of motion.  Her  left hand is remarkable for some abrasion noted to her middle finger at the distal phalanx with lateral deviation about the distal phalanx at the joint.  It is nontender to palpation.  Patient has significant ecchymosis noted to her left leg including hip and extending down towards her left lower extremity with associated edema.  She has some faint bruising noted to her abdominal wall on the left side but minimal abdominal tenderness.  Her pelvis is stable.  She is without any significant midline spine tenderness.  Will obtain appropriate imaging for further assessment.  Pain medication given.  Workup initiated.  -Labs ordered, independently viewed and interpreted by me.  Labs remarkable for BUN 75, Cr 3.36 which is markedly impaired from baseline.  Will need IV hydration and admission for AKI.  Plt 41, likely contributing to her extensive bruising.   -The patient was maintained on a cardiac monitor.  I personally viewed and interpreted the cardiac monitored which showed an underlying rhythm of: NSR -Imaging independently viewed and interpreted by me and I agree with radiologist's interpretation.  Result remarkable for xray of R elbow, L hip, L hand, L knee without acute fx.  Since pt is having more trouble ambulating, will obtain Ct of L hip to r/o occult fx. Head/cspine/maxillofacial CT currently pending -This patient presents to the ED for concern of fall, this involves an extensive number of treatment options, and is a complaint that carries with it a high risk of complications and morbidity.  The differential diagnosis includes fx, dislocation, intracranial bleed, contusion, strain, sprain -Co morbidities that complicate the patient evaluation includes hx of alcohol use, bipolar, anxiety, thrombocytopenia -Treatment includes tylenol, IVF -Reevaluation of the patient after these medicines showed that the patient improved -PCP office notes or outside notes reviewed -Discussion with oncoming provider  who will consult for admission once labs and imaging have resulted -Escalation to admission/observation considered: anticipate admission for AKI and injury.    I have also ordered vascular US of LLE as it is moderately edematous.  It is likely 2/2 bruising, however cannot r/o DVT.           Final Clinical Impression(s) / ED Diagnoses Final diagnoses:  None    Rx / DC Orders ED Discharge Orders     None         Fayrene Helper, PA-C 08/01/2023 1513    Arby Barrette, MD 08/01/2023 [phone]

## 2023-07-16 NOTE — Assessment & Plan Note (Signed)
Pt endorses dark stools  Given plt of 40 it is possible she may be having some oozing Has never established care with GI  Would benefit from GI eval given severe cirrhosis

## 2023-07-16 NOTE — Plan of Care (Signed)
  Problem: Education: Goal: Knowledge of General Education information will improve Description Including pain rating scale, medication(s)/side effects and non-pharmacologic comfort measures Outcome: Progressing   

## 2023-07-16 NOTE — Subjective & Objective (Signed)
Had a fall last week and landed on the curb on her side, reports hitting her head, not on blood thinners But has history of cirrhosis and thrombocytopenia Has been having increasing bruising of her left leg initial imaging showed no evidence of  intracranial trauma On arrival hypertensive 106/60 Patient have had significant pain trouble ambulating significant pain in her hip Patient noted to have significant edema of her thigh and bruising present

## 2023-07-16 NOTE — Assessment & Plan Note (Addendum)
Chronic but progressively worse.  If there is evidence of acute bleeding would need platelets transfusion at this point it seems that CT scan did not comment on ongoing blood extravasation continue to monitor I expect hemoglobin will drop as patient is being rehydrated Would have low threshold to transfuse and possibly give plt if theres is a suspicion of ongoing bleed Check INR

## 2023-07-16 NOTE — ED Triage Notes (Addendum)
Pt coming in from home via [facility]. [place] on her side. Pt hit her head . Pt has black eye on the left side. Pt denies thinners. Pt son reports loc at the time of the fall. Pt has increasing Bruising on left leg. Pt also has right elbow pain.    Bp 106/60 Pulse 74  Rr 18   Spo2 97 on ra

## 2023-07-16 NOTE — H&P (Signed)
or Wo Pelvis 2-3 Views Left   DG Knee Complete 4 Views Left   DG Hand Complete Left   CT Maxillofacial Wo Contrast   DG CHEST PORT 1 VIEW   CT HIP LEFT WO CONTRAST   Basic metabolic panel   CBC with Differential   Urinalysis, Routine w reflex microscopic -Urine, Clean Catch   Protime-INR   Hepatic function panel   Vitamin B12   Folate   Iron and TIBC   Ferritin   Reticulocytes   Rapid urine drug screen (hospital performed)   Ethanol   Procalcitonin   Vitamin B1   Ammonia   CK   Magnesium   Phosphorus   Protime-INR   Sodium, urine, random   Creatinine, urine, random   Osmolality, urine   Osmolality   TSH   CBC   Comprehensive metabolic panel   Blood gas, venous   Re-check Vital Signs   Cardiac Monitoring - Continuous Indefinite   Consult for Unassigned Medical Admission   POC occult blood, ED RN will collect   POC occult blood, ED   I-Stat CG4 Lactic Acid   EKG 12-Lead   Type and screen [facility]   Admit to Inpatient (patient's expected length of stay will be greater than 2 midnights or inpatient only procedure)   VAS [place] LOWER EXTREMITY VENOUS (DVT) (7a-7p)     OTHER Significant initial  Findings:       Cultures:    Component Value Date/Time   SDES BLOOD LEFT ANTECUBITAL 05/09/2022 1207   SPECREQUEST  05/09/2022 1207    BOTTLES DRAWN AEROBIC ONLY Blood Culture adequate volume   CULT  05/09/2022 1207    NO GROWTH 5 DAYS Performed at [facility], [address], [place], [place] [address]    REPTSTATUS 05/14/2022 FINAL 05/09/2022 1207     Radiological Exams on Admission: VAS [place] LOWER EXTREMITY VENOUS (DVT)  (7a-7p)  Result Date: 07/08/2023  Lower Venous DVT Study Patient Name:  Erika [name]  Date of Exam:   07/10/2023 Medical Rec #: [id number]           Accession #:    [id number] Date of Birth: 17-Aug-1956            Patient Gender: F Patient Age:   67 years Exam Location:  [facility] Procedure:      VAS [place] LOWER EXTREMITY VENOUS (DVT) Referring Phys: Fayrene Helper --------------------------------------------------------------------------------  Indications: Pain, Swelling, and Bruising status post fall onto curb on left hip.  Limitations: Significant edema, especially throughout thigh, clothing, and pain with compression. Comparison Study: No prior study on file Performing Technologist: Sherren Kerns RVS  Examination Guidelines: A complete evaluation includes B-mode imaging, spectral Doppler, color Doppler, and power Doppler as needed of all accessible portions of each vessel. Bilateral testing is considered an integral part of a complete examination. Limited examinations for reoccurring indications may be performed as noted. The reflux portion of the exam is performed with the patient in reverse Trendelenburg.  +-----+---------------+---------+-----------+----------+--------------+ RIGHTCompressibilityPhasicitySpontaneityPropertiesThrombus Aging +-----+---------------+---------+-----------+----------+--------------+ CFV  Full           Yes      Yes                                 +-----+---------------+---------+-----------+----------+--------------+   +---------+---------------+---------+-----------+----------+-------------------+ LEFT     CompressibilityPhasicitySpontaneityPropertiesThrombus Aging      +---------+---------------+---------+-----------+----------+-------------------+ CFV      Full  LEHA HATZ QIO:[id number] DOB: 1956-06-26 DOA: 07/06/2023     PCP: Claiborne Rigg, NP    Patient arrived to ER on 07/04/2023 at 1321 Referred by Attending Therisa Doyne, MD   Patient coming from:    home Lives   With family    Chief Complaint:   Chief Complaint  Patient presents with   Fall    HPI: Erika [name] is a 67 y.o. female with medical history significant of rheumatoid arthritis, cirrhosis secondary to alcohol abuse, bipolar disorder History of portal hypertension, history of from a cytopenia, GERD, hypertension  Presented with swelling left hip  Had a fall last week and landed on the curb on her side, reports hitting her head, not on blood thinners But has history of cirrhosis and thrombocytopenia Has been having increasing bruising of her left leg initial imaging showed no evidence of  intracranial trauma On arrival hypertensive 106/60 Patient have had significant pain trouble ambulating significant pain in her hip Patient noted to have significant edema of her thigh and bruising present   Patient reports that she been having some dark stools and perhaps some diarrhea on occasion she has not been drinking for the past 2 weeks but may have had an occasional beer she just cannot remember how long go states she decreased her smoking and maybe only small cigars a day has been feeling a bit more confused and tired lately  Denies significant ETOH intake  now but heavy in the past Does smoke  but interested in quitting   Lab Results  Component Value Date   SARSCOV2NAA NEGATIVE 07/10/2021   SARSCOV2NAA NEGATIVE 04/23/2021      Regarding pertinent Chronic problems:        Liver disease alcoholic   cirrhosis of the liver   Chronic anemia - baseline hg Hemoglobin & Hematocrit  Recent Labs    05/11/23 1207 07/08/23 1629 07/04/2023 1349  HGB 10.2* 10.7* 8.2*     While in ER: Clinical Course as of 07/23/2023 1911  Sat Jul 16, 2023  1456 Hx EtOH,  quit a few weeks ago. Mechanical fall 1 week ago. Left hip pain, bruising, limited mobility. - AKI - F/u hip CT - Admit [KH]    Clinical Course User Index [KH] Claretha Cooper, DO   Left hip imaging  34 cubic cm subcutaneous hematoma lateral to the left proximal femur.  Pelvic ascites, likely ascribable to the patient's known hepatic cirrhosis.    Lab Orders         Basic metabolic panel         CBC with Differential         Urinalysis, Routine w reflex microscopic -Urine, Clean Catch         Protime-INR         Hepatic function panel         Vitamin B12         Folate         Iron and TIBC         Ferritin         Reticulocytes         Rapid urine drug screen (hospital performed)         Ethanol         Procalcitonin         Vitamin B1         Ammonia         CK  or Wo Pelvis 2-3 Views Left   DG Knee Complete 4 Views Left   DG Hand Complete Left   CT Maxillofacial Wo Contrast   DG CHEST PORT 1 VIEW   CT HIP LEFT WO CONTRAST   Basic metabolic panel   CBC with Differential   Urinalysis, Routine w reflex microscopic -Urine, Clean Catch   Protime-INR   Hepatic function panel   Vitamin B12   Folate   Iron and TIBC   Ferritin   Reticulocytes   Rapid urine drug screen (hospital performed)   Ethanol   Procalcitonin   Vitamin B1   Ammonia   CK   Magnesium   Phosphorus   Protime-INR   Sodium, urine, random   Creatinine, urine, random   Osmolality, urine   Osmolality   TSH   CBC   Comprehensive metabolic panel   Blood gas, venous   Re-check Vital Signs   Cardiac Monitoring - Continuous Indefinite   Consult for Unassigned Medical Admission   POC occult blood, ED RN will collect   POC occult blood, ED   I-Stat CG4 Lactic Acid   EKG 12-Lead   Type and screen [facility]   Admit to Inpatient (patient's expected length of stay will be greater than 2 midnights or inpatient only procedure)   VAS [place] LOWER EXTREMITY VENOUS (DVT) (7a-7p)     OTHER Significant initial  Findings:       Cultures:    Component Value Date/Time   SDES BLOOD LEFT ANTECUBITAL 05/09/2022 1207   SPECREQUEST  05/09/2022 1207    BOTTLES DRAWN AEROBIC ONLY Blood Culture adequate volume   CULT  05/09/2022 1207    NO GROWTH 5 DAYS Performed at [facility], [address], [place], [place] [address]    REPTSTATUS 05/14/2022 FINAL 05/09/2022 1207     Radiological Exams on Admission: VAS [place] LOWER EXTREMITY VENOUS (DVT)  (7a-7p)  Result Date: 07/07/2023  Lower Venous DVT Study Patient Name:  Erika [name]  Date of Exam:   07/27/2023 Medical Rec #: [id number]           Accession #:    [id number] Date of Birth: 17-Aug-1956            Patient Gender: F Patient Age:   67 years Exam Location:  [facility] Procedure:      VAS [place] LOWER EXTREMITY VENOUS (DVT) Referring Phys: Fayrene Helper --------------------------------------------------------------------------------  Indications: Pain, Swelling, and Bruising status post fall onto curb on left hip.  Limitations: Significant edema, especially throughout thigh, clothing, and pain with compression. Comparison Study: No prior study on file Performing Technologist: Sherren Kerns RVS  Examination Guidelines: A complete evaluation includes B-mode imaging, spectral Doppler, color Doppler, and power Doppler as needed of all accessible portions of each vessel. Bilateral testing is considered an integral part of a complete examination. Limited examinations for reoccurring indications may be performed as noted. The reflux portion of the exam is performed with the patient in reverse Trendelenburg.  +-----+---------------+---------+-----------+----------+--------------+ RIGHTCompressibilityPhasicitySpontaneityPropertiesThrombus Aging +-----+---------------+---------+-----------+----------+--------------+ CFV  Full           Yes      Yes                                 +-----+---------------+---------+-----------+----------+--------------+   +---------+---------------+---------+-----------+----------+-------------------+ LEFT     CompressibilityPhasicitySpontaneityPropertiesThrombus Aging      +---------+---------------+---------+-----------+----------+-------------------+ CFV      Full  LEHA HATZ QIO:[id number] DOB: 1956-06-26 DOA: 07/28/2023     PCP: Claiborne Rigg, NP    Patient arrived to ER on 07/05/2023 at 1321 Referred by Attending Therisa Doyne, MD   Patient coming from:    home Lives   With family    Chief Complaint:   Chief Complaint  Patient presents with   Fall    HPI: Erika [name] is a 67 y.o. female with medical history significant of rheumatoid arthritis, cirrhosis secondary to alcohol abuse, bipolar disorder History of portal hypertension, history of from a cytopenia, GERD, hypertension  Presented with swelling left hip  Had a fall last week and landed on the curb on her side, reports hitting her head, not on blood thinners But has history of cirrhosis and thrombocytopenia Has been having increasing bruising of her left leg initial imaging showed no evidence of  intracranial trauma On arrival hypertensive 106/60 Patient have had significant pain trouble ambulating significant pain in her hip Patient noted to have significant edema of her thigh and bruising present   Patient reports that she been having some dark stools and perhaps some diarrhea on occasion she has not been drinking for the past 2 weeks but may have had an occasional beer she just cannot remember how long go states she decreased her smoking and maybe only small cigars a day has been feeling a bit more confused and tired lately  Denies significant ETOH intake  now but heavy in the past Does smoke  but interested in quitting   Lab Results  Component Value Date   SARSCOV2NAA NEGATIVE 07/10/2021   SARSCOV2NAA NEGATIVE 04/23/2021      Regarding pertinent Chronic problems:        Liver disease alcoholic   cirrhosis of the liver   Chronic anemia - baseline hg Hemoglobin & Hematocrit  Recent Labs    05/11/23 1207 07/08/23 1629 07/25/2023 1349  HGB 10.2* 10.7* 8.2*     While in ER: Clinical Course as of 07/17/2023 1911  Sat Jul 16, 2023  1456 Hx EtOH,  quit a few weeks ago. Mechanical fall 1 week ago. Left hip pain, bruising, limited mobility. - AKI - F/u hip CT - Admit [KH]    Clinical Course User Index [KH] Claretha Cooper, DO   Left hip imaging  34 cubic cm subcutaneous hematoma lateral to the left proximal femur.  Pelvic ascites, likely ascribable to the patient's known hepatic cirrhosis.    Lab Orders         Basic metabolic panel         CBC with Differential         Urinalysis, Routine w reflex microscopic -Urine, Clean Catch         Protime-INR         Hepatic function panel         Vitamin B12         Folate         Iron and TIBC         Ferritin         Reticulocytes         Rapid urine drug screen (hospital performed)         Ethanol         Procalcitonin         Vitamin B1         Ammonia         CK  or Wo Pelvis 2-3 Views Left   DG Knee Complete 4 Views Left   DG Hand Complete Left   CT Maxillofacial Wo Contrast   DG CHEST PORT 1 VIEW   CT HIP LEFT WO CONTRAST   Basic metabolic panel   CBC with Differential   Urinalysis, Routine w reflex microscopic -Urine, Clean Catch   Protime-INR   Hepatic function panel   Vitamin B12   Folate   Iron and TIBC   Ferritin   Reticulocytes   Rapid urine drug screen (hospital performed)   Ethanol   Procalcitonin   Vitamin B1   Ammonia   CK   Magnesium   Phosphorus   Protime-INR   Sodium, urine, random   Creatinine, urine, random   Osmolality, urine   Osmolality   TSH   CBC   Comprehensive metabolic panel   Blood gas, venous   Re-check Vital Signs   Cardiac Monitoring - Continuous Indefinite   Consult for Unassigned Medical Admission   POC occult blood, ED RN will collect   POC occult blood, ED   I-Stat CG4 Lactic Acid   EKG 12-Lead   Type and screen [facility]   Admit to Inpatient (patient's expected length of stay will be greater than 2 midnights or inpatient only procedure)   VAS [place] LOWER EXTREMITY VENOUS (DVT) (7a-7p)     OTHER Significant initial  Findings:       Cultures:    Component Value Date/Time   SDES BLOOD LEFT ANTECUBITAL 05/09/2022 1207   SPECREQUEST  05/09/2022 1207    BOTTLES DRAWN AEROBIC ONLY Blood Culture adequate volume   CULT  05/09/2022 1207    NO GROWTH 5 DAYS Performed at [facility], [address], [place], [place] [address]    REPTSTATUS 05/14/2022 FINAL 05/09/2022 1207     Radiological Exams on Admission: VAS [place] LOWER EXTREMITY VENOUS (DVT)  (7a-7p)  Result Date: 07/07/2023  Lower Venous DVT Study Patient Name:  Erika [name]  Date of Exam:   07/27/2023 Medical Rec #: [id number]           Accession #:    [id number] Date of Birth: 17-Aug-1956            Patient Gender: F Patient Age:   67 years Exam Location:  [facility] Procedure:      VAS [place] LOWER EXTREMITY VENOUS (DVT) Referring Phys: Fayrene Helper --------------------------------------------------------------------------------  Indications: Pain, Swelling, and Bruising status post fall onto curb on left hip.  Limitations: Significant edema, especially throughout thigh, clothing, and pain with compression. Comparison Study: No prior study on file Performing Technologist: Sherren Kerns RVS  Examination Guidelines: A complete evaluation includes B-mode imaging, spectral Doppler, color Doppler, and power Doppler as needed of all accessible portions of each vessel. Bilateral testing is considered an integral part of a complete examination. Limited examinations for reoccurring indications may be performed as noted. The reflux portion of the exam is performed with the patient in reverse Trendelenburg.  +-----+---------------+---------+-----------+----------+--------------+ RIGHTCompressibilityPhasicitySpontaneityPropertiesThrombus Aging +-----+---------------+---------+-----------+----------+--------------+ CFV  Full           Yes      Yes                                 +-----+---------------+---------+-----------+----------+--------------+   +---------+---------------+---------+-----------+----------+-------------------+ LEFT     CompressibilityPhasicitySpontaneityPropertiesThrombus Aging      +---------+---------------+---------+-----------+----------+-------------------+ CFV      Full  or Wo Pelvis 2-3 Views Left   DG Knee Complete 4 Views Left   DG Hand Complete Left   CT Maxillofacial Wo Contrast   DG CHEST PORT 1 VIEW   CT HIP LEFT WO CONTRAST   Basic metabolic panel   CBC with Differential   Urinalysis, Routine w reflex microscopic -Urine, Clean Catch   Protime-INR   Hepatic function panel   Vitamin B12   Folate   Iron and TIBC   Ferritin   Reticulocytes   Rapid urine drug screen (hospital performed)   Ethanol   Procalcitonin   Vitamin B1   Ammonia   CK   Magnesium   Phosphorus   Protime-INR   Sodium, urine, random   Creatinine, urine, random   Osmolality, urine   Osmolality   TSH   CBC   Comprehensive metabolic panel   Blood gas, venous   Re-check Vital Signs   Cardiac Monitoring - Continuous Indefinite   Consult for Unassigned Medical Admission   POC occult blood, ED RN will collect   POC occult blood, ED   I-Stat CG4 Lactic Acid   EKG 12-Lead   Type and screen [facility]   Admit to Inpatient (patient's expected length of stay will be greater than 2 midnights or inpatient only procedure)   VAS [place] LOWER EXTREMITY VENOUS (DVT) (7a-7p)     OTHER Significant initial  Findings:       Cultures:    Component Value Date/Time   SDES BLOOD LEFT ANTECUBITAL 05/09/2022 1207   SPECREQUEST  05/09/2022 1207    BOTTLES DRAWN AEROBIC ONLY Blood Culture adequate volume   CULT  05/09/2022 1207    NO GROWTH 5 DAYS Performed at [facility], [address], [place], [place] [address]    REPTSTATUS 05/14/2022 FINAL 05/09/2022 1207     Radiological Exams on Admission: VAS [place] LOWER EXTREMITY VENOUS (DVT)  (7a-7p)  Result Date: 07/07/2023  Lower Venous DVT Study Patient Name:  Erika [name]  Date of Exam:   07/27/2023 Medical Rec #: [id number]           Accession #:    [id number] Date of Birth: 17-Aug-1956            Patient Gender: F Patient Age:   67 years Exam Location:  [facility] Procedure:      VAS [place] LOWER EXTREMITY VENOUS (DVT) Referring Phys: Fayrene Helper --------------------------------------------------------------------------------  Indications: Pain, Swelling, and Bruising status post fall onto curb on left hip.  Limitations: Significant edema, especially throughout thigh, clothing, and pain with compression. Comparison Study: No prior study on file Performing Technologist: Sherren Kerns RVS  Examination Guidelines: A complete evaluation includes B-mode imaging, spectral Doppler, color Doppler, and power Doppler as needed of all accessible portions of each vessel. Bilateral testing is considered an integral part of a complete examination. Limited examinations for reoccurring indications may be performed as noted. The reflux portion of the exam is performed with the patient in reverse Trendelenburg.  +-----+---------------+---------+-----------+----------+--------------+ RIGHTCompressibilityPhasicitySpontaneityPropertiesThrombus Aging +-----+---------------+---------+-----------+----------+--------------+ CFV  Full           Yes      Yes                                 +-----+---------------+---------+-----------+----------+--------------+   +---------+---------------+---------+-----------+----------+-------------------+ LEFT     CompressibilityPhasicitySpontaneityPropertiesThrombus Aging      +---------+---------------+---------+-----------+----------+-------------------+ CFV      Full  or Wo Pelvis 2-3 Views Left   DG Knee Complete 4 Views Left   DG Hand Complete Left   CT Maxillofacial Wo Contrast   DG CHEST PORT 1 VIEW   CT HIP LEFT WO CONTRAST   Basic metabolic panel   CBC with Differential   Urinalysis, Routine w reflex microscopic -Urine, Clean Catch   Protime-INR   Hepatic function panel   Vitamin B12   Folate   Iron and TIBC   Ferritin   Reticulocytes   Rapid urine drug screen (hospital performed)   Ethanol   Procalcitonin   Vitamin B1   Ammonia   CK   Magnesium   Phosphorus   Protime-INR   Sodium, urine, random   Creatinine, urine, random   Osmolality, urine   Osmolality   TSH   CBC   Comprehensive metabolic panel   Blood gas, venous   Re-check Vital Signs   Cardiac Monitoring - Continuous Indefinite   Consult for Unassigned Medical Admission   POC occult blood, ED RN will collect   POC occult blood, ED   I-Stat CG4 Lactic Acid   EKG 12-Lead   Type and screen [facility]   Admit to Inpatient (patient's expected length of stay will be greater than 2 midnights or inpatient only procedure)   VAS [place] LOWER EXTREMITY VENOUS (DVT) (7a-7p)     OTHER Significant initial  Findings:       Cultures:    Component Value Date/Time   SDES BLOOD LEFT ANTECUBITAL 05/09/2022 1207   SPECREQUEST  05/09/2022 1207    BOTTLES DRAWN AEROBIC ONLY Blood Culture adequate volume   CULT  05/09/2022 1207    NO GROWTH 5 DAYS Performed at [facility], [address], [place], [place] [address]    REPTSTATUS 05/14/2022 FINAL 05/09/2022 1207     Radiological Exams on Admission: VAS [place] LOWER EXTREMITY VENOUS (DVT)  (7a-7p)  Result Date: 07/08/2023  Lower Venous DVT Study Patient Name:  Erika [name]  Date of Exam:   07/10/2023 Medical Rec #: [id number]           Accession #:    [id number] Date of Birth: 17-Aug-1956            Patient Gender: F Patient Age:   67 years Exam Location:  [facility] Procedure:      VAS [place] LOWER EXTREMITY VENOUS (DVT) Referring Phys: Fayrene Helper --------------------------------------------------------------------------------  Indications: Pain, Swelling, and Bruising status post fall onto curb on left hip.  Limitations: Significant edema, especially throughout thigh, clothing, and pain with compression. Comparison Study: No prior study on file Performing Technologist: Sherren Kerns RVS  Examination Guidelines: A complete evaluation includes B-mode imaging, spectral Doppler, color Doppler, and power Doppler as needed of all accessible portions of each vessel. Bilateral testing is considered an integral part of a complete examination. Limited examinations for reoccurring indications may be performed as noted. The reflux portion of the exam is performed with the patient in reverse Trendelenburg.  +-----+---------------+---------+-----------+----------+--------------+ RIGHTCompressibilityPhasicitySpontaneityPropertiesThrombus Aging +-----+---------------+---------+-----------+----------+--------------+ CFV  Full           Yes      Yes                                 +-----+---------------+---------+-----------+----------+--------------+   +---------+---------------+---------+-----------+----------+-------------------+ LEFT     CompressibilityPhasicitySpontaneityPropertiesThrombus Aging      +---------+---------------+---------+-----------+----------+-------------------+ CFV      Full  LEHA HATZ QIO:[id number] DOB: 1956-06-26 DOA: 07/06/2023     PCP: Claiborne Rigg, NP    Patient arrived to ER on 07/04/2023 at 1321 Referred by Attending Therisa Doyne, MD   Patient coming from:    home Lives   With family    Chief Complaint:   Chief Complaint  Patient presents with   Fall    HPI: Erika [name] is a 67 y.o. female with medical history significant of rheumatoid arthritis, cirrhosis secondary to alcohol abuse, bipolar disorder History of portal hypertension, history of from a cytopenia, GERD, hypertension  Presented with swelling left hip  Had a fall last week and landed on the curb on her side, reports hitting her head, not on blood thinners But has history of cirrhosis and thrombocytopenia Has been having increasing bruising of her left leg initial imaging showed no evidence of  intracranial trauma On arrival hypertensive 106/60 Patient have had significant pain trouble ambulating significant pain in her hip Patient noted to have significant edema of her thigh and bruising present   Patient reports that she been having some dark stools and perhaps some diarrhea on occasion she has not been drinking for the past 2 weeks but may have had an occasional beer she just cannot remember how long go states she decreased her smoking and maybe only small cigars a day has been feeling a bit more confused and tired lately  Denies significant ETOH intake  now but heavy in the past Does smoke  but interested in quitting   Lab Results  Component Value Date   SARSCOV2NAA NEGATIVE 07/10/2021   SARSCOV2NAA NEGATIVE 04/23/2021      Regarding pertinent Chronic problems:        Liver disease alcoholic   cirrhosis of the liver   Chronic anemia - baseline hg Hemoglobin & Hematocrit  Recent Labs    05/11/23 1207 07/08/23 1629 07/04/2023 1349  HGB 10.2* 10.7* 8.2*     While in ER: Clinical Course as of 07/23/2023 1911  Sat Jul 16, 2023  1456 Hx EtOH,  quit a few weeks ago. Mechanical fall 1 week ago. Left hip pain, bruising, limited mobility. - AKI - F/u hip CT - Admit [KH]    Clinical Course User Index [KH] Claretha Cooper, DO   Left hip imaging  34 cubic cm subcutaneous hematoma lateral to the left proximal femur.  Pelvic ascites, likely ascribable to the patient's known hepatic cirrhosis.    Lab Orders         Basic metabolic panel         CBC with Differential         Urinalysis, Routine w reflex microscopic -Urine, Clean Catch         Protime-INR         Hepatic function panel         Vitamin B12         Folate         Iron and TIBC         Ferritin         Reticulocytes         Rapid urine drug screen (hospital performed)         Ethanol         Procalcitonin         Vitamin B1         Ammonia         CK  LEHA HATZ QIO:[id number] DOB: 1956-06-26 DOA: 07/28/2023     PCP: Claiborne Rigg, NP    Patient arrived to ER on 07/05/2023 at 1321 Referred by Attending Therisa Doyne, MD   Patient coming from:    home Lives   With family    Chief Complaint:   Chief Complaint  Patient presents with   Fall    HPI: Erika [name] is a 67 y.o. female with medical history significant of rheumatoid arthritis, cirrhosis secondary to alcohol abuse, bipolar disorder History of portal hypertension, history of from a cytopenia, GERD, hypertension  Presented with swelling left hip  Had a fall last week and landed on the curb on her side, reports hitting her head, not on blood thinners But has history of cirrhosis and thrombocytopenia Has been having increasing bruising of her left leg initial imaging showed no evidence of  intracranial trauma On arrival hypertensive 106/60 Patient have had significant pain trouble ambulating significant pain in her hip Patient noted to have significant edema of her thigh and bruising present   Patient reports that she been having some dark stools and perhaps some diarrhea on occasion she has not been drinking for the past 2 weeks but may have had an occasional beer she just cannot remember how long go states she decreased her smoking and maybe only small cigars a day has been feeling a bit more confused and tired lately  Denies significant ETOH intake  now but heavy in the past Does smoke  but interested in quitting   Lab Results  Component Value Date   SARSCOV2NAA NEGATIVE 07/10/2021   SARSCOV2NAA NEGATIVE 04/23/2021      Regarding pertinent Chronic problems:        Liver disease alcoholic   cirrhosis of the liver   Chronic anemia - baseline hg Hemoglobin & Hematocrit  Recent Labs    05/11/23 1207 07/08/23 1629 07/25/2023 1349  HGB 10.2* 10.7* 8.2*     While in ER: Clinical Course as of 07/17/2023 1911  Sat Jul 16, 2023  1456 Hx EtOH,  quit a few weeks ago. Mechanical fall 1 week ago. Left hip pain, bruising, limited mobility. - AKI - F/u hip CT - Admit [KH]    Clinical Course User Index [KH] Claretha Cooper, DO   Left hip imaging  34 cubic cm subcutaneous hematoma lateral to the left proximal femur.  Pelvic ascites, likely ascribable to the patient's known hepatic cirrhosis.    Lab Orders         Basic metabolic panel         CBC with Differential         Urinalysis, Routine w reflex microscopic -Urine, Clean Catch         Protime-INR         Hepatic function panel         Vitamin B12         Folate         Iron and TIBC         Ferritin         Reticulocytes         Rapid urine drug screen (hospital performed)         Ethanol         Procalcitonin         Vitamin B1         Ammonia         CK  or Wo Pelvis 2-3 Views Left   DG Knee Complete 4 Views Left   DG Hand Complete Left   CT Maxillofacial Wo Contrast   DG CHEST PORT 1 VIEW   CT HIP LEFT WO CONTRAST   Basic metabolic panel   CBC with Differential   Urinalysis, Routine w reflex microscopic -Urine, Clean Catch   Protime-INR   Hepatic function panel   Vitamin B12   Folate   Iron and TIBC   Ferritin   Reticulocytes   Rapid urine drug screen (hospital performed)   Ethanol   Procalcitonin   Vitamin B1   Ammonia   CK   Magnesium   Phosphorus   Protime-INR   Sodium, urine, random   Creatinine, urine, random   Osmolality, urine   Osmolality   TSH   CBC   Comprehensive metabolic panel   Blood gas, venous   Re-check Vital Signs   Cardiac Monitoring - Continuous Indefinite   Consult for Unassigned Medical Admission   POC occult blood, ED RN will collect   POC occult blood, ED   I-Stat CG4 Lactic Acid   EKG 12-Lead   Type and screen [facility]   Admit to Inpatient (patient's expected length of stay will be greater than 2 midnights or inpatient only procedure)   VAS [place] LOWER EXTREMITY VENOUS (DVT) (7a-7p)     OTHER Significant initial  Findings:       Cultures:    Component Value Date/Time   SDES BLOOD LEFT ANTECUBITAL 05/09/2022 1207   SPECREQUEST  05/09/2022 1207    BOTTLES DRAWN AEROBIC ONLY Blood Culture adequate volume   CULT  05/09/2022 1207    NO GROWTH 5 DAYS Performed at [facility], [address], [place], [place] [address]    REPTSTATUS 05/14/2022 FINAL 05/09/2022 1207     Radiological Exams on Admission: VAS [place] LOWER EXTREMITY VENOUS (DVT)  (7a-7p)  Result Date: 07/07/2023  Lower Venous DVT Study Patient Name:  Erika [name]  Date of Exam:   07/27/2023 Medical Rec #: [id number]           Accession #:    [id number] Date of Birth: 17-Aug-1956            Patient Gender: F Patient Age:   67 years Exam Location:  [facility] Procedure:      VAS [place] LOWER EXTREMITY VENOUS (DVT) Referring Phys: Fayrene Helper --------------------------------------------------------------------------------  Indications: Pain, Swelling, and Bruising status post fall onto curb on left hip.  Limitations: Significant edema, especially throughout thigh, clothing, and pain with compression. Comparison Study: No prior study on file Performing Technologist: Sherren Kerns RVS  Examination Guidelines: A complete evaluation includes B-mode imaging, spectral Doppler, color Doppler, and power Doppler as needed of all accessible portions of each vessel. Bilateral testing is considered an integral part of a complete examination. Limited examinations for reoccurring indications may be performed as noted. The reflux portion of the exam is performed with the patient in reverse Trendelenburg.  +-----+---------------+---------+-----------+----------+--------------+ RIGHTCompressibilityPhasicitySpontaneityPropertiesThrombus Aging +-----+---------------+---------+-----------+----------+--------------+ CFV  Full           Yes      Yes                                 +-----+---------------+---------+-----------+----------+--------------+   +---------+---------------+---------+-----------+----------+-------------------+ LEFT     CompressibilityPhasicitySpontaneityPropertiesThrombus Aging      +---------+---------------+---------+-----------+----------+-------------------+ CFV      Full  or Wo Pelvis 2-3 Views Left   DG Knee Complete 4 Views Left   DG Hand Complete Left   CT Maxillofacial Wo Contrast   DG CHEST PORT 1 VIEW   CT HIP LEFT WO CONTRAST   Basic metabolic panel   CBC with Differential   Urinalysis, Routine w reflex microscopic -Urine, Clean Catch   Protime-INR   Hepatic function panel   Vitamin B12   Folate   Iron and TIBC   Ferritin   Reticulocytes   Rapid urine drug screen (hospital performed)   Ethanol   Procalcitonin   Vitamin B1   Ammonia   CK   Magnesium   Phosphorus   Protime-INR   Sodium, urine, random   Creatinine, urine, random   Osmolality, urine   Osmolality   TSH   CBC   Comprehensive metabolic panel   Blood gas, venous   Re-check Vital Signs   Cardiac Monitoring - Continuous Indefinite   Consult for Unassigned Medical Admission   POC occult blood, ED RN will collect   POC occult blood, ED   I-Stat CG4 Lactic Acid   EKG 12-Lead   Type and screen [facility]   Admit to Inpatient (patient's expected length of stay will be greater than 2 midnights or inpatient only procedure)   VAS [place] LOWER EXTREMITY VENOUS (DVT) (7a-7p)     OTHER Significant initial  Findings:       Cultures:    Component Value Date/Time   SDES BLOOD LEFT ANTECUBITAL 05/09/2022 1207   SPECREQUEST  05/09/2022 1207    BOTTLES DRAWN AEROBIC ONLY Blood Culture adequate volume   CULT  05/09/2022 1207    NO GROWTH 5 DAYS Performed at [facility], [address], [place], [place] [address]    REPTSTATUS 05/14/2022 FINAL 05/09/2022 1207     Radiological Exams on Admission: VAS [place] LOWER EXTREMITY VENOUS (DVT)  (7a-7p)  Result Date: 07/08/2023  Lower Venous DVT Study Patient Name:  Erika [name]  Date of Exam:   07/10/2023 Medical Rec #: [id number]           Accession #:    [id number] Date of Birth: 17-Aug-1956            Patient Gender: F Patient Age:   67 years Exam Location:  [facility] Procedure:      VAS [place] LOWER EXTREMITY VENOUS (DVT) Referring Phys: Fayrene Helper --------------------------------------------------------------------------------  Indications: Pain, Swelling, and Bruising status post fall onto curb on left hip.  Limitations: Significant edema, especially throughout thigh, clothing, and pain with compression. Comparison Study: No prior study on file Performing Technologist: Sherren Kerns RVS  Examination Guidelines: A complete evaluation includes B-mode imaging, spectral Doppler, color Doppler, and power Doppler as needed of all accessible portions of each vessel. Bilateral testing is considered an integral part of a complete examination. Limited examinations for reoccurring indications may be performed as noted. The reflux portion of the exam is performed with the patient in reverse Trendelenburg.  +-----+---------------+---------+-----------+----------+--------------+ RIGHTCompressibilityPhasicitySpontaneityPropertiesThrombus Aging +-----+---------------+---------+-----------+----------+--------------+ CFV  Full           Yes      Yes                                 +-----+---------------+---------+-----------+----------+--------------+   +---------+---------------+---------+-----------+----------+-------------------+ LEFT     CompressibilityPhasicitySpontaneityPropertiesThrombus Aging      +---------+---------------+---------+-----------+----------+-------------------+ CFV      Full  or Wo Pelvis 2-3 Views Left   DG Knee Complete 4 Views Left   DG Hand Complete Left   CT Maxillofacial Wo Contrast   DG CHEST PORT 1 VIEW   CT HIP LEFT WO CONTRAST   Basic metabolic panel   CBC with Differential   Urinalysis, Routine w reflex microscopic -Urine, Clean Catch   Protime-INR   Hepatic function panel   Vitamin B12   Folate   Iron and TIBC   Ferritin   Reticulocytes   Rapid urine drug screen (hospital performed)   Ethanol   Procalcitonin   Vitamin B1   Ammonia   CK   Magnesium   Phosphorus   Protime-INR   Sodium, urine, random   Creatinine, urine, random   Osmolality, urine   Osmolality   TSH   CBC   Comprehensive metabolic panel   Blood gas, venous   Re-check Vital Signs   Cardiac Monitoring - Continuous Indefinite   Consult for Unassigned Medical Admission   POC occult blood, ED RN will collect   POC occult blood, ED   I-Stat CG4 Lactic Acid   EKG 12-Lead   Type and screen [facility]   Admit to Inpatient (patient's expected length of stay will be greater than 2 midnights or inpatient only procedure)   VAS [place] LOWER EXTREMITY VENOUS (DVT) (7a-7p)     OTHER Significant initial  Findings:       Cultures:    Component Value Date/Time   SDES BLOOD LEFT ANTECUBITAL 05/09/2022 1207   SPECREQUEST  05/09/2022 1207    BOTTLES DRAWN AEROBIC ONLY Blood Culture adequate volume   CULT  05/09/2022 1207    NO GROWTH 5 DAYS Performed at [facility], [address], [place], [place] [address]    REPTSTATUS 05/14/2022 FINAL 05/09/2022 1207     Radiological Exams on Admission: VAS [place] LOWER EXTREMITY VENOUS (DVT)  (7a-7p)  Result Date: 07/07/2023  Lower Venous DVT Study Patient Name:  Erika [name]  Date of Exam:   07/27/2023 Medical Rec #: [id number]           Accession #:    [id number] Date of Birth: 17-Aug-1956            Patient Gender: F Patient Age:   67 years Exam Location:  [facility] Procedure:      VAS [place] LOWER EXTREMITY VENOUS (DVT) Referring Phys: Fayrene Helper --------------------------------------------------------------------------------  Indications: Pain, Swelling, and Bruising status post fall onto curb on left hip.  Limitations: Significant edema, especially throughout thigh, clothing, and pain with compression. Comparison Study: No prior study on file Performing Technologist: Sherren Kerns RVS  Examination Guidelines: A complete evaluation includes B-mode imaging, spectral Doppler, color Doppler, and power Doppler as needed of all accessible portions of each vessel. Bilateral testing is considered an integral part of a complete examination. Limited examinations for reoccurring indications may be performed as noted. The reflux portion of the exam is performed with the patient in reverse Trendelenburg.  +-----+---------------+---------+-----------+----------+--------------+ RIGHTCompressibilityPhasicitySpontaneityPropertiesThrombus Aging +-----+---------------+---------+-----------+----------+--------------+ CFV  Full           Yes      Yes                                 +-----+---------------+---------+-----------+----------+--------------+   +---------+---------------+---------+-----------+----------+-------------------+ LEFT     CompressibilityPhasicitySpontaneityPropertiesThrombus Aging      +---------+---------------+---------+-----------+----------+-------------------+ CFV      Full

## 2023-07-16 NOTE — Assessment & Plan Note (Signed)
-  Spoke about importance of quitting spent 5 minutes discussing options for treatment, prior attempts at quitting, and dangers of smoking  -At this point patient is     interested in quitting  - order nicotine patch   - nursing tobacco cessation protocol

## 2023-07-16 NOTE — Progress Notes (Signed)
VASCULAR LAB    Left lower extremity venous duplex has been performed.  See CV proc for preliminary results.  Messaged negative results to Dr. Denton Lank via secure chat   Sherren Kerns, RVT 07/12/2023, 5:18 PM

## 2023-07-16 NOTE — Assessment & Plan Note (Signed)
Chronic stable ?

## 2023-07-16 NOTE — ED Notes (Signed)
RN collected POC Occult. Occult card was positive. Results transferring to chart now.

## 2023-07-16 NOTE — Assessment & Plan Note (Signed)
Hemoglobin down from 10.7 just few days ago down to 8.2  We will repeat likely need blood transfusion if noticed significant drop Although patient is Hemoccult positive more likely source of blood loss is significant hematoma of the left hip

## 2023-07-16 NOTE — ED Notes (Signed)
orders placed or performed during the hospital encounter of 07/10/2023 (from the past 48 hour(s))  Basic metabolic panel     Status: Abnormal   Collection Time: 07/15/2023  1:49 PM  Result Value Ref Range   Sodium 137 135 - 145 mmol/L   Potassium 5.0 3.5 - 5.1 mmol/L   Chloride 105 98 - 111 mmol/L   CO2 16 (L) 22 - 32 mmol/L   Glucose, Bld 112 (H) 70 - 99 mg/dL    Comment: Glucose reference range applies only to samples taken after fasting for at least 8 hours.   BUN 75 (H) 8 - 23  mg/dL   Creatinine, Ser 5.78 (H) 0.44 - 1.00 mg/dL   Calcium 8.0 (L) 8.9 - 10.3 mg/dL   GFR, Estimated 14 (L) >60 mL/min    Comment: (NOTE) Calculated using the CKD-EPI Creatinine Equation (2021)    Anion gap 16 (H) 5 - 15    Comment: Performed at [facility], [address], [place], [place] [address]  CBC with Differential     Status: Abnormal   Collection Time: 07/11/2023  1:49 PM  Result Value Ref Range   WBC 5.9 4.0 - 10.5 K/uL   RBC 2.50 (L) 3.87 - 5.11 MIL/uL   Hemoglobin 8.2 (L) 12.0 - 15.0 g/dL   HCT 95.2 (L) 84.1 - 32.4 %   MCV 96.4 80.0 - 100.0 fL   MCH 32.8 26.0 - 34.0 pg   MCHC 34.0 30.0 - 36.0 g/dL   RDW 40.1 02.7 - 25.3 %   Platelets 41 (L) 150 - 400 K/uL    Comment: SPECIMEN CHECKED FOR CLOTS Immature Platelet Fraction may be clinically indicated, consider ordering this additional test [id number] REPEATED TO VERIFY PLATELET COUNT CONFIRMED BY SMEAR    nRBC 0.0 0.0 - 0.2 %   Neutrophils Relative % 84 %   Neutro Abs 5.0 1.7 - 7.7 K/uL   Lymphocytes Relative 8 %   Lymphs Abs 0.5 (L) 0.7 - 4.0 K/uL   Monocytes Relative 6 %   Monocytes Absolute 0.3 0.1 - 1.0 K/uL   Eosinophils Relative 0 %   Eosinophils Absolute 0.0 0.0 - 0.5 K/uL   Basophils Relative 1 %   Basophils Absolute 0.0 0.0 - 0.1 K/uL   WBC Morphology MORPHOLOGY UNREMARKABLE    RBC Morphology MORPHOLOGY UNREMARKABLE    Smear Review PLATELETS APPEAR DECREASED    Immature Granulocytes 1 %   Abs Immature Granulocytes 0.07 0.00 - 0.07 K/uL    Comment: Performed at [facility], [address] [place], [place] [address]  POC occult blood, ED RN will collect     Status: Abnormal   Collection Time: 07/27/2023  4:40 PM  Result Value Ref Range   Fecal Occult Bld POSITIVE (A) NEGATIVE  POC occult blood, ED     Status: Abnormal   Collection Time: 07/17/2023  4:41 PM  Result Value Ref Range   Fecal Occult Bld POSITIVE (A) NEGATIVE   VAS [place] LOWER EXTREMITY VENOUS (DVT) (7a-7p)  Result Date:  07/14/2023  Lower Venous DVT Study Patient Name:  Erika [name]  Date of Exam:   07/13/2023 Medical Rec #: [id number]           Accession #:    [id number] Date of Birth: 03/15/1956            Patient Gender: F Patient Age:   67 years Exam Location:  [facility] Procedure:  orders placed or performed during the hospital encounter of 07/22/2023 (from the past 48 hour(s))  Basic metabolic panel     Status: Abnormal   Collection Time: 07/31/2023  1:49 PM  Result Value Ref Range   Sodium 137 135 - 145 mmol/L   Potassium 5.0 3.5 - 5.1 mmol/L   Chloride 105 98 - 111 mmol/L   CO2 16 (L) 22 - 32 mmol/L   Glucose, Bld 112 (H) 70 - 99 mg/dL    Comment: Glucose reference range applies only to samples taken after fasting for at least 8 hours.   BUN 75 (H) 8 - 23  mg/dL   Creatinine, Ser 5.78 (H) 0.44 - 1.00 mg/dL   Calcium 8.0 (L) 8.9 - 10.3 mg/dL   GFR, Estimated 14 (L) >60 mL/min    Comment: (NOTE) Calculated using the CKD-EPI Creatinine Equation (2021)    Anion gap 16 (H) 5 - 15    Comment: Performed at [facility], [address], [place], [place] [address]  CBC with Differential     Status: Abnormal   Collection Time: 07/17/2023  1:49 PM  Result Value Ref Range   WBC 5.9 4.0 - 10.5 K/uL   RBC 2.50 (L) 3.87 - 5.11 MIL/uL   Hemoglobin 8.2 (L) 12.0 - 15.0 g/dL   HCT 95.2 (L) 84.1 - 32.4 %   MCV 96.4 80.0 - 100.0 fL   MCH 32.8 26.0 - 34.0 pg   MCHC 34.0 30.0 - 36.0 g/dL   RDW 40.1 02.7 - 25.3 %   Platelets 41 (L) 150 - 400 K/uL    Comment: SPECIMEN CHECKED FOR CLOTS Immature Platelet Fraction may be clinically indicated, consider ordering this additional test [id number] REPEATED TO VERIFY PLATELET COUNT CONFIRMED BY SMEAR    nRBC 0.0 0.0 - 0.2 %   Neutrophils Relative % 84 %   Neutro Abs 5.0 1.7 - 7.7 K/uL   Lymphocytes Relative 8 %   Lymphs Abs 0.5 (L) 0.7 - 4.0 K/uL   Monocytes Relative 6 %   Monocytes Absolute 0.3 0.1 - 1.0 K/uL   Eosinophils Relative 0 %   Eosinophils Absolute 0.0 0.0 - 0.5 K/uL   Basophils Relative 1 %   Basophils Absolute 0.0 0.0 - 0.1 K/uL   WBC Morphology MORPHOLOGY UNREMARKABLE    RBC Morphology MORPHOLOGY UNREMARKABLE    Smear Review PLATELETS APPEAR DECREASED    Immature Granulocytes 1 %   Abs Immature Granulocytes 0.07 0.00 - 0.07 K/uL    Comment: Performed at [facility], [address] [place], [place] [address]  POC occult blood, ED RN will collect     Status: Abnormal   Collection Time: 07/09/2023  4:40 PM  Result Value Ref Range   Fecal Occult Bld POSITIVE (A) NEGATIVE  POC occult blood, ED     Status: Abnormal   Collection Time: 07/10/2023  4:41 PM  Result Value Ref Range   Fecal Occult Bld POSITIVE (A) NEGATIVE   VAS [place] LOWER EXTREMITY VENOUS (DVT) (7a-7p)  Result Date:  07/12/2023  Lower Venous DVT Study Patient Name:  Erika [name]  Date of Exam:   07/26/2023 Medical Rec #: [id number]           Accession #:    [id number] Date of Birth: 03/15/1956            Patient Gender: F Patient Age:   67 years Exam Location:  [facility] Procedure:  orders placed or performed during the hospital encounter of 07/09/2023 (from the past 48 hour(s))  Basic metabolic panel     Status: Abnormal   Collection Time: 07/11/2023  1:49 PM  Result Value Ref Range   Sodium 137 135 - 145 mmol/L   Potassium 5.0 3.5 - 5.1 mmol/L   Chloride 105 98 - 111 mmol/L   CO2 16 (L) 22 - 32 mmol/L   Glucose, Bld 112 (H) 70 - 99 mg/dL    Comment: Glucose reference range applies only to samples taken after fasting for at least 8 hours.   BUN 75 (H) 8 - 23  mg/dL   Creatinine, Ser 5.78 (H) 0.44 - 1.00 mg/dL   Calcium 8.0 (L) 8.9 - 10.3 mg/dL   GFR, Estimated 14 (L) >60 mL/min    Comment: (NOTE) Calculated using the CKD-EPI Creatinine Equation (2021)    Anion gap 16 (H) 5 - 15    Comment: Performed at [facility], [address], [place], [place] [address]  CBC with Differential     Status: Abnormal   Collection Time: 07/21/2023  1:49 PM  Result Value Ref Range   WBC 5.9 4.0 - 10.5 K/uL   RBC 2.50 (L) 3.87 - 5.11 MIL/uL   Hemoglobin 8.2 (L) 12.0 - 15.0 g/dL   HCT 95.2 (L) 84.1 - 32.4 %   MCV 96.4 80.0 - 100.0 fL   MCH 32.8 26.0 - 34.0 pg   MCHC 34.0 30.0 - 36.0 g/dL   RDW 40.1 02.7 - 25.3 %   Platelets 41 (L) 150 - 400 K/uL    Comment: SPECIMEN CHECKED FOR CLOTS Immature Platelet Fraction may be clinically indicated, consider ordering this additional test [id number] REPEATED TO VERIFY PLATELET COUNT CONFIRMED BY SMEAR    nRBC 0.0 0.0 - 0.2 %   Neutrophils Relative % 84 %   Neutro Abs 5.0 1.7 - 7.7 K/uL   Lymphocytes Relative 8 %   Lymphs Abs 0.5 (L) 0.7 - 4.0 K/uL   Monocytes Relative 6 %   Monocytes Absolute 0.3 0.1 - 1.0 K/uL   Eosinophils Relative 0 %   Eosinophils Absolute 0.0 0.0 - 0.5 K/uL   Basophils Relative 1 %   Basophils Absolute 0.0 0.0 - 0.1 K/uL   WBC Morphology MORPHOLOGY UNREMARKABLE    RBC Morphology MORPHOLOGY UNREMARKABLE    Smear Review PLATELETS APPEAR DECREASED    Immature Granulocytes 1 %   Abs Immature Granulocytes 0.07 0.00 - 0.07 K/uL    Comment: Performed at [facility], [address] [place], [place] [address]  POC occult blood, ED RN will collect     Status: Abnormal   Collection Time: 07/24/2023  4:40 PM  Result Value Ref Range   Fecal Occult Bld POSITIVE (A) NEGATIVE  POC occult blood, ED     Status: Abnormal   Collection Time: 07/23/2023  4:41 PM  Result Value Ref Range   Fecal Occult Bld POSITIVE (A) NEGATIVE   VAS [place] LOWER EXTREMITY VENOUS (DVT) (7a-7p)  Result Date:  07/27/2023  Lower Venous DVT Study Patient Name:  Erika [name]  Date of Exam:   07/07/2023 Medical Rec #: [id number]           Accession #:    [id number] Date of Birth: 03/15/1956            Patient Gender: F Patient Age:   67 years Exam Location:  [facility] Procedure:  orders placed or performed during the hospital encounter of 07/09/2023 (from the past 48 hour(s))  Basic metabolic panel     Status: Abnormal   Collection Time: 07/11/2023  1:49 PM  Result Value Ref Range   Sodium 137 135 - 145 mmol/L   Potassium 5.0 3.5 - 5.1 mmol/L   Chloride 105 98 - 111 mmol/L   CO2 16 (L) 22 - 32 mmol/L   Glucose, Bld 112 (H) 70 - 99 mg/dL    Comment: Glucose reference range applies only to samples taken after fasting for at least 8 hours.   BUN 75 (H) 8 - 23  mg/dL   Creatinine, Ser 5.78 (H) 0.44 - 1.00 mg/dL   Calcium 8.0 (L) 8.9 - 10.3 mg/dL   GFR, Estimated 14 (L) >60 mL/min    Comment: (NOTE) Calculated using the CKD-EPI Creatinine Equation (2021)    Anion gap 16 (H) 5 - 15    Comment: Performed at [facility], [address], [place], [place] [address]  CBC with Differential     Status: Abnormal   Collection Time: 07/21/2023  1:49 PM  Result Value Ref Range   WBC 5.9 4.0 - 10.5 K/uL   RBC 2.50 (L) 3.87 - 5.11 MIL/uL   Hemoglobin 8.2 (L) 12.0 - 15.0 g/dL   HCT 95.2 (L) 84.1 - 32.4 %   MCV 96.4 80.0 - 100.0 fL   MCH 32.8 26.0 - 34.0 pg   MCHC 34.0 30.0 - 36.0 g/dL   RDW 40.1 02.7 - 25.3 %   Platelets 41 (L) 150 - 400 K/uL    Comment: SPECIMEN CHECKED FOR CLOTS Immature Platelet Fraction may be clinically indicated, consider ordering this additional test [id number] REPEATED TO VERIFY PLATELET COUNT CONFIRMED BY SMEAR    nRBC 0.0 0.0 - 0.2 %   Neutrophils Relative % 84 %   Neutro Abs 5.0 1.7 - 7.7 K/uL   Lymphocytes Relative 8 %   Lymphs Abs 0.5 (L) 0.7 - 4.0 K/uL   Monocytes Relative 6 %   Monocytes Absolute 0.3 0.1 - 1.0 K/uL   Eosinophils Relative 0 %   Eosinophils Absolute 0.0 0.0 - 0.5 K/uL   Basophils Relative 1 %   Basophils Absolute 0.0 0.0 - 0.1 K/uL   WBC Morphology MORPHOLOGY UNREMARKABLE    RBC Morphology MORPHOLOGY UNREMARKABLE    Smear Review PLATELETS APPEAR DECREASED    Immature Granulocytes 1 %   Abs Immature Granulocytes 0.07 0.00 - 0.07 K/uL    Comment: Performed at [facility], [address] [place], [place] [address]  POC occult blood, ED RN will collect     Status: Abnormal   Collection Time: 07/24/2023  4:40 PM  Result Value Ref Range   Fecal Occult Bld POSITIVE (A) NEGATIVE  POC occult blood, ED     Status: Abnormal   Collection Time: 07/23/2023  4:41 PM  Result Value Ref Range   Fecal Occult Bld POSITIVE (A) NEGATIVE   VAS [place] LOWER EXTREMITY VENOUS (DVT) (7a-7p)  Result Date:  07/27/2023  Lower Venous DVT Study Patient Name:  Erika [name]  Date of Exam:   07/07/2023 Medical Rec #: [id number]           Accession #:    [id number] Date of Birth: 03/15/1956            Patient Gender: F Patient Age:   67 years Exam Location:  [facility] Procedure:  orders placed or performed during the hospital encounter of 07/10/2023 (from the past 48 hour(s))  Basic metabolic panel     Status: Abnormal   Collection Time: 07/15/2023  1:49 PM  Result Value Ref Range   Sodium 137 135 - 145 mmol/L   Potassium 5.0 3.5 - 5.1 mmol/L   Chloride 105 98 - 111 mmol/L   CO2 16 (L) 22 - 32 mmol/L   Glucose, Bld 112 (H) 70 - 99 mg/dL    Comment: Glucose reference range applies only to samples taken after fasting for at least 8 hours.   BUN 75 (H) 8 - 23  mg/dL   Creatinine, Ser 5.78 (H) 0.44 - 1.00 mg/dL   Calcium 8.0 (L) 8.9 - 10.3 mg/dL   GFR, Estimated 14 (L) >60 mL/min    Comment: (NOTE) Calculated using the CKD-EPI Creatinine Equation (2021)    Anion gap 16 (H) 5 - 15    Comment: Performed at [facility], [address], [place], [place] [address]  CBC with Differential     Status: Abnormal   Collection Time: 07/11/2023  1:49 PM  Result Value Ref Range   WBC 5.9 4.0 - 10.5 K/uL   RBC 2.50 (L) 3.87 - 5.11 MIL/uL   Hemoglobin 8.2 (L) 12.0 - 15.0 g/dL   HCT 95.2 (L) 84.1 - 32.4 %   MCV 96.4 80.0 - 100.0 fL   MCH 32.8 26.0 - 34.0 pg   MCHC 34.0 30.0 - 36.0 g/dL   RDW 40.1 02.7 - 25.3 %   Platelets 41 (L) 150 - 400 K/uL    Comment: SPECIMEN CHECKED FOR CLOTS Immature Platelet Fraction may be clinically indicated, consider ordering this additional test [id number] REPEATED TO VERIFY PLATELET COUNT CONFIRMED BY SMEAR    nRBC 0.0 0.0 - 0.2 %   Neutrophils Relative % 84 %   Neutro Abs 5.0 1.7 - 7.7 K/uL   Lymphocytes Relative 8 %   Lymphs Abs 0.5 (L) 0.7 - 4.0 K/uL   Monocytes Relative 6 %   Monocytes Absolute 0.3 0.1 - 1.0 K/uL   Eosinophils Relative 0 %   Eosinophils Absolute 0.0 0.0 - 0.5 K/uL   Basophils Relative 1 %   Basophils Absolute 0.0 0.0 - 0.1 K/uL   WBC Morphology MORPHOLOGY UNREMARKABLE    RBC Morphology MORPHOLOGY UNREMARKABLE    Smear Review PLATELETS APPEAR DECREASED    Immature Granulocytes 1 %   Abs Immature Granulocytes 0.07 0.00 - 0.07 K/uL    Comment: Performed at [facility], [address] [place], [place] [address]  POC occult blood, ED RN will collect     Status: Abnormal   Collection Time: 07/27/2023  4:40 PM  Result Value Ref Range   Fecal Occult Bld POSITIVE (A) NEGATIVE  POC occult blood, ED     Status: Abnormal   Collection Time: 07/17/2023  4:41 PM  Result Value Ref Range   Fecal Occult Bld POSITIVE (A) NEGATIVE   VAS [place] LOWER EXTREMITY VENOUS (DVT) (7a-7p)  Result Date:  07/14/2023  Lower Venous DVT Study Patient Name:  Erika [name]  Date of Exam:   07/13/2023 Medical Rec #: [id number]           Accession #:    [id number] Date of Birth: 03/15/1956            Patient Gender: F Patient Age:   67 years Exam Location:  [facility] Procedure:  orders placed or performed during the hospital encounter of 07/21/2023 (from the past 48 hour(s))  Basic metabolic panel     Status: Abnormal   Collection Time: 07/22/2023  1:49 PM  Result Value Ref Range   Sodium 137 135 - 145 mmol/L   Potassium 5.0 3.5 - 5.1 mmol/L   Chloride 105 98 - 111 mmol/L   CO2 16 (L) 22 - 32 mmol/L   Glucose, Bld 112 (H) 70 - 99 mg/dL    Comment: Glucose reference range applies only to samples taken after fasting for at least 8 hours.   BUN 75 (H) 8 - 23  mg/dL   Creatinine, Ser 5.78 (H) 0.44 - 1.00 mg/dL   Calcium 8.0 (L) 8.9 - 10.3 mg/dL   GFR, Estimated 14 (L) >60 mL/min    Comment: (NOTE) Calculated using the CKD-EPI Creatinine Equation (2021)    Anion gap 16 (H) 5 - 15    Comment: Performed at [facility], [address], [place], [place] [address]  CBC with Differential     Status: Abnormal   Collection Time: 07/14/2023  1:49 PM  Result Value Ref Range   WBC 5.9 4.0 - 10.5 K/uL   RBC 2.50 (L) 3.87 - 5.11 MIL/uL   Hemoglobin 8.2 (L) 12.0 - 15.0 g/dL   HCT 95.2 (L) 84.1 - 32.4 %   MCV 96.4 80.0 - 100.0 fL   MCH 32.8 26.0 - 34.0 pg   MCHC 34.0 30.0 - 36.0 g/dL   RDW 40.1 02.7 - 25.3 %   Platelets 41 (L) 150 - 400 K/uL    Comment: SPECIMEN CHECKED FOR CLOTS Immature Platelet Fraction may be clinically indicated, consider ordering this additional test [id number] REPEATED TO VERIFY PLATELET COUNT CONFIRMED BY SMEAR    nRBC 0.0 0.0 - 0.2 %   Neutrophils Relative % 84 %   Neutro Abs 5.0 1.7 - 7.7 K/uL   Lymphocytes Relative 8 %   Lymphs Abs 0.5 (L) 0.7 - 4.0 K/uL   Monocytes Relative 6 %   Monocytes Absolute 0.3 0.1 - 1.0 K/uL   Eosinophils Relative 0 %   Eosinophils Absolute 0.0 0.0 - 0.5 K/uL   Basophils Relative 1 %   Basophils Absolute 0.0 0.0 - 0.1 K/uL   WBC Morphology MORPHOLOGY UNREMARKABLE    RBC Morphology MORPHOLOGY UNREMARKABLE    Smear Review PLATELETS APPEAR DECREASED    Immature Granulocytes 1 %   Abs Immature Granulocytes 0.07 0.00 - 0.07 K/uL    Comment: Performed at [facility], [address] [place], [place] [address]  POC occult blood, ED RN will collect     Status: Abnormal   Collection Time: 07/27/2023  4:40 PM  Result Value Ref Range   Fecal Occult Bld POSITIVE (A) NEGATIVE  POC occult blood, ED     Status: Abnormal   Collection Time: 08/01/2023  4:41 PM  Result Value Ref Range   Fecal Occult Bld POSITIVE (A) NEGATIVE   VAS [place] LOWER EXTREMITY VENOUS (DVT) (7a-7p)  Result Date:  07/11/2023  Lower Venous DVT Study Patient Name:  Erika [name]  Date of Exam:   07/27/2023 Medical Rec #: [id number]           Accession #:    [id number] Date of Birth: 03/15/1956            Patient Gender: F Patient Age:   67 years Exam Location:  [facility] Procedure:  orders placed or performed during the hospital encounter of 07/09/2023 (from the past 48 hour(s))  Basic metabolic panel     Status: Abnormal   Collection Time: 07/11/2023  1:49 PM  Result Value Ref Range   Sodium 137 135 - 145 mmol/L   Potassium 5.0 3.5 - 5.1 mmol/L   Chloride 105 98 - 111 mmol/L   CO2 16 (L) 22 - 32 mmol/L   Glucose, Bld 112 (H) 70 - 99 mg/dL    Comment: Glucose reference range applies only to samples taken after fasting for at least 8 hours.   BUN 75 (H) 8 - 23  mg/dL   Creatinine, Ser 5.78 (H) 0.44 - 1.00 mg/dL   Calcium 8.0 (L) 8.9 - 10.3 mg/dL   GFR, Estimated 14 (L) >60 mL/min    Comment: (NOTE) Calculated using the CKD-EPI Creatinine Equation (2021)    Anion gap 16 (H) 5 - 15    Comment: Performed at [facility], [address], [place], [place] [address]  CBC with Differential     Status: Abnormal   Collection Time: 07/21/2023  1:49 PM  Result Value Ref Range   WBC 5.9 4.0 - 10.5 K/uL   RBC 2.50 (L) 3.87 - 5.11 MIL/uL   Hemoglobin 8.2 (L) 12.0 - 15.0 g/dL   HCT 95.2 (L) 84.1 - 32.4 %   MCV 96.4 80.0 - 100.0 fL   MCH 32.8 26.0 - 34.0 pg   MCHC 34.0 30.0 - 36.0 g/dL   RDW 40.1 02.7 - 25.3 %   Platelets 41 (L) 150 - 400 K/uL    Comment: SPECIMEN CHECKED FOR CLOTS Immature Platelet Fraction may be clinically indicated, consider ordering this additional test [id number] REPEATED TO VERIFY PLATELET COUNT CONFIRMED BY SMEAR    nRBC 0.0 0.0 - 0.2 %   Neutrophils Relative % 84 %   Neutro Abs 5.0 1.7 - 7.7 K/uL   Lymphocytes Relative 8 %   Lymphs Abs 0.5 (L) 0.7 - 4.0 K/uL   Monocytes Relative 6 %   Monocytes Absolute 0.3 0.1 - 1.0 K/uL   Eosinophils Relative 0 %   Eosinophils Absolute 0.0 0.0 - 0.5 K/uL   Basophils Relative 1 %   Basophils Absolute 0.0 0.0 - 0.1 K/uL   WBC Morphology MORPHOLOGY UNREMARKABLE    RBC Morphology MORPHOLOGY UNREMARKABLE    Smear Review PLATELETS APPEAR DECREASED    Immature Granulocytes 1 %   Abs Immature Granulocytes 0.07 0.00 - 0.07 K/uL    Comment: Performed at [facility], [address] [place], [place] [address]  POC occult blood, ED RN will collect     Status: Abnormal   Collection Time: 07/24/2023  4:40 PM  Result Value Ref Range   Fecal Occult Bld POSITIVE (A) NEGATIVE  POC occult blood, ED     Status: Abnormal   Collection Time: 07/23/2023  4:41 PM  Result Value Ref Range   Fecal Occult Bld POSITIVE (A) NEGATIVE   VAS [place] LOWER EXTREMITY VENOUS (DVT) (7a-7p)  Result Date:  07/27/2023  Lower Venous DVT Study Patient Name:  Erika [name]  Date of Exam:   07/07/2023 Medical Rec #: [id number]           Accession #:    [id number] Date of Birth: 03/15/1956            Patient Gender: F Patient Age:   67 years Exam Location:  [facility] Procedure:  orders placed or performed during the hospital encounter of 07/21/2023 (from the past 48 hour(s))  Basic metabolic panel     Status: Abnormal   Collection Time: 07/22/2023  1:49 PM  Result Value Ref Range   Sodium 137 135 - 145 mmol/L   Potassium 5.0 3.5 - 5.1 mmol/L   Chloride 105 98 - 111 mmol/L   CO2 16 (L) 22 - 32 mmol/L   Glucose, Bld 112 (H) 70 - 99 mg/dL    Comment: Glucose reference range applies only to samples taken after fasting for at least 8 hours.   BUN 75 (H) 8 - 23  mg/dL   Creatinine, Ser 5.78 (H) 0.44 - 1.00 mg/dL   Calcium 8.0 (L) 8.9 - 10.3 mg/dL   GFR, Estimated 14 (L) >60 mL/min    Comment: (NOTE) Calculated using the CKD-EPI Creatinine Equation (2021)    Anion gap 16 (H) 5 - 15    Comment: Performed at [facility], [address], [place], [place] [address]  CBC with Differential     Status: Abnormal   Collection Time: 07/14/2023  1:49 PM  Result Value Ref Range   WBC 5.9 4.0 - 10.5 K/uL   RBC 2.50 (L) 3.87 - 5.11 MIL/uL   Hemoglobin 8.2 (L) 12.0 - 15.0 g/dL   HCT 95.2 (L) 84.1 - 32.4 %   MCV 96.4 80.0 - 100.0 fL   MCH 32.8 26.0 - 34.0 pg   MCHC 34.0 30.0 - 36.0 g/dL   RDW 40.1 02.7 - 25.3 %   Platelets 41 (L) 150 - 400 K/uL    Comment: SPECIMEN CHECKED FOR CLOTS Immature Platelet Fraction may be clinically indicated, consider ordering this additional test [id number] REPEATED TO VERIFY PLATELET COUNT CONFIRMED BY SMEAR    nRBC 0.0 0.0 - 0.2 %   Neutrophils Relative % 84 %   Neutro Abs 5.0 1.7 - 7.7 K/uL   Lymphocytes Relative 8 %   Lymphs Abs 0.5 (L) 0.7 - 4.0 K/uL   Monocytes Relative 6 %   Monocytes Absolute 0.3 0.1 - 1.0 K/uL   Eosinophils Relative 0 %   Eosinophils Absolute 0.0 0.0 - 0.5 K/uL   Basophils Relative 1 %   Basophils Absolute 0.0 0.0 - 0.1 K/uL   WBC Morphology MORPHOLOGY UNREMARKABLE    RBC Morphology MORPHOLOGY UNREMARKABLE    Smear Review PLATELETS APPEAR DECREASED    Immature Granulocytes 1 %   Abs Immature Granulocytes 0.07 0.00 - 0.07 K/uL    Comment: Performed at [facility], [address] [place], [place] [address]  POC occult blood, ED RN will collect     Status: Abnormal   Collection Time: 07/27/2023  4:40 PM  Result Value Ref Range   Fecal Occult Bld POSITIVE (A) NEGATIVE  POC occult blood, ED     Status: Abnormal   Collection Time: 08/01/2023  4:41 PM  Result Value Ref Range   Fecal Occult Bld POSITIVE (A) NEGATIVE   VAS [place] LOWER EXTREMITY VENOUS (DVT) (7a-7p)  Result Date:  07/11/2023  Lower Venous DVT Study Patient Name:  Erika [name]  Date of Exam:   07/27/2023 Medical Rec #: [id number]           Accession #:    [id number] Date of Birth: 03/15/1956            Patient Gender: F Patient Age:   67 years Exam Location:  [facility] Procedure:  orders placed or performed during the hospital encounter of 07/10/2023 (from the past 48 hour(s))  Basic metabolic panel     Status: Abnormal   Collection Time: 07/15/2023  1:49 PM  Result Value Ref Range   Sodium 137 135 - 145 mmol/L   Potassium 5.0 3.5 - 5.1 mmol/L   Chloride 105 98 - 111 mmol/L   CO2 16 (L) 22 - 32 mmol/L   Glucose, Bld 112 (H) 70 - 99 mg/dL    Comment: Glucose reference range applies only to samples taken after fasting for at least 8 hours.   BUN 75 (H) 8 - 23  mg/dL   Creatinine, Ser 5.78 (H) 0.44 - 1.00 mg/dL   Calcium 8.0 (L) 8.9 - 10.3 mg/dL   GFR, Estimated 14 (L) >60 mL/min    Comment: (NOTE) Calculated using the CKD-EPI Creatinine Equation (2021)    Anion gap 16 (H) 5 - 15    Comment: Performed at [facility], [address], [place], [place] [address]  CBC with Differential     Status: Abnormal   Collection Time: 07/11/2023  1:49 PM  Result Value Ref Range   WBC 5.9 4.0 - 10.5 K/uL   RBC 2.50 (L) 3.87 - 5.11 MIL/uL   Hemoglobin 8.2 (L) 12.0 - 15.0 g/dL   HCT 95.2 (L) 84.1 - 32.4 %   MCV 96.4 80.0 - 100.0 fL   MCH 32.8 26.0 - 34.0 pg   MCHC 34.0 30.0 - 36.0 g/dL   RDW 40.1 02.7 - 25.3 %   Platelets 41 (L) 150 - 400 K/uL    Comment: SPECIMEN CHECKED FOR CLOTS Immature Platelet Fraction may be clinically indicated, consider ordering this additional test [id number] REPEATED TO VERIFY PLATELET COUNT CONFIRMED BY SMEAR    nRBC 0.0 0.0 - 0.2 %   Neutrophils Relative % 84 %   Neutro Abs 5.0 1.7 - 7.7 K/uL   Lymphocytes Relative 8 %   Lymphs Abs 0.5 (L) 0.7 - 4.0 K/uL   Monocytes Relative 6 %   Monocytes Absolute 0.3 0.1 - 1.0 K/uL   Eosinophils Relative 0 %   Eosinophils Absolute 0.0 0.0 - 0.5 K/uL   Basophils Relative 1 %   Basophils Absolute 0.0 0.0 - 0.1 K/uL   WBC Morphology MORPHOLOGY UNREMARKABLE    RBC Morphology MORPHOLOGY UNREMARKABLE    Smear Review PLATELETS APPEAR DECREASED    Immature Granulocytes 1 %   Abs Immature Granulocytes 0.07 0.00 - 0.07 K/uL    Comment: Performed at [facility], [address] [place], [place] [address]  POC occult blood, ED RN will collect     Status: Abnormal   Collection Time: 07/27/2023  4:40 PM  Result Value Ref Range   Fecal Occult Bld POSITIVE (A) NEGATIVE  POC occult blood, ED     Status: Abnormal   Collection Time: 07/17/2023  4:41 PM  Result Value Ref Range   Fecal Occult Bld POSITIVE (A) NEGATIVE   VAS [place] LOWER EXTREMITY VENOUS (DVT) (7a-7p)  Result Date:  07/14/2023  Lower Venous DVT Study Patient Name:  Erika [name]  Date of Exam:   07/13/2023 Medical Rec #: [id number]           Accession #:    [id number] Date of Birth: 03/15/1956            Patient Gender: F Patient Age:   67 years Exam Location:  [facility] Procedure:

## 2023-07-16 NOTE — ED Notes (Signed)
Patient reminded to keep arm straight to allow fluids to run in freely, patient compliant but forgets and places arm in position of comfort, which is bent for her.

## 2023-07-16 NOTE — ED Provider Notes (Signed)
Physical Exam  BP (!) 112/52   Pulse 67   Temp 97.7 F (36.5 C) (Oral)   Resp 15   Ht 5\' 1"  (1.549 m)   Wt 65.8 kg   SpO2 99%   BMI 27.40 kg/m   Physical Exam  Procedures  Procedures  ED Course / MDM   Clinical Course as of 07/12/2023 1745  Sat Jul 16, 2023  1456 Hx EtOH, quit a few weeks ago. Mechanical fall 1 week ago. Left hip pain, bruising, limited mobility. - AKI - F/u hip CT - Admit [KH]    Clinical Course User Index [KH] Claretha Cooper, DO   Medical Decision Making Amount and/or Complexity of Data Reviewed Labs: ordered. Radiology: ordered.  Risk OTC drugs. Prescription drug management. Decision regarding hospitalization.   At the time my assumption of care, patient is afebrile, mildly hypotensive with MAP of 63, in no acute distress.  She is receiving IV fluid bolus and on reassessment following this blood pressure is improved.  Results reviewed.  CBC is without leukocytosis but does demonstrate hemoglobin down trended to 8.2 from prior 10.71-week ago.  Additional AKI present with creatinine 3.36 from baseline less than 1.  Anion gap of 16, likely related to uremia associated with patient's AKI.  Imaging reviewed and hip CT demonstrates hematoma without noncontrasted evidence of active extravasation.  Contrasted CT was deferred given her AKI.  Otherwise no fracture or other acute injury noted.  Given hematoma which limits patient's mobility as well as AKI likely related to decreased p.o. intake and downtrending hemoglobin, I do feel the patient benefit from admission for further management.  Discussed with hospitalist team.  Patient admitted and transferred without further acute event while under my care in the emergency department.       Claretha Cooper, DO 07/04/2023 2317    Cathren Laine, MD 07/17/23 [phone]

## 2023-07-16 NOTE — Assessment & Plan Note (Signed)
-   most likely multifactorial secondary to combination of  dehydration secondary to decreased by mouth intake,  polypharmacy   - Will rehydrate    - Hold contributing medications   - if no improvement may need further imaging to evaluate for CNS pathology pathology such as MRI of the brain   - neurological exam appears to be nonfocal but patient unable to cooperate fully   - VBG ordered   -  ammonia ordered Asterixis noted

## 2023-07-17 ENCOUNTER — Inpatient Hospital Stay (HOSPITAL_COMMUNITY): Payer: 59

## 2023-07-17 DIAGNOSIS — K921 Melena: Secondary | ICD-10-CM | POA: Diagnosis not present

## 2023-07-17 DIAGNOSIS — I517 Cardiomegaly: Secondary | ICD-10-CM

## 2023-07-17 DIAGNOSIS — K7031 Alcoholic cirrhosis of liver with ascites: Secondary | ICD-10-CM | POA: Diagnosis not present

## 2023-07-17 DIAGNOSIS — I8510 Secondary esophageal varices without bleeding: Secondary | ICD-10-CM | POA: Diagnosis not present

## 2023-07-17 DIAGNOSIS — D500 Iron deficiency anemia secondary to blood loss (chronic): Secondary | ICD-10-CM | POA: Diagnosis not present

## 2023-07-17 LAB — CBC
HCT: 24.3 % — ABNORMAL LOW (ref 36.0–46.0)
HCT: 24.9 % — ABNORMAL LOW (ref 36.0–46.0)
HCT: 27.1 % — ABNORMAL LOW (ref 36.0–46.0)
Hemoglobin: 8 g/dL — ABNORMAL LOW (ref 12.0–15.0)
Hemoglobin: 8.3 g/dL — ABNORMAL LOW (ref 12.0–15.0)
Hemoglobin: 9.1 g/dL — ABNORMAL LOW (ref 12.0–15.0)
MCH: 30.3 pg (ref 26.0–34.0)
MCH: 30.4 pg (ref 26.0–34.0)
MCH: 31 pg (ref 26.0–34.0)
MCHC: 32.9 g/dL (ref 30.0–36.0)
MCHC: 33.3 g/dL (ref 30.0–36.0)
MCHC: 33.6 g/dL (ref 30.0–36.0)
MCV: 92 fL (ref 80.0–100.0)
MCV: 91.2 fL (ref 80.0–100.0)
MCV: 92.2 fL (ref 80.0–100.0)
Platelets: 42 10*3/uL — ABNORMAL LOW (ref 150–400)
Platelets: 42 10*3/uL — ABNORMAL LOW (ref 150–400)
Platelets: 36 10*3/uL — ABNORMAL LOW (ref 150–400)
RBC: 2.64 MIL/uL — ABNORMAL LOW (ref 3.87–5.11)
RBC: 2.73 MIL/uL — ABNORMAL LOW (ref 3.87–5.11)
RBC: 2.94 MIL/uL — ABNORMAL LOW (ref 3.87–5.11)
RDW: 15.2 % (ref 11.5–15.5)
RDW: 15.6 % — ABNORMAL HIGH (ref 11.5–15.5)
RDW: 15.9 % — ABNORMAL HIGH (ref 11.5–15.5)
WBC: 7.9 10*3/uL (ref 4.0–10.5)
WBC: 8.8 10*3/uL (ref 4.0–10.5)
WBC: 7.3 10*3/uL (ref 4.0–10.5)
nRBC: 0 % (ref 0.0–0.2)
nRBC: 0 % (ref 0.0–0.2)
nRBC: 0 % (ref 0.0–0.2)

## 2023-07-17 LAB — COMPREHENSIVE METABOLIC PANEL
ALT: 13 U/L (ref 0–44)
AST: 23 U/L (ref 15–41)
Albumin: 1.7 g/dL — ABNORMAL LOW (ref 3.5–5.0)
Alkaline Phosphatase: 88 U/L (ref 38–126)
Anion gap: 12 (ref 5–15)
BUN: 78 mg/dL — ABNORMAL HIGH (ref 8–23)
CO2: 16 mmol/L — ABNORMAL LOW (ref 22–32)
Calcium: 7.5 mg/dL — ABNORMAL LOW (ref 8.9–10.3)
Chloride: 109 mmol/L (ref 98–111)
Creatinine, Ser: 3.24 mg/dL — ABNORMAL HIGH (ref 0.44–1.00)
GFR, Estimated: 15 mL/min — ABNORMAL LOW (ref >60)
Glucose, Bld: 68 mg/dL — ABNORMAL LOW (ref 70–99)
Potassium: 4.3 mmol/L (ref 3.5–5.1)
Sodium: 137 mmol/L (ref 135–145)
Total Bilirubin: 4.9 mg/dL — ABNORMAL HIGH (ref 0.3–1.2)
Total Protein: 5.5 g/dL — ABNORMAL LOW (ref 6.5–8.1)

## 2023-07-17 LAB — IRON AND TIBC
Iron: 21 ug/dL — ABNORMAL LOW (ref 28–170)
Saturation Ratios: 17 % (ref 10.4–31.8)
TIBC: 123 ug/dL — ABNORMAL LOW (ref 250–450)
UIBC: 102 ug/dL

## 2023-07-17 LAB — CK: Total CK: 24 U/L — ABNORMAL LOW (ref 38–234)

## 2023-07-17 LAB — BLOOD GAS, VENOUS
Acid-base deficit: 9.3 mmol/L — ABNORMAL HIGH (ref 0.0–2.0)
Bicarbonate: 16.3 mmol/L — ABNORMAL LOW (ref 20.0–28.0)
Drawn by: 7341
O2 Saturation: 89.3 %
Patient temperature: 36.7
pCO2, Ven: 34 mmHg — ABNORMAL LOW (ref 44–60)
pH, Ven: 7.29 (ref 7.25–7.43)
pO2, Ven: 57 mmHg — ABNORMAL HIGH (ref 32–45)

## 2023-07-17 LAB — ECHOCARDIOGRAM COMPLETE
AR max vel: 1.97 cm2
AV Area VTI: 1.93 cm2
AV Area mean vel: 1.95 cm2
AV Mean grad: 9 mmHg
AV Peak grad: 16.3 mmHg
Ao pk vel: 2.02 m/s
Area-P 1/2: 4.86 cm2
Height: 61 in
S' Lateral: 2.9 cm
Weight: 2320 [oz_av]

## 2023-07-17 LAB — HEPATITIS PANEL, ACUTE
HCV Ab: NONREACTIVE
Hep A IgM: NONREACTIVE
Hep B C IgM: REACTIVE — AB
Hepatitis B Surface Ag: NONREACTIVE

## 2023-07-17 LAB — HIV ANTIBODY (ROUTINE TESTING W REFLEX): HIV Screen 4th Generation wRfx: NONREACTIVE

## 2023-07-17 LAB — HEPATIC FUNCTION PANEL
ALT: 14 U/L (ref 0–44)
AST: 24 U/L (ref 15–41)
Albumin: 1.5 g/dL — ABNORMAL LOW (ref 3.5–5.0)
Alkaline Phosphatase: 104 U/L (ref 38–126)
Bilirubin, Direct: 2.6 mg/dL — ABNORMAL HIGH (ref 0.0–0.2)
Indirect Bilirubin: 1.9 mg/dL — ABNORMAL HIGH (ref 0.3–0.9)
Total Bilirubin: 4.5 mg/dL — ABNORMAL HIGH (ref 0.3–1.2)
Total Protein: 5.7 g/dL — ABNORMAL LOW (ref 6.5–8.1)

## 2023-07-17 LAB — AMMONIA: Ammonia: 112 umol/L — ABNORMAL HIGH (ref 9–35)

## 2023-07-17 LAB — PROTIME-INR
INR: 1.6 — ABNORMAL HIGH (ref 0.8–1.2)
Prothrombin Time: 19.3 s — ABNORMAL HIGH (ref 11.4–15.2)

## 2023-07-17 LAB — MAGNESIUM
Magnesium: 2.4 mg/dL (ref 1.7–2.4)
Magnesium: 2.2 mg/dL (ref 1.7–2.4)

## 2023-07-17 LAB — HEPATITIS B SURFACE ANTIBODY,QUALITATIVE: Hep B S Ab: NONREACTIVE

## 2023-07-17 LAB — HEPATITIS A ANTIBODY, TOTAL: hep A Total Ab: NONREACTIVE

## 2023-07-17 LAB — OSMOLALITY: Osmolality: 313 mosm/kg — ABNORMAL HIGH (ref 275–295)

## 2023-07-17 LAB — PROCALCITONIN: Procalcitonin: 1.86 ng/mL

## 2023-07-17 LAB — VITAMIN B12: Vitamin B-12: 2821 pg/mL — ABNORMAL HIGH (ref 180–914)

## 2023-07-17 LAB — TSH: TSH: 3.869 u[IU]/mL (ref 0.350–4.500)

## 2023-07-17 LAB — FOLATE: Folate: 5.3 ng/mL — ABNORMAL LOW (ref >5.9)

## 2023-07-17 LAB — PHOSPHORUS
Phosphorus: 6.7 mg/dL — ABNORMAL HIGH (ref 2.5–4.6)
Phosphorus: 7.6 mg/dL — ABNORMAL HIGH (ref 2.5–4.6)

## 2023-07-17 LAB — PREPARE RBC (CROSSMATCH)

## 2023-07-17 LAB — FERRITIN: Ferritin: 219 ng/mL (ref 11–307)

## 2023-07-17 LAB — ETHANOL: Alcohol, Ethyl (B): <10 mg/dL (ref <10)

## 2023-07-17 MED ORDER — STERILE WATER FOR INJECTION IV SOLN
INTRAVENOUS | Status: DC
  Filled 2023-07-17 (×4): qty 1000

## 2023-07-17 MED ORDER — SODIUM CHLORIDE 0.9% IV SOLUTION: Freq: Once | INTRAVENOUS | Status: AC

## 2023-07-17 MED ORDER — SODIUM CHLORIDE 0.9 % IV BOLUS: 1000.0000 mL | Freq: Once | INTRAVENOUS | Status: DC

## 2023-07-17 MED ORDER — FENTANYL CITRATE PF 50 MCG/ML IJ SOSY: 12.5000 ug | PREFILLED_SYRINGE | INTRAMUSCULAR | Status: DC | PRN

## 2023-07-17 MED ORDER — ALBUMIN HUMAN 5 % IV SOLN
12.5000 g | Freq: Once | INTRAVENOUS | Status: AC
  Administered 2023-07-17: 12.5 g via INTRAVENOUS
  Filled 2023-07-17: qty 250

## 2023-07-17 MED ORDER — PREGABALIN 25 MG PO CAPS
25.0000 mg | ORAL_CAPSULE | Freq: Two times a day (BID) | ORAL | Status: DC
  Administered 2023-07-17 – 2023-07-21 (×9): 25 mg via ORAL
  Filled 2023-07-17 (×9): qty 1

## 2023-07-17 MED ORDER — RIFAXIMIN 550 MG PO TABS
550.0000 mg | ORAL_TABLET | Freq: Two times a day (BID) | ORAL | Status: DC
  Administered 2023-07-17 – 2023-07-18 (×3): 550 mg via ORAL
  Filled 2023-07-17 (×3): qty 1

## 2023-07-17 MED ORDER — SODIUM CHLORIDE 0.9 % IV SOLN
50.0000 ug/h | INTRAVENOUS | Status: DC
  Administered 2023-07-17 – 2023-07-19 (×5): 50 ug/h via INTRAVENOUS
  Filled 2023-07-17 (×6): qty 1

## 2023-07-17 MED ORDER — SODIUM CHLORIDE 0.9 % IV SOLN
2.0000 g | Freq: Every day | INTRAVENOUS | Status: DC
  Administered 2023-07-17 – 2023-07-18 (×2): 2 g via INTRAVENOUS
  Filled 2023-07-17 (×2): qty 20

## 2023-07-17 MED ORDER — OCTREOTIDE LOAD VIA INFUSION
50.0000 ug | Freq: Once | INTRAVENOUS | Status: AC
  Administered 2023-07-17: 50 ug via INTRAVENOUS
  Filled 2023-07-17 (×2): qty 25

## 2023-07-17 MED ORDER — PANTOPRAZOLE 80MG IVPB - SIMPLE MED
80.0000 mg | Freq: Once | INTRAVENOUS | Status: AC
  Administered 2023-07-17: 80 mg via INTRAVENOUS
  Filled 2023-07-17: qty 100

## 2023-07-17 MED ORDER — PANTOPRAZOLE SODIUM 40 MG IV SOLR: 40.0000 mg | Freq: Two times a day (BID) | INTRAVENOUS | Status: DC

## 2023-07-17 MED ORDER — VITAMIN K1 10 MG/ML IJ SOLN
5.0000 mg | Freq: Once | INTRAVENOUS | Status: AC
  Administered 2023-07-17: 5 mg via INTRAVENOUS
  Filled 2023-07-17: qty 0.5

## 2023-07-17 MED ORDER — FLUOXETINE HCL 10 MG PO CAPS
10.0000 mg | ORAL_CAPSULE | Freq: Every day | ORAL | Status: DC
  Administered 2023-07-17 – 2023-07-21 (×5): 10 mg via ORAL
  Filled 2023-07-17 (×9): qty 1

## 2023-07-17 MED ORDER — SODIUM CHLORIDE 0.9 % IV SOLN: INTRAVENOUS | Status: DC

## 2023-07-17 MED ORDER — PERFLUTREN LIPID MICROSPHERE
1.0000 mL | INTRAVENOUS | Status: AC | PRN
  Administered 2023-07-17: 2 mL via INTRAVENOUS

## 2023-07-17 MED ORDER — PANTOPRAZOLE INFUSION (NEW) - SIMPLE MED
8.0000 mg/h | INTRAVENOUS | Status: DC
  Administered 2023-07-17 – 2023-07-18 (×2): 8 mg/h via INTRAVENOUS
  Filled 2023-07-17 (×4): qty 100

## 2023-07-17 NOTE — Evaluation (Signed)
Occupational Therapy Evaluation Patient Details Name: Erika [name] MRN: [id number] DOB: [birth year] Today's Date: 07/17/2023   History of Present Illness 67 y.o. female presenting 08/03/2023 after ground level fall with head strike. PMH significant for rheumatoid arthritis, cirrhosis secondary to alcohol abuse, bipolar disorder, history of portal hypertension, history of from a cytopenia, GERD, hypertension.   Clinical Impression   PTA, pt lived with her son who provided her transportation. Upon eval, pt with pain in LLE and R elbow. Pt with generalized weakness and decr activity tolerance. Pt needing +2 mod A for SPT transfers and bed mobility this session. Needing up to max A for LB ADL. Patient will benefit from continued inpatient follow up therapy, <3 hours/day       If plan is discharge home, recommend the following: Two people to help with walking and/or transfers;A lot of help with bathing/dressing/bathroom;Two people to help with bathing/dressing/bathroom;Assistance with cooking/housework;Help with stairs or ramp for entrance;Assist for transportation    Functional Status Assessment  Patient has had a recent decline in their functional status and demonstrates the ability to make significant improvements in function in a reasonable and predictable amount of time.  Equipment Recommendations  Other (comment) (defer)    Recommendations for Other Services       Precautions / Restrictions Precautions Precautions: Fall Restrictions Weight Bearing Restrictions: No      Mobility Bed Mobility Overal bed mobility: Needs Assistance Bed Mobility: Rolling, Sidelying to Sit Rolling: Mod assist Sidelying to sit: +2 for physical assistance, Mod assist       General bed mobility comments: Painful with moving to get up; 2 person pshycial assist to elevate trunk to sit    Transfers Overall transfer level: Needs assistance Equipment used: 2 person hand held assist Transfers: Sit  to/from Stand, Bed to chair/wheelchair/BSC Sit to Stand: Mod assist, +2 physical assistance     Step pivot transfers: Mod assist, +2 physical assistance, +2 safety/equipment     General transfer comment: 2 person assist to give bilateral support with power up to stand; pivotal steps bed to recliner with bil support at shoulder girdles and gait belt      Balance Overall balance assessment: Needs assistance   Sitting balance-Leahy Scale: Poor (approaching Fair) Sitting balance - Comments: Initially needing up to mod assist sitting EOB; used bed pads to assist in scootin gto EOB and allow for bil feet to the floor; with incr time, able to sit EOB with contact cuard assist     Standing balance-Leahy Scale: Poor Standing balance comment: Reliant on Handheld assist for balance                           ADL either performed or assessed with clinical judgement   ADL Overall ADL's : Needs assistance/impaired Eating/Feeding: Set up;Sitting   Grooming: Set up;Sitting   Upper Body Bathing: Minimal assistance;Sitting   Lower Body Bathing: Maximal assistance;Total assistance;+2 for safety/equipment   Upper Body Dressing : Minimal assistance;Sitting   Lower Body Dressing: Maximal assistance;Total assistance;+2 for safety/equipment;Sit to/from stand   Toilet Transfer: Moderate assistance;+2 for physical assistance;+2 for safety/equipment;Stand-pivot           Functional mobility during ADLs: Moderate assistance;+2 for physical assistance;+2 for safety/equipment       Vision Ability to See in Adequate Light: 0 Adequate Patient Visual Report: No change from baseline Vision Assessment?: No apparent visual deficits     Perception  Praxis         Pertinent Vitals/Pain Pain Assessment Pain Assessment: Faces Faces Pain Scale: Hurts whole lot Pain Location: R elbow with movement; trunk with  grimace coming up to sitting EOB; LLE with weight bearing, standing,  stepping Pain Descriptors / Indicators: Grimacing, Guarding Pain Intervention(s): Limited activity within patient's tolerance, Monitored during session     Extremity/Trunk Assessment Upper Extremity Assessment Upper Extremity Assessment: RUE deficits/detail;Right hand dominant RUE Deficits / Details: notable edema. imaging was negative. 90 degrees elbow flexion tolerated and lacking ~40 degrees extension; suprinating to ~65 degrees weakness with grip strength but could be due to pain.   Lower Extremity Assessment Lower Extremity Assessment: Generalized weakness;LLE deficits/detail LLE Deficits / Details: Notable large hematoma LLE proximolateral aspect of L hip and thigh; painful wit standing and stepping   Cervical / Trunk Assessment Cervical / Trunk Assessment: Other exceptions Cervical / Trunk Exceptions: Painful with movement post fall   Communication Communication Communication: No apparent difficulties   Cognition Arousal: Alert Behavior During Therapy: WFL for tasks assessed/performed Overall Cognitive Status: Impaired/Different from baseline Area of Impairment: Orientation, Attention, Memory, Problem solving                 Orientation Level: Time, Place, Situation Current Attention Level: Sustained Memory: Decreased short-term memory       Problem Solving: Decreased initiation, Difficulty sequencing, Requires verbal cues, Requires tactile cues       General Comments  See doc flowsheets for orthostatic BPs    Exercises     Shoulder Instructions      Home Living Family/patient expects to be discharged to:: Private residence Living Arrangements: Children (adult son, works days) Available Help at Discharge: Family Type of Home: House Home Access: Level entry     Home Layout: One level     Bathroom Shower/Tub: Chief Strategy Officer: Standard     Home Equipment: Medical laboratory scientific officer - single point   Additional Comments: Questional[employer] historian       Prior Functioning/Environment Prior Level of Function : Needs assist;Independent/Modified Independent             Mobility Comments: Uses her cane intermittently ADLs Comments: reports independence; son drives        OT Problem List: Decreased strength;Decreased activity tolerance;Impaired balance (sitting and/or standing);Decreased cognition;Decreased safety awareness;Decreased range of motion;Decreased knowledge of use of DME or AE      OT Treatment/Interventions: Self-care/ADL training;Therapeutic exercise;DME and/or AE instruction;Balance training;Patient/family education;Therapeutic activities;Cognitive remediation/compensation    OT Goals(Current goals can be found in the care plan section) Acute Rehab OT Goals Patient Stated Goal: go home OT Goal Formulation: With patient Time For Goal Achievement: 07/31/23 Potential to Achieve Goals: Good  OT Frequency: Min 1X/week    Co-evaluation PT/OT/SLP Co-Evaluation/Treatment: Yes Reason for Co-Treatment: For patient/therapist safety PT goals addressed during session: Mobility/safety with mobility OT goals addressed during session: ADL's and self-care      AM-PAC OT "6 Clicks" Daily Activity     Outcome Measure Help from another person eating meals?: A Little Help from another person taking care of personal grooming?: A Little Help from another person toileting, which includes using toliet, bedpan, or urinal?: A Lot Help from another person bathing (including washing, rinsing, drying)?: A Lot Help from another person to put on and taking off regular upper body clothing?: A Little Help from another person to put on and taking off regular lower body clothing?: Total 6 Click Score: 14   End of Session  Equipment Utilized During Treatment: Stage manager Communication: Mobility status  Activity Tolerance: Patient tolerated treatment well Patient left: with call bell/phone within reach;in chair;with chair alarm set  OT Visit  Diagnosis: Unsteadiness on feet (R26.81);Muscle weakness (generalized) (M62.81);History of falling (Z91.81);Other symptoms and signs involving cognitive function                Time: 5284-1324 OT Time Calculation (min): 24 min Charges:  OT General Charges $OT Visit: 1 Visit OT Evaluation $OT Eval Low Complexity: 1 Low  Tyler Deis, OTR/L [facility] Acute Rehabilitation Office: [phone]   Myrla Halsted 07/17/2023, 2:39 PM

## 2023-07-17 NOTE — Evaluation (Signed)
Physical Therapy Evaluation Patient Details Name: Erika [name] MRN: [id number] DOB: 30-Aug-1956 Today's Date: 07/17/2023  History of Present Illness  67 y.o. female presenting 07/27/2023 after ground level fall with head strike. PMH significant for rheumatoid arthritis, cirrhosis secondary to alcohol abuse, bipolar disorder, history of portal hypertension, history of from a cytopenia, GERD, hypertension.  Clinical Impression   Pt admitted with above diagnosis. Lives at home with adult son (who works day), in a single-level home with a level entry? Possibly a few steps to enter; Prior to admission, pt was able to overall manage independently, had made the decision to stop drinking alcohol approx 2 weeks prior to admission; Presents to PT with pain in LLE, trunk, and R elbow limiting functional mobility and activity tolerance, generalized weakness, and functional dependencies; Unable to get a proper walkign O2 sat reading today, but session conducted on room at and O2 sats stayed consistently at or above 97%; Pt needed 2 person mod assist to sit up to EOB, power up to standing, and for balance as she made pivot steps from bed to recliner on her R; Patient will benefit from continued inpatient follow up therapy, <3 hours/day;  Pt currently with functional limitations due to the deficits listed below (see PT Problem List). Pt will benefit from skilled PT to increase their independence and safety with mobility to allow discharge to the venue listed below.           If plan is discharge home, recommend the following: A lot of help with walking and/or transfers;A lot of help with bathing/dressing/bathroom;Assistance with cooking/housework;Help with stairs or ramp for entrance   Can travel by private vehicle   No    Equipment Recommendations Rolling walker (2 wheels);BSC/3in1  Recommendations for Other Services       Functional Status Assessment Patient has had a recent decline in their functional  status and demonstrates the ability to make significant improvements in function in a reasonable and predictable amount of time.     Precautions / Restrictions Precautions Precautions: Fall Restrictions Weight Bearing Restrictions: No      Mobility  Bed Mobility Overal bed mobility: Needs Assistance Bed Mobility: Rolling, Sidelying to Sit Rolling: Mod assist Sidelying to sit: +2 for physical assistance, Mod assist       General bed mobility comments: Painful with moving to get up; 2 person pshycial assist to elevate trunk to sit    Transfers Overall transfer level: Needs assistance Equipment used: 2 person hand held assist Transfers: Sit to/from Stand, Bed to chair/wheelchair/BSC Sit to Stand: Mod assist, +2 physical assistance   Step pivot transfers: Mod assist, +2 physical assistance, +2 safety/equipment       General transfer comment: 2 person assist to give bilateral support with power up to stand; pivotal steps bed to recliner with bil support at shoulder girdles and gait belt    Ambulation/Gait               General Gait Details: LLE too painful to initialte gait today  Stairs            Wheelchair Mobility     Tilt Bed    Modified Rankin (Stroke Patients Only)       Balance Overall balance assessment: Needs assistance   Sitting balance-Leahy Scale: Poor (approaching Fair) Sitting balance - Comments: Initially needing up to mod assist sitting EOB; used bed pads to assist in scootin gto EOB and allow for bil feet to the floor; with incr time,  able to sit EOB with contact cuard assist     Standing balance-Leahy Scale: Poor Standing balance comment: Reliant on Handheld assist for balance                             Pertinent Vitals/Pain Pain Assessment Pain Assessment: Faces Faces Pain Scale: Hurts whole lot Pain Location: R elbow with movement; trunk with  grimace coming up to sitting EOB; LLE with weight bearing, standing,  stepping Pain Descriptors / Indicators: Grimacing, Guarding Pain Intervention(s): Monitored during session, Limited activity within patient's tolerance, Repositioned, Other (comment) (Notified MD re: elbow pain)    Home Living Family/patient expects to be discharged to:: Private residence Living Arrangements: Children (adult son, works days) Available Help at Discharge: Family Type of Home: House Home Access: Level entry       Home Layout: One level Home Equipment: Medical laboratory scientific officer - single point Additional Comments: Questional[employer] historian    Prior Function Prior Level of Function : Needs assist;Independent/Modified Independent             Mobility Comments: Uses her cane intermittently ADLs Comments: reports independence     Extremity/Trunk Assessment   Upper Extremity Assessment Upper Extremity Assessment: Defer to OT evaluation    Lower Extremity Assessment Lower Extremity Assessment: Generalized weakness;LLE deficits/detail LLE Deficits / Details: Notable large hematoma LLE proximolateral aspect of L hip and thigh; painful wit standing and stepping    Cervical / Trunk Assessment Cervical / Trunk Assessment: Other exceptions Cervical / Trunk Exceptions: Painful with movement post fall  Communication   Communication Communication: No apparent difficulties  Cognition Arousal: Alert Behavior During Therapy: WFL for tasks assessed/performed Overall Cognitive Status: Impaired/Different from baseline Area of Impairment: Orientation, Attention, Memory, Problem solving                 Orientation Level: Time, Place, Situation   Memory: Decreased short-term memory       Problem Solving: Decreased initiation, Difficulty sequencing, Requires verbal cues, Requires tactile cues          General Comments General comments (skin integrity, edema, etc.): See doc flowsheets for orthostatic BPs    Exercises     Assessment/Plan    PT Assessment Patient needs continued  PT services  PT Problem List Decreased strength;Decreased activity tolerance;Decreased range of motion;Decreased balance;Decreased mobility;Decreased coordination;Decreased cognition;Decreased knowledge of use of DME;Decreased safety awareness;Decreased knowledge of precautions;Pain       PT Treatment Interventions DME instruction;Gait training;Stair training;Functional mobility training;Therapeutic activities;Therapeutic exercise;Balance training;Neuromuscular re-education;Cognitive remediation;Patient/family education    PT Goals (Current goals can be found in the Care Plan section)  Acute Rehab PT Goals Patient Stated Goal: Less pain with getting up and moving PT Goal Formulation: With patient Time For Goal Achievement: 07/31/23 Potential to Achieve Goals: Good    Frequency Min 1X/week     Co-evaluation PT/OT/SLP Co-Evaluation/Treatment: Yes Reason for Co-Treatment: For patient/therapist safety PT goals addressed during session: Mobility/safety with mobility         AM-PAC PT "6 Clicks" Mobility  Outcome Measure Help needed turning from your back to your side while in a flat bed without using bedrails?: A Little Help needed moving from lying on your back to sitting on the side of a flat bed without using bedrails?: A Lot Help needed moving to and from a bed to a chair (including a wheelchair)?: A Lot Help needed standing up from a chair using your arms (e.g., wheelchair or bedside  chair)?: A Lot Help needed to walk in hospital room?: A Lot Help needed climbing 3-5 steps with a railing? : Total 6 Click Score: 12    End of Session Equipment Utilized During Treatment: Gait belt Activity Tolerance: Patient limited by pain Patient left: in chair;with call bell/phone within reach;with chair alarm set Nurse Communication: Mobility status PT Visit Diagnosis: Unsteadiness on feet (R26.81);Other abnormalities of gait and mobility (R26.89);History of falling (Z91.81);Pain Pain -  part of body: Leg;Arm (Left hip/thigh, and R elbow)    Time: 4098-1191 PT Time Calculation (min) (ACUTE ONLY): 40 min   Charges:   PT Evaluation $PT Eval Moderate Complexity: 1 Mod PT Treatments $Therapeutic Activity: 8-22 mins PT General Charges $$ ACUTE PT VISIT: 1 Visit         Van Clines, PT  Acute Rehabilitation Services Office [phone] Secure Chat welcomed   Levi Aland 07/17/2023, 11:53 AM

## 2023-07-17 NOTE — Progress Notes (Signed)
Patient ID: Erika [name], female   DOB: December 29, 1955, 67 y.o.   MRN: [id number] Pt presented to [place] dept today for paracentesis; on limited US abd in all four quadrants there is  only a trace amount of ascites present, not enough to safely tap/aspirate at this time. Procedure cancelled. Pt notified.

## 2023-07-17 NOTE — H&P (View-Only) (Signed)
Consultation  Referring Provider:   [facility] Primary Care Physician:  Claiborne Rigg, NP Primary Gastroenterologist:  Gentry Fitz       Reason for Consultation:   Anemia , melena DOA: 07/31/2023         Hospital Day: 2         HPI:   Erika [name] is a 67 y.o. 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   Presents to the ER with status post fall 1 week prior with left hip swelling.  During evaluation patient found to have Hgb 8.2, FOBT positive, melena, GI consulted.  Work up notable for  Hgb 8.2, MCV 96.4 Reticulocytes fraction 9.1 retake count 38.6 very low Folate low 5.2 pending B12 Albumin 1.7, AST 23, ALT 13, alk phos 88, Tbili 4.9 Platelets 41 INR 1.7 not on anticoagulation Iron 21, saturation 17, ferritin 219, TIBC 123 Negative alcohol, normal thyroid, CPK low 24 BUN 75, creatinine 3.36 Pending ammonia, HIV, acute hepatitis panel  CT head unremarkable, CT cervical spine OA, CT hip 34 cm subcutaneous hematoma no fracture  No family was present at the time of my evaluation. Patient has OV in our office in Nov with PA Shanda Bumps to establish as a new patient.  Patient is lying in the chair by the window. [place], has been taking Aleve 3 times daily for the last week, and started seeing melena during that week.  Denies any melena prior to that, denies iron or Pepto.[name]  Has had some loose stools since being here, denies any hematochezia. Patient does have reflux and intermittent dysphagia to solid foods, denies nausea or vomiting.   Has had abdominal swelling in the past month, has had leg swelling since her fall a week ago.  Denies ever having jaundice, pruritus,.  She has had some dark-colored urine in the past 1  to 2 weeks. She does complain of some confusion recently. She denies any chest pain or shortness of breath Patient's had some weight gain over the last month but has had 5 pound weight loss that she stopped drinking 2 to 3 weeks ago.  Patient quit drinking 2 to 3 weeks ago when she was told she had cirrhosis.  Prior to that she was drinking nightly states at least 2-3 beers for over 10 years.  She does smoke cigarettes to try to cut back denies any drug use. Denies family history of liver issues, denies family history of GI malignancy.   05/25/2023 MR abdomen with without contrast  showed cirrhosis, no liver masses, portal hypertension with moderate splenomegaly, small to moderate ascites and large gastroesophageal varices, cholelithiasis diffuse gallbladder wall thickening nonspecific and edema, trace bilateral pleural effusions left greater than right  Abnormal ED labs: Abnormal Labs Reviewed  BASIC METABOLIC PANEL - Abnormal; Notable for the following components:      Result Value   CO2 16 (*)    Glucose, Bld 112 (*)    BUN 75 (*)    Creatinine, Ser 3.36 (*)    Calcium 8.0 (*)    GFR, Estimated 14 (*)    Anion gap 16 (*)    All other components within normal limits  CBC WITH DIFFERENTIAL/PLATELET - Abnormal; Notable for the following components:   RBC 2.50 (*)  Hemoglobin 8.2 (*)    HCT 24.1 (*)    Platelets 41 (*)    Lymphs Abs 0.5 (*)    All other components within normal limits  PROTIME-INR - Abnormal; Notable for the following components:   Prothrombin Time 20.0 (*)    INR 1.7 (*)    All other components within normal limits  HEPATIC FUNCTION PANEL - Abnormal; Notable for the following components:   Total Protein 5.7 (*)    Albumin 1.5 (*)    Total Bilirubin 4.5 (*)    Bilirubin, Direct 2.6 (*)    Indirect Bilirubin 1.9 (*)    All other components within normal limits  FOLATE - Abnormal; Notable for the following components:   Folate 5.3 (*)    All other components  within normal limits  IRON AND TIBC - Abnormal; Notable for the following components:   Iron 21 (*)    TIBC 123 (*)    All other components within normal limits  RETICULOCYTES - Abnormal; Notable for the following components:   RBC. 2.41 (*)    All other components within normal limits  AMMONIA - Abnormal; Notable for the following components:   Ammonia 109 (*)    All other components within normal limits  CK - Abnormal; Notable for the following components:   Total CK 24 (*)    All other components within normal limits  PHOSPHORUS - Abnormal; Notable for the following components:   Phosphorus 6.7 (*)    All other components within normal limits  OSMOLALITY - Abnormal; Notable for the following components:   Osmolality 313 (*)    All other components within normal limits  CBC - Abnormal; Notable for the following components:   RBC 2.46 (*)    Hemoglobin 7.5 (*)    HCT 23.0 (*)    Platelets 41 (*)    All other components within normal limits  BLOOD GAS, VENOUS - Abnormal; Notable for the following components:   pCO2, Ven 34 (*)    pO2, Ven 57 (*)    Bicarbonate 16.3 (*)    Acid-base deficit 9.3 (*)    All other components within normal limits  PROTIME-INR - Abnormal; Notable for the following components:   Prothrombin Time 19.3 (*)    INR 1.6 (*)    All other components within normal limits  CBC - Abnormal; Notable for the following components:   RBC 2.64 (*)    Hemoglobin 8.0 (*)    HCT 24.3 (*)    Platelets 42 (*)    All other components within normal limits  COMPREHENSIVE METABOLIC PANEL - Abnormal; Notable for the following components:   CO2 16 (*)    Glucose, Bld 68 (*)    BUN 78 (*)    Creatinine, Ser 3.24 (*)    Calcium 7.5 (*)    Total Protein 5.5 (*)    Albumin 1.7 (*)    Total Bilirubin 4.9 (*)    GFR, Estimated 15 (*)    All other components within normal limits  PHOSPHORUS - Abnormal; Notable for the following components:   Phosphorus 7.6 (*)    All  other components within normal limits  POC OCCULT BLOOD, ED - Abnormal; Notable for the following components:   Fecal Occult Bld POSITIVE (*)    All other components within normal limits  POC OCCULT BLOOD, ED - Abnormal; Notable for the following components:   Fecal Occult Bld POSITIVE (*)    All other components within normal  limits    Past Medical History:  Diagnosis Date   Arthritis    Cirrhosis (HCC)    Depression    GERD (gastroesophageal reflux disease)    History of shingles 10/18/2018   Hypertension    Rheumatoid arthritis(714.0)     Surgical History:  She  has a past surgical history that includes Breast reduction surgery; Reduction mammaplasty (Bilateral); Amputation toe (Right, 04/24/2021); Amputation toe (Left, 07/11/2021); and Amputation toe (Right, 04/21/2022). Family History:  Her family history includes Psoriasis in her sister. Social History:   reports that she has been smoking cigarettes. She uses smokeless tobacco. She reports that she does not currently use alcohol after a past usage of about 5.0 standard drinks of alcohol per week. She reports current drug use. Drug: Marijuana.  Prior to Admission medications   Medication Sig Start Date End Date Taking? Authorizing Provider  FLUoxetine (PROZAC) 20 MG capsule Take 1 capsule (20 mg total) by mouth daily. 07/08/23  Yes Claiborne Rigg, NP  lamoTRIgine (LAMICTAL) 100 MG tablet Take 1 tablet (100 mg total) by mouth 2 (two) times daily. 07/08/23  Yes Claiborne Rigg, NP  pregabalin (LYRICA) 100 MG capsule Take 1 capsule (100 mg total) by mouth 2 (two) times daily. 07/08/23  Yes Claiborne Rigg, NP  traZODone (DESYREL) 100 MG tablet Take 1 tablet (100 mg total) by mouth at bedtime. 07/08/23  Yes Claiborne Rigg, NP  venlafaxine XR (EFFEXOR-XR) 150 MG 24 hr capsule Take 1 capsule (150 mg total) by mouth daily with breakfast. 07/08/23  Yes Claiborne Rigg, NP  ARIPiprazole (ABILIFY) 5 MG tablet Take 5 mg by mouth daily. 07/11/23    [provider]  thiamine (VITAMIN B1) 100 MG tablet Take 1 tablet (100 mg total) by mouth daily. Patient not taking: Reported on 07/28/2023 07/08/23   Claiborne Rigg, NP    Current Facility-Administered Medications  Medication Dose Route Frequency Provider Last Rate Last Admin   fentaNYL (SUBLIMAZE) injection 12.5-50 mcg  12.5-50 mcg Intravenous Q2H PRN Doutova, Jonny Ruiz, MD       nicotine (NICODERM CQ - dosed in mg/24 hr) patch 7 mg  7 mg Transdermal Daily Doutova, Jonny Ruiz, MD   7 mg at 07/17/23 0851   pantoprazole (PROTONIX) injection 40 mg  40 mg Intravenous Q12H Therisa Doyne, MD   40 mg at 07/17/23 0851   phytonadione (VITAMIN K) 5 mg in dextrose 5 % 50 mL IVPB  5 mg Intravenous Once Rhetta Mura, MD       sodium bicarbonate 150 mEq in sterile water 1,150 mL infusion   Intravenous Continuous Samtani, Jai-Gurmukh, MD       sodium chloride 0.9 % bolus 1,000 mL  1,000 mL Intravenous Once Rhetta Mura, MD       thiamine (VITAMIN B1) injection 100 mg  100 mg Intravenous Q24H Therisa Doyne, MD   100 mg at 07/17/23 0022    Allergies as of 07/26/2023   (No Known Allergies)    Review of Systems:    Constitutional: No weight loss, fever, chills, weakness or fatigue HEENT: Eyes: No change in vision               Ears, Nose, Throat:  No change in hearing or congestion Skin: No rash or itching Cardiovascular: No chest pain, chest pressure or palpitations   Respiratory: No SOB or cough Gastrointestinal: See HPI and otherwise negative Genitourinary: No dysuria or change in urinary frequency Neurological: No headache, dizziness or syncope Musculoskeletal:  No new muscle or joint pain Hematologic: No bleeding or bruising Psychiatric: No history of depression or anxiety     Physical Exam:  Vital signs in last 24 hours: Temp:  [97.5 F (36.4 C)-98.7 F (37.1 C)] 97.9 F (36.6 C) (09/15 0842) Pulse Rate:  [65-76] 76 (09/15 0842) Resp:  [13-20] 16  (09/15 0842) BP: (81-123)/(38-78) 102/45 (09/15 0842) SpO2:  [98 %-100 %] 99 % (09/15 0842) Weight:  [65.8 kg] 65.8 kg (09/14 1328) Last BM Date : 07/17/23 Last BM recorded by nurses in past 5 days No data recorded  General : Obese, jaundiced female in no acute distress Head: Left eye with ecchymosis, poor dentition Eyes :  scleral icterus,conjunctive pale  Heart:  regular rate and rhythm, no murmurs or gallops Pulm:  Clear anteriorly; no wheezing Abdomen:   Soft, Obese AB, skin exam, Normal bowel sounds. mild tenderness in the upper abdomen. Without guarding and Without rebound, without hepatomegaly. no  fluid wave,  with  shifting dullness.  Extremities:   Bilateral hands with swelling at MCP, DIP, some ulcerations.  Left hip from mid hip down to mid calf on the lateral left side with large indurated warm hematoma.  2-3+ swelling left leg, 1+ swelling on right leg. Msk:  Symmetrical without gross deformities. Peripheral pulses intact.  Neurologic: Alert and  oriented x4;  grossly normal neurologically. with asterixis and clonus.  Skin:   with jaundice. no palmar erythema or spider angioma.   Psychiatric:  Demonstrates good judgement and reason without abnormal affect or behaviors.  LAB RESULTS: Recent Labs    07/05/2023 1349 07/15/2023 2324 07/17/23 0646  WBC 5.9 5.9 7.9  HGB 8.2* 7.5* 8.0*  HCT 24.1* 23.0* 24.3*  PLT 41* 41* 42*   BMET Recent Labs    07/13/2023 1349 07/17/23 0646  NA 137 137  K 5.0 4.3  CL 105 109  CO2 16* 16*  GLUCOSE 112* 68*  BUN 75* 78*  CREATININE 3.36* 3.24*  CALCIUM 8.0* 7.5*   LFT Recent Labs    07/12/2023 2324 07/17/23 0646  PROT 5.7* 5.5*  ALBUMIN 1.5* 1.7*  AST 24 23  ALT 14 13  ALKPHOS 104 88  BILITOT 4.5* 4.9*  BILIDIR 2.6*  --   IBILI 1.9*  --    PT/INR Recent Labs    07/19/2023 2324 07/17/23 0646  LABPROT 20.0* 19.3*  INR 1.7* 1.6*    STUDIES: US RENAL  Result Date: 08/01/2023 CLINICAL DATA:  AKI. EXAM: RENAL / URINARY  TRACT ULTRASOUND COMPLETE COMPARISON:  07/11/2021. FINDINGS: Right Kidney: Renal measurements: 9.9 x 4.4 x 4.0 cm = volume: 89.6 mL. Increased parenchymal echogenicity. No mass or hydronephrosis visualized. Left Kidney: Renal measurements: 10.4 x 4.4 x 4.4 cm = volume: 105.8 mL. Echogenicity within normal limits. No mass or hydronephrosis visualized. Bladder: Appears normal for degree of bladder distention. A left ureteral jet is seen. Other: Mild ascites is noted in the right upper quadrants and bilateral lower quadrants. IMPRESSION: 1. Increased renal parenchymal echogenicity on the right, which may be associated with medical renal disease. 2. Normal examination of the left kidney and urinary bladder. 3. Mild ascites. Electronically Signed   By: Thornell Sartorius M.D.   On: 07/28/2023 20:14   VAS [place] LOWER EXTREMITY VENOUS (DVT) (7a-7p)  Result Date: 07/30/2023  Lower Venous DVT Study Patient Name:  REJOICE COMISKEY Thal  Date of Exam:   07/13/2023 Medical Rec #: [id number]           Accession #:  [id number] Date of Birth: 05-20-1956            Patient Gender: F Patient Age:   47 years Exam Location:  [facility] Procedure:      VAS [place] LOWER EXTREMITY VENOUS (DVT) Referring Phys: Greta Doom TRAN --------------------------------------------------------------------------------  Indications: Pain, Swelling, and Bruising status post fall onto curb on left hip.  Limitations: Significant edema, especially throughout thigh, clothing, and pain with compression. Comparison Study: No prior study on file Performing Technologist: Sherren Kerns RVS  Examination Guidelines: A complete evaluation includes B-mode imaging, spectral Doppler, color Doppler, and power Doppler as needed of all accessible portions of each vessel. Bilateral testing is considered an integral part of a complete examination. Limited examinations for reoccurring indications may be performed as noted. The reflux portion of the exam is performed with the  patient in reverse Trendelenburg.  +-----+---------------+---------+-----------+----------+--------------+ RIGHTCompressibilityPhasicitySpontaneityPropertiesThrombus Aging +-----+---------------+---------+-----------+----------+--------------+ CFV  Full           Yes      Yes                                 +-----+---------------+---------+-----------+----------+--------------+   +---------+---------------+---------+-----------+----------+-------------------+ LEFT     CompressibilityPhasicitySpontaneityPropertiesThrombus Aging      +---------+---------------+---------+-----------+----------+-------------------+ CFV      Full           Yes      Yes                                      +---------+---------------+---------+-----------+----------+-------------------+ SFJ      Full                                                             +---------+---------------+---------+-----------+----------+-------------------+ FV Prox                 Yes      Yes                  patent by color and                                                       Doppler             +---------+---------------+---------+-----------+----------+-------------------+ FV Mid                  Yes      Yes                  patent by color and                                                       Doppler             +---------+---------------+---------+-----------+----------+-------------------+ FV Distal               Yes  Yes                  patent by color and                                                       Doppler             +---------+---------------+---------+-----------+----------+-------------------+ PFV                     Yes      Yes                  patent by color and                                                       Doppler             +---------+---------------+---------+-----------+----------+-------------------+ POP                      Yes      Yes                  patent by color and                                                       Doppler             +---------+---------------+---------+-----------+----------+-------------------+ PTV      Full                                                             +---------+---------------+---------+-----------+----------+-------------------+ PERO     Full                                                             +---------+---------------+---------+-----------+----------+-------------------+     Summary: RIGHT: - No evidence of common femoral vein obstruction.   LEFT: - There is no evidence of deep vein thrombosis in the lower extremity.  - No cystic structure found in the popliteal fossa.  *See table(s) above for measurements and observations. Electronically signed by Sherald Hess MD on 08/07/23 at 5:54:44 PM.    Final    CT HIP LEFT WO CONTRAST  Result Date: 2023-08-07 CLINICAL DATA:  Hip trauma, left hip pain, fall 8 days ago. EXAM: CT OF THE LEFT HIP WITHOUT CONTRAST TECHNIQUE: Multidetector CT imaging of the left hip was performed according to the standard protocol. Multiplanar CT image reconstructions were also generated. RADIATION DOSE REDUCTION: This exam was performed according to the departmental dose-optimization program which includes automated exposure control, adjustment of the mA and/or  kV according to patient size and/or use of iterative reconstruction technique. COMPARISON:  Radiographs 07-31-2023 FINDINGS: Bones/Joint/Cartilage No fracture or acute bony findings identified. No hip joint effusion. Ligaments Suboptimally assessed by CT. Muscles and Tendons Unremarkable Soft tissues 7.9 by 1.8 by 4.5 cm (volume = 34 cm^3) subcutaneous hematoma lateral to the left proximal femur. This does not immediately abut the superficial fascia margin and accordingly is probably not a Morel-Lavallee lesion. Adjacent subcutaneous and cutaneous  edema/bruising noted. Pelvic ascites noted. This has been shown on prior exams such as the MRI abdomen from 05/25/2023 and is likely ascribe a bowl to the patient's cirrhosis. Aortoiliac atherosclerotic vascular calcifications noted. Upper normal sized left external iliac lymph node at 0.9 cm. IMPRESSION: 1. 34 cubic cm subcutaneous hematoma lateral to the left proximal femur. 2. No fracture or acute bony findings. 3. Pelvic ascites, likely ascribable to the patient's known hepatic cirrhosis. 4. Aortoiliac atherosclerotic vascular calcifications. Aortic Atherosclerosis (ICD10-I70.0). Electronically Signed   By: Gaylyn Rong M.D.   On: 07-31-2023 16:10   DG CHEST PORT 1 VIEW  Result Date: 07-31-23 CLINICAL DATA:  Fall EXAM: PORTABLE CHEST 1 VIEW COMPARISON:  04/23/2021 FINDINGS: Cardiomegaly. Both lungs are clear. The visualized skeletal structures are unremarkable. IMPRESSION: Cardiomegaly. No acute abnormality of the lungs in portable projection. Electronically Signed   By: Jearld Lesch M.D.   On: July 31, 2023 15:23   CT Head Wo Contrast  Result Date: 07/31/23 CLINICAL DATA:  Larey Seat yesterday. Patient hit her head. Left-sided black eye. Loss of consciousness at time of fall. EXAM: CT HEAD WITHOUT CONTRAST CT MAXILLOFACIAL WITHOUT CONTRAST CT CERVICAL SPINE WITHOUT CONTRAST TECHNIQUE: Multidetector CT imaging of the head, cervical spine, and maxillofacial structures were performed using the standard protocol without intravenous contrast. Multiplanar CT image reconstructions of the cervical spine and maxillofacial structures were also generated. RADIATION DOSE REDUCTION: This exam was performed according to the departmental dose-optimization program which includes automated exposure control, adjustment of the mA and/or kV according to patient size and/or use of iterative reconstruction technique. COMPARISON:  None Available. FINDINGS: CT HEAD FINDINGS Brain: No evidence of acute infarction, hemorrhage,  hydrocephalus, extra-axial collection or mass lesion/mass effect. Vascular: No hyperdense vessel or unexpected calcification. Skull: Normal. Negative for fracture or focal lesion. Other: None. CT MAXILLOFACIAL FINDINGS Osseous: No fracture or mandibular dislocation. No destructive process. Orbits: Negative. No traumatic or inflammatory finding. Sinuses: Clear. Soft tissues: Small focus of soft tissue contusion lateral to the left globe and orbit. CT CERVICAL SPINE FINDINGS Alignment: Slight anterolisthesis of C4 on C5, degenerative. Straightened cervical lordosis. Skull base and vertebrae: No acute fracture. No primary bone lesion or focal pathologic process. Soft tissues and spinal canal: No prevertebral fluid or swelling. No visible canal hematoma. Disc levels: Well maintained disc spaces. No significant disc bulging. No evidence of a disc herniation. Bilateral facet degenerative changes. Upper chest: No acute findings. Chronic interstitial thickening noted at the lung apices. Other: None. IMPRESSION: HEAD CT 1. No acute intracranial abnormalities. MAXILLOFACIAL CT 1. No fracture or acute skeletal abnormality. 2. Small area of soft tissue contusion lateral to the left orbit. CERVICAL CT 1. No fracture or acute finding. Electronically Signed   By: Amie Portland M.D.   On: 2023-07-31 15:08   CT Cervical Spine Wo Contrast  Result Date: Jul 31, 2023 CLINICAL DATA:  Larey Seat yesterday. Patient hit her head. Left-sided black eye. Loss of consciousness at time of fall. EXAM: CT HEAD WITHOUT CONTRAST CT MAXILLOFACIAL WITHOUT CONTRAST CT CERVICAL SPINE WITHOUT CONTRAST TECHNIQUE:  Multidetector CT imaging of the head, cervical spine, and maxillofacial structures were performed using the standard protocol without intravenous contrast. Multiplanar CT image reconstructions of the cervical spine and maxillofacial structures were also generated. RADIATION DOSE REDUCTION: This exam was performed according to the departmental  dose-optimization program which includes automated exposure control, adjustment of the mA and/or kV according to patient size and/or use of iterative reconstruction technique. COMPARISON:  None Available. FINDINGS: CT HEAD FINDINGS Brain: No evidence of acute infarction, hemorrhage, hydrocephalus, extra-axial collection or mass lesion/mass effect. Vascular: No hyperdense vessel or unexpected calcification. Skull: Normal. Negative for fracture or focal lesion. Other: None. CT MAXILLOFACIAL FINDINGS Osseous: No fracture or mandibular dislocation. No destructive process. Orbits: Negative. No traumatic or inflammatory finding. Sinuses: Clear. Soft tissues: Small focus of soft tissue contusion lateral to the left globe and orbit. CT CERVICAL SPINE FINDINGS Alignment: Slight anterolisthesis of C4 on C5, degenerative. Straightened cervical lordosis. Skull base and vertebrae: No acute fracture. No primary bone lesion or focal pathologic process. Soft tissues and spinal canal: No prevertebral fluid or swelling. No visible canal hematoma. Disc levels: Well maintained disc spaces. No significant disc bulging. No evidence of a disc herniation. Bilateral facet degenerative changes. Upper chest: No acute findings. Chronic interstitial thickening noted at the lung apices. Other: None. IMPRESSION: HEAD CT 1. No acute intracranial abnormalities. MAXILLOFACIAL CT 1. No fracture or acute skeletal abnormality. 2. Small area of soft tissue contusion lateral to the left orbit. CERVICAL CT 1. No fracture or acute finding. Electronically Signed   By: Amie Portland M.D.   On: 08/01/2023 15:08   CT Maxillofacial Wo Contrast  Result Date: 07/07/2023 CLINICAL DATA:  Larey Seat yesterday. Patient hit her head. Left-sided black eye. Loss of consciousness at time of fall. EXAM: CT HEAD WITHOUT CONTRAST CT MAXILLOFACIAL WITHOUT CONTRAST CT CERVICAL SPINE WITHOUT CONTRAST TECHNIQUE: Multidetector CT imaging of the head, cervical spine, and  maxillofacial structures were performed using the standard protocol without intravenous contrast. Multiplanar CT image reconstructions of the cervical spine and maxillofacial structures were also generated. RADIATION DOSE REDUCTION: This exam was performed according to the departmental dose-optimization program which includes automated exposure control, adjustment of the mA and/or kV according to patient size and/or use of iterative reconstruction technique. COMPARISON:  None Available. FINDINGS: CT HEAD FINDINGS Brain: No evidence of acute infarction, hemorrhage, hydrocephalus, extra-axial collection or mass lesion/mass effect. Vascular: No hyperdense vessel or unexpected calcification. Skull: Normal. Negative for fracture or focal lesion. Other: None. CT MAXILLOFACIAL FINDINGS Osseous: No fracture or mandibular dislocation. No destructive process. Orbits: Negative. No traumatic or inflammatory finding. Sinuses: Clear. Soft tissues: Small focus of soft tissue contusion lateral to the left globe and orbit. CT CERVICAL SPINE FINDINGS Alignment: Slight anterolisthesis of C4 on C5, degenerative. Straightened cervical lordosis. Skull base and vertebrae: No acute fracture. No primary bone lesion or focal pathologic process. Soft tissues and spinal canal: No prevertebral fluid or swelling. No visible canal hematoma. Disc levels: Well maintained disc spaces. No significant disc bulging. No evidence of a disc herniation. Bilateral facet degenerative changes. Upper chest: No acute findings. Chronic interstitial thickening noted at the lung apices. Other: None. IMPRESSION: HEAD CT 1. No acute intracranial abnormalities. MAXILLOFACIAL CT 1. No fracture or acute skeletal abnormality. 2. Small area of soft tissue contusion lateral to the left orbit. CERVICAL CT 1. No fracture or acute finding. Electronically Signed   By: Amie Portland M.D.   On: 07/25/2023 15:08   DG Knee Complete 4 Views Left  Result Date:  07/15/2023 CLINICAL DATA:  Fall 8 days ago.  Left knee pain. EXAM: LEFT KNEE - COMPLETE 4+ VIEW COMPARISON:  None Available. FINDINGS: No fracture or acute finding.  No bone lesion. Joint space narrowing, most evident of the medial compartment, with tricompartmental marginal osteophytes consistent with osteoarthritis. No convincing joint effusion. Soft tissues are unremarkable. IMPRESSION: No fracture or dislocation Electronically Signed   By: Amie Portland M.D.   On: 07/25/2023 14:51   DG Hip Unilat W or Wo Pelvis 2-3 Views Left  Result Date: 07/15/2023 CLINICAL DATA:  Fall 8 days ago.  Left hip pain. EXAM: DG HIP (WITH OR WITHOUT PELVIS) 2-3V LEFT COMPARISON:  None Available. FINDINGS: No fracture or bone lesion. Hip joints, SI joints and pubic symphysis normally spaced and aligned. Soft tissue edema is noted lateral to the proximal left femur/hip joint. IMPRESSION: No fracture or dislocation. Electronically Signed   By: Amie Portland M.D.   On: 07/20/2023 14:50   DG Hand Complete Left  Result Date: 07/24/2023 CLINICAL DATA:  Fall 8 days ago.  Hand pain. EXAM: LEFT HAND - COMPLETE 3+ VIEW COMPARISON:  None Available. FINDINGS: No convincing acute fracture. Old healed fractures of distal radius and likely of the index finger proximal phalanx. Joints are normally aligned. Skeletal structures are diffusely demineralized. Soft tissues are unremarkable. IMPRESSION: 1. No acute fracture or dislocation. Electronically Signed   By: Amie Portland M.D.   On: 07/17/2023 14:49   DG Elbow Complete Right  Result Date: 07/24/2023 CLINICAL DATA:  Fall 8 days ago.  Right elbow pain. EXAM: RIGHT ELBOW - COMPLETE 3+ VIEW COMPARISON:  None Available. FINDINGS: No convincing acute fracture. Deformity of the radial head suggests an old, healed fracture. Elbow joint is normally aligned. No convincing joint effusion. Posterior soft tissue swelling. IMPRESSION: 1. No convincing acute fracture.  No dislocation. 2. Posterior  soft tissue swelling. Electronically Signed   By: Amie Portland M.D.   On: 07/24/2023 14:48      Impression/Plan:   Acute on chronic anemia in setting of fall with large hematoma, FOBT+ stool, melena 07/17/2023  HGB 8.0 down 2 HGB from 8 days prior  07/23/2023 Iron 21 Ferritin 219  09/15, s/p 1 PRBC and 1 platelets  Patient with coagulopathy has received vitamin K -While some of the new anemia may be from large hematoma, patient is also been taking Aleve 3 times a day for the past week with melena, some concern for gastritis, CT with large variceals, do not believe this is a variceal bleed but will start patient on proton pump infusion and octreotide to prevent worsening bleeding as well as potentially help kidney function. --Continue to monitor H&H with transfusion as needed to maintain hemoglobin greater than 7. -EGD to evaluate portal HTN/varices/gastritis, will need colonoscopy at some point to evaluate IDA- timing per Dr. Reginia Forts for endoscopic procedures, we have not time today unless patient has unstable bleeding I thoroughly discussed the procedure to include nature, alternatives, benefits, and risks including but not limited to bleeding, perforation, infection, anesthesia/cardiac and pulmonary complications. Patient provides understanding and gave verbal consent to proceed.  Decompensated cirrhosis likely secondary to ETOH/fall AST 23 ALT 13 Alkphos 88 TBili 4.9 INR 07/17/2023 1.6  MELD 3.0: 31 at 07/17/2023  6:46 AM MELD-Na: 29 at 07/17/2023  6:46 AM -Most likely secondary to alcohol -Pending acute hepatitis panel, will check for hepatitis immunity, Will get AMA, ASMA - Serial INR, CBC, CMET daily. - Daily MELD  Acute  renal failure possibly due to hepatorenal, more likely secondary to blood loss/azotemia BUN 78 Cr 3.24  GFR 15  Potassium 4.3  Magnesium 2.2  -Avoid large volume paracentesis -Diuretics on hold, DC antihypertensives -Avoid nephrotoxic drugs -1 g/kg per day of  Albumin (max 100 g) for 2 days.  -Will start patient on octreotide for melena in setting of cirrhosis with large varices seen on CT however octreotide may also help kidney perfusion.  Ascites Ascites on exam.  -Will schedule for diagnostic and therapeutic paracentesis. -Diuretics currently on hold due to renal function, continue to monitor renal function.  -With severe CKD history, will do max of 1-2 L -Please send for cell count with differential, albumin concentration, total protein concentration. - Will empirically treat with ceftriaxone in setting of potential GI bleed.  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  -IV Protonix Bolus & Drip  -IV Ceftriaxone 2g x1 now after paracentesis (if varices noted then will continue treatment for at least 5 days) -Will plan for tentative EGD, time pending - trend CBC -Continue volume resuscitation with Hgb goal >7 (higher Hgb rates have been associated with worsened mortality in the setting of portal hypertensive variceal bleeds); unless active bleeding is occuring -INR Goal <2.0 (may proceed with FFP if necessary and give IV Vitamin K 10 to optimize prior to procedure) -Monitor I/Os closely  Alcohol abuse -Empiric treatment with folic acid, multivitamin and thiamine.  -Alcohol Abstinence counseling discussed with patient -Monitor for withdrawal symptoms while inpatient   Hepatic Encephalopathy:  07/08/2023 Ammonia 109 Possible trigger: GI Bleed/fall/ETOH - Lactulose 30ml BID or TID titrate to 3bms per day - Rifaximin 550mg  bid - minimize/remove all benzos and narcotics -Patient advised to not drive due to impaired reflexes - add on zinc  Thrombocytopenia secondary to above and likely ETOH  use/nutrition Platelets 42 S/p 1 platelets  with Coagulopathy possible nutritional component 07/17/2023 INR 1.6 Vitamin K 5 mg once 09/15 Continue to monitor INR daily, vitamin K as needed  Folate def Replace with folic acid   Principal Problem:   Blood loss anemia Active Problems:   Rheumatoid arthritis (HCC)   Alcohol use disorder   Thrombocytopenia (HCC)   GERD (gastroesophageal reflux disease)   Tobacco abuse   Bipolar 2 disorder (HCC)   Prolonged QT interval   Cardiomegaly   Occult blood positive stool   Acute encephalopathy    LOS: 1 day   Thank you for your kind consultation, we will continue to follow. Further recommendations per Dr. Nelly Laurence  07/17/2023, 9:23 AM   Attending physician's note  I have taken a history, reviewed the chart and examined the patient. I performed a substantive portion of this encounter, including complete performance of at least one of the key components, in conjunction with the APP. I agree with the APP's note, impression and recommendations.   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  PPI gtt  Patient is currently hemodynamically stable  Plan for EGD tomorrow to evaluate and intervene endoscopically if needed. Given risk for possible variceal hemorrhage, will hold off colonoscopy /bowel prep at this point, can be done later if needed, if patient has no obvious source of GI bleed on EGD  AKI: IV albumin and octreotide Consider nephrology consult, cannot exclude hepatorenal syndrome  Ascites: No safe pocket for diagnostic tap IV ceftriaxone for SBP prophylaxis  Hepatic  encephalopathy: Continue Xifaxan and lactulose    The patient was provided an opportunity to ask questions and all were answered. The patient agreed with the plan and demonstrated an understanding of the instructions.  Iona Beard , MD [phone]

## 2023-07-17 NOTE — Consult Note (Addendum)
Consultation  Referring Provider:   [facility] Primary Care Physician:  Claiborne Rigg, NP Primary Gastroenterologist:  Gentry Fitz       Reason for Consultation:   Anemia , melena DOA: 07/31/2023         Hospital Day: 2         HPI:   Erika [name] is a 67 y.o. 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   Presents to the ER with status post fall 1 week prior with left hip swelling.  During evaluation patient found to have Hgb 8.2, FOBT positive, melena, GI consulted.  Work up notable for  Hgb 8.2, MCV 96.4 Reticulocytes fraction 9.1 retake count 38.6 very low Folate low 5.2 pending B12 Albumin 1.7, AST 23, ALT 13, alk phos 88, Tbili 4.9 Platelets 41 INR 1.7 not on anticoagulation Iron 21, saturation 17, ferritin 219, TIBC 123 Negative alcohol, normal thyroid, CPK low 24 BUN 75, creatinine 3.36 Pending ammonia, HIV, acute hepatitis panel  CT head unremarkable, CT cervical spine OA, CT hip 34 cm subcutaneous hematoma no fracture  No family was present at the time of my evaluation. Patient has OV in our office in Nov with PA Shanda Bumps to establish as a new patient.  Patient is lying in the chair by the window. [place], has been taking Aleve 3 times daily for the last week, and started seeing melena during that week.  Denies any melena prior to that, denies iron or Pepto.Erika [name]  Has had some loose stools since being here, denies any hematochezia. Patient does have reflux and intermittent dysphagia to solid foods, denies nausea or vomiting.   Has had abdominal swelling in the past month, has had leg swelling since her fall a week ago.  Denies ever having jaundice, pruritus,.  She has had some dark-colored urine in the past 1  to 2 weeks. She does complain of some confusion recently. She denies any chest pain or shortness of breath Patient's had some weight gain over the last month but has had 5 pound weight loss that she stopped drinking 2 to 3 weeks ago.  Patient quit drinking 2 to 3 weeks ago when she was told she had cirrhosis.  Prior to that she was drinking nightly states at least 2-3 beers for over 10 years.  She does smoke cigarettes to try to cut back denies any drug use. Denies family history of liver issues, denies family history of GI malignancy.   05/25/2023 MR abdomen with without contrast  showed cirrhosis, no liver masses, portal hypertension with moderate splenomegaly, small to moderate ascites and large gastroesophageal varices, cholelithiasis diffuse gallbladder wall thickening nonspecific and edema, trace bilateral pleural effusions left greater than right  Abnormal ED labs: Abnormal Labs Reviewed  BASIC METABOLIC PANEL - Abnormal; Notable for the following components:      Result Value   CO2 16 (*)    Glucose, Bld 112 (*)    BUN 75 (*)    Creatinine, Ser 3.36 (*)    Calcium 8.0 (*)    GFR, Estimated 14 (*)    Anion gap 16 (*)    All other components within normal limits  CBC WITH DIFFERENTIAL/PLATELET - Abnormal; Notable for the following components:   RBC 2.50 (*)  Hemoglobin 8.2 (*)    HCT 24.1 (*)    Platelets 41 (*)    Lymphs Abs 0.5 (*)    All other components within normal limits  PROTIME-INR - Abnormal; Notable for the following components:   Prothrombin Time 20.0 (*)    INR 1.7 (*)    All other components within normal limits  HEPATIC FUNCTION PANEL - Abnormal; Notable for the following components:   Total Protein 5.7 (*)    Albumin 1.5 (*)    Total Bilirubin 4.5 (*)    Bilirubin, Direct 2.6 (*)    Indirect Bilirubin 1.9 (*)    All other components within normal limits  FOLATE - Abnormal; Notable for the following components:   Folate 5.3 (*)    All other components  within normal limits  IRON AND TIBC - Abnormal; Notable for the following components:   Iron 21 (*)    TIBC 123 (*)    All other components within normal limits  RETICULOCYTES - Abnormal; Notable for the following components:   RBC. 2.41 (*)    All other components within normal limits  AMMONIA - Abnormal; Notable for the following components:   Ammonia 109 (*)    All other components within normal limits  CK - Abnormal; Notable for the following components:   Total CK 24 (*)    All other components within normal limits  PHOSPHORUS - Abnormal; Notable for the following components:   Phosphorus 6.7 (*)    All other components within normal limits  OSMOLALITY - Abnormal; Notable for the following components:   Osmolality 313 (*)    All other components within normal limits  CBC - Abnormal; Notable for the following components:   RBC 2.46 (*)    Hemoglobin 7.5 (*)    HCT 23.0 (*)    Platelets 41 (*)    All other components within normal limits  BLOOD GAS, VENOUS - Abnormal; Notable for the following components:   pCO2, Ven 34 (*)    pO2, Ven 57 (*)    Bicarbonate 16.3 (*)    Acid-base deficit 9.3 (*)    All other components within normal limits  PROTIME-INR - Abnormal; Notable for the following components:   Prothrombin Time 19.3 (*)    INR 1.6 (*)    All other components within normal limits  CBC - Abnormal; Notable for the following components:   RBC 2.64 (*)    Hemoglobin 8.0 (*)    HCT 24.3 (*)    Platelets 42 (*)    All other components within normal limits  COMPREHENSIVE METABOLIC PANEL - Abnormal; Notable for the following components:   CO2 16 (*)    Glucose, Bld 68 (*)    BUN 78 (*)    Creatinine, Ser 3.24 (*)    Calcium 7.5 (*)    Total Protein 5.5 (*)    Albumin 1.7 (*)    Total Bilirubin 4.9 (*)    GFR, Estimated 15 (*)    All other components within normal limits  PHOSPHORUS - Abnormal; Notable for the following components:   Phosphorus 7.6 (*)    All  other components within normal limits  POC OCCULT BLOOD, ED - Abnormal; Notable for the following components:   Fecal Occult Bld POSITIVE (*)    All other components within normal limits  POC OCCULT BLOOD, ED - Abnormal; Notable for the following components:   Fecal Occult Bld POSITIVE (*)    All other components within normal  limits    Past Medical History:  Diagnosis Date   Arthritis    Cirrhosis (HCC)    Depression    GERD (gastroesophageal reflux disease)    History of shingles 10/18/2018   Hypertension    Rheumatoid arthritis(714.0)     Surgical History:  She  has a past surgical history that includes Breast reduction surgery; Reduction mammaplasty (Bilateral); Amputation toe (Right, 04/24/2021); Amputation toe (Left, 07/11/2021); and Amputation toe (Right, 04/21/2022). Family History:  Her family history includes Psoriasis in her sister. Social History:   reports that she has been smoking cigarettes. She uses smokeless tobacco. She reports that she does not currently use alcohol after a past usage of about 5.0 standard drinks of alcohol per week. She reports current drug use. Drug: Marijuana.  Prior to Admission medications   Medication Sig Start Date End Date Taking? Authorizing Provider  FLUoxetine (PROZAC) 20 MG capsule Take 1 capsule (20 mg total) by mouth daily. 07/08/23  Yes Claiborne Rigg, NP  lamoTRIgine (LAMICTAL) 100 MG tablet Take 1 tablet (100 mg total) by mouth 2 (two) times daily. 07/08/23  Yes Claiborne Rigg, NP  pregabalin (LYRICA) 100 MG capsule Take 1 capsule (100 mg total) by mouth 2 (two) times daily. 07/08/23  Yes Claiborne Rigg, NP  traZODone (DESYREL) 100 MG tablet Take 1 tablet (100 mg total) by mouth at bedtime. 07/08/23  Yes Claiborne Rigg, NP  venlafaxine XR (EFFEXOR-XR) 150 MG 24 hr capsule Take 1 capsule (150 mg total) by mouth daily with breakfast. 07/08/23  Yes Claiborne Rigg, NP  ARIPiprazole (ABILIFY) 5 MG tablet Take 5 mg by mouth daily. 07/11/23    [provider]  thiamine (VITAMIN B1) 100 MG tablet Take 1 tablet (100 mg total) by mouth daily. Patient not taking: Reported on 07/28/2023 07/08/23   Claiborne Rigg, NP    Current Facility-Administered Medications  Medication Dose Route Frequency Provider Last Rate Last Admin   fentaNYL (SUBLIMAZE) injection 12.5-50 mcg  12.5-50 mcg Intravenous Q2H PRN Doutova, Jonny Ruiz, MD       nicotine (NICODERM CQ - dosed in mg/24 hr) patch 7 mg  7 mg Transdermal Daily Doutova, Jonny Ruiz, MD   7 mg at 07/17/23 0851   pantoprazole (PROTONIX) injection 40 mg  40 mg Intravenous Q12H Therisa Doyne, MD   40 mg at 07/17/23 0851   phytonadione (VITAMIN K) 5 mg in dextrose 5 % 50 mL IVPB  5 mg Intravenous Once Rhetta Mura, MD       sodium bicarbonate 150 mEq in sterile water 1,150 mL infusion   Intravenous Continuous Samtani, Jai-Gurmukh, MD       sodium chloride 0.9 % bolus 1,000 mL  1,000 mL Intravenous Once Rhetta Mura, MD       thiamine (VITAMIN B1) injection 100 mg  100 mg Intravenous Q24H Therisa Doyne, MD   100 mg at 07/17/23 0022    Allergies as of 07/26/2023   (No Known Allergies)    Review of Systems:    Constitutional: No weight loss, fever, chills, weakness or fatigue HEENT: Eyes: No change in vision               Ears, Nose, Throat:  No change in hearing or congestion Skin: No rash or itching Cardiovascular: No chest pain, chest pressure or palpitations   Respiratory: No SOB or cough Gastrointestinal: See HPI and otherwise negative Genitourinary: No dysuria or change in urinary frequency Neurological: No headache, dizziness or syncope Musculoskeletal:  No new muscle or joint pain Hematologic: No bleeding or bruising Psychiatric: No history of depression or anxiety     Physical Exam:  Vital signs in last 24 hours: Temp:  [97.5 F (36.4 C)-98.7 F (37.1 C)] 97.9 F (36.6 C) (09/15 0842) Pulse Rate:  [65-76] 76 (09/15 0842) Resp:  [13-20] 16  (09/15 0842) BP: (81-123)/(38-78) 102/45 (09/15 0842) SpO2:  [98 %-100 %] 99 % (09/15 0842) Weight:  [65.8 kg] 65.8 kg (09/14 1328) Last BM Date : 07/17/23 Last BM recorded by nurses in past 5 days No data recorded  General : Obese, jaundiced female in no acute distress Head: Left eye with ecchymosis, poor dentition Eyes :  scleral icterus,conjunctive pale  Heart:  regular rate and rhythm, no murmurs or gallops Pulm:  Clear anteriorly; no wheezing Abdomen:   Soft, Obese AB, skin exam, Normal bowel sounds. mild tenderness in the upper abdomen. Without guarding and Without rebound, without hepatomegaly. no  fluid wave,  with  shifting dullness.  Extremities:   Bilateral hands with swelling at MCP, DIP, some ulcerations.  Left hip from mid hip down to mid calf on the lateral left side with large indurated warm hematoma.  2-3+ swelling left leg, 1+ swelling on right leg. Msk:  Symmetrical without gross deformities. Peripheral pulses intact.  Neurologic: Alert and  oriented x4;  grossly normal neurologically. with asterixis and clonus.  Skin:   with jaundice. no palmar erythema or spider angioma.   Psychiatric:  Demonstrates good judgement and reason without abnormal affect or behaviors.  LAB RESULTS: Recent Labs    07/05/2023 1349 07/15/2023 2324 07/17/23 0646  WBC 5.9 5.9 7.9  HGB 8.2* 7.5* 8.0*  HCT 24.1* 23.0* 24.3*  PLT 41* 41* 42*   BMET Recent Labs    07/13/2023 1349 07/17/23 0646  NA 137 137  K 5.0 4.3  CL 105 109  CO2 16* 16*  GLUCOSE 112* 68*  BUN 75* 78*  CREATININE 3.36* 3.24*  CALCIUM 8.0* 7.5*   LFT Recent Labs    07/12/2023 2324 07/17/23 0646  PROT 5.7* 5.5*  ALBUMIN 1.5* 1.7*  AST 24 23  ALT 14 13  ALKPHOS 104 88  BILITOT 4.5* 4.9*  BILIDIR 2.6*  --   IBILI 1.9*  --    PT/INR Recent Labs    07/19/2023 2324 07/17/23 0646  LABPROT 20.0* 19.3*  INR 1.7* 1.6*    STUDIES: US RENAL  Result Date: 08/01/2023 CLINICAL DATA:  AKI. EXAM: RENAL / URINARY  TRACT ULTRASOUND COMPLETE COMPARISON:  07/11/2021. FINDINGS: Right Kidney: Renal measurements: 9.9 x 4.4 x 4.0 cm = volume: 89.6 mL. Increased parenchymal echogenicity. No mass or hydronephrosis visualized. Left Kidney: Renal measurements: 10.4 x 4.4 x 4.4 cm = volume: 105.8 mL. Echogenicity within normal limits. No mass or hydronephrosis visualized. Bladder: Appears normal for degree of bladder distention. A left ureteral jet is seen. Other: Mild ascites is noted in the right upper quadrants and bilateral lower quadrants. IMPRESSION: 1. Increased renal parenchymal echogenicity on the right, which may be associated with medical renal disease. 2. Normal examination of the left kidney and urinary bladder. 3. Mild ascites. Electronically Signed   By: Thornell Sartorius M.D.   On: 07/28/2023 20:14   VAS [place] LOWER EXTREMITY VENOUS (DVT) (7a-7p)  Result Date: 07/30/2023  Lower Venous DVT Study Patient Name:  REJOICE COMISKEY Thal  Date of Exam:   07/13/2023 Medical Rec #: [id number]           Accession #:  [id number] Date of Birth: 05-20-1956            Patient Gender: F Patient Age:   47 years Exam Location:  [facility] Procedure:      VAS [place] LOWER EXTREMITY VENOUS (DVT) Referring Phys: Greta Doom TRAN --------------------------------------------------------------------------------  Indications: Pain, Swelling, and Bruising status post fall onto curb on left hip.  Limitations: Significant edema, especially throughout thigh, clothing, and pain with compression. Comparison Study: No prior study on file Performing Technologist: Sherren Kerns RVS  Examination Guidelines: A complete evaluation includes B-mode imaging, spectral Doppler, color Doppler, and power Doppler as needed of all accessible portions of each vessel. Bilateral testing is considered an integral part of a complete examination. Limited examinations for reoccurring indications may be performed as noted. The reflux portion of the exam is performed with the  patient in reverse Trendelenburg.  +-----+---------------+---------+-----------+----------+--------------+ RIGHTCompressibilityPhasicitySpontaneityPropertiesThrombus Aging +-----+---------------+---------+-----------+----------+--------------+ CFV  Full           Yes      Yes                                 +-----+---------------+---------+-----------+----------+--------------+   +---------+---------------+---------+-----------+----------+-------------------+ LEFT     CompressibilityPhasicitySpontaneityPropertiesThrombus Aging      +---------+---------------+---------+-----------+----------+-------------------+ CFV      Full           Yes      Yes                                      +---------+---------------+---------+-----------+----------+-------------------+ SFJ      Full                                                             +---------+---------------+---------+-----------+----------+-------------------+ FV Prox                 Yes      Yes                  patent by color and                                                       Doppler             +---------+---------------+---------+-----------+----------+-------------------+ FV Mid                  Yes      Yes                  patent by color and                                                       Doppler             +---------+---------------+---------+-----------+----------+-------------------+ FV Distal               Yes  Yes                  patent by color and                                                       Doppler             +---------+---------------+---------+-----------+----------+-------------------+ PFV                     Yes      Yes                  patent by color and                                                       Doppler             +---------+---------------+---------+-----------+----------+-------------------+ POP                      Yes      Yes                  patent by color and                                                       Doppler             +---------+---------------+---------+-----------+----------+-------------------+ PTV      Full                                                             +---------+---------------+---------+-----------+----------+-------------------+ PERO     Full                                                             +---------+---------------+---------+-----------+----------+-------------------+     Summary: RIGHT: - No evidence of common femoral vein obstruction.   LEFT: - There is no evidence of deep vein thrombosis in the lower extremity.  - No cystic structure found in the popliteal fossa.  *See table(s) above for measurements and observations. Electronically signed by Sherald Hess MD on 08/07/23 at 5:54:44 PM.    Final    CT HIP LEFT WO CONTRAST  Result Date: 2023-08-07 CLINICAL DATA:  Hip trauma, left hip pain, fall 8 days ago. EXAM: CT OF THE LEFT HIP WITHOUT CONTRAST TECHNIQUE: Multidetector CT imaging of the left hip was performed according to the standard protocol. Multiplanar CT image reconstructions were also generated. RADIATION DOSE REDUCTION: This exam was performed according to the departmental dose-optimization program which includes automated exposure control, adjustment of the mA and/or  kV according to patient size and/or use of iterative reconstruction technique. COMPARISON:  Radiographs 07-31-2023 FINDINGS: Bones/Joint/Cartilage No fracture or acute bony findings identified. No hip joint effusion. Ligaments Suboptimally assessed by CT. Muscles and Tendons Unremarkable Soft tissues 7.9 by 1.8 by 4.5 cm (volume = 34 cm^3) subcutaneous hematoma lateral to the left proximal femur. This does not immediately abut the superficial fascia margin and accordingly is probably not a Morel-Lavallee lesion. Adjacent subcutaneous and cutaneous  edema/bruising noted. Pelvic ascites noted. This has been shown on prior exams such as the MRI abdomen from 05/25/2023 and is likely ascribe a bowl to the patient's cirrhosis. Aortoiliac atherosclerotic vascular calcifications noted. Upper normal sized left external iliac lymph node at 0.9 cm. IMPRESSION: 1. 34 cubic cm subcutaneous hematoma lateral to the left proximal femur. 2. No fracture or acute bony findings. 3. Pelvic ascites, likely ascribable to the patient's known hepatic cirrhosis. 4. Aortoiliac atherosclerotic vascular calcifications. Aortic Atherosclerosis (ICD10-I70.0). Electronically Signed   By: Gaylyn Rong M.D.   On: 07-31-2023 16:10   DG CHEST PORT 1 VIEW  Result Date: 07-31-23 CLINICAL DATA:  Fall EXAM: PORTABLE CHEST 1 VIEW COMPARISON:  04/23/2021 FINDINGS: Cardiomegaly. Both lungs are clear. The visualized skeletal structures are unremarkable. IMPRESSION: Cardiomegaly. No acute abnormality of the lungs in portable projection. Electronically Signed   By: Jearld Lesch M.D.   On: July 31, 2023 15:23   CT Head Wo Contrast  Result Date: 07/31/23 CLINICAL DATA:  Larey Seat yesterday. Patient hit her head. Left-sided black eye. Loss of consciousness at time of fall. EXAM: CT HEAD WITHOUT CONTRAST CT MAXILLOFACIAL WITHOUT CONTRAST CT CERVICAL SPINE WITHOUT CONTRAST TECHNIQUE: Multidetector CT imaging of the head, cervical spine, and maxillofacial structures were performed using the standard protocol without intravenous contrast. Multiplanar CT image reconstructions of the cervical spine and maxillofacial structures were also generated. RADIATION DOSE REDUCTION: This exam was performed according to the departmental dose-optimization program which includes automated exposure control, adjustment of the mA and/or kV according to patient size and/or use of iterative reconstruction technique. COMPARISON:  None Available. FINDINGS: CT HEAD FINDINGS Brain: No evidence of acute infarction, hemorrhage,  hydrocephalus, extra-axial collection or mass lesion/mass effect. Vascular: No hyperdense vessel or unexpected calcification. Skull: Normal. Negative for fracture or focal lesion. Other: None. CT MAXILLOFACIAL FINDINGS Osseous: No fracture or mandibular dislocation. No destructive process. Orbits: Negative. No traumatic or inflammatory finding. Sinuses: Clear. Soft tissues: Small focus of soft tissue contusion lateral to the left globe and orbit. CT CERVICAL SPINE FINDINGS Alignment: Slight anterolisthesis of C4 on C5, degenerative. Straightened cervical lordosis. Skull base and vertebrae: No acute fracture. No primary bone lesion or focal pathologic process. Soft tissues and spinal canal: No prevertebral fluid or swelling. No visible canal hematoma. Disc levels: Well maintained disc spaces. No significant disc bulging. No evidence of a disc herniation. Bilateral facet degenerative changes. Upper chest: No acute findings. Chronic interstitial thickening noted at the lung apices. Other: None. IMPRESSION: HEAD CT 1. No acute intracranial abnormalities. MAXILLOFACIAL CT 1. No fracture or acute skeletal abnormality. 2. Small area of soft tissue contusion lateral to the left orbit. CERVICAL CT 1. No fracture or acute finding. Electronically Signed   By: Amie Portland M.D.   On: 2023-07-31 15:08   CT Cervical Spine Wo Contrast  Result Date: Jul 31, 2023 CLINICAL DATA:  Larey Seat yesterday. Patient hit her head. Left-sided black eye. Loss of consciousness at time of fall. EXAM: CT HEAD WITHOUT CONTRAST CT MAXILLOFACIAL WITHOUT CONTRAST CT CERVICAL SPINE WITHOUT CONTRAST TECHNIQUE:  Multidetector CT imaging of the head, cervical spine, and maxillofacial structures were performed using the standard protocol without intravenous contrast. Multiplanar CT image reconstructions of the cervical spine and maxillofacial structures were also generated. RADIATION DOSE REDUCTION: This exam was performed according to the departmental  dose-optimization program which includes automated exposure control, adjustment of the mA and/or kV according to patient size and/or use of iterative reconstruction technique. COMPARISON:  None Available. FINDINGS: CT HEAD FINDINGS Brain: No evidence of acute infarction, hemorrhage, hydrocephalus, extra-axial collection or mass lesion/mass effect. Vascular: No hyperdense vessel or unexpected calcification. Skull: Normal. Negative for fracture or focal lesion. Other: None. CT MAXILLOFACIAL FINDINGS Osseous: No fracture or mandibular dislocation. No destructive process. Orbits: Negative. No traumatic or inflammatory finding. Sinuses: Clear. Soft tissues: Small focus of soft tissue contusion lateral to the left globe and orbit. CT CERVICAL SPINE FINDINGS Alignment: Slight anterolisthesis of C4 on C5, degenerative. Straightened cervical lordosis. Skull base and vertebrae: No acute fracture. No primary bone lesion or focal pathologic process. Soft tissues and spinal canal: No prevertebral fluid or swelling. No visible canal hematoma. Disc levels: Well maintained disc spaces. No significant disc bulging. No evidence of a disc herniation. Bilateral facet degenerative changes. Upper chest: No acute findings. Chronic interstitial thickening noted at the lung apices. Other: None. IMPRESSION: HEAD CT 1. No acute intracranial abnormalities. MAXILLOFACIAL CT 1. No fracture or acute skeletal abnormality. 2. Small area of soft tissue contusion lateral to the left orbit. CERVICAL CT 1. No fracture or acute finding. Electronically Signed   By: Amie Portland M.D.   On: 08/01/2023 15:08   CT Maxillofacial Wo Contrast  Result Date: 07/07/2023 CLINICAL DATA:  Larey Seat yesterday. Patient hit her head. Left-sided black eye. Loss of consciousness at time of fall. EXAM: CT HEAD WITHOUT CONTRAST CT MAXILLOFACIAL WITHOUT CONTRAST CT CERVICAL SPINE WITHOUT CONTRAST TECHNIQUE: Multidetector CT imaging of the head, cervical spine, and  maxillofacial structures were performed using the standard protocol without intravenous contrast. Multiplanar CT image reconstructions of the cervical spine and maxillofacial structures were also generated. RADIATION DOSE REDUCTION: This exam was performed according to the departmental dose-optimization program which includes automated exposure control, adjustment of the mA and/or kV according to patient size and/or use of iterative reconstruction technique. COMPARISON:  None Available. FINDINGS: CT HEAD FINDINGS Brain: No evidence of acute infarction, hemorrhage, hydrocephalus, extra-axial collection or mass lesion/mass effect. Vascular: No hyperdense vessel or unexpected calcification. Skull: Normal. Negative for fracture or focal lesion. Other: None. CT MAXILLOFACIAL FINDINGS Osseous: No fracture or mandibular dislocation. No destructive process. Orbits: Negative. No traumatic or inflammatory finding. Sinuses: Clear. Soft tissues: Small focus of soft tissue contusion lateral to the left globe and orbit. CT CERVICAL SPINE FINDINGS Alignment: Slight anterolisthesis of C4 on C5, degenerative. Straightened cervical lordosis. Skull base and vertebrae: No acute fracture. No primary bone lesion or focal pathologic process. Soft tissues and spinal canal: No prevertebral fluid or swelling. No visible canal hematoma. Disc levels: Well maintained disc spaces. No significant disc bulging. No evidence of a disc herniation. Bilateral facet degenerative changes. Upper chest: No acute findings. Chronic interstitial thickening noted at the lung apices. Other: None. IMPRESSION: HEAD CT 1. No acute intracranial abnormalities. MAXILLOFACIAL CT 1. No fracture or acute skeletal abnormality. 2. Small area of soft tissue contusion lateral to the left orbit. CERVICAL CT 1. No fracture or acute finding. Electronically Signed   By: Amie Portland M.D.   On: 07/25/2023 15:08   DG Knee Complete 4 Views Left  Result Date:  07/15/2023 CLINICAL DATA:  Fall 8 days ago.  Left knee pain. EXAM: LEFT KNEE - COMPLETE 4+ VIEW COMPARISON:  None Available. FINDINGS: No fracture or acute finding.  No bone lesion. Joint space narrowing, most evident of the medial compartment, with tricompartmental marginal osteophytes consistent with osteoarthritis. No convincing joint effusion. Soft tissues are unremarkable. IMPRESSION: No fracture or dislocation Electronically Signed   By: Amie Portland M.D.   On: 07/25/2023 14:51   DG Hip Unilat W or Wo Pelvis 2-3 Views Left  Result Date: 07/15/2023 CLINICAL DATA:  Fall 8 days ago.  Left hip pain. EXAM: DG HIP (WITH OR WITHOUT PELVIS) 2-3V LEFT COMPARISON:  None Available. FINDINGS: No fracture or bone lesion. Hip joints, SI joints and pubic symphysis normally spaced and aligned. Soft tissue edema is noted lateral to the proximal left femur/hip joint. IMPRESSION: No fracture or dislocation. Electronically Signed   By: Amie Portland M.D.   On: 07/20/2023 14:50   DG Hand Complete Left  Result Date: 07/24/2023 CLINICAL DATA:  Fall 8 days ago.  Hand pain. EXAM: LEFT HAND - COMPLETE 3+ VIEW COMPARISON:  None Available. FINDINGS: No convincing acute fracture. Old healed fractures of distal radius and likely of the index finger proximal phalanx. Joints are normally aligned. Skeletal structures are diffusely demineralized. Soft tissues are unremarkable. IMPRESSION: 1. No acute fracture or dislocation. Electronically Signed   By: Amie Portland M.D.   On: 07/17/2023 14:49   DG Elbow Complete Right  Result Date: 07/24/2023 CLINICAL DATA:  Fall 8 days ago.  Right elbow pain. EXAM: RIGHT ELBOW - COMPLETE 3+ VIEW COMPARISON:  None Available. FINDINGS: No convincing acute fracture. Deformity of the radial head suggests an old, healed fracture. Elbow joint is normally aligned. No convincing joint effusion. Posterior soft tissue swelling. IMPRESSION: 1. No convincing acute fracture.  No dislocation. 2. Posterior  soft tissue swelling. Electronically Signed   By: Amie Portland M.D.   On: 07/24/2023 14:48      Impression/Plan:   Acute on chronic anemia in setting of fall with large hematoma, FOBT+ stool, melena 07/17/2023  HGB 8.0 down 2 HGB from 8 days prior  07/23/2023 Iron 21 Ferritin 219  09/15, s/p 1 PRBC and 1 platelets  Patient with coagulopathy has received vitamin K -While some of the new anemia may be from large hematoma, patient is also been taking Aleve 3 times a day for the past week with melena, some concern for gastritis, CT with large variceals, do not believe this is a variceal bleed but will start patient on proton pump infusion and octreotide to prevent worsening bleeding as well as potentially help kidney function. --Continue to monitor H&H with transfusion as needed to maintain hemoglobin greater than 7. -EGD to evaluate portal HTN/varices/gastritis, will need colonoscopy at some point to evaluate IDA- timing per Dr. Reginia Forts for endoscopic procedures, we have not time today unless patient has unstable bleeding I thoroughly discussed the procedure to include nature, alternatives, benefits, and risks including but not limited to bleeding, perforation, infection, anesthesia/cardiac and pulmonary complications. Patient provides understanding and gave verbal consent to proceed.  Decompensated cirrhosis likely secondary to ETOH/fall AST 23 ALT 13 Alkphos 88 TBili 4.9 INR 07/17/2023 1.6  MELD 3.0: 31 at 07/17/2023  6:46 AM MELD-Na: 29 at 07/17/2023  6:46 AM -Most likely secondary to alcohol -Pending acute hepatitis panel, will check for hepatitis immunity, Will get AMA, ASMA - Serial INR, CBC, CMET daily. - Daily MELD  Acute  renal failure possibly due to hepatorenal, more likely secondary to blood loss/azotemia BUN 78 Cr 3.24  GFR 15  Potassium 4.3  Magnesium 2.2  -Avoid large volume paracentesis -Diuretics on hold, DC antihypertensives -Avoid nephrotoxic drugs -1 g/kg per day of  Albumin (max 100 g) for 2 days.  -Will start patient on octreotide for melena in setting of cirrhosis with large varices seen on CT however octreotide may also help kidney perfusion.  Ascites Ascites on exam.  -Will schedule for diagnostic and therapeutic paracentesis. -Diuretics currently on hold due to renal function, continue to monitor renal function.  -With severe CKD history, will do max of 1-2 L -Please send for cell count with differential, albumin concentration, total protein concentration. - Will empirically treat with ceftriaxone in setting of potential GI bleed.  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  -IV Protonix Bolus & Drip  -IV Ceftriaxone 2g x1 now after paracentesis (if varices noted then will continue treatment for at least 5 days) -Will plan for tentative EGD, time pending - trend CBC -Continue volume resuscitation with Hgb goal >7 (higher Hgb rates have been associated with worsened mortality in the setting of portal hypertensive variceal bleeds); unless active bleeding is occuring -INR Goal <2.0 (may proceed with FFP if necessary and give IV Vitamin K 10 to optimize prior to procedure) -Monitor I/Os closely  Alcohol abuse -Empiric treatment with folic acid, multivitamin and thiamine.  -Alcohol Abstinence counseling discussed with patient -Monitor for withdrawal symptoms while inpatient   Hepatic Encephalopathy:  07/08/2023 Ammonia 109 Possible trigger: GI Bleed/fall/ETOH - Lactulose 30ml BID or TID titrate to 3bms per day - Rifaximin 550mg  bid - minimize/remove all benzos and narcotics -Patient advised to not drive due to impaired reflexes - add on zinc  Thrombocytopenia secondary to above and likely ETOH  use/nutrition Platelets 42 S/p 1 platelets  with Coagulopathy possible nutritional component 07/17/2023 INR 1.6 Vitamin K 5 mg once 09/15 Continue to monitor INR daily, vitamin K as needed  Folate def Replace with folic acid   Principal Problem:   Blood loss anemia Active Problems:   Rheumatoid arthritis (HCC)   Alcohol use disorder   Thrombocytopenia (HCC)   GERD (gastroesophageal reflux disease)   Tobacco abuse   Bipolar 2 disorder (HCC)   Prolonged QT interval   Cardiomegaly   Occult blood positive stool   Acute encephalopathy    LOS: 1 day   Thank you for your kind consultation, we will continue to follow. Further recommendations per Dr. Nelly Laurence  07/17/2023, 9:23 AM   Attending physician's note  I have taken a history, reviewed the chart and examined the patient. I performed a substantive portion of this encounter, including complete performance of at least one of the key components, in conjunction with the APP. I agree with the APP's note, impression and recommendations.   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  PPI gtt  Patient is currently hemodynamically stable  Plan for EGD tomorrow to evaluate and intervene endoscopically if needed. Given risk for possible variceal hemorrhage, will hold off colonoscopy /bowel prep at this point, can be done later if needed, if patient has no obvious source of GI bleed on EGD  AKI: IV albumin and octreotide Consider nephrology consult, cannot exclude hepatorenal syndrome  Ascites: No safe pocket for diagnostic tap IV ceftriaxone for SBP prophylaxis  Hepatic  encephalopathy: Continue Xifaxan and lactulose    The patient was provided an opportunity to ask questions and all were answered. The patient agreed with the plan and demonstrated an understanding of the instructions.  Iona Beard , MD [phone]

## 2023-07-17 NOTE — Progress Notes (Addendum)
HOSPITALIST ROUNDING NOTE Erika [name] ZOX:[id number]  DOB: 27-Oct-1956  DOA: 07/21/2023  PCP: Claiborne Rigg, NP  07/17/2023,9:03 AM   LOS: 1 day      Code Status: Full code   From:  home    Current Dispo:  unclear     67 year old Seropositive CCP erosive rheumatoid arthritis followed by [facility] Dr. 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  LOC Imaging CT cervical spine no fracture small left-sided contusion cervical CT negative head CT normal CXR negative CT left hip 34 mm left lateral hematoma with pelvic ascites US renal increased renal parenchyma on the right medical renal disease normal left kidney, mild ascites Knee x-rays no fracture left hip x-ray no fracture left hand x-ray no fracture right elbow no convincing acute fracture no dislocation posterior soft tissue swelling  Came to ED and hypotensive, hemoglobin trended down from 10-8 found to have electrolyte abnormalities with AKI gap acidosis and platelets of 40 Imaging showed large hematoma--also Hemoccult.positive stool--GI was consulted because of cirrhosis 1 unit of blood, albumin, 1 packet of platelets bolused IV fluids 1 L  Plan  Hypovolemic, distributive shock [early] on admission from 34 cm cubic hematoma Albumin 1 dose given 1 packet of platelets and 1 unit PRBC has been given repeat hemoglobin every 6.  Risk for further bleeding given low platelets  Acute blood loss anemia secondary to fall with hematoma, possible/questionable  component of heme positive stool Transfuse if hemoglobin drops below 7--q 8 checks Tells me occasional dark stool--but isnt very clear on whether this is constant vs not GI consulted as below ?  Eventual scope?  IV iron eventually when more hemodynamically stable  Fall on admission Main issue is left hip and right elbow-right elbow seems to have some swelling and range of motion is limited Therapy to eval-would elevate and reconsider imaging if still painful in a.m.-  Alcoholic cirrhosis with complication-pancytopenia-platelets in the 40s Meldl-NA 29, 3 mo mortality 81.1 Also moderate ascites---if any fever, would get paracentesis once pressures are reliably up---hold abx for nbow Hold diuretics given hypovolemia May require more platelet transfusion if hemoglobin continues to drop Will give vitamin K IV x 1  AKI secondary to blood loss anemia acute as above Change fluids to bicarb given low CO2, Electrolyte abnormalities noted-calcium corrects with albumin  Rheumatoid arthritis--has outpatient follow-up scheduled-hold Lyrica for now given AKI  Prediabetic CBGs actually quite low-allow patient to eat  Depression-start lower dose of antidepressants given risk of encephalopathy in the setting of AKI   DVT prophylaxis: SCD  Status is: Inpatient Remains inpatient appropriate because:   Unstable for discharge    Subjective: Coherent significant pain in right elbow no distress Main issue is right elbow pain She tells me she has occasional dark stool but cannot quantify further-although she is coherent she is a poor historian-apparently she lives with her son and gives herself her own meds Confirms her last drink was several weeks ago-blood alcohol level confirmed to be low  Objective + exam Vitals:   07/17/23 0343 07/17/23 0455 07/17/23  0600 07/17/23 0842  BP: (!) 84/41 (!) 81/48 (!) 91/52 (!) 102/45  Pulse: 69 72 74 76  Resp:  20 20 16   Temp: 98.1 F (36.7 C) 98.1 F  (36.7 C) 98.2 F (36.8 C) 97.9 F (36.6 C)  TempSrc: Oral Oral Oral Oral  SpO2: 98% 100% 100% 99%  Weight:      Height:       Filed Weights   07/11/2023 1328  Weight: 65.8 kg    Examination:  Ill-appearing white female bruising to left face Chest is clear no wheeze S2 no murmur but PVCs noted abdomen is distended no rebound no guarding cannot appreciate HSM Large hematoma in left hip Multiple amputations of toes all seem clean Trace edema  Data Reviewed: reviewed   CBC    Component Value Date/Time   WBC 7.9 07/17/2023 0646   RBC 2.64 (L) 07/17/2023 0646   HGB 8.0 (L) 07/17/2023 0646   HGB 10.7 (L) 07/08/2023 1629   HCT 24.3 (L) 07/17/2023 0646   HCT 31.6 (L) 07/08/2023 1629   PLT 42 (L) 07/17/2023 0646   PLT 71 (LL) 07/08/2023 1629   MCV 92.0 07/17/2023 0646   MCV 95 07/08/2023 1629   MCH 30.3 07/17/2023 0646   MCHC 32.9 07/17/2023 0646   RDW 15.2 07/17/2023 0646   RDW 13.4 07/08/2023 1629   LYMPHSABS 0.5 (L) 07/03/2023 1349   LYMPHSABS 0.6 (L) 07/08/2023 1629   MONOABS 0.3 07/04/2023 1349   EOSABS 0.0 07/09/2023 1349   EOSABS 0.0 07/08/2023 1629   BASOSABS 0.0 07/27/2023 1349   BASOSABS 0.0 07/08/2023 1629      Latest Ref Rng & Units 07/17/2023    6:46 AM 07/08/2023   11:24 PM 07/21/2023    1:49 PM  CMP  Glucose 70 - 99 mg/dL 68   086   BUN 8 - 23 mg/dL 78   75   Creatinine 5.78 - 1.00 mg/dL 4.69   6.29   Sodium 528 - 145 mmol/L 137   137   Potassium 3.5 - 5.1 mmol/L 4.3   5.0   Chloride 98 - 111 mmol/L 109   105   CO2 22 - 32 mmol/L 16   16   Calcium 8.9 - 10.3 mg/dL 7.5   8.0   Total Protein 6.5 - 8.1 g/dL 5.5  5.7    Total Bilirubin 0.3 - 1.2 mg/dL 4.9  4.5    Alkaline Phos 38 - 126 U/L 88  104    AST 15 - 41 U/L 23  24    ALT 0 - 44 U/L 13  14       Scheduled Meds:  nicotine  7 mg Transdermal Daily   pantoprazole (PROTONIX) IV  40 mg Intravenous Q12H   thiamine (VITAMIN B1) injection  100 mg Intravenous Q24H   Continuous Infusions:  sodium  chloride 75 mL/hr at 07/17/23 0731    Time 60  Rhetta Mura, MD  [facility]

## 2023-07-17 NOTE — Plan of Care (Signed)
  Problem: Education: Goal: Knowledge of General Education information will improve Description Including pain rating scale, medication(s)/side effects and non-pharmacologic comfort measures Outcome: Progressing   

## 2023-07-18 ENCOUNTER — Inpatient Hospital Stay (HOSPITAL_COMMUNITY): Payer: 59

## 2023-07-18 ENCOUNTER — Other Ambulatory Visit: Payer: Self-pay

## 2023-07-18 ENCOUNTER — Encounter (HOSPITAL_COMMUNITY): Payer: Self-pay | Admitting: Internal Medicine

## 2023-07-18 ENCOUNTER — Encounter (HOSPITAL_COMMUNITY): Admission: EM | Disposition: E | Payer: Self-pay | Source: Home / Self Care | Attending: Pulmonary Disease

## 2023-07-18 DIAGNOSIS — K259 Gastric ulcer, unspecified as acute or chronic, without hemorrhage or perforation: Secondary | ICD-10-CM | POA: Diagnosis not present

## 2023-07-18 DIAGNOSIS — K7031 Alcoholic cirrhosis of liver with ascites: Secondary | ICD-10-CM

## 2023-07-18 DIAGNOSIS — I8510 Secondary esophageal varices without bleeding: Secondary | ICD-10-CM

## 2023-07-18 DIAGNOSIS — K766 Portal hypertension: Secondary | ICD-10-CM | POA: Diagnosis not present

## 2023-07-18 DIAGNOSIS — K254 Chronic or unspecified gastric ulcer with hemorrhage: Secondary | ICD-10-CM

## 2023-07-18 DIAGNOSIS — K269 Duodenal ulcer, unspecified as acute or chronic, without hemorrhage or perforation: Secondary | ICD-10-CM

## 2023-07-18 DIAGNOSIS — D500 Iron deficiency anemia secondary to blood loss (chronic): Secondary | ICD-10-CM | POA: Diagnosis not present

## 2023-07-18 DIAGNOSIS — I10 Essential (primary) hypertension: Secondary | ICD-10-CM | POA: Diagnosis not present

## 2023-07-18 DIAGNOSIS — I8500 Esophageal varices without bleeding: Secondary | ICD-10-CM

## 2023-07-18 DIAGNOSIS — N179 Acute kidney failure, unspecified: Principal | ICD-10-CM

## 2023-07-18 HISTORY — PX: BIOPSY: SHX5522

## 2023-07-18 HISTORY — PX: ESOPHAGOGASTRODUODENOSCOPY (EGD) WITH PROPOFOL: SHX5813

## 2023-07-18 LAB — CBC
HCT: 25.1 % — ABNORMAL LOW (ref 36.0–46.0)
Hemoglobin: 8.6 g/dL — ABNORMAL LOW (ref 12.0–15.0)
MCH: 30.7 pg (ref 26.0–34.0)
MCHC: 34.3 g/dL (ref 30.0–36.0)
MCV: 89.6 fL (ref 80.0–100.0)
Platelets: 36 10*3/uL — ABNORMAL LOW (ref 150–400)
RBC: 2.8 MIL/uL — ABNORMAL LOW (ref 3.87–5.11)
RDW: 15.5 % (ref 11.5–15.5)
WBC: 7.7 10*3/uL (ref 4.0–10.5)
nRBC: 0 % (ref 0.0–0.2)

## 2023-07-18 LAB — BPAM RBC
Blood Product Expiration Date: 202409202359
ISSUE DATE / TIME: 202409150234
Unit Type and Rh: 9500

## 2023-07-18 LAB — RAPID URINE DRUG SCREEN, HOSP PERFORMED
Amphetamines: NOT DETECTED
Barbiturates: NOT DETECTED
Benzodiazepines: NOT DETECTED
Cocaine: POSITIVE — AB
Opiates: NOT DETECTED
Tetrahydrocannabinol: POSITIVE — AB

## 2023-07-18 LAB — TYPE AND SCREEN
ABO/RH(D): O NEG
Antibody Screen: NEGATIVE
Unit division: 0

## 2023-07-18 LAB — AFP TUMOR MARKER: AFP, Serum, Tumor Marker: <1.8 ng/mL (ref 0.0–9.2)

## 2023-07-18 LAB — CREATININE, URINE, RANDOM: Creatinine, Urine: 135 mg/dL

## 2023-07-18 LAB — BASIC METABOLIC PANEL
Anion gap: 11 (ref 5–15)
BUN: 76 mg/dL — ABNORMAL HIGH (ref 8–23)
CO2: 20 mmol/L — ABNORMAL LOW (ref 22–32)
Calcium: 6.9 mg/dL — ABNORMAL LOW (ref 8.9–10.3)
Chloride: 104 mmol/L (ref 98–111)
Creatinine, Ser: 3.01 mg/dL — ABNORMAL HIGH (ref 0.44–1.00)
GFR, Estimated: 16 mL/min — ABNORMAL LOW (ref >60)
Glucose, Bld: 74 mg/dL (ref 70–99)
Potassium: 4.2 mmol/L (ref 3.5–5.1)
Sodium: 135 mmol/L (ref 135–145)

## 2023-07-18 LAB — PROTIME-INR
INR: 1.7 — ABNORMAL HIGH (ref 0.8–1.2)
Prothrombin Time: 20.3 s — ABNORMAL HIGH (ref 11.4–15.2)

## 2023-07-18 LAB — OSMOLALITY, URINE: Osmolality, Ur: 423 mosm/kg (ref 300–900)

## 2023-07-18 LAB — SODIUM, URINE, RANDOM: Sodium, Ur: 11 mmol/L

## 2023-07-18 SURGERY — ESOPHAGOGASTRODUODENOSCOPY (EGD) WITH PROPOFOL
Anesthesia: Monitor Anesthesia Care

## 2023-07-18 MED ORDER — ALBUMIN HUMAN 5 % IV SOLN
12.5000 g | Freq: Once | INTRAVENOUS | Status: AC
  Administered 2023-07-18: 12.5 g via INTRAVENOUS

## 2023-07-18 MED ORDER — LIDOCAINE 2% (20 MG/ML) 5 ML SYRINGE
INTRAMUSCULAR | Status: DC | PRN
  Administered 2023-07-18: 60 mg via INTRAVENOUS

## 2023-07-18 MED ORDER — SODIUM CHLORIDE 0.9 % IV SOLN
125.0000 mg | Freq: Once | INTRAVENOUS | Status: AC
  Administered 2023-07-18: 125 mg via INTRAVENOUS
  Filled 2023-07-18: qty 10

## 2023-07-18 MED ORDER — PROPOFOL 10 MG/ML IV BOLUS
INTRAVENOUS | Status: DC | PRN
  Administered 2023-07-18: 30 mg via INTRAVENOUS
  Administered 2023-07-18: 80 mg via INTRAVENOUS
  Administered 2023-07-18: 10 mg via INTRAVENOUS
  Administered 2023-07-18 (×4): 20 mg via INTRAVENOUS

## 2023-07-18 MED ORDER — PROPOFOL 10 MG/ML IV BOLUS: INTRAVENOUS | Status: DC | PRN

## 2023-07-18 MED ORDER — SODIUM CHLORIDE 0.9 % IV SOLN
INTRAVENOUS | Status: AC | PRN
Start: 2023-07-18 — End: 2023-07-18
  Administered 2023-07-18: 500 mL via INTRAVENOUS

## 2023-07-18 MED ORDER — ALBUMIN HUMAN 5 % IV SOLN
INTRAVENOUS | Status: AC
  Filled 2023-07-18: qty 250

## 2023-07-18 MED ORDER — GLYCOPYRROLATE 0.2 MG/ML IJ SOLN
INTRAMUSCULAR | Status: DC | PRN
  Administered 2023-07-18: .2 mg via INTRAVENOUS

## 2023-07-18 MED ORDER — PANTOPRAZOLE SODIUM 40 MG PO TBEC
40.0000 mg | DELAYED_RELEASE_TABLET | Freq: Two times a day (BID) | ORAL | Status: DC
  Administered 2023-07-18 – 2023-07-21 (×7): 40 mg via ORAL
  Filled 2023-07-18 (×7): qty 1

## 2023-07-18 MED ORDER — PHENYLEPHRINE HCL (PRESSORS) 10 MG/ML IV SOLN
INTRAVENOUS | Status: DC | PRN
  Administered 2023-07-18 (×3): 160 ug via INTRAVENOUS
  Administered 2023-07-18: 240 ug via INTRAVENOUS

## 2023-07-18 MED ORDER — SODIUM CHLORIDE 0.9 % IV SOLN: INTRAVENOUS | Status: DC

## 2023-07-18 SURGICAL SUPPLY — 15 items

## 2023-07-18 NOTE — Plan of Care (Signed)
Problem: Elimination: Goal: Will not experience complications related to bowel motility Outcome: Progressing   Problem: Safety: Goal: Ability to remain free from injury will improve Outcome: Progressing

## 2023-07-18 NOTE — Anesthesia Preprocedure Evaluation (Signed)
Anesthesia Evaluation  Patient identified by MRN, date of birth, ID band Patient awake    Reviewed: Allergy & Precautions, NPO status , Patient's Chart, lab work & pertinent test results, reviewed documented beta blocker date and time   History of Anesthesia Complications Negative for: history of anesthetic complications  Airway Mallampati: III  TM Distance: >3 FB Neck ROM: Limited    Dental  (+) Edentulous Upper, Edentulous Lower   Pulmonary neg shortness of breath, neg sleep apnea, neg COPD, Current Smoker and Patient abstained from smoking.    + decreased breath sounds      Cardiovascular hypertension, (-) CAD, (-) Past MI, (-) Cardiac Stents, (-) CABG and (-) Peripheral Vascular Disease + Valvular Problems/Murmurs (mod TR)  Rhythm:Regular Rate:Normal     Neuro/Psych neg Seizures PSYCHIATRIC DISORDERS Anxiety Depression Bipolar Disorder      GI/Hepatic ,GERD  ,,(+) Cirrhosis   Esophageal Varices  substance abuse  alcohol use  Endo/Other    Renal/GU CRFRenal disease     Musculoskeletal  (+) Arthritis ,    Abdominal   Peds  Hematology  (+) Blood dyscrasia, anemia   Anesthesia Other Findings   Reproductive/Obstetrics                              Anesthesia Physical Anesthesia Plan  ASA: 4  Anesthesia Plan: MAC   Post-op Pain Management:    Induction:   PONV Risk Score and Plan: 1 and Ondansetron and Propofol infusion  Airway Management Planned:   Additional Equipment:   Intra-op Plan:   Post-operative Plan:   Informed Consent: I have reviewed the patients History and Physical, chart, labs and discussed the procedure including the risks, benefits and alternatives for the proposed anesthesia with the patient or authorized representative who has indicated his/her understanding and acceptance.     Dental advisory given  Plan Discussed with: CRNA  Anesthesia Plan  Comments:          Anesthesia Quick Evaluation

## 2023-07-18 NOTE — Transfer of Care (Signed)
Immediate Anesthesia Transfer of Care Note  Patient: Erika [name]  Procedure(s) Performed: ESOPHAGOGASTRODUODENOSCOPY (EGD) WITH PROPOFOL BIOPSY  Patient Location: Endoscopy Unit  Anesthesia Type:MAC  Level of Consciousness: drowsy and patient cooperative  Airway & Oxygen Therapy: Patient Spontanous Breathing and Patient connected to nasal cannula oxygen  Post-op Assessment: Report given to RN  Post vital signs: Reviewed and stable  Last Vitals:  Vitals Value Taken Time  BP 76/43 07/24/2023 1202  Temp    Pulse 84 07/31/2023 1203  Resp 14 07/04/2023 1203  SpO2 93 % 07/14/2023 1203  Vitals shown include unfiled device data.  Last Pain:  Vitals:   07/03/2023 1058  TempSrc: Temporal  PainSc: 3       Patients Stated Pain Goal: 0 (07/17/23 2106)  Complications: No notable events documented.

## 2023-07-18 NOTE — Progress Notes (Signed)
Initial Nutrition Assessment  DOCUMENTATION CODES:  Not applicable  INTERVENTION:  Adjust to 2g Na sodium diet for additional dining options Ensure Enlive po BID, each supplement provides 350 kcal and 20 grams of protein. MVI with minerals daily  NUTRITION DIAGNOSIS:  Inadequate oral intake related to decreased appetite as evidenced by meal completion < 50%.  GOAL:  Patient will meet greater than or equal to 90% of their needs  MONITOR:  PO intake, Supplement acceptance, Labs  REASON FOR ASSESSMENT:  Consult Assessment of nutrition requirement/status  ASSESSMENT:  Pt with hx of GED, HTN, EtOH abuse, and cirrhosis presented to ED with left hip swelling after a fall last week. Pt also reported dark stools. Imaging showed a subcutaneous hematoma to her left hip and low Hgb.  9/16 - EGD, shows grade II varices, non-bleeding gastric ulcers, and non-bleeding duodenal ulcers  Pt with therapy at first attempted visit and with MD at second. Will follow-up for hx and physical exam as able.   Discussed intake with RN. States that appetite today has been pretty good. Does not report any concerns with pt's nutrition status. However note periods of low glucose on morning labs. Will add nutrition supplements to encourage better PO.  Current/Admit weight: 65.8 kg  Average Meal Intake: 9/15: 33% intake x 3 recorded meals  Nutritionally Relevant Medications: Scheduled Meds:  pantoprazole  40 mg Oral BID   thiamine (VITAMIN B1)   100 mg Intravenous Q24H   Continuous Infusions:  sodium chloride 75 mL/hr at 07/19/23 1157   octreotide (SANDOSTATIN) 500 mcg in sodium chloride 0.9 % 250 mL (2 mcg/mL) infusion 50 mcg/hr (07/19/23 1021)   Labs Reviewed: Na 134 BUN 75, creatinine 3.2 CBG ranges from 72-74 mg/dL over the last 24 hours  NUTRITION - FOCUSED PHYSICAL EXAM: Defer to follow-up appointment  Diet Order:   Diet Order             Diet Heart Fluid consistency: Thin  Diet  effective now                   EDUCATION NEEDS:  Not appropriate for education at this time  Skin:  Skin Assessment: Reviewed RN Assessment  Last BM:  9/15 - type 2  Height:  Ht Readings from Last 1 Encounters:  07/22/2023 5\' 1"  (1.549 m)    Weight:  Wt Readings from Last 1 Encounters:  07/14/2023 65.8 kg    Ideal Body Weight:  47.7 kg  BMI:  Body mass index is 27.4 kg/m.  Estimated Nutritional Needs:  Kcal:  1400-1600 kcal/d Protein:  70-90g/d Fluid:  1.5L/d    Greig Castilla, RD, LDN Clinical Dietitian RD pager # available in [facility]  After hours/weekend pager # available in [facility]

## 2023-07-18 NOTE — Progress Notes (Addendum)
HOSPITALIST ROUNDING NOTE Erika [name] ZOX:[id number]  DOB: [birth year]  DOA: August 14, 2023  PCP: Claiborne Rigg, NP  07/23/2023,2:42 PM   LOS: 2 days      Code Status: Full code   From:  home    Current Dispo:  unclear     67 year old Seropositive CCP erosive rheumatoid arthritis followed by [facility] Rheum Dr. 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  LOC Imaging CT cervical spine no fracture small left-sided contusion cervical CT negative head CT normal CXR negative CT left hip 34 mm left lateral hematoma with pelvic ascites  US renal increased renal parenchyma on the right medical renal disease normal left kidney, mild ascites Knee x-rays no fracture left hip x-ray no fracture left hand x-ray no fracture right elbow no convincing acute fracture no dislocation posterior soft tissue swelling  Came to ED and hypotensive, hemoglobin trended down from 10-8 found to have electrolyte abnormalities with AKI gap acidosis and platelets of 40 Imaging showed large hematoma--also Hemoccult.positive stool--GI was consulted because of cirrhosis 1 unit of blood, albumin, 1 packet of platelets bolused IV fluids 1 L 9/15 paracentesis attempted --not enough fluid 9/16 EGD = grade 2 varices no overt bleeding, nonbleeding ulcers, portal hypertensive gastropathy,?  PUD--- likely    Plan  Hypovolemic, distributive shock [early] on admission from 34 cm cubic hematoma  Albumin platelets PRBC given--hemoglobin stabilized-stop every  6 checks vitamin K given x 1  Acute blood loss anemia secondary to fall with hematoma, possible/questionable component of heme positive stool Give IV iron tomorrow once we can confirm no need for antibiotics EGD as above but no overt bleed, Sandostatin for 24 hours stop Xifaxan, ceftriaxone at this time  Fall on admission Main issue is left hip and right elbow-right elbow seems to have some swelling Still quite antalgic, will CT and consult Ortho if any ligamentous injury  Alcoholic cirrhosis with complication-pancytopenia-platelets 30's IgM HBc +, all other labs negative, suspect false positive we will confer with GI Meldl-NA 29, 3 mo mortality 81.1 Watch carefully for bleeding-no indication yet for PRBC or further platelets She is not encephalopathic despite high ammonia-  Acute urinary retention causing AKI secondary to blood loss anemia acute as above US renal as above--urine studies pending since admission for some reason-Place Foley-will need voiding trial in 24 to 48 hours but may require catheter Bicarb gtt. 150 mEq 100 cc/H Acidosis correcting If no improvement will speak to renal in a.m.  Electrolyte abnormalities noted-calcium corrects with albumin  Rheumatoid arthritis--has outpatient follow-up scheduled-hold Lyrica for now given AKI  Prediabetic CBGs  70 and 120 not eating much  Depression-start lower dose of antidepressants given risk of encephalopathy in the setting of AKI   Spoke with on [name] [phone]  DVT prophylaxis: SCD  Status is: Inpatient Remains inpatient appropriate because:   Unstable for discharge    Subjective:  Just back from Endo Somewhat tearful-realizes that she should not be drinking anymore No pain no fever no chills other than in her right elbow which is quite antalgic Pictures of hip in  chart She hardly had lunch prefers grapes no chest pain She is retaining >350 cc  Objective + exam Vitals:   07/08/2023 1240 07/03/2023 1245  07/13/2023 1255 07/31/2023 1346  BP: (!) 82/42 (!) 92/45 (!) 117/55 (!) 90/42  Pulse: 83 83 83 83  Resp: 15 16 (!) 21 18  Temp:    98.6 F (37 C)  TempSrc:      SpO2: 96% 94% 96% 96%  Weight:      Height:       Filed Weights   07/26/2023 1328  Weight: 65.8 kg    Examination:  Ill-appearing female looking older than stated age--clinically icteric Neck soft supple Chest clear Restricted ROM to right elbow with diffuse swelling and tenderness at medial epicondyle-cannot extend completely slight bruising S1-S2 mild tachycardia Abdomen distended slightly tender cannot appreciate HSM, fullness in lower quadrant Hip exam on left side   Data Reviewed: reviewed   CBC    Component Value Date/Time   WBC 7.7 07/10/2023 0746   RBC 2.80 (L) 07/22/2023 0746   HGB 8.6 (L) 07/09/2023 0746   HGB 10.7 (L) 07/08/2023 1629   HCT 25.1 (L) 07/30/2023 0746   HCT 31.6 (L) 07/08/2023 1629   PLT 36 (L) 07/20/2023 0746   PLT 71 (LL) 07/08/2023 1629   MCV 89.6 07/11/2023 0746   MCV 95 07/08/2023 1629   MCH 30.7 07/10/2023 0746   MCHC 34.3 07/15/2023 0746   RDW 15.5 07/07/2023 0746   RDW 13.4 07/08/2023 1629   LYMPHSABS 0.5 (L) 07/30/2023 1349   LYMPHSABS 0.6 (L) 07/08/2023 1629   MONOABS 0.3 07/11/2023 1349   EOSABS 0.0 07/24/2023 1349   EOSABS 0.0 07/08/2023 1629   BASOSABS 0.0 07/23/2023 1349   BASOSABS 0.0 07/08/2023 1629      Latest Ref Rng & Units 07/07/2023    7:46 AM 07/17/2023    6:46 AM 07/13/2023   11:24 PM  CMP  Glucose 70 - 99 mg/dL 74  68    BUN 8 - 23 mg/dL 76  78    Creatinine 9.51 - 1.00 mg/dL 8.84  1.66    Sodium 063 - 145 mmol/L 135  137    Potassium 3.5 - 5.1 mmol/L 4.2  4.3    Chloride 98 - 111 mmol/L 104  109    CO2 22 - 32 mmol/L 20  16    Calcium 8.9 - 10.3 mg/dL 6.9  7.5    Total Protein 6.5 - 8.1 g/dL  5.5  5.7   Total Bilirubin 0.3 - 1.2 mg/dL  4.9  4.5   Alkaline Phos 38 - 126 U/L  88  104   AST 15 - 41 U/L  23  24   ALT 0 - 44 U/L  13  14       Scheduled Meds:  FLUoxetine  10 mg Oral Daily   nicotine  7 mg Transdermal Daily   pantoprazole  40 mg Oral BID   pregabalin  25 mg Oral BID   thiamine (VITAMIN B1) injection  100 mg Intravenous Q24H   Continuous Infusions:  octreotide (SANDOSTATIN) 500 mcg in sodium chloride 0.9 % 250 mL (2 mcg/mL) infusion 50 mcg/hr (07/10/2023 1133)   sodium bicarbonate 150 mEq in sterile water 1,150 mL infusion 100 mL/hr at 07/06/2023 1340    Time 40  Rhetta Mura, MD  [facility]

## 2023-07-18 NOTE — Progress Notes (Signed)
Dr. Ace Gins notified of Current BP as well as bp hx trend. Ok to send back to floor [facility]

## 2023-07-18 NOTE — Interval H&P Note (Signed)
History and Physical Interval Note:  08/01/2023 11:31 AM  Erika [name]  has presented today for surgery, with the diagnosis of melena, cirrhosis.  The various methods of treatment have been discussed with the patient and family. After consideration of risks, benefits and other options for treatment, the patient has consented to  Procedure(s): ESOPHAGOGASTRODUODENOSCOPY (EGD) WITH PROPOFOL (N/A) as a surgical intervention.  The patient's history has been reviewed, patient examined, no change in status, stable for surgery.  I have reviewed the patient's chart and labs.  Questions were answered to the patient's satisfaction.     Jenel Lucks

## 2023-07-18 NOTE — Progress Notes (Signed)
Notified Dr. Ace Gins anesthesiology of pt BP staying in 70's sytolic, order for Albumin 250  ml. Dr. Ace Gins will come to bedside and eval. Continue to monitor

## 2023-07-18 NOTE — Anesthesia Postprocedure Evaluation (Signed)
Anesthesia Post Note  Patient: Erika [name]  Procedure(s) Performed: ESOPHAGOGASTRODUODENOSCOPY (EGD) WITH PROPOFOL BIOPSY     Patient location during evaluation: PACU Anesthesia Type: MAC Level of consciousness: awake and alert Pain management: pain level controlled Vital Signs Assessment: post-procedure vital signs reviewed and stable Respiratory status: spontaneous breathing, nonlabored ventilation, respiratory function stable and patient connected to nasal cannula oxygen Cardiovascular status: stable and blood pressure returned to baseline Postop Assessment: no apparent nausea or vomiting Anesthetic complications: no   No notable events documented.  Last Vitals:  Vitals:   07/31/2023 1346 07/30/2023 1610  BP: (!) 90/42 (!) 111/47  Pulse: 83 90  Resp: 18   Temp: 37 C (!) 36.4 C  SpO2: 96% 100%    Last Pain:  Vitals:   07/06/2023 1610  TempSrc: Oral  PainSc:                  Mariann Barter

## 2023-07-18 NOTE — Op Note (Signed)
[facility] Patient Name: Chirstine [name] Procedure Date : 07/21/2023 MRN: [id number] Attending MD: Dub Amis. Tomasa Rand , MD, [id number] Date of Birth: 12/10/1955 CSN: [id number] Age: 67 Admit Type: Inpatient Procedure:                Upper GI endoscopy Indications:              Acute post hemorrhagic anemia, Melena in setting of                            frequent NSAID use. Known cirrhotic secondary to                            EtOH, stopped drinking 3 weeks ago. Providers:                Dub Amis. Tomasa Rand, MD, Weston Settle, RN,                            Kandice Robinsons, Technician Referring MD:              Medicines:                Monitored Anesthesia Care Complications:            No immediate complications. Estimated Blood Loss:     Estimated blood loss was minimal. Procedure:                Pre-Anesthesia Assessment:                           - Prior to the procedure, a History and Physical                            was performed, and patient medications and                            allergies were reviewed. The patient's tolerance of                            previous anesthesia was also reviewed. The risks                            and benefits of the procedure and the sedation                            options and risks were discussed with the patient.                            All questions were answered, and informed consent                            was obtained. Prior Anticoagulants: The patient has                            taken no anticoagulant or antiplatelet agents. ASA  Grade Assessment: IV - A patient with severe                            systemic disease that is a constant threat to life.                            After reviewing the risks and benefits, the patient                            was deemed in satisfactory condition to undergo the                            procedure.                            After obtaining informed consent, the endoscope was                            passed under direct vision. Throughout the                            procedure, the patient's blood pressure, pulse, and                            oxygen saturations were monitored continuously. The                            [id number]-[id number] ([id number]) Olympus endoscope was introduced                            through the mouth, and advanced to the second part                            of duodenum. The upper GI endoscopy was                            accomplished without difficulty. The patient                            tolerated the procedure well. Scope In: Scope Out: Findings:      Grade II varices were found in the mid esophagus and in the distal       esophagus. They were large in size.      The exam of the esophagus was otherwise normal.      Many non-bleeding cratered, linear and superficial gastric ulcers with       no stigmata of bleeding were found in the gastric antrum. The largest       lesion was 10 mm in largest dimension. Biopsies were taken with a cold       forceps for Helicobacter pylori testing. Estimated blood loss was       minimal.      Moderate portal hypertensive gastropathy was found in the gastric body.      The exam of the stomach was otherwise normal.      Three non-bleeding superficial duodenal  ulcers with no stigmata of       bleeding were found in the duodenal bulb. The largest lesion was 12 mm       in largest dimension.      The exam of the duodenum was otherwise normal. Impression:               - Grade II esophageal varices. No bleeding stigmata                            present. Not banded as they were not felt to be                            source of bleeding, and patient's platelet count is                            36.                           - Non-bleeding gastric ulcers with no stigmata of                            bleeding. Biopsied.                           -  Portal hypertensive gastropathy.                           - Non-bleeding duodenal ulcers with no stigmata of                            bleeding.                           - Suspect peptic ulcer disease as the source of the                            patient's melena and acute blood loss anemia.                            Suspect NSAIDs as the likely cause of the ulcers. Moderate Sedation:      N/A Recommendation:           - Patient has a contact number available for                            emergencies. The signs and symptoms of potential                            delayed complications were discussed with the                            patient. Return to normal activities tomorrow.                            Written discharge instructions were provided to the  patient.                           - Resume regular diet.                           - Continue present medications.                           - Await pathology results.                           - Recommend starting carvedilol 3.25 mg PO BID at                            some point to reduce risk of variceal bleeding.                            Would not start now in the setting of acute kidney                            injury.                           - Ok to switch PPI to PO. Recommend twice daily PPI                            for 8 weeks, then decrease to once daily.                           - Would continue octreotide for another day given                            presence of varices and acute kidney injury                           - Continue empiric antibiotics for 72 hours total                            then stop.                           - Recommend nephrology consult given lack of                            improvement in SCr despite albumin/volume                            challenge. Consider starting midodrine.                           - Avoid all NSAIDs indefinitely given  renal                            insufficiency and peptic ulcers. Ok to use Tylenol  for joint pain, but recommend using <3 grams per                            day. Procedure Code(s):        --- Professional ---                           [phone], Esophagogastroduodenoscopy, flexible,                            transoral; with biopsy, single or multiple Diagnosis Code(s):        --- Professional ---                           I85.00, Esophageal varices without bleeding                           K25.9, Gastric ulcer, unspecified as acute or                            chronic, without hemorrhage or perforation                           K76.6, Portal hypertension                           K31.89, Other diseases of stomach and duodenum                           K26.9, Duodenal ulcer, unspecified as acute or                            chronic, without hemorrhage or perforation                           D62, Acute posthemorrhagic anemia                           K92.1, Melena (includes Hematochezia) CPT copyright 2022 American [employer] Association. All rights reserved. The codes documented in this report are preliminary and upon coder review may  be revised to meet current compliance requirements. Galen Russman E. Tomasa Rand, MD 07/13/2023 12:15:30 PM This report has been signed electronically. Number of Addenda: 0

## 2023-07-19 ENCOUNTER — Encounter (HOSPITAL_COMMUNITY): Payer: Self-pay | Admitting: Gastroenterology

## 2023-07-19 DIAGNOSIS — D500 Iron deficiency anemia secondary to blood loss (chronic): Secondary | ICD-10-CM | POA: Diagnosis not present

## 2023-07-19 LAB — CBC WITH DIFFERENTIAL/PLATELET
Abs Immature Granulocytes: 0.17 10*3/uL — ABNORMAL HIGH (ref 0.00–0.07)
Basophils Absolute: 0.1 10*3/uL (ref 0.0–0.1)
Basophils Relative: 1 %
Eosinophils Absolute: 0 10*3/uL (ref 0.0–0.5)
Eosinophils Relative: 0 %
HCT: 24.7 % — ABNORMAL LOW (ref 36.0–46.0)
Hemoglobin: 8.4 g/dL — ABNORMAL LOW (ref 12.0–15.0)
Immature Granulocytes: 2 %
Lymphocytes Relative: 7 %
Lymphs Abs: 0.7 10*3/uL (ref 0.7–4.0)
MCH: 30.7 pg (ref 26.0–34.0)
MCHC: 34 g/dL (ref 30.0–36.0)
MCV: 90.1 fL (ref 80.0–100.0)
Monocytes Absolute: 0.5 10*3/uL (ref 0.1–1.0)
Monocytes Relative: 4 %
Neutro Abs: 9 10*3/uL — ABNORMAL HIGH (ref 1.7–7.7)
Neutrophils Relative %: 86 %
Platelets: 34 10*3/uL — ABNORMAL LOW (ref 150–400)
RBC: 2.74 MIL/uL — ABNORMAL LOW (ref 3.87–5.11)
RDW: 15.4 % (ref 11.5–15.5)
WBC: 10.4 10*3/uL (ref 4.0–10.5)
nRBC: 0 % (ref 0.0–0.2)

## 2023-07-19 LAB — COMPREHENSIVE METABOLIC PANEL
ALT: 12 U/L (ref 0–44)
AST: 31 U/L (ref 15–41)
Albumin: 1.6 g/dL — ABNORMAL LOW (ref 3.5–5.0)
Alkaline Phosphatase: 79 U/L (ref 38–126)
Anion gap: 13 (ref 5–15)
BUN: 75 mg/dL — ABNORMAL HIGH (ref 8–23)
CO2: 23 mmol/L (ref 22–32)
Calcium: 6.4 mg/dL — CL (ref 8.9–10.3)
Chloride: 98 mmol/L (ref 98–111)
Creatinine, Ser: 3.2 mg/dL — ABNORMAL HIGH (ref 0.44–1.00)
GFR, Estimated: 15 mL/min — ABNORMAL LOW (ref >60)
Glucose, Bld: 72 mg/dL (ref 70–99)
Potassium: 4.7 mmol/L (ref 3.5–5.1)
Sodium: 134 mmol/L — ABNORMAL LOW (ref 135–145)
Total Bilirubin: 5.1 mg/dL — ABNORMAL HIGH (ref 0.3–1.2)
Total Protein: 5.5 g/dL — ABNORMAL LOW (ref 6.5–8.1)

## 2023-07-19 LAB — MITOCHONDRIAL ANTIBODIES: Mitochondrial M2 Ab, IgG: <20 U (ref 0.0–20.0)

## 2023-07-19 LAB — ANTI-SMOOTH MUSCLE ANTIBODY, IGG: F-Actin IgG: 37 U — ABNORMAL HIGH (ref 0–19)

## 2023-07-19 LAB — MRSA NEXT GEN BY PCR, NASAL: MRSA by PCR Next Gen: NOT DETECTED

## 2023-07-19 MED ORDER — OCTREOTIDE ACETATE 500 MCG/ML IJ SOLN
200.0000 ug | Freq: Two times a day (BID) | INTRAMUSCULAR | Status: DC
  Administered 2023-07-19 – 2023-07-29 (×20): 200 ug via SUBCUTANEOUS
  Filled 2023-07-19 (×23): qty 0.4

## 2023-07-19 MED ORDER — SODIUM CHLORIDE 0.9 % IV SOLN: INTRAVENOUS | Status: DC

## 2023-07-19 MED ORDER — CHLORHEXIDINE GLUCONATE CLOTH 2 % EX PADS
6.0000 | MEDICATED_PAD | Freq: Every day | CUTANEOUS | Status: DC
  Administered 2023-07-19 – 2023-07-29 (×10): 6 via TOPICAL

## 2023-07-19 MED ORDER — ALBUMIN HUMAN 25 % IV SOLN
37.5000 g | Freq: Once | INTRAVENOUS | Status: AC
  Administered 2023-07-19: 12.5 g via INTRAVENOUS
  Filled 2023-07-19: qty 150

## 2023-07-19 MED ORDER — MIDODRINE HCL 5 MG PO TABS
5.0000 mg | ORAL_TABLET | Freq: Three times a day (TID) | ORAL | Status: DC
  Administered 2023-07-19 – 2023-07-20 (×3): 5 mg via ORAL
  Filled 2023-07-19 (×4): qty 1

## 2023-07-19 MED ORDER — ADULT MULTIVITAMIN W/MINERALS CH
1.0000 | ORAL_TABLET | Freq: Every day | ORAL | Status: DC
  Administered 2023-07-19 – 2023-07-21 (×3): 1 via ORAL
  Filled 2023-07-19 (×3): qty 1

## 2023-07-19 MED ORDER — ALBUMIN HUMAN 5 % IV SOLN
50.0000 g | Freq: Once | INTRAVENOUS | Status: AC
  Administered 2023-07-19: 12.5 g via INTRAVENOUS
  Filled 2023-07-19: qty 1000

## 2023-07-19 MED ORDER — ENSURE ENLIVE PO LIQD
237.0000 mL | Freq: Two times a day (BID) | ORAL | Status: DC
  Administered 2023-07-19 – 2023-07-21 (×4): 237 mL via ORAL

## 2023-07-19 NOTE — Progress Notes (Signed)
Physical Therapy Treatment Patient Details Name: AARIANNA NALLEY MRN: [id number] DOB: 12-13-1955 Today's Date: 07/19/2023   History of Present Illness 67 y.o. female presenting 07/13/2023 after ground level fall with head strike. PMH significant for rheumatoid arthritis, cirrhosis secondary to alcohol abuse, bipolar disorder, history of portal hypertension, history of from a cytopenia, GERD, hypertension.    PT Comments  Patient received in bed, she is agreeable to PT session. Reports she does not want to get up to chair. She requires min A for bed mobility and use of bed rails. Has long arm splint on R UE currently. No WB limitations documented. Patient is very bruised along L thigh and whole left leg is swollen. She is able to sit with single UE support on side of bed. Patient is able to stand with min A and RW, took maybe 2 side steps and then returned to sitting. She declines more ambulation or getting over to recliner. Patient demonstrates some fear avoidance behavior during session.  She will continue to benefit from skilled PT to improve safety, strength and functional independence.     If plan is discharge home, recommend the following: A lot of help with walking and/or transfers;A lot of help with bathing/dressing/bathroom;Assistance with cooking/housework;Help with stairs or ramp for entrance   Can travel by private vehicle     No  Equipment Recommendations  Rolling walker (2 wheels);BSC/3in1    Recommendations for Other Services       Precautions / Restrictions Precautions Precautions: Fall Precaution Comments: R UE splint. No weight bearing limitations documented, no fracture. Just swelling. Required Braces or Orthoses: Splint/Cast Splint/Cast - Date Prophylactic Dressing Applied (if applicable): 07/19/23 Restrictions Weight Bearing Restrictions: No     Mobility  Bed Mobility Overal bed mobility: Needs Assistance Bed Mobility: Supine to Sit     Supine to sit: Min  assist     General bed mobility comments: pain limited, min A to get upright seated on edge of bed. Limited ability to use R UE and L LE due to pain/swelling    Transfers Overall transfer level: Needs assistance Equipment used: Rolling walker (2 wheels) Transfers: Sit to/from Stand Sit to Stand: Min assist           General transfer comment: min A for sit to stand from low bed. SHe was able to take 2-3 side steps at edge of bed. Declined moving to recliner or walking.    Ambulation/Gait               General Gait Details: declined   Optometrist     Tilt Bed    Modified Rankin (Stroke Patients Only)       Balance Overall balance assessment: Needs assistance Sitting-balance support: Single extremity supported, Feet unsupported Sitting balance-Leahy Scale: Good Sitting balance - Comments: supervision for sitting edge of bed     Standing balance-Leahy Scale: Fair Standing balance comment: reliant on B UE support in standing                            Cognition Arousal: Alert Behavior During Therapy: WFL for tasks assessed/performed Overall Cognitive Status: No family/caregiver present to determine baseline cognitive functioning                               Problem Solving: Requires  verbal cues          Exercises Other Exercises Other Exercises: encouraged patient to perform LAQ in sitting to promote movement and circulation especially of L LE which is swollen and very bruised    General Comments        Pertinent Vitals/Pain Pain Assessment Pain Assessment: Faces Faces Pain Scale: Hurts even more Pain Location: R elbow, left hip Pain Descriptors / Indicators: Grimacing, Guarding, Discomfort, Sore Pain Intervention(s): Monitored during session, Repositioned    Home Living                          Prior Function            PT Goals (current goals can now be found in the  care plan section) Acute Rehab PT Goals Patient Stated Goal: Less pain with getting up and moving PT Goal Formulation: With patient Time For Goal Achievement: 07/31/23 Potential to Achieve Goals: Good Progress towards PT goals: Progressing toward goals    Frequency    Min 1X/week      PT Plan      Co-evaluation              AM-PAC PT "6 Clicks" Mobility   Outcome Measure  Help needed turning from your back to your side while in a flat bed without using bedrails?: A Little Help needed moving from lying on your back to sitting on the side of a flat bed without using bedrails?: A Lot Help needed moving to and from a bed to a chair (including a wheelchair)?: A Lot Help needed standing up from a chair using your arms (e.g., wheelchair or bedside chair)?: A Lot Help needed to walk in hospital room?: Total Help needed climbing 3-5 steps with a railing? : Total 6 Click Score: 11    End of Session   Activity Tolerance: Patient limited by pain;Other (comment) (fearful) Patient left: in bed;with call bell/phone within reach;with bed alarm set Nurse Communication: Mobility status PT Visit Diagnosis: Unsteadiness on feet (R26.81);Muscle weakness (generalized) (M62.81);Other abnormalities of gait and mobility (R26.89);Difficulty in walking, not elsewhere classified (R26.2);Pain;History of falling (Z91.81) Pain - Right/Left:  (Bilateral) Pain - part of body: Leg;Arm     Time: 1610-9604 PT Time Calculation (min) (ACUTE ONLY): 24 min  Charges:    $Therapeutic Activity: 23-37 mins PT General Charges $$ ACUTE PT VISIT: 1 Visit                     Jozlynn Plaia, PT, GCS 07/19/23,1:36 PM

## 2023-07-19 NOTE — Progress Notes (Addendum)
HOSPITALIST ROUNDING NOTE JULIEANA DEMIRJIAN XBJ:[id number]  DOB: 07/18/1956  DOA: 07/24/2023  PCP: Claiborne Rigg, NP  07/19/2023,10:30 AM   LOS: 3 days      Code Status: Full code   From:  home    Current Dispo:  unclear     67 year old Seropositive CCP erosive rheumatoid arthritis followed by [facility] Rhe[facility] Dr. 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  LOC Imaging CT cervical spine no fracture small left-sided contusion cervical CT negative head CT normal CXR negative CT left hip 34 mm left lateral hematoma with pelvic ascites  US renal increased renal parenchyma on the right medical renal disease normal left kidney, mild ascites Knee x-rays no fracture left hip x-ray no fracture left hand x-ray no fracture right elbow no convincing acute fracture no dislocation posterior soft tissue swelling  Came to ED and hypotensive, hemoglobin trended down from 10-8 found to have electrolyte abnormalities with AKI gap acidosis and platelets of 40 Imaging showed large hematoma--also Hemoccult.positive stool--GI was consulted because of cirrhosis 1 unit of blood, albumin, 1 packet of platelets bolused IV fluids 1 L 9/15 paracentesis attempted --not enough fluid-given vitamin K as well 9/16 EGD = grade 2 varices no overt bleeding, nonbleeding ulcers, portal hypertensive gastropathy,?  PUD--- likely- CT elbow showed no fracture only hematoma Foley catheter placed as retaining urine IV iron given 9/17 nephrology formally  consulted secondary to stalled resolution of creatinine?  Hepatorenal syndrome    Plan  Hypovolemic, distributive shock [early] on admission from 34 cm cubic hematoma Also component of hepatorenal syndrome Albumin platelets PRBC given- Monitor trends of pressures--- she is still little hypotensive--- starting midodrine 5 3 times daily and may need to titrate Will place TED hose additionally--- albumin as below  Acute urinary retention causing AKI s,?  Hepatorenal syndrome/component acute blood loss anemia US renal as above--FENa 0.2% indicative of prerenal Place Foley-will need voiding trial in 24 to 48 hours but may require catheter Stop bicarb gtt. albumin 50 now, octreotide changed to subcu 200 twice daily, stop and 2-3 more days  Alcoholic cirrhosis with complication-pancytopenia-platelets 30's IgM HBc +, all other labs negative, suspect false positive?  --- Repeat labs pending Meldl-NA 29, 3 mo mortality 30.8 Platelets still in 30s watch closely  Acute blood loss anemia secondary to fall with hematoma, possible/questionable component of heme positive stool Status post IV iron x 1 EGD as above but no overt bleed Antibiotics stopped 9/16 including cephalexin  Fall on admission Main issue is left hip and right elbow-right elbow seems to have some swelling No fracture on CT-discussed with Ortho PA-recommends dynamic splint-3-week follow-up with Dr. Aundria Rud or Everardo Pacific in the OP setting [needs to be set up by skilled facility]  Electrolyte abnormalities noted-calcium corrects with albumin-no correction at this time urine charge  Rheumatoid arthritis--has outpatient follow-up scheduled-hold Lyrica for now given AKI  Prediabetic CBGs in the 60s yesterday-stop all hypoglycemic agents only eating 25%-supplement as needed  Depression-start lower dose of antidepressants given risk of encephalopathy in the setting of AKI   Spoke with  family 07/12/2023 [name] [phone]  DVT prophylaxis:  SCD  Status is: Inpatient Remains inpatient appropriate because:   Unstable for discharge    Subjective:  Overall she looks better, no chest pain No fever No blood loss from any orifice Still pain in right elbow   Objective + exam Vitals:   07/28/2023 1610 07/04/2023 1951 07/19/23 0506 07/19/23 0824  BP: (!) 111/47 (!) 97/46 (!) 99/44 (!) 103/46  Pulse: 90 90 92 92  Resp:  16 18   Temp: (!) 97.5 F (36.4 C) 98 F (36.7 C) 98 F (36.7 C) 97.8 F (36.6 C)  TempSrc: Oral Oral  Oral  SpO2: 100% 96% 94% 92%  Weight:      Height:       Filed Weights   07/30/2023 1328  Weight: 65.8 kg    Examination:  Looks better-EXTR Chest clear Hepatojugular reflex is positive Abdomen is distended cannot appreciate shifting dullness Not really any lower extremity edema ROM passively to right elbow is restricted with some tenderness with extension Chest is clear No asterixis  Data Reviewed: reviewed   CBC    Component Value Date/Time   WBC 10.4 07/19/2023 0658   RBC 2.74 (L) 07/19/2023 0658   HGB 8.4 (L) 07/19/2023 0658   HGB 10.7 (L) 07/08/2023 1629   HCT 24.7 (L) 07/19/2023 0658   HCT 31.6 (L) 07/08/2023 1629   PLT 34 (L) 07/19/2023 0658   PLT 71 (LL) 07/08/2023 1629   MCV 90.1 07/19/2023 0658   MCV 95 07/08/2023 1629   MCH 30.7 07/19/2023 0658   MCHC 34.0 07/19/2023 0658   RDW 15.4 07/19/2023 0658   RDW 13.4 07/08/2023 1629   LYMPHSABS PENDING 07/19/2023 0658   LYMPHSABS 0.6 (L) 07/08/2023 1629   MONOABS PENDING 07/19/2023 0658   EOSABS PENDING 07/19/2023 0658   EOSABS 0.0 07/08/2023 1629   BASOSABS PENDING 07/19/2023 0658   BASOSABS 0.0 07/08/2023 1629      Latest Ref Rng & Units 07/19/2023    6:58 AM 07/12/2023    7:46 AM 07/17/2023    6:46 AM  CMP  Glucose 70 - 99 mg/dL 72  74  68   BUN 8 - 23 mg/dL 75  76  78   Creatinine 0.44 - 1.00 mg/dL 2.13  0.86  5.78   Sodium 135 - 145 mmol/L 134  135  137   Potassium 3.5 - 5.1 mmol/L 4.7  4.2  4.3   Chloride 98 -  111 mmol/L 98  104  109   CO2 22 - 32 mmol/L 23  20  16    Calcium 8.9 - 10.3 mg/dL 6.4  6.9  7.5   Total Protein 6.5 - 8.1 g/dL 5.5   5.5   Total Bilirubin 0.3 - 1.2 mg/dL 5.1   4.9   Alkaline Phos 38 - 126 U/L 79   88   AST 15 - 41 U/L 31   23   ALT 0 - 44 U/L 12   13      Scheduled Meds:  Chlorhexidine Gluconate Cloth  6 each Topical Daily   FLUoxetine  10 mg Oral Daily   midodrine  5 mg Oral TID WC   nicotine  7 mg Transdermal Daily   pantoprazole  40 mg Oral BID   pregabalin  25 mg Oral BID   thiamine (VITAMIN B1) injection  100 mg Intravenous Q24H   Continuous Infusions:  sodium chloride     albumin human     octreotide (  SANDOSTATIN) 500 mcg in sodium chloride 0.9 % 250 mL (2 mcg/mL) infusion 50 mcg/hr (07/19/23 1021)    Time 50  Rhetta Mura, MD  [facility]

## 2023-07-19 NOTE — Progress Notes (Signed)
Orthopedic Tech Progress Note Patient Details:  Erika [name] 1956/10/13 [id number]  Ortho Devices Type of Ortho Device: Long arm splint Ortho Device/Splint Location: RUE Ortho Device/Splint Interventions: Ordered, Application, Adjustment   Post Interventions Patient Tolerated: Well Instructions Provided: Care of device, Adjustment of device  Sherilyn Banker 07/19/2023, 11:52 AM

## 2023-07-19 NOTE — Consult Note (Signed)
Nephrology Consult    Assessment/Recommendations:   AKI  -baseline Cr normal -suspecting this is related to HRS. Relatively bland urine sediment, no obstruction on imaging -seems to have been adequately volume resuscitated since she's been admitted. Agree with octreotide. MAPs below goal. Will start midodrine 5mg  tid to start with, can titrate accordingly -no indication for RRT at this junction -Avoid nephrotoxic medications including NSAIDs and iodinated intravenous contrast exposure unless the latter is absolutely indicated.  Preferred narcotic agents for pain control are hydromorphone, fentanyl, and methadone. Morphine should not be used. Avoid Baclofen and avoid oral sodium phosphate and magnesium citrate based laxatives / bowel preps. Continue strict Input and Output monitoring. Will monitor the patient closely with you and intervene or adjust therapy as indicated by changes in clinical status/labs   EtOH Cirrhosis -GI following -HRS plan as above  Urinary retention -currently with foley  ABLA, hematoma Pancytopenia -s/p EGD 9/16, no overt bleeding -transfuse PRN for hgb <7 -per primary  Hypotension -h/o HTN -midodrine as above -if BP is worsening then would recommend reassessing her hematoma to make sure it is not expanding, will defer to primary for this  Metabolic acidosis -resolved -likely secondary to AKI and decreased lactate clearance from cirrhosis  Recommendations conveyed to primary service.    Taelyr Jantz [facility] 07/19/2023 1:39 PM   _____________________________________________________________________________________   History of Present Illness: Erika [name] is a/an 67 y.o. female with a past medical history of cirrhosis secondary to alcohol abuse, portal hypertension, pancytopenia, GERD, hypertension rheumatoid arthritis who presents to [facility] after a fall.  Fall was last week which resulted in swelling of the left hip which  worsened.  In the ER, I have AKI hypertension.  Imaging revealed a large hematoma.  Her Hemoccult was also positive given worsening anemia. Patient seen and examined. She also reports that she was taking 2 Aleves a day after the fall. She reports that she feels a little more swollen now with all the fluids she has received. No other complaints.    Medications:  Current Facility-Administered Medications  Medication Dose Route Frequency Provider Last Rate Last Admin   0.9 %  sodium chloride infusion   Intravenous Continuous Rhetta Mura, MD 75 mL/hr at 07/19/23 1157 New Bag at 07/19/23 1157   Chlorhexidine Gluconate Cloth 2 % PADS 6 each  6 each Topical Daily Rhetta Mura, MD   6 each at 07/19/23 0858   FLUoxetine (PROZAC) capsule 10 mg  10 mg Oral Daily Rhetta Mura, MD   10 mg at 07/19/23 0856   midodrine (PROAMATINE) tablet 5 mg  5 mg Oral TID WC Rhetta Mura, MD   5 mg at 07/19/23 1157   nicotine (NICODERM CQ - dosed in mg/24 hr) patch 7 mg  7 mg Transdermal Daily Doutova, Anastassia, MD   7 mg at 07/19/23 0857   octreotide (SANDOSTATIN) 500 mcg in sodium chloride 0.9 % 250 mL (2 mcg/mL) infusion  50 mcg/hr Intravenous Continuous Hammons, Kimberly B, RPH 25 mL/hr at 07/19/23 1021 50 mcg/hr at 07/19/23 1021   pantoprazole (PROTONIX) EC tablet 40 mg  40 mg Oral BID Jenel Lucks, MD   40 mg at 07/19/23 0856   pregabalin (LYRICA) capsule 25 mg  25 mg Oral BID Rhetta Mura, MD   25 mg at 07/19/23 0856   thiamine (VITAMIN B1) injection 100 mg  100 mg Intravenous Q24H Therisa Doyne, MD   100 mg at 07/21/2023 2254     ALLERGIES Patient has  no known allergies.  MEDICAL HISTORY Past Medical History:  Diagnosis Date   Arthritis    Cirrhosis (HCC)    Depression    GERD (gastroesophageal reflux disease)    History of shingles 10/18/2018   Hypertension    Rheumatoid arthritis(714.0)      SOCIAL HISTORY Social History   Socioeconomic History    Marital status: Divorced    Spouse name: Not on file   Number of children: Not on file   Years of education: Not on file   Highest education level: Not on file  Occupational History   Not on file  Tobacco Use   Smoking status: Every Day    Current packs/day: 0.50    Types: Cigarettes   Smokeless tobacco: Current  Vaping Use   Vaping status: Never Used  Substance and Sexual Activity   Alcohol use: Not Currently    Alcohol/week: 5.0 standard drinks of alcohol    Types: 5 Cans of beer per week    Comment: sometimes - none in a week   Drug use: Yes    Types: Marijuana    Comment: last 04/05/22   Sexual activity: Not on file  Other Topics Concern   Not on file  Social History Narrative   Not on file   Social Determinants of Health   Financial Resource Strain: Not on File (07/23/2022)   Received from [employer] [employer]    Financial Resource Strain: 0  Food Insecurity: No Food Insecurity (07/10/2023)   Hunger Vital Sign    Worried About Running Out of Food in the Last Year: Never true    Ran Out of Food in the Last Year: Never true  Transportation Needs: No Transportation Needs (07/25/2023)   PRAPARE - Administrator, Civil Service (Medical): No    Lack of Transportation (Non-Medical): No  Physical Activity: Not on File (07/23/2022)   Received from [place], [place]   Physical Activity    Physical Activity: 0  Stress: Not on File (07/23/2022)   Received from [facility], [place]   Stress    Stress: 0  Social Connections: Not on File (07/10/2023)   Received from [facility]   Social Connections    Connectedness: 0  Intimate Partner Violence: Not At Risk (07/19/2023)   Humiliation, Afraid, Rape, and Kick questionnaire    Fear of Current or Ex-Partner: No    Emotionally Abused: No    Physically Abused: No    Sexually Abused: No     FAMILY HISTORY Family History  Problem Relation Age of Onset   Psoriasis Sister    Breast cancer Neg Hx     Family history  positive for kidney disease in sister. Cause unknown, unfortunately deceased now.  Review of Systems: 12 systems reviewed Otherwise as per HPI, all other systems reviewed and negative  Physical Exam: Vitals:   07/19/23 0824 07/19/23 1117  BP: (!) 103/46 (!) 103/43  Pulse: 92 96  Resp:    Temp: 97.8 F (36.6 C) 98.7 F (37.1 C)  SpO2: 92% 93%   Total I/O In: 828.9 [I.V.:828.9] Out: -   Intake/Output Summary (Last 24 hours) at 07/19/2023 1339 Last data filed at 07/19/2023 1021 Gross per 24 hour  Intake 4892.56 ml  Output --  Net 4892.56 ml   General: well-appearing, no acute distress HEENT: anicteric sclera, oropharynx clear without lesions CV: regular rate, normal rhythm, no murmurs, no gallops, no rubs Lungs: clear to auscultation bilaterally, normal work of breathing  Abd: soft, non-tender, distended Skin: no visible lesions or rashes Psych: alert, engaged, appropriate mood and affect Musculoskeletal: 1+ pitting edema (left>rt) Neuro: normal speech, no gross focal deficits   Test Results Reviewed Lab Results  Component Value Date   NA 134 (L) 07/19/2023   K 4.7 07/19/2023   CL 98 07/19/2023   CO2 23 07/19/2023   BUN 75 (H) 07/19/2023   CREATININE 3.20 (H) 07/19/2023   CALCIUM 6.4 (LL) 07/19/2023   ALBUMIN 1.6 (L) 07/19/2023   PHOS 7.6 (H) 07/17/2023     I have reviewed all relevant outside healthcare records related to the patient's kidney injury.

## 2023-07-19 NOTE — Progress Notes (Signed)
    Durable Medical Equipment  (From admission, onward)           Start     Ordered   07/19/23 1333  For home use only DME Walker rolling  Once       Question Answer Comment  Walker: With 5 Inch Wheels   Patient needs a walker to treat with the following condition Weakness      07/19/23 1332   07/19/23 1333  For home use only DME Bedside commode  Once       Question:  Patient needs a bedside commode to treat with the following condition  Answer:  Weakness   07/19/23 1332               The patient is confined to one level of the home environment and there is no toilet on the that level of the home.

## 2023-07-19 NOTE — Care Management Important Message (Signed)
Important Message  Patient Details  Name: KAPREE MIDDLESWORTH MRN: [id number] Date of Birth: [birth year]   Medicare Important Message Given:  Yes     Dorena Bodo 07/19/2023, 2:53 PM

## 2023-07-19 NOTE — TOC Initial Note (Signed)
Transition of Care [facility]) - Initial/Assessment Note    Patient Details  Name: Erika [name] MRN: [id number] Date of Birth: 1955-12-02  Transition of Care [facility]) CM/SW Contact:    Erin Sons, LCSW Phone Number: 07/19/2023, 2:00 PM  Clinical Narrative:                  CSW met with pt to discuss SNF rec. Pt becomes tearful when discuss SNF. She explains she wants to return home with her son and her dogs. Her son is available at times but does work. Pt state her mobility has improved since last PT session and that she is able to stand up and walk with a walker.  CSW called pt son and included him on speaker phone. He does not feel she needs SNF level of care and states it is up to her. He believes pt would be able to manage at home when he is at work.   Pt opts for plan to go home with [facility]. She does mention she would need a walker. RNCM notified.   Expected Discharge Plan: Home w Home Health Services Barriers to Discharge: Continued Medical Work up   Patient Goals and CMS Choice   CMS Medicare.gov Compare Post Acute Care list provided to:: Patient Choice offered to / list presented to : Patient      Expected Discharge Plan and Services   Discharge Planning Services: CM Consult Post Acute Care Choice: Home Health Living arrangements for the past 2 months: Single Family Home                 DME Arranged: Bedside commode, Walker rolling         HH Arranged: PT, OT, Nurse's Aide HH Agency: [facility] Date [facility] Agency Contacted: 07/19/23   Representative spoke with at [facility] Agency: Kandee Keen  Prior Living Arrangements/Services Living arrangements for the past 2 months: Single Family Home Lives with:: Adult Children Patient language and need for interpreter reviewed:: Yes Do you feel safe going back to the place where you live?: Yes        Care giver support system in place?: Yes (comment)   Criminal Activity/Legal Involvement Pertinent to Current  Situation/Hospitalization: No - Comment as needed  Activities of Daily Living Home Assistive Devices/Equipment: Cane (specify quad or straight) (straight) ADL Screening (condition at time of admission) Patient's cognitive ability adequate to safely complete daily activities?: Yes Is the patient deaf or have difficulty hearing?: No Does the patient have difficulty seeing, even when wearing glasses/contacts?: No Does the patient have difficulty concentrating, remembering, or making decisions?: Yes Patient able to express need for assistance with ADLs?: Yes Does the patient have difficulty dressing or bathing?: No Independently performs ADLs?: Yes (appropriate for developmental age) Does the patient have difficulty walking or climbing stairs?: Yes Weakness of Legs: Left Weakness of Arms/Hands: Left  Permission Sought/Granted                  Emotional Assessment Appearance:: Appears stated age Attitude/Demeanor/Rapport: Engaged Affect (typically observed): Accepting Orientation: : Oriented to Self, Oriented to Place, Oriented to  Time, Oriented to Situation   Psych Involvement: No (comment)  Admission diagnosis:  Blood loss anemia [D50.0] Patient Active Problem List   Diagnosis Date Noted   Gastric ulcer with hemorrhage 2023-07-31   Duodenal ulcer disease 07-31-2023   AKI (acute kidney injury) (HCC) 07/31/23   Secondary esophageal varices without bleeding (HCC) 07-31-23   Alcoholic cirrhosis of  liver with ascites (HCC) 08/01/2023   Blood loss anemia 12-Aug-2023   Prolonged QT interval 08/12/23   Cardiomegaly 08/12/23   Occult blood positive stool 08-12-23   Acute encephalopathy 12-Aug-2023   Bipolar 2 disorder (HCC) 05/11/2023   Psychophysiological insomnia 05/11/2023   Alcohol use    Anxiety    S/P foot surgery, left    Osteomyelitis (HCC) 07/10/2021   Pancytopenia (HCC) 07/10/2021   Hepatic steatosis 07/10/2021   Osteomyelitis of fifth toe of right foot  (HCC) 04/23/2021   Alcohol use disorder 04/23/2021   Hepatomegaly 04/23/2021   Thrombocytopenia (HCC) 04/23/2021   GERD (gastroesophageal reflux disease) 04/23/2021   Tobacco abuse 04/23/2021   History of shingles 10/18/2018   Rheumatoid arthritis (HCC) 10/18/2018   Rash and nonspecific skin eruption 10/18/2018   Numbness and tingling 10/18/2018   Depression 10/18/2018   PCP:  Claiborne Rigg, NP Pharmacy:   [facility] [phone] Gin[place] Otto, [place] - [address] [place] [address] Phone: [phone] Fax: [phone]  [facility] [address] [place] [address] Phone: [phone] Fax: [phone]     Social Determinants of Health (SDOH) Social History: SDOH Screenings   Food Insecurity: No Food Insecurity (07/22/2023)  Housing: Low Risk  (07/08/2023)  Transportation Needs: No Transportation Needs (07/30/2023)  Utilities: Not At Risk (07/10/2023)  Depression (PHQ2-9): High Risk (07/08/2023)  Financial Resource Strain: Not on File (07/23/2022)   Received from [place], [place]  Physical Activity: Not on File (07/23/2022)   Received from [place], [place]  Social Connections: Not on File (07/10/2023)   Received from [facility]  Stress: Not on File (07/23/2022)   Received from [place], [place]  Tobacco Use: High Risk (07/19/2023)   SDOH Interventions:     Readmission Risk Interventions     No data to display

## 2023-07-19 NOTE — TOC Progression Note (Signed)
Transition of Care [facility]) - Progression Note    Patient Details  Name: Erika [name] MRN: [id number] Date of Birth: [birth year]  Transition of Care [facility]) CM/SW Contact  Kermit Balo, RN Phone Number: 07/19/2023, 1:42 PM  Clinical Narrative:    Per SW pt has declined SNF rehab. They also spoke to son and he is in agreement. CM met with the patient and she is agreeable to home therapies at d/c. She has no preference on the agency. CM has arranged home health with [facility]. Information on the AVS. Frances Furbish will contact her for the first home visit. Walker and BSC ordered and will be delivered to the room prior to d/c home.  Pt states her son provides all needed transportation. Pt manages her own medications and denies any issues.  TOC following.   Expected Discharge Plan: Home w Home Health Services Barriers to Discharge: Continued Medical Work up  Expected Discharge Plan and Services   Discharge Planning Services: CM Consult Post Acute Care Choice: Home Health Living arrangements for the past 2 months: Single Family Home                 DME Arranged: Bedside commode, Walker rolling         HH Arranged: PT, OT, Nurse's Aide HH Agency: [facility] Date [facility] Agency Contacted: 07/19/23   Representative spoke with at [facility] Agency: Kandee Keen   Social Determinants of Health (SDOH) Interventions SDOH Screenings   Food Insecurity: No Food Insecurity (07/30/2023)  Housing: Low Risk  (07/22/2023)  Transportation Needs: No Transportation Needs (08/01/2023)  Utilities: Not At Risk (07/14/2023)  Depression (PHQ2-9): High Risk (07/08/2023)  Financial Resource Strain: Not on File (07/23/2022)   Received from [place], [place]  Physical Activity: Not on File (07/23/2022)   Received from [place], [place]  Social Connections: Not on File (07/10/2023)   Received from [facility]  Stress: Not on File (07/23/2022)   Received from [place], [place]  Tobacco Use: High Risk (07/19/2023)    Readmission Risk  Interventions     No data to display

## 2023-07-19 NOTE — Progress Notes (Signed)
Patient ID: Erika [name], female   DOB: Nov 10, 1955, 67 y.o.   MRN: [id number]    Progress Note   Subjective   Day # 4 CC: Anemia, melena, cirrhosis felt secondary to EtOH  EGD yesterday-grade 2 varices in the mid esophagus and distal esophagus, many nonbleeding cratered linear superficial gastric ulcers with no stigmata of bleeding in the antrum largest 10 mm, biopsies done Moderate portal hypertensive gastropathy and 3 nonbleeding superficial duodenal ulcers with no stigmata of bleeding  Biopsy pending  Ultrasound 07/17/2023 trace ascites insufficient for paracentesis  Drug screen positive for THC and cocaine LAbs today-WBC 10.4/hemoglobin 8.4/Mehta crit 24.7 stable was 34 BUN 75/creatinine 3.20/calcium 6.4 T. bili 5.1/LFTs normal  Patient has no complaints of abdominal discomfort today, no melena or hematochezia denies any nausea    Objective   Vital signs in last 24 hours: Temp:  [97.5 F (36.4 C)-98.7 F (37.1 C)] 98.7 F (37.1 C) (09/17 1117) Pulse Rate:  [83-96] 96 (09/17 1117) Resp:  [16-18] 18 (09/17 0506) BP: (90-111)/(42-47) 103/43 (09/17 1117) SpO2:  [92 %-100 %] 93 % (09/17 1117) Last BM Date : 07/23/2023 General: Older white female in NAD Heart:  Regular rate and rhythm; no murmurs Lungs: Respirations even and unlabored, lungs CTA bilaterally Abdomen:  Soft, nontender and nondistended. Normal bowel sounds. Extremities:  Without edema. Neurologic:  Alert and oriented,  grossly normal neurologically. Psych:  Cooperative. Normal mood and affect.  Intake/Output from previous day: 09/16 0701 - 09/17 0700 In: 4163.7 [I.V.:3849.5; IV Piggyback:314.2] Out: -  Intake/Output this shift: Total I/O In: 828.9 [I.V.:828.9] Out: -   Lab Results: Recent Labs    07/17/23 1905 07/13/2023 0746 07/19/23 0658  WBC 7.3 7.7 10.4  HGB 9.1* 8.6* 8.4*  HCT 27.1* 25.1* 24.7*  PLT 36* 36* 34*   BMET Recent Labs    07/17/23 0646 07/28/2023 0746 07/19/23 0658  NA 137  135 134*  K 4.3 4.2 4.7  CL 109 104 98  CO2 16* 20* 23  GLUCOSE 68* 74 72  BUN 78* 76* 75*  CREATININE 3.24* 3.01* 3.20*  CALCIUM 7.5* 6.9* 6.4*   LFT Recent Labs    08-07-23 2324 07/17/23 0646 07/19/23 0658  PROT 5.7*   < > 5.5*  ALBUMIN 1.5*   < > 1.6*  AST 24   < > 31  ALT 14   < > 12  ALKPHOS 104   < > 79  BILITOT 4.5*   < > 5.1*  BILIDIR 2.6*  --   --   IBILI 1.9*  --   --    < > = values in this interval not displayed.   PT/INR Recent Labs    07/17/23 0646 07/25/2023 0746  LABPROT 19.3* 20.3*  INR 1.6* 1.7*    Studies/Results: CT ELBOW RIGHT WO CONTRAST  Result Date: 07/25/2023 CLINICAL DATA:  Bone mass or bone pain, elbow. Benign features on x-ray. Right elbow redness and swelling extending up superiorly. Open wound over lateral side of the joint. Fell 10 days ago. EXAM: CT OF THE UPPER RIGHT EXTREMITY WITHOUT CONTRAST TECHNIQUE: Multidetector CT imaging of the upper right extremity was performed according to the standard protocol. RADIATION DOSE REDUCTION: This exam was performed according to the departmental dose-optimization program which includes automated exposure control, adjustment of the mA and/or kV according to patient size and/or use of iterative reconstruction technique. COMPARISON:  Right elbow radiographs 08/07/2023, right elbow and forearm radiographs 11/02/2017 FINDINGS: Bones/Joint/Cartilage On remote 11/02/2017 radiographs, there is  mild cortical step-off and irregularity of the lateral aspect of the radial head and radial head-neck junction that is again seen on recent 07/27/2023 radiographs, an old healed fracture. This cortical irregularity and callus formation predominantly within the lateral aspect of the radial head is again seen on the current CT. No definite acute fracture line is seen. Moderate radiocapitellar joint space narrowing, subchondral sclerosis, peripheral osteophytosis. Moderate to severe trochlear-olecranon and coronoid joint space  narrowing with subchondral sclerosis. Multiple degenerative subchondral cysts are seen within the trochlea, coronoid, and olecranon. No acute fracture is identified. No destructive bone lesion is seen. Ligaments Suboptimally assessed by CT. Muscles and Tendons There is mildly heterogeneous material measuring approximately 32 Hounsfield units within the posterior aspect of the triceps that is favored to represent an intramuscular hematoma, measuring up to approximately 4.0 x 2.0 x 8.9 cm (transverse by AP by craniocaudal). No gross tendon tear is seen. Soft tissues There is moderate subcutaneous fat edema and swelling throughout the posterior upper arm, greatest just above the elbow. IMPRESSION: 1. Old healed fracture of the lateral aspect of the radial head and radial head-neck junction. No definite acute fracture line is seen. 2. Likely posttraumatic hematoma within the triceps musculature. 3. 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 Electronically Signed   By: Neita Garnet M.D.   On: 07/23/2023 20:37   ECHOCARDIOGRAM COMPLETE  Result Date: 07/17/2023    ECHOCARDIOGRAM REPORT   Patient Name:   Erika [name] Zender Date of Exam: 07/17/2023 Medical Rec #:  [id number]          Height:       61.0 in Accession #:    [id number]         Weight:       145.0 lb Date of Birth:  1955/12/20           BSA:          1.647 m Patient Age:    67 years           BP:           102/45 mmHg Patient Gender: F                  HR:           77 bpm. Exam Location:  Inpatient Procedure: 2D Echo, Color Doppler, Cardiac Doppler and Intracardiac            Opacification Agent Indications:    Cardiomegaly  History:        Patient has no prior history of Echocardiogram examinations.                 Cardiomegaly; Risk Factors:Current Smoker and ETOH.  Sonographer:    Milbert Coulter Referring Phys: [id number] ANASTASSIA DOUTOVA IMPRESSIONS  1. Left ventricular ejection fraction, by estimation, is 60  to 65%. The left ventricle has normal function. The left ventricle has no regional wall motion abnormalities. Left ventricular diastolic parameters are consistent with Grade II diastolic dysfunction (pseudonormalization).  2. Right ventricular systolic function is normal. The right ventricular size is normal.  3. Left atrial size was moderately dilated.  4. The mitral valve is normal in structure. Trivial mitral valve regurgitation. No evidence of mitral stenosis.  5. Tricuspid valve regurgitation is mild to moderate.  6. The aortic valve is tricuspid. There is mild calcification of the aortic valve. Aortic valve regurgitation is not visualized. Aortic valve sclerosis is present, with no evidence of aortic valve  stenosis.  7. The inferior vena cava is normal in size with greater than 50% respiratory variability, suggesting right atrial pressure of 3 mmHg. FINDINGS  Left Ventricle: Left ventricular ejection fraction, by estimation, is 60 to 65%. The left ventricle has normal function. The left ventricle has no regional wall motion abnormalities. Definity contrast agent was given IV to delineate the left ventricular  endocardial borders. The left ventricular internal cavity size was normal in size. There is no left ventricular hypertrophy. Left ventricular diastolic parameters are consistent with Grade II diastolic dysfunction (pseudonormalization). Right Ventricle: The right ventricular size is normal. No increase in right ventricular wall thickness. Right ventricular systolic function is normal. Left Atrium: Left atrial size was moderately dilated. Right Atrium: Right atrial size was normal in size. Pericardium: There is no evidence of pericardial effusion. Mitral Valve: The mitral valve is normal in structure. Mild mitral annular calcification. Trivial mitral valve regurgitation. No evidence of mitral valve stenosis. Tricuspid Valve: The tricuspid valve is normal in structure. Tricuspid valve regurgitation is mild  to moderate. No evidence of tricuspid stenosis. Aortic Valve: The aortic valve is tricuspid. There is mild calcification of the aortic valve. Aortic valve regurgitation is not visualized. Aortic valve sclerosis is present, with no evidence of aortic valve stenosis. Aortic valve mean gradient measures 9.0 mmHg. Aortic valve peak gradient measures 16.3 mmHg. Aortic valve area, by VTI measures 1.93 cm. Pulmonic Valve: The pulmonic valve was normal in structure. Pulmonic valve regurgitation is not visualized. No evidence of pulmonic stenosis. Aorta: The aortic root is normal in size and structure. Venous: The inferior vena cava is normal in size with greater than 50% respiratory variability, suggesting right atrial pressure of 3 mmHg. IAS/Shunts: No atrial level shunt detected by color flow Doppler.  LEFT VENTRICLE PLAX 2D LVIDd:         5.10 cm   Diastology LVIDs:         2.90 cm   LV e' medial:    8.70 cm/s LV PW:         1.10 cm   LV E/e' medial:  13.8 LV IVS:        1.00 cm   LV e' lateral:   8.92 cm/s LVOT diam:     1.80 cm   LV E/e' lateral: 13.5 LV SV:         86 LV SV Index:   52 LVOT Area:     2.54 cm  RIGHT VENTRICLE RV Basal diam:  3.60 cm RV Mid diam:    3.00 cm RV S prime:     15.90 cm/s TAPSE (M-mode): 2.1 cm LEFT ATRIUM           Index        RIGHT ATRIUM           Index LA diam:      3.30 cm 2.00 cm/m   RA Area:     19.10 cm LA Vol (A4C): 92.4 ml 56.09 ml/m  RA Volume:   51.50 ml  31.26 ml/m  AORTIC VALVE AV Area (Vmax):    1.97 cm AV Area (Vmean):   1.95 cm AV Area (VTI):     1.93 cm AV Vmax:           202.00 cm/s AV Vmean:          138.000 cm/s AV VTI:            0.444 m AV Peak Grad:      16.3  mmHg AV Mean Grad:      9.0 mmHg LVOT Vmax:         156.00 cm/s LVOT Vmean:        106.000 cm/s LVOT VTI:          0.337 m LVOT/AV VTI ratio: 0.76  AORTA Ao Root diam: 3.00 cm Ao Asc diam:  3.00 cm MITRAL VALVE                TRICUSPID VALVE MV Area (PHT): 4.86 cm     TR Peak grad:   35.5 mmHg MV Decel  Time: 156 msec     TR Vmax:        298.00 cm/s MV E velocity: 120.00 cm/s MV A velocity: 87.70 cm/s   SHUNTS MV E/A ratio:  1.37         Systemic VTI:  0.34 m                             Systemic Diam: 1.80 cm Mihai Croitoru MD Electronically signed by Thurmon Fair MD Signature Date/Time: 07/17/2023/3:37:15 PM    Final        Assessment / Plan:    #67 67 year old white female presenting to the emergency room after a fall at home 1 week previous with left hip pain and swelling, found to have hemoglobin of 8.2 heme positive and dark stool  This is in the setting of previous diagnosis of cirrhosis felt secondary to EtOH Picture currently consistent with decompensated cirrhosis-M ELD NA equals 29  EGD yesterday as above with finding of grade 2 esophageal varices and portal gastropathy, no active bleeding, several small shallow gastric ulcers and 3 superficial duodenal ulcers.  Biopsies pending  Patient has been stable, no active bleeding  #2 acute kidney injury-question early hepatorenal, she is currently now on midodrine, and albumin we will leave on octreotide and defer that to nephrology #3 polysubstance abuse #4 large hematoma hip  Plan; 2 g sodium diet Does not need octreotide continued from a GI bleed perspective but will defer to nephrology Continue twice daily PPI will need for at least 8 weeks, then daily long-term Follow-up biopsy Discontinue all EtOH use Daily she would need to a nonselective beta-blocker for portal hypertension however not appropriate currently with acute kidney injury.  She will need outpatient GI follow-up/Dr. Lavon Paganini      Principal Problem:   Blood loss anemia Active Problems:   Rheumatoid arthritis (HCC)   Alcohol use disorder   Thrombocytopenia (HCC)   GERD (gastroesophageal reflux disease)   Tobacco abuse   Bipolar 2 disorder (HCC)   Prolonged QT interval   Cardiomegaly   Occult blood positive stool   Acute encephalopathy   Gastric ulcer  with hemorrhage   Duodenal ulcer disease   AKI (acute kidney injury) (HCC)   Secondary esophageal varices without bleeding (HCC)   Alcoholic cirrhosis of liver with ascites (HCC)     LOS: 3 days   Tycho Cheramie PA-C 07/19/2023, 1:38 PM

## 2023-07-19 NOTE — Progress Notes (Signed)
Critical Ca+ 6.4 MD notified, awaiting orders

## 2023-07-19 NOTE — Plan of Care (Signed)
Problem: Education: Goal: Knowledge of General Education information will improve Description: Including pain rating scale, medication(s)/side effects and non-pharmacologic comfort measures Outcome: Progressing   Problem: Nutrition: Goal: Adequate nutrition will be maintained Outcome: Progressing   Problem: Elimination: Goal: Will not experience complications related to urinary retention Outcome: Progressing

## 2023-07-20 ENCOUNTER — Inpatient Hospital Stay (HOSPITAL_COMMUNITY): Payer: 59

## 2023-07-20 DIAGNOSIS — D500 Iron deficiency anemia secondary to blood loss (chronic): Secondary | ICD-10-CM | POA: Diagnosis not present

## 2023-07-20 LAB — RENAL FUNCTION PANEL
Albumin: 2.2 g/dL — ABNORMAL LOW (ref 3.5–5.0)
Anion gap: 17 — ABNORMAL HIGH (ref 5–15)
BUN: 83 mg/dL — ABNORMAL HIGH (ref 8–23)
CO2: 20 mmol/L — ABNORMAL LOW (ref 22–32)
Calcium: 7.2 mg/dL — ABNORMAL LOW (ref 8.9–10.3)
Chloride: 99 mmol/L (ref 98–111)
Creatinine, Ser: 3.21 mg/dL — ABNORMAL HIGH (ref 0.44–1.00)
GFR, Estimated: 15 mL/min — ABNORMAL LOW (ref >60)
Glucose, Bld: 122 mg/dL — ABNORMAL HIGH (ref 70–99)
Phosphorus: 8.1 mg/dL — ABNORMAL HIGH (ref 2.5–4.6)
Potassium: 4.7 mmol/L (ref 3.5–5.1)
Sodium: 136 mmol/L (ref 135–145)

## 2023-07-20 LAB — BPAM PLATELET PHERESIS
Blood Product Expiration Date: 202409172359
ISSUE DATE / TIME: 202409150250
Unit Type and Rh: 7300

## 2023-07-20 LAB — BLOOD GAS, VENOUS
Acid-base deficit: 3.5 mmol/L — ABNORMAL HIGH (ref 0.0–2.0)
Bicarbonate: 22.1 mmol/L (ref 20.0–28.0)
O2 Saturation: 78.8 %
Patient temperature: 37
pCO2, Ven: 41 mmHg — ABNORMAL LOW (ref 44–60)
pH, Ven: 7.34 (ref 7.25–7.43)
pO2, Ven: 47 mmHg — ABNORMAL HIGH (ref 32–45)

## 2023-07-20 LAB — CBC
HCT: 25.9 % — ABNORMAL LOW (ref 36.0–46.0)
Hemoglobin: 8.7 g/dL — ABNORMAL LOW (ref 12.0–15.0)
MCH: 31.5 pg (ref 26.0–34.0)
MCHC: 33.6 g/dL (ref 30.0–36.0)
MCV: 93.8 fL (ref 80.0–100.0)
Platelets: 33 10*3/uL — ABNORMAL LOW (ref 150–400)
RBC: 2.76 MIL/uL — ABNORMAL LOW (ref 3.87–5.11)
RDW: 15.6 % — ABNORMAL HIGH (ref 11.5–15.5)
WBC: 12.8 10*3/uL — ABNORMAL HIGH (ref 4.0–10.5)
nRBC: 0 % (ref 0.0–0.2)

## 2023-07-20 LAB — PREPARE PLATELET PHERESIS: Unit division: 0

## 2023-07-20 LAB — BRAIN NATRIURETIC PEPTIDE: B Natriuretic Peptide: 1376.4 pg/mL — ABNORMAL HIGH (ref 0.0–100.0)

## 2023-07-20 LAB — AMMONIA: Ammonia: 84 umol/L — ABNORMAL HIGH (ref 9–35)

## 2023-07-20 LAB — SURGICAL PATHOLOGY

## 2023-07-20 MED ORDER — HYDROXYZINE HCL 25 MG PO TABS
25.0000 mg | ORAL_TABLET | Freq: Three times a day (TID) | ORAL | Status: DC | PRN
  Administered 2023-07-20 – 2023-07-21 (×2): 25 mg via ORAL
  Filled 2023-07-20 (×2): qty 1

## 2023-07-20 MED ORDER — MIDODRINE HCL 5 MG PO TABS
10.0000 mg | ORAL_TABLET | Freq: Three times a day (TID) | ORAL | Status: DC
  Administered 2023-07-20 – 2023-07-21 (×3): 10 mg via ORAL
  Filled 2023-07-20 (×4): qty 2

## 2023-07-20 MED ORDER — FUROSEMIDE 10 MG/ML IJ SOLN
60.0000 mg | Freq: Once | INTRAMUSCULAR | Status: AC
  Administered 2023-07-20: 60 mg via INTRAVENOUS
  Filled 2023-07-20: qty 8

## 2023-07-20 MED ORDER — LACTULOSE 10 GM/15ML PO SOLN: 20.0000 g | Freq: Every day | ORAL | Status: DC

## 2023-07-20 MED ORDER — LACTULOSE 10 GM/15ML PO SOLN
20.0000 g | Freq: Two times a day (BID) | ORAL | Status: DC
  Administered 2023-07-20 – 2023-07-21 (×2): 20 g via ORAL
  Filled 2023-07-20 (×2): qty 30

## 2023-07-20 NOTE — Progress Notes (Signed)
CXR ordered by MD.

## 2023-07-20 NOTE — Plan of Care (Signed)

## 2023-07-20 NOTE — Progress Notes (Addendum)
Patient having increased confusion; more noticeable when pt self-removes oxygen. Patient noncompliant with nasal cannula. MD Lowell Guitar paged. VBG ordered.   Based off of UDA, pt could also be experience W/D symptoms. MD aware.

## 2023-07-20 NOTE — Progress Notes (Signed)
[facility] Progress Note    Assessment/ Plan:   AKI  -baseline Cr normal -suspecting this is related to HRS, cocaine (this is questionable). Relatively bland urine sediment, no obstruction on imaging. Has been adequately volume resuscitated since she's been admitted (now hypervolemic) -Cr stable at 3.2 -increase midodrine to 10mg  tid, c/w octreotide -no indication for RRT at this junction -Avoid nephrotoxic medications including NSAIDs and iodinated intravenous contrast exposure unless the latter is absolutely indicated.  Preferred narcotic agents for pain control are hydromorphone, fentanyl, and methadone. Morphine should not be used. Avoid Baclofen and avoid oral sodium phosphate and magnesium citrate based laxatives / bowel preps. Continue strict Input and Output monitoring. Will monitor the patient closely with you and intervene or adjust therapy as indicated by changes in clinical status/labs    EtOH Cirrhosis -GI following -HRS plan as above  Pulmonary edema, acute on chronic diastolic CHF exacerbation -lasix 60mg  IV x 1 dose today, reassess thereafter   Urinary retention -currently with foley   ABLA, hematoma Pancytopenia -s/p EGD 13-Aug-2023, no overt bleeding -transfuse PRN for hgb <7 -per primary   Hypotension H/o HTN -midodrine as above -if BP is worsening then would recommend reassessing her hematoma to make sure it is not expanding, will defer to primary for this   Metabolic acidosis -stable -likely secondary to AKI and decreased lactate clearance from cirrhosis  Cocaine positive -will defer to primary service  Discussed with primary service.  [name], MD [facility]  Subjective:   Patient seen and examined bedside. Was SOB earlier today. CXR revealed new moderate pulm edema. Patient reports that she feels somewhat better right now. Inquired about cocaine use, she reports that she used to use cocaine but not lately so she was  quite taken aback when I told her about her rapid urine drug screen from 2 days ago which was positive for [facility] and cocaine.   Objective:   BP 126/88 (BP Location: Left Arm)   Pulse 74   Temp 98 F (36.7 C)   Resp 16   Ht 5\' 1"  (1.549 m)   Wt 65.8 kg   SpO2 95%   BMI 27.40 kg/m   Intake/Output Summary (Last 24 hours) at 07/20/2023 1335 Last data filed at 07/20/2023 1610 Gross per 24 hour  Intake 1188.52 ml  Output 800 ml  Net 388.52 ml   Weight change:   Physical Exam: Gen: NAD, laying flat in bed CVS: RRR Resp: fine crackles bibasilar, normal wob Abd: distended, NT Ext: 2+ pitting edema LLE>RLE Neuro: awake, alert  Imaging: DG CHEST PORT 1 VIEW  Result Date: 07/20/2023 CLINICAL DATA:  Hypoxia. EXAM: PORTABLE CHEST 1 VIEW COMPARISON:  Chest radiograph 07/07/2023. FINDINGS: New moderate pulmonary edema. Stable cardiomegaly and mediastinal contours. No pleural effusion or pneumothorax. IMPRESSION: New moderate pulmonary edema. Electronically Signed   By: Orvan Falconer M.D.   On: 07/20/2023 12:10   CT ELBOW RIGHT WO CONTRAST  Result Date: 2023-08-13 CLINICAL DATA:  Bone mass or bone pain, elbow. Benign features on x-ray. Right elbow redness and swelling extending up superiorly. Open wound over lateral side of the joint. Fell 10 days ago. EXAM: CT OF THE UPPER RIGHT EXTREMITY WITHOUT CONTRAST TECHNIQUE: Multidetector CT imaging of the upper right extremity was performed according to the standard protocol. RADIATION DOSE REDUCTION: This exam was performed according to the departmental dose-optimization program which includes automated exposure control, adjustment of the mA and/or kV according to patient size and/or use of iterative  reconstruction technique. COMPARISON:  Right elbow radiographs July 26, 2023, right elbow and forearm radiographs 11/02/2017 FINDINGS: Bones/Joint/Cartilage On remote 11/02/2017 radiographs, there is mild cortical step-off and irregularity of the lateral  aspect of the radial head and radial head-neck junction that is again seen on recent July 26, 2023 radiographs, an old healed fracture. This cortical irregularity and callus formation predominantly within the lateral aspect of the radial head is again seen on the current CT. No definite acute fracture line is seen. Moderate radiocapitellar joint space narrowing, subchondral sclerosis, peripheral osteophytosis. Moderate to severe trochlear-olecranon and coronoid joint space narrowing with subchondral sclerosis. Multiple degenerative subchondral cysts are seen within the trochlea, coronoid, and olecranon. No acute fracture is identified. No destructive bone lesion is seen. Ligaments Suboptimally assessed by CT. Muscles and Tendons There is mildly heterogeneous material measuring approximately 32 Hounsfield units within the posterior aspect of the triceps that is favored to represent an intramuscular hematoma, measuring up to approximately 4.0 x 2.0 x 8.9 cm (transverse by AP by craniocaudal). No gross tendon tear is seen. Soft tissues There is moderate subcutaneous fat edema and swelling throughout the posterior upper arm, greatest just above the elbow. IMPRESSION: 1. Old healed fracture of the lateral aspect of the radial head and radial head-neck junction. No definite acute fracture line is seen. 2. Likely posttraumatic hematoma within the triceps musculature. 3. 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 Electronically Signed   By: Neita Garnet M.D.   On: 07/11/2023 20:37    Labs: BMET Recent Labs  Lab 2023/07/26 1349 2023-07-26 2324 07/17/23 0646 07/27/2023 0746 07/19/23 0658 07/20/23 0901  NA 137  --  137 135 134* 136  K 5.0  --  4.3 4.2 4.7 4.7  CL 105  --  109 104 98 99  CO2 16*  --  16* 20* 23 20*  GLUCOSE 112*  --  68* 74 72 122*  BUN 75*  --  78* 76* 75* 83*  CREATININE 3.36*  --  3.24* 3.01* 3.20* 3.21*  CALCIUM 8.0*  --  7.5* 6.9* 6.4* 7.2*   PHOS  --  6.7* 7.6*  --   --  8.1*   CBC Recent Labs  Lab 2023-07-26 1349 07-26-2023 2324 07/17/23 1905 07/17/2023 0746 07/19/23 0658 07/20/23 0901  WBC 5.9   < > 7.3 7.7 10.4 12.8*  NEUTROABS 5.0  --   --   --  9.0*  --   HGB 8.2*   < > 9.1* 8.6* 8.4* 8.7*  HCT 24.1*   < > 27.1* 25.1* 24.7* 25.9*  MCV 96.4   < > 92.2 89.6 90.1 93.8  PLT 41*   < > 36* 36* 34* 33*   < > = values in this interval not displayed.    Medications:     Chlorhexidine Gluconate Cloth  6 each Topical Daily   feeding supplement  237 mL Oral BID BM   FLUoxetine  10 mg Oral Daily   midodrine  5 mg Oral TID WC   multivitamin with minerals  1 tablet Oral Daily   nicotine  7 mg Transdermal Daily   octreotide  200 mcg Subcutaneous Q12H   pantoprazole  40 mg Oral BID   pregabalin  25 mg Oral BID   thiamine (VITAMIN B1) injection  100 mg Intravenous Q24H      [name], MD [facility] 07/20/2023, 1:35 PM

## 2023-07-20 NOTE — Plan of Care (Signed)
  Problem: Education: Goal: Knowledge of General Education information will improve Description: Including pain rating scale, medication(s)/side effects and non-pharmacologic comfort measures 07/20/2023 1600 by Juluis Mire, RN Outcome: Not Progressing 07/20/2023 1559 by Juluis Mire, RN Outcome: Not Progressing   Problem: Health Behavior/Discharge Planning: Goal: Ability to manage health-related needs will improve 07/20/2023 1600 by Juluis Mire, RN Outcome: Progressing 07/20/2023 1559 by Juluis Mire, RN Outcome: Progressing   Problem: Clinical Measurements: Goal: Ability to maintain clinical measurements within normal limits will improve 07/20/2023 1600 by Juluis Mire, RN Outcome: Progressing 07/20/2023 1559 by Juluis Mire, RN Outcome: Progressing Goal: Will remain free from infection 07/20/2023 1600 by Juluis Mire, RN Outcome: Progressing 07/20/2023 1559 by Juluis Mire, RN Outcome: Progressing Goal: Diagnostic test results will improve 07/20/2023 1600 by Juluis Mire, RN Outcome: Progressing 07/20/2023 1559 by Juluis Mire, RN Outcome: Progressing Goal: Respiratory complications will improve 07/20/2023 1600 by Juluis Mire, RN Outcome: Not Progressing 07/20/2023 1559 by Juluis Mire, RN Outcome: Progressing Goal: Cardiovascular complication will be avoided 07/20/2023 1600 by Juluis Mire, RN Outcome: Progressing 07/20/2023 1559 by Juluis Mire, RN Outcome: Progressing   Problem: Activity: Goal: Risk for activity intolerance will decrease 07/20/2023 1600 by Juluis Mire, RN Outcome: Progressing 07/20/2023 1559 by Juluis Mire, RN Outcome: Progressing   Problem: Nutrition: Goal: Adequate nutrition will be maintained 07/20/2023 1600 by Juluis Mire, RN Outcome: Progressing 07/20/2023 1559 by Juluis Mire, RN Outcome: Progressing   Problem:  Coping: Goal: Level of anxiety will decrease 07/20/2023 1600 by Juluis Mire, RN Outcome: Not Progressing 07/20/2023 1559 by Juluis Mire, RN Outcome: Not Progressing   Problem: Elimination: Goal: Will not experience complications related to bowel motility 07/20/2023 1600 by Juluis Mire, RN Outcome: Progressing 07/20/2023 1559 by Juluis Mire, RN Outcome: Progressing Goal: Will not experience complications related to urinary retention 07/20/2023 1600 by Juluis Mire, RN Outcome: Progressing 07/20/2023 1559 by Juluis Mire, RN Outcome: Progressing   Problem: Pain Managment: Goal: General experience of comfort will improve 07/20/2023 1600 by Juluis Mire, RN Outcome: Progressing 07/20/2023 1559 by Juluis Mire, RN Outcome: Progressing   Problem: Safety: Goal: Ability to remain free from injury will improve 07/20/2023 1600 by Juluis Mire, RN Outcome: Progressing 07/20/2023 1559 by Juluis Mire, RN Outcome: Progressing   Problem: Skin Integrity: Goal: Risk for impaired skin integrity will decrease 07/20/2023 1600 by Juluis Mire, RN Outcome: Progressing 07/20/2023 1559 by Juluis Mire, RN Outcome: Progressing

## 2023-07-20 NOTE — Progress Notes (Signed)
PROGRESS NOTE    Erika [name]  AOZ:[id number] DOB: 09/06/1956 DOA: 07/17/2023 PCP: Claiborne Rigg, NP  Chief Complaint  Patient presents with   Fall    Brief Narrative:   67 year old with hx RA, chronic pain on lyrica, depression, cirrhosis, etoh use, and multiple other medical issues who presented after [place]he fall on 9/13.  Imaging showed L hip hematoma.  She was hypotensive in ED with AKI.    She was admitted in the setting of acute blood loss anemia, heme positive stool in setting of cirrhosis, L hip hematoma, and acute kidney injury.  GI, renal following.  Assessment & Plan:   Principal Problem:   Blood loss anemia Active Problems:   Rheumatoid arthritis (HCC)   Alcohol use disorder   Thrombocytopenia (HCC)   GERD (gastroesophageal reflux disease)   Tobacco abuse   Bipolar 2 disorder (HCC)   Prolonged QT interval   Cardiomegaly   Occult blood positive stool   Acute encephalopathy   Gastric ulcer with hemorrhage   Duodenal ulcer disease   AKI (acute kidney injury) (HCC)   Secondary esophageal varices without bleeding (HCC)   Alcoholic cirrhosis of liver with ascites (HCC)  Hypotension Acute Blood Loss Anemia CT at presentation with 34 cubic cm subcutaneous hematoma lateral to L proximal femur  S/p 1 unit pRBC S/p 1 unit platelets Has received some albumin  Continue midodrine  Acute Kidney Injury  Hepatorenal Syndrome Renal US with R sided increased renal parenchymal echogenicity (medical renal disease), no hydro Follow urinalysis (pending) Net positive 8 L, trend daily weights, I/O Indwelling foley in place  Midodrine, octreotide Now appears overloaded with CXR with pulm edema - lasix per renal Appreciate renal assistance    Alcoholic cirrhosis Ascites Thrombocytopenia IgM HBc +, will follow  Meldl-NA 29, 3 mo mortality 96.2 Will start lactulose given elevated ammonia (no asterixis noted on exam today, but agitation) EGD with grade 2 esopahgeal  varices and portal gastropathy, several small shallow gastric ulcers, 3 superficial duodenal ulcers (follow biopsies) -> needs BID ppi x8 weeks, then daily long term Will need nonselective beta blocker for portal hypertension (currently on hold with AKI)  9/15 trace ascites, insufficient for paracentesis  Abx have been d/c'd  Anemia Heme Positive Stool EGD as above Continue PPI BID   Fall on admission CT R elbow with old healed fx of lateral aspect of the radial head and radial head-neck junction, posttraumatic hematoma within the triceps musculature, moderate radiocapitellar and 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 Aundria Rud or Dr. Everardo Pacific    Electrolyte abnormalities noted-calcium corrects with albumin-no correction at this time urine charge   Rheumatoid arthritis--has outpatient follow-up scheduled-hold Lyrica for now given AKI   Prediabetic Noted - no hypoglycemic meds due to hx hypoglycemia here   Depression-start lower dose of antidepressants given risk of encephalopathy in the setting of AKI (prozac) --- venlafaxine apparently caused dizziness      DVT prophylaxis: SCD - thrombocytopenia Code Status: full Family Communication: son at bedside Disposition:   Status is: Inpatient Remains inpatient appropriate because: need for continued inpatient care   Consultants:  GI Renal  IR  Procedures:  EGD Impression  - Grade II esophageal varices. No bleeding stigmata present. Not banded as they were not felt to be source of bleeding, and patient' s platelet count is 36. -  Non- bleeding gastric ulcers with no stigmata of bleeding. Biopsied. - Portal hypertensive gastropathy. - Non- bleeding duodenal ulcers with no stigmata of bleeding. - Suspect peptic ulcer disease as the source of the patient' s melena and acute blood loss  anemia. Suspect NSAIDs as the likely cause of the ulcers. Recommendations - Patient has [facility] contact number available for emergencies. The signs and symptoms of potential delayed complications were discussed with the patient. Return to normal activities tomorrow. Written discharge instructions were provided to the patient. - Resume regular diet. - Continue present medications. - Await pathology results. - Recommend starting carvedilol 3. 25 mg PO BID at some point to reduce risk of variceal bleeding. Would not start now in the setting of acute kidney injury. - Ok to switch PPI to PO. Recommend twice daily PPI for 8 weeks, then decrease to once daily. - Would continue octreotide for another day given presence of varices and acute kidney injury - Continue empiric antibiotics for 72 hours total then stop. - Recommend nephrology consult given lack of improvement in SCr despite albumin/ volume challenge. Consider starting midodrine. - Avoid all NSAIDs indefinitely given renal insufficiency and peptic ulcers. Ok to use Tylenol for joint pain, but recommend using < 3 grams per day.  Echo IMPRESSIONS     1. Left ventricular ejection fraction, by estimation, is 60 to 65%. The  left ventricle has normal function. The left ventricle has no regional  wall motion abnormalities. Left ventricular diastolic parameters are  consistent with Grade II diastolic  dysfunction (pseudonormalization).   2. Right ventricular systolic function is normal. The right ventricular  size is normal.   3. Left atrial size was moderately dilated.   4. The mitral valve is normal in structure. Trivial mitral valve  regurgitation. No evidence of mitral stenosis.   5. Tricuspid valve regurgitation is mild to moderate.   6. The aortic valve is tricuspid. There is mild calcification of the  aortic valve. Aortic valve regurgitation is not visualized. Aortic valve  sclerosis is present, with no evidence of aortic valve stenosis.   7. The  inferior vena cava is normal in size with greater than 50%  respiratory variability, suggesting right atrial pressure of 3 mmHg.   Antimicrobials:  Anti-infectives (From admission, onward)    Start     Dose/Rate Route Frequency Ordered Stop   07/17/23 1145  cefTRIAXone (ROCEPHIN) 2 g in sodium chloride 0.9 % 100 mL IVPB  Status:  Discontinued        2 g 200 mL/hr over 30 Minutes Intravenous Daily 07/17/23 1053 07/19/2023 1456   07/17/23 1145  rifaximin (XIFAXAN) tablet 550 mg  Status:  Discontinued        550 mg Oral 2 times daily 07/17/23 1053 07/03/2023 1456       Subjective: Asking to go home Son at bedside  Objective: Vitals:   07/20/23 0759 07/20/23 1115 07/20/23 1512 07/20/23 1942  BP: (!) 113/49 126/88 (!) 112/43 134/61  Pulse: 76 74 78 83  Resp:  16 16 18   Temp: 97.8 F (36.6 C) 98 F (36.7 C) 98.3 F (36.8 C) 98.1 F (36.7 C)  TempSrc: Oral   Oral  SpO2: 93% 95% 96% (!) 87%  Weight:      Height:        Intake/Output Summary (Last 24 hours) at 07/20/2023 2011 Last data filed at 07/20/2023 1500 Gross per 24 hour  Intake 360 ml  Output 450 ml  Net -90 ml  Filed Weights   07/20/2023 1328  Weight: 65.8 kg    Examination:  General exam: Appears calm and comfortable  Respiratory system: unlabored Cardiovascular system: RRR Gastrointestinal system: Abdomen is nontender, distended Central nervous system: Alert, no asterixis  Extremities:R arm in splint, TED hose to lower extremities    Data Reviewed: I have personally reviewed following labs and imaging studies  CBC: Recent Labs  Lab 07/30/2023 1349 07/13/2023 2324 07/17/23 1233 07/17/23 1905 July 24, 2023 0746 07/19/23 0658 07/20/23 0901  WBC 5.9   < > 8.8 7.3 7.7 10.4 12.8*  NEUTROABS 5.0  --   --   --   --  9.0*  --   HGB 8.2*   < > 8.3* 9.1* 8.6* 8.4* 8.7*  HCT 24.1*   < > 24.9* 27.1* 25.1* 24.7* 25.9*  MCV 96.4   < > 91.2 92.2 89.6 90.1 93.8  PLT 41*   < > 42* 36* 36* 34* 33*   < > = values in this  interval not displayed.    Basic Metabolic Panel: Recent Labs  Lab 07/23/2023 1349 07/12/2023 2324 07/17/23 0646 07/24/23 0746 07/19/23 0658 07/20/23 0901  NA 137  --  137 135 134* 136  K 5.0  --  4.3 4.2 4.7 4.7  CL 105  --  109 104 98 99  CO2 16*  --  16* 20* 23 20*  GLUCOSE 112*  --  68* 74 72 122*  BUN 75*  --  78* 76* 75* 83*  CREATININE 3.36*  --  3.24* 3.01* 3.20* 3.21*  CALCIUM 8.0*  --  7.5* 6.9* 6.4* 7.2*  MG  --  2.4 2.2  --   --   --   PHOS  --  6.7* 7.6*  --   --  8.1*    GFR: Estimated Creatinine Clearance: 14.8 mL/min (Corin Tilly) (by C-G formula based on SCr of 3.21 mg/dL (H)).  Liver Function Tests: Recent Labs  Lab 07/28/2023 2324 07/17/23 0646 07/19/23 0658 07/20/23 0901  AST 24 23 31   --   ALT 14 13 12   --   ALKPHOS 104 88 79  --   BILITOT 4.5* 4.9* 5.1*  --   PROT 5.7* 5.5* 5.5*  --   ALBUMIN 1.5* 1.7* 1.6* 2.2*    CBG: No results for input(s): "GLUCAP" in the last 168 hours.   Recent Results (from the past 240 hour(s))  MRSA Next Gen by PCR, Nasal     Status: None   Collection Time: 07/19/23  6:22 PM   Specimen: Nasal Mucosa; Nasal Swab  Result Value Ref Range Status   MRSA by PCR Next Gen NOT DETECTED NOT DETECTED Final    Comment: (NOTE) The GeneXpert MRSA Assay (FDA approved for NASAL specimens only), is one component of [facility] comprehensive MRSA colonization surveillance program. It is not intended to diagnose MRSA infection nor to guide or monitor treatment for MRSA infections. Test performance is not FDA approved in patients less than 81 years old. Performed at [facility], [address] [place], [place] [address]          Radiology Studies: DG CHEST PORT 1 VIEW  Result Date: 07/20/2023 CLINICAL DATA:  Hypoxia. EXAM: PORTABLE CHEST 1 VIEW COMPARISON:  Chest radiograph 07/31/2023. FINDINGS: New moderate pulmonary edema. Stable cardiomegaly and mediastinal contours. No pleural effusion or pneumothorax. IMPRESSION: New moderate  pulmonary edema. Electronically Signed   By: Orvan Falconer M.D.   On: 07/20/2023 12:10  Scheduled Meds:  Chlorhexidine Gluconate Cloth  6 each Topical Daily   feeding supplement  237 mL Oral BID BM   FLUoxetine  10 mg Oral Daily   midodrine  10 mg Oral TID WC   multivitamin with minerals  1 tablet Oral Daily   nicotine  7 mg Transdermal Daily   octreotide  200 mcg Subcutaneous Q12H   pantoprazole  40 mg Oral BID   pregabalin  25 mg Oral BID   thiamine (VITAMIN B1) injection  100 mg Intravenous Q24H   Continuous Infusions:   LOS: 4 days    Time spent: over 30 min    Lacretia Nicks, MD [facility]   To contact the attending provider between 7A-7P or the covering provider during after hours 7P-7A, please log into the web site [email or web address] and access using universal [place] password for that web site. If you do not have the password, please call the hospital operator.  07/20/2023, 8:11 PM

## 2023-07-20 NOTE — Progress Notes (Addendum)
Patient sating 73% on RA with no improvement this morning. Pt at resting state. Pt lungs diminished with crackles. Pt stating she feels SOB. Nasal cannula applied with 2L. MD Skeet Simmer paged via epic chat. Patient has lots of bruising and large hematoma - MD paged for potential PE r/o.  Pt stating 0/10 pain; so concern for relation to pain is absent.

## 2023-07-20 NOTE — Progress Notes (Signed)
PT Cancellation Note  Patient Details Name: Erika [name] MRN: [id number] DOB: [birth year]   Cancelled Treatment:    Reason Eval/Treat Not Completed: Medical issues which prohibited therapy   Events noted. Will hold PT while cause for desaturation is investigated.  Kathlyn Sacramento, PT, DPT [facility]  Rehabilitation Services Physical Therapist Office: [phone] Website: [email or web address]  Berton Mount 07/20/2023, 9:49 AM

## 2023-07-20 NOTE — Progress Notes (Signed)
Physical Therapy Treatment Patient Details Name: Erika [name] MRN: [id number] DOB: 1956-09-29 Today's Date: 07/20/2023   History of Present Illness 67 y.o. female presenting 2023/08/13 after ground level fall with head strike. PMH significant for rheumatoid arthritis, cirrhosis secondary to alcohol abuse, bipolar disorder, history of portal hypertension, history of from a cytopenia, GERD, hypertension.    PT Comments  Limited treatment today. Pt agreeable only to sit EOB, which required up to mod assist for sequencing and trunk support to rise. Min assist to remain balanced at EOB. Was willing to perform some lower level exercises in bed as well. She states she removed her [place] prior to PT entering room because she was trying to make a phone call, sats were noted to be at 79%. Quickly resolved with [place] reapplied to 97%. Initially confused, agitated, and anxious, cognition gradually improved after reapplication of [place]. Pt educated extensively on leaving [place] in place due to risk of hypoxemia. RN notified. Patient will continue to benefit from skilled physical therapy services to further improve independence with functional mobility.     If plan is discharge home, recommend the following: A lot of help with walking and/or transfers;A lot of help with bathing/dressing/bathroom;Assistance with cooking/housework;Help with stairs or ramp for entrance   Can travel by private vehicle     No  Equipment Recommendations  Rolling walker (2 wheels);BSC/3in1    Recommendations for Other Services       Precautions / Restrictions Precautions Precautions: Fall Precaution Comments: R UE splint. No weight bearing limitations documented, no fracture. Just swelling. Required Braces or Orthoses: Splint/Cast Splint/Cast - Date Prophylactic Dressing Applied (if applicable): 07/19/23 Restrictions Weight Bearing Restrictions: Yes LLE Weight Bearing: Non weight bearing     Mobility  Bed Mobility Overal bed  mobility: Needs Assistance Bed Mobility: Supine to Sit, Sit to Supine     Supine to sit: Mod assist Sit to supine: Min assist   General bed mobility comments: Mod assist for LEs and trunk to rise to EOB. Pt anxious tries to return to supine quickly but with cues able to follow through. Min assist for LEs back into bed, able to reposition up in bed with max cues for technique, pushing through LEs and pulling through LUE on rail with bed in trendelenburg.    Transfers                   General transfer comment: refused    Ambulation/Gait               General Gait Details: refused   Stairs             Wheelchair Mobility     Tilt Bed    Modified Rankin (Stroke Patients Only)       Balance Overall balance assessment: Needs assistance Sitting-balance support: Single extremity supported, Feet unsupported Sitting balance-Leahy Scale: Poor Sitting balance - Comments: min assist for balance, progressing towards CGA at EOB today. Fearful                                    Cognition Arousal: Alert Behavior During Therapy: Agitated, Anxious Overall Cognitive Status: No family/caregiver present to determine baseline cognitive functioning Area of Impairment: Orientation, Memory, Problem solving, Following commands                 Orientation Level: Time, Situation (Gets month and location correct.)   Memory: Decreased  short-term memory Following Commands: Follows one step commands inconsistently, Follows one step commands with increased time     Problem Solving: Requires verbal cues, Requires tactile cues, Slow processing, Difficulty sequencing General Comments: More oriented towards end of session with O2 applied.        Exercises General Exercises - Lower Extremity Ankle Circles/Pumps: AROM, Both, 10 reps, Supine Quad Sets: Strengthening, Both, 10 reps, Supine Heel Slides: Strengthening, Both, 10 reps, Supine Straight Leg  Raises: Strengthening, Both, 10 reps, Supine Other Exercises Other Exercises: Seated balance at EOB, only tolerated for short period of time due to confusion agitation and anxiousness.    General Comments General comments (skin integrity, edema, etc.): Pt had removed [place], sating at 79%, confused, agitated. Reapplied and quickly improved to 97% on 4L. RN notified. Pt more oriented and calm after application.      Pertinent Vitals/Pain Pain Assessment Pain Assessment: PAINAD Breathing: normal Negative Vocalization: none Facial Expression: sad, frightened, frown Body Language: tense, distressed pacing, fidgeting Consolability: no need to console PAINAD Score: 2 Pain Location: R elbow, left hip Pain Descriptors / Indicators: Grimacing, Guarding, Discomfort, Sore Pain Intervention(s): Monitored during session, Repositioned    Home Living                          Prior Function            PT Goals (current goals can now be found in the care plan section) Acute Rehab PT Goals Patient Stated Goal: Less pain with getting up and moving PT Goal Formulation: With patient Time For Goal Achievement: 07/31/23 Potential to Achieve Goals: Good Progress towards PT goals: Progressing toward goals    Frequency    Min 1X/week      PT Plan      Co-evaluation              AM-PAC PT "6 Clicks" Mobility   Outcome Measure  Help needed turning from your back to your side while in a flat bed without using bedrails?: A Little Help needed moving from lying on your back to sitting on the side of a flat bed without using bedrails?: A Lot Help needed moving to and from a bed to a chair (including a wheelchair)?: A Lot Help needed standing up from a chair using your arms (e.g., wheelchair or bedside chair)?: A Lot Help needed to walk in hospital room?: Total Help needed climbing 3-5 steps with a railing? : Total 6 Click Score: 11    End of Session   Activity Tolerance:  Treatment limited secondary to agitation;Treatment limited secondary to medical complications (Comment) (anxious, confused, Sats low but improved when O2 applied.) Patient left: in bed;with call bell/phone within reach;with bed alarm set Nurse Communication: Mobility status;Other (comment) (O2 79% on RA when PT entered room, pt states she removed to make a phone call.) PT Visit Diagnosis: Unsteadiness on feet (R26.81);Muscle weakness (generalized) (M62.81);Other abnormalities of gait and mobility (R26.89);Difficulty in walking, not elsewhere classified (R26.2);Pain;History of falling (Z91.81) Pain - Right/Left:  (Bilateral) Pain - part of body: Leg;Arm     Time: 0981-1914 PT Time Calculation (min) (ACUTE ONLY): 14 min  Charges:    $Therapeutic Exercise: 8-22 mins PT General Charges $$ ACUTE PT VISIT: 1 Visit                     Kathlyn Sacramento, PT, DPT [facility]  Rehabilitation Services Physical Therapist Office: [phone] Website: [email or web address]  Berton Mount 07/20/2023, 4:57 PM

## 2023-07-21 ENCOUNTER — Inpatient Hospital Stay (HOSPITAL_COMMUNITY): Payer: 59

## 2023-07-21 DIAGNOSIS — I5033 Acute on chronic diastolic (congestive) heart failure: Secondary | ICD-10-CM | POA: Diagnosis not present

## 2023-07-21 DIAGNOSIS — D500 Iron deficiency anemia secondary to blood loss (chronic): Secondary | ICD-10-CM | POA: Diagnosis not present

## 2023-07-21 DIAGNOSIS — I480 Paroxysmal atrial fibrillation: Secondary | ICD-10-CM | POA: Diagnosis not present

## 2023-07-21 DIAGNOSIS — K766 Portal hypertension: Secondary | ICD-10-CM | POA: Diagnosis not present

## 2023-07-21 DIAGNOSIS — G9340 Encephalopathy, unspecified: Secondary | ICD-10-CM | POA: Diagnosis not present

## 2023-07-21 DIAGNOSIS — K767 Hepatorenal syndrome: Secondary | ICD-10-CM | POA: Diagnosis not present

## 2023-07-21 DIAGNOSIS — K259 Gastric ulcer, unspecified as acute or chronic, without hemorrhage or perforation: Secondary | ICD-10-CM | POA: Diagnosis not present

## 2023-07-21 LAB — URINALYSIS, ROUTINE W REFLEX MICROSCOPIC
Bilirubin Urine: NEGATIVE
Glucose, UA: NEGATIVE mg/dL
Ketones, ur: NEGATIVE mg/dL
Leukocytes,Ua: NEGATIVE
Nitrite: NEGATIVE
Protein, ur: NEGATIVE mg/dL
Specific Gravity, Urine: 1.013 (ref 1.005–1.030)
pH: 5 (ref 5.0–8.0)

## 2023-07-21 LAB — MAGNESIUM: Magnesium: 2.2 mg/dL (ref 1.7–2.4)

## 2023-07-21 LAB — BLOOD GAS, ARTERIAL
Acid-base deficit: 1.5 mmol/L (ref 0.0–2.0)
Bicarbonate: 24.3 mmol/L (ref 20.0–28.0)
O2 Saturation: 97.5 %
Patient temperature: 36.8
pCO2 arterial: 44 mmHg (ref 32–48)
pH, Arterial: 7.35 (ref 7.35–7.45)
pO2, Arterial: 90 mmHg (ref 83–108)

## 2023-07-21 LAB — CBC WITH DIFFERENTIAL/PLATELET
Abs Immature Granulocytes: 0.19 10*3/uL — ABNORMAL HIGH (ref 0.00–0.07)
Basophils Absolute: 0.1 10*3/uL (ref 0.0–0.1)
Basophils Relative: 0 %
Eosinophils Absolute: 0 10*3/uL (ref 0.0–0.5)
Eosinophils Relative: 0 %
HCT: 25.7 % — ABNORMAL LOW (ref 36.0–46.0)
Hemoglobin: 8.4 g/dL — ABNORMAL LOW (ref 12.0–15.0)
Immature Granulocytes: 2 %
Lymphocytes Relative: 9 %
Lymphs Abs: 1.1 10*3/uL (ref 0.7–4.0)
MCH: 30.4 pg (ref 26.0–34.0)
MCHC: 32.7 g/dL (ref 30.0–36.0)
MCV: 93.1 fL (ref 80.0–100.0)
Monocytes Absolute: 0.5 10*3/uL (ref 0.1–1.0)
Monocytes Relative: 4 %
Neutro Abs: 11.2 10*3/uL — ABNORMAL HIGH (ref 1.7–7.7)
Neutrophils Relative %: 85 %
Platelets: 40 10*3/uL — ABNORMAL LOW (ref 150–400)
RBC: 2.76 MIL/uL — ABNORMAL LOW (ref 3.87–5.11)
RDW: 15.2 % (ref 11.5–15.5)
WBC: 13.1 10*3/uL — ABNORMAL HIGH (ref 4.0–10.5)
nRBC: 0 % (ref 0.0–0.2)

## 2023-07-21 LAB — COMPREHENSIVE METABOLIC PANEL
ALT: 16 U/L (ref 0–44)
AST: 47 U/L — ABNORMAL HIGH (ref 15–41)
Albumin: 2 g/dL — ABNORMAL LOW (ref 3.5–5.0)
Alkaline Phosphatase: 77 U/L (ref 38–126)
Anion gap: 16 — ABNORMAL HIGH (ref 5–15)
BUN: 95 mg/dL — ABNORMAL HIGH (ref 8–23)
CO2: 20 mmol/L — ABNORMAL LOW (ref 22–32)
Calcium: 7.6 mg/dL — ABNORMAL LOW (ref 8.9–10.3)
Chloride: 102 mmol/L (ref 98–111)
Creatinine, Ser: 3.28 mg/dL — ABNORMAL HIGH (ref 0.44–1.00)
GFR, Estimated: 15 mL/min — ABNORMAL LOW (ref >60)
Glucose, Bld: 151 mg/dL — ABNORMAL HIGH (ref 70–99)
Potassium: 5.1 mmol/L (ref 3.5–5.1)
Sodium: 138 mmol/L (ref 135–145)
Total Bilirubin: 7.4 mg/dL — ABNORMAL HIGH (ref 0.3–1.2)
Total Protein: 6.1 g/dL — ABNORMAL LOW (ref 6.5–8.1)

## 2023-07-21 LAB — SEDIMENTATION RATE: Sed Rate: 54 mm/hr — ABNORMAL HIGH (ref 0–22)

## 2023-07-21 LAB — CBC
HCT: 23 % — ABNORMAL LOW (ref 36.0–46.0)
Hemoglobin: 7.6 g/dL — ABNORMAL LOW (ref 12.0–15.0)
MCH: 31 pg (ref 26.0–34.0)
MCHC: 33 g/dL (ref 30.0–36.0)
MCV: 93.9 fL (ref 80.0–100.0)
Platelets: 55 10*3/uL — ABNORMAL LOW (ref 150–400)
RBC: 2.45 MIL/uL — ABNORMAL LOW (ref 3.87–5.11)
RDW: 15.3 % (ref 11.5–15.5)
WBC: 13.2 10*3/uL — ABNORMAL HIGH (ref 4.0–10.5)
nRBC: 0 % (ref 0.0–0.2)

## 2023-07-21 LAB — GLUCOSE, CAPILLARY
Glucose-Capillary: 147 mg/dL — ABNORMAL HIGH (ref 70–99)
Glucose-Capillary: 136 mg/dL — ABNORMAL HIGH (ref 70–99)
Glucose-Capillary: 136 mg/dL — ABNORMAL HIGH (ref 70–99)
Glucose-Capillary: 139 mg/dL — ABNORMAL HIGH (ref 70–99)

## 2023-07-21 LAB — SODIUM, URINE, RANDOM: Sodium, Ur: 38 mmol/L

## 2023-07-21 LAB — AMMONIA: Ammonia: 84 umol/L — ABNORMAL HIGH (ref 9–35)

## 2023-07-21 LAB — PHOSPHORUS: Phosphorus: 9 mg/dL — ABNORMAL HIGH (ref 2.5–4.6)

## 2023-07-21 LAB — LACTIC ACID, PLASMA
Lactic Acid, Venous: 3.2 mmol/L (ref 0.5–1.9)
Lactic Acid, Venous: 3.4 mmol/L (ref 0.5–1.9)

## 2023-07-21 LAB — CREATININE, URINE, RANDOM: Creatinine, Urine: 63 mg/dL

## 2023-07-21 LAB — C-REACTIVE PROTEIN: CRP: 23.3 mg/dL — ABNORMAL HIGH (ref <1.0)

## 2023-07-21 MED ORDER — PANTOPRAZOLE SODIUM 40 MG IV SOLR
40.0000 mg | INTRAVENOUS | Status: DC
  Administered 2023-07-21 – 2023-07-22 (×2): 40 mg via INTRAVENOUS
  Filled 2023-07-21 (×2): qty 10

## 2023-07-21 MED ORDER — DEXMEDETOMIDINE HCL IN NACL 400 MCG/100ML IV SOLN
0.0000 ug/kg/h | INTRAVENOUS | Status: DC
  Administered 2023-07-22: 0.4 ug/kg/h via INTRAVENOUS
  Administered 2023-07-23: 0.6 ug/kg/h via INTRAVENOUS
  Administered 2023-07-23 – 2023-07-24 (×2): 0.4 ug/kg/h via INTRAVENOUS
  Administered 2023-07-24: 0.5 ug/kg/h via INTRAVENOUS
  Administered 2023-07-25: 0.4 ug/kg/h via INTRAVENOUS
  Administered 2023-07-26: 0.7 ug/kg/h via INTRAVENOUS
  Administered 2023-07-26: 0.6 ug/kg/h via INTRAVENOUS
  Administered 2023-07-26: 0.5 ug/kg/h via INTRAVENOUS
  Administered 2023-07-27 (×2): 1.1 ug/kg/h via INTRAVENOUS
  Administered 2023-07-27: 1.2 ug/kg/h via INTRAVENOUS
  Administered 2023-07-28 (×2): 1 ug/kg/h via INTRAVENOUS
  Administered 2023-07-28 (×2): 1.2 ug/kg/h via INTRAVENOUS
  Administered 2023-07-29 (×2): 1 ug/kg/h via INTRAVENOUS
  Filled 2023-07-21 (×18): qty 100

## 2023-07-21 MED ORDER — SODIUM CHLORIDE 0.9 % IV SOLN
2.0000 g | INTRAVENOUS | Status: DC
  Administered 2023-07-21 – 2023-07-24 (×4): 2 g via INTRAVENOUS
  Filled 2023-07-21 (×4): qty 12.5

## 2023-07-21 MED ORDER — LINEZOLID 600 MG/300ML IV SOLN
600.0000 mg | Freq: Two times a day (BID) | INTRAVENOUS | Status: DC
  Administered 2023-07-21 – 2023-07-25 (×8): 600 mg via INTRAVENOUS
  Filled 2023-07-21 (×9): qty 300

## 2023-07-21 MED ORDER — ORAL CARE MOUTH RINSE: 15.0000 mL | OROMUCOSAL | Status: DC | PRN

## 2023-07-21 MED ORDER — NOREPINEPHRINE 4 MG/250ML-% IV SOLN
2.0000 ug/min | INTRAVENOUS | Status: DC
  Administered 2023-07-21: 2 ug/min via INTRAVENOUS
  Administered 2023-07-22: 4 ug/min via INTRAVENOUS
  Administered 2023-07-23: 3 ug/min via INTRAVENOUS
  Administered 2023-07-24: 4 ug/min via INTRAVENOUS
  Filled 2023-07-21 (×4): qty 250

## 2023-07-21 MED ORDER — ALBUMIN HUMAN 25 % IV SOLN
25.0000 g | Freq: Once | INTRAVENOUS | Status: AC
  Administered 2023-07-21: 25 g via INTRAVENOUS
  Filled 2023-07-21: qty 100

## 2023-07-21 MED ORDER — MIDODRINE HCL 5 MG PO TABS
10.0000 mg | ORAL_TABLET | Freq: Once | ORAL | Status: DC
  Filled 2023-07-21: qty 2

## 2023-07-21 MED ORDER — ALBUMIN HUMAN 25 % IV SOLN
50.0000 g | Freq: Four times a day (QID) | INTRAVENOUS | Status: AC
  Administered 2023-07-21 – 2023-07-22 (×4): 50 g via INTRAVENOUS
  Filled 2023-07-21 (×4): qty 200

## 2023-07-21 MED ORDER — METOPROLOL TARTRATE 5 MG/5ML IV SOLN
5.0000 mg | INTRAVENOUS | Status: DC | PRN
  Administered 2023-07-21: 5 mg via INTRAVENOUS

## 2023-07-21 MED ORDER — SODIUM CHLORIDE 0.9 % IV SOLN
2.0000 g | INTRAVENOUS | Status: DC
  Administered 2023-07-21: 2 g via INTRAVENOUS
  Filled 2023-07-21: qty 20

## 2023-07-21 MED ORDER — HYDROMORPHONE HCL 1 MG/ML IJ SOLN
INTRAMUSCULAR | Status: AC
  Filled 2023-07-21: qty 1

## 2023-07-21 MED ORDER — ADULT MULTIVITAMIN W/MINERALS CH: 1.0000 | ORAL_TABLET | Freq: Every day | ORAL | Status: DC

## 2023-07-21 MED ORDER — LACTULOSE 10 GM/15ML PO SOLN: 20.0000 g | Freq: Two times a day (BID) | ORAL | Status: DC

## 2023-07-21 MED ORDER — HYDROMORPHONE HCL 1 MG/ML IJ SOLN: 0.5000 mg | INTRAMUSCULAR | Status: DC | PRN

## 2023-07-21 MED ORDER — ORAL CARE MOUTH RINSE
15.0000 mL | OROMUCOSAL | Status: DC
  Administered 2023-07-22 – 2023-07-29 (×31): 15 mL via OROMUCOSAL

## 2023-07-21 MED ORDER — RIFAXIMIN 550 MG PO TABS: 550.0000 mg | ORAL_TABLET | Freq: Two times a day (BID) | ORAL | Status: DC

## 2023-07-21 MED ORDER — SODIUM CHLORIDE 0.9 % IV SOLN: 250.0000 mL | INTRAVENOUS | Status: DC

## 2023-07-21 MED ORDER — MIDODRINE HCL 5 MG PO TABS: 10.0000 mg | ORAL_TABLET | Freq: Three times a day (TID) | ORAL | Status: DC

## 2023-07-21 MED ORDER — FUROSEMIDE 10 MG/ML IJ SOLN
60.0000 mg | Freq: Two times a day (BID) | INTRAMUSCULAR | Status: AC
  Administered 2023-07-21: 60 mg via INTRAVENOUS
  Filled 2023-07-21: qty 8

## 2023-07-21 MED ORDER — SODIUM CHLORIDE 0.9 % IV SOLN: INTRAVENOUS | Status: DC | PRN

## 2023-07-21 MED ORDER — LACTULOSE ENEMA
300.0000 mL | Freq: Two times a day (BID) | ORAL | Status: DC
  Administered 2023-07-22 – 2023-07-25 (×6): 300 mL via RECTAL
  Filled 2023-07-21 (×10): qty 300

## 2023-07-21 MED ORDER — HYDROMORPHONE HCL 1 MG/ML IJ SOLN
1.0000 mg | Freq: Once | INTRAMUSCULAR | Status: AC
  Administered 2023-07-21: 1 mg via INTRAVENOUS

## 2023-07-21 MED ORDER — PHENYLEPHRINE 80 MCG/ML (10ML) SYRINGE FOR IV PUSH (FOR BLOOD PRESSURE SUPPORT)
80.0000 ug | PREFILLED_SYRINGE | Freq: Once | INTRAVENOUS | Status: AC | PRN
  Administered 2023-07-21: 80 ug via INTRAVENOUS
  Filled 2023-07-21: qty 10

## 2023-07-21 NOTE — Progress Notes (Signed)
[id number] - notified by CCMD that patient is in [facility]. Entering the room, the patient is alert and talking. BP stable at 138/93. Rapid response nurse called and Dr. Lowell Guitar paged.

## 2023-07-21 NOTE — Consult Note (Addendum)
Cardiology Consultation   Patient ID: Erika [name] MRN: [id number]; DOB: 07-30-1956  Admit date: 07/06/2023 Date of Consult: 07/21/2023  PCP:  Claiborne Rigg, NP    HeartCare Providers Cardiologist:  None -- new to heart care    Patient Profile:   Erika [name] is a 67 y.o. female with a hx of rheumatoid arthritis, cirrhosis secondary to alcohol abuse, bipolar disorder, history of portal hypertension, GERD, HTN, chronic pain on lyrica who is being seen 07/21/2023 for the evaluation of SVT at the request of Dr. Lowell Guitar.  History of Present Illness:   Ms. Erika [name] is a 67 year old female with above medical history. Per chart review, patient does not have any past cardiac history and does not follow with a cardiologist.   Patient presented to the ED on 9/14 after she had a fall the day prior. Patient reportedly landed on the curb on her side, and hit her head. CT head, maxillofacial, and cervical negative. CXR normal. CT left hip with a 34 cubic cm subcutaneous hematoma lateral to the left proximal femur without fracture, pelvic ascites. Hemoglobin was noted to trend down from 10>8 and platelets down to 40. Hemoccult stool positive for blood. Patient was hypotensive in the ED, and it was suspected that she had hypovolemic, distributive shock on admission due to large hip hematoma. She was transfused with platelets, PRBCs, and albumin. GI was consulted on 9/15 and she was started on PPI and octreotide. She underwent EGD on 9/16 that showed grade 2 varices, no overt bleeding, nonbleeding ulcers, portal hypertensive gastropathy. She started to retain urine on 9/16 and had a foley catheter placed. She was also given IV iron. On 9/17, nephrology was consulted for AKI. MAPs were below goal, and patient was started on midodrine 5 mg TID. Midodrine was increased to 10 mg TID on 9/18.   On 9/19, patient was noted to have a few episodes of wide-complex tachycardia. Later in the  morning, patient had narrow complex tachycardia concerning for atrial fibrillation.  Cardiology consulted. Labs from 9/19 showed K 5.1, creatinine 3.28, mag 2.2, hemoglobin 8.4, platelets 40. CXR showed worsened pulmonary edema. Cardiology consulted for atrial fibrillation   At the time of my evaluation, patient was very confused and unable to answer any questions. She was moaning loudly and moving her arms and legs in the bed. Nursing staff present at bedside reported that her confusion has been worsening for the past few days.    Past Medical History:  Diagnosis Date   Arthritis    Cirrhosis (HCC)    Depression    GERD (gastroesophageal reflux disease)    History of shingles 10/18/2018   Hypertension    Rheumatoid arthritis(714.0)     Past Surgical History:  Procedure Laterality Date   AMPUTATION TOE Right 04/24/2021   Procedure: AMPUTATION TOE;  Surgeon: Edwin Cap, DPM;  Location: [facility] OR;  Service: Podiatry;  Laterality: Right;   AMPUTATION TOE Left 07/11/2021   Procedure: AMPUTATION TOE;  Surgeon: Asencion Islam, DPM;  Location: [facility];  Service: Podiatry;  Laterality: Left;   AMPUTATION TOE Right 04/21/2022   Procedure: AMPUTATION RIGHT FOURTH TOE;  Surgeon: Louann Sjogren, DPM;  Location: [facility] OR;  Service: Podiatry;  Laterality: Right;   BIOPSY  07/13/2023   Procedure: BIOPSY;  Surgeon: Jenel Lucks, MD;  Location: [facility] ENDOSCOPY;  Service: Gastroenterology;;   BREAST REDUCTION SURGERY     ESOPHAGOGASTRODUODENOSCOPY (EGD) WITH PROPOFOL N/A 07/26/2023   Procedure: ESOPHAGOGASTRODUODENOSCOPY (  Cardiology Consultation   Patient ID: Erika [name] MRN: [id number]; DOB: 07-30-1956  Admit date: 07/06/2023 Date of Consult: 07/21/2023  PCP:  Claiborne Rigg, NP    HeartCare Providers Cardiologist:  None -- new to heart care    Patient Profile:   Erika [name] is a 67 y.o. female with a hx of rheumatoid arthritis, cirrhosis secondary to alcohol abuse, bipolar disorder, history of portal hypertension, GERD, HTN, chronic pain on lyrica who is being seen 07/21/2023 for the evaluation of SVT at the request of Dr. Lowell Guitar.  History of Present Illness:   Ms. Erika [name] is a 67 year old female with above medical history. Per chart review, patient does not have any past cardiac history and does not follow with a cardiologist.   Patient presented to the ED on 9/14 after she had a fall the day prior. Patient reportedly landed on the curb on her side, and hit her head. CT head, maxillofacial, and cervical negative. CXR normal. CT left hip with a 34 cubic cm subcutaneous hematoma lateral to the left proximal femur without fracture, pelvic ascites. Hemoglobin was noted to trend down from 10>8 and platelets down to 40. Hemoccult stool positive for blood. Patient was hypotensive in the ED, and it was suspected that she had hypovolemic, distributive shock on admission due to large hip hematoma. She was transfused with platelets, PRBCs, and albumin. GI was consulted on 9/15 and she was started on PPI and octreotide. She underwent EGD on 9/16 that showed grade 2 varices, no overt bleeding, nonbleeding ulcers, portal hypertensive gastropathy. She started to retain urine on 9/16 and had a foley catheter placed. She was also given IV iron. On 9/17, nephrology was consulted for AKI. MAPs were below goal, and patient was started on midodrine 5 mg TID. Midodrine was increased to 10 mg TID on 9/18.   On 9/19, patient was noted to have a few episodes of wide-complex tachycardia. Later in the  morning, patient had narrow complex tachycardia concerning for atrial fibrillation.  Cardiology consulted. Labs from 9/19 showed K 5.1, creatinine 3.28, mag 2.2, hemoglobin 8.4, platelets 40. CXR showed worsened pulmonary edema. Cardiology consulted for atrial fibrillation   At the time of my evaluation, patient was very confused and unable to answer any questions. She was moaning loudly and moving her arms and legs in the bed. Nursing staff present at bedside reported that her confusion has been worsening for the past few days.    Past Medical History:  Diagnosis Date   Arthritis    Cirrhosis (HCC)    Depression    GERD (gastroesophageal reflux disease)    History of shingles 10/18/2018   Hypertension    Rheumatoid arthritis(714.0)     Past Surgical History:  Procedure Laterality Date   AMPUTATION TOE Right 04/24/2021   Procedure: AMPUTATION TOE;  Surgeon: Edwin Cap, DPM;  Location: [facility];  Service: Podiatry;  Laterality: Right;   AMPUTATION TOE Left 07/11/2021   Procedure: AMPUTATION TOE;  Surgeon: Asencion Islam, DPM;  Location: [facility];  Service: Podiatry;  Laterality: Left;   AMPUTATION TOE Right 04/21/2022   Procedure: AMPUTATION RIGHT FOURTH TOE;  Surgeon: Louann Sjogren, DPM;  Location: [facility];  Service: Podiatry;  Laterality: Right;   BIOPSY  07/13/2023   Procedure: BIOPSY;  Surgeon: Jenel Lucks, MD;  Location: [facility] ENDOSCOPY;  Service: Gastroenterology;;   BREAST REDUCTION SURGERY     ESOPHAGOGASTRODUODENOSCOPY (EGD) WITH PROPOFOL N/A 07/26/2023   Procedure: ESOPHAGOGASTRODUODENOSCOPY (  Cardiology Consultation   Patient ID: Erika [name] MRN: [id number]; DOB: 07-30-1956  Admit date: 07/06/2023 Date of Consult: 07/21/2023  PCP:  Claiborne Rigg, NP    HeartCare Providers Cardiologist:  None -- new to heart care    Patient Profile:   Erika [name] is a 67 y.o. female with a hx of rheumatoid arthritis, cirrhosis secondary to alcohol abuse, bipolar disorder, history of portal hypertension, GERD, HTN, chronic pain on lyrica who is being seen 07/21/2023 for the evaluation of SVT at the request of Dr. Lowell Guitar.  History of Present Illness:   Ms. Erika [name] is a 67 year old female with above medical history. Per chart review, patient does not have any past cardiac history and does not follow with a cardiologist.   Patient presented to the ED on 9/14 after she had a fall the day prior. Patient reportedly landed on the curb on her side, and hit her head. CT head, maxillofacial, and cervical negative. CXR normal. CT left hip with a 34 cubic cm subcutaneous hematoma lateral to the left proximal femur without fracture, pelvic ascites. Hemoglobin was noted to trend down from 10>8 and platelets down to 40. Hemoccult stool positive for blood. Patient was hypotensive in the ED, and it was suspected that she had hypovolemic, distributive shock on admission due to large hip hematoma. She was transfused with platelets, PRBCs, and albumin. GI was consulted on 9/15 and she was started on PPI and octreotide. She underwent EGD on 9/16 that showed grade 2 varices, no overt bleeding, nonbleeding ulcers, portal hypertensive gastropathy. She started to retain urine on 9/16 and had a foley catheter placed. She was also given IV iron. On 9/17, nephrology was consulted for AKI. MAPs were below goal, and patient was started on midodrine 5 mg TID. Midodrine was increased to 10 mg TID on 9/18.   On 9/19, patient was noted to have a few episodes of wide-complex tachycardia. Later in the  morning, patient had narrow complex tachycardia concerning for atrial fibrillation.  Cardiology consulted. Labs from 9/19 showed K 5.1, creatinine 3.28, mag 2.2, hemoglobin 8.4, platelets 40. CXR showed worsened pulmonary edema. Cardiology consulted for atrial fibrillation   At the time of my evaluation, patient was very confused and unable to answer any questions. She was moaning loudly and moving her arms and legs in the bed. Nursing staff present at bedside reported that her confusion has been worsening for the past few days.    Past Medical History:  Diagnosis Date   Arthritis    Cirrhosis (HCC)    Depression    GERD (gastroesophageal reflux disease)    History of shingles 10/18/2018   Hypertension    Rheumatoid arthritis(714.0)     Past Surgical History:  Procedure Laterality Date   AMPUTATION TOE Right 04/24/2021   Procedure: AMPUTATION TOE;  Surgeon: Edwin Cap, DPM;  Location: [facility];  Service: Podiatry;  Laterality: Right;   AMPUTATION TOE Left 07/11/2021   Procedure: AMPUTATION TOE;  Surgeon: Asencion Islam, DPM;  Location: [facility];  Service: Podiatry;  Laterality: Left;   AMPUTATION TOE Right 04/21/2022   Procedure: AMPUTATION RIGHT FOURTH TOE;  Surgeon: Louann Sjogren, DPM;  Location: [facility];  Service: Podiatry;  Laterality: Right;   BIOPSY  07/13/2023   Procedure: BIOPSY;  Surgeon: Jenel Lucks, MD;  Location: [facility] ENDOSCOPY;  Service: Gastroenterology;;   BREAST REDUCTION SURGERY     ESOPHAGOGASTRODUODENOSCOPY (EGD) WITH PROPOFOL N/A 07/26/2023   Procedure: ESOPHAGOGASTRODUODENOSCOPY (  contours. No pleural effusion or pneumothorax. IMPRESSION: New moderate pulmonary edema. Electronically Signed   By: Orvan Falconer M.D.   On: 07/20/2023 12:10   CT ELBOW RIGHT WO CONTRAST  Result Date: 07/24/2023 CLINICAL DATA:  Bone mass or bone pain, elbow. Benign features on x-ray. Right elbow redness and swelling extending up superiorly. Open wound over lateral side of the joint. Fell 10 days ago. EXAM: CT OF THE UPPER RIGHT EXTREMITY WITHOUT CONTRAST TECHNIQUE: Multidetector CT imaging of the upper right extremity was performed according to the standard protocol. RADIATION DOSE REDUCTION: This exam was performed according to the departmental dose-optimization program which includes automated exposure control, adjustment of the mA and/or kV according to patient size and/or use of iterative reconstruction technique. COMPARISON:  Right elbow radiographs 07/06/2023, right elbow and forearm radiographs 11/02/2017 FINDINGS: Bones/Joint/Cartilage On remote 11/02/2017 radiographs, there is mild cortical step-off and irregularity of the lateral  aspect of the radial head and radial head-neck junction that is again seen on recent 07/14/2023 radiographs, an old healed fracture. This cortical irregularity and callus formation predominantly within the lateral aspect of the radial head is again seen on the current CT. No definite acute fracture line is seen. Moderate radiocapitellar joint space narrowing, subchondral sclerosis, peripheral osteophytosis. Moderate to severe trochlear-olecranon and coronoid joint space narrowing with subchondral sclerosis. Multiple degenerative subchondral cysts are seen within the trochlea, coronoid, and olecranon. No acute fracture is identified. No destructive bone lesion is seen. Ligaments Suboptimally assessed by CT. Muscles and Tendons There is mildly heterogeneous material measuring approximately 32 Hounsfield units within the posterior aspect of the triceps that is favored to represent an intramuscular hematoma, measuring up to approximately 4.0 x 2.0 x 8.9 cm (transverse by AP by craniocaudal). No gross tendon tear is seen. Soft tissues There is moderate subcutaneous fat edema and swelling throughout the posterior upper arm, greatest just above the elbow. IMPRESSION: 1. Old healed fracture of the lateral aspect of the radial head and radial head-neck junction. No definite acute fracture line is seen. 2. Likely posttraumatic hematoma within the triceps musculature. 3. 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 Electronically Signed   By: Neita Garnet M.D.   On: 07/08/2023 20:37   ECHOCARDIOGRAM COMPLETE  Result Date: 07/17/2023    ECHOCARDIOGRAM REPORT   Patient Name:   SUDA WILLS Lunn Date of Exam: 07/17/2023 Medical Rec #:  [id number]          Height:       61.0 in Accession #:    [id number]         Weight:       145.0 lb Date of Birth:  21-Jul-1956           BSA:          1.647 m Patient Age:    67 years           BP:           102/45 mmHg Patient Gender: F                   HR:           77 bpm. Exam Location:  Inpatient Procedure: 2D Echo, Color Doppler, Cardiac Doppler and Intracardiac            Opacification Agent Indications:    Cardiomegaly  History:        Patient has no prior history of Echocardiogram examinations.  contours. No pleural effusion or pneumothorax. IMPRESSION: New moderate pulmonary edema. Electronically Signed   By: Orvan Falconer M.D.   On: 07/20/2023 12:10   CT ELBOW RIGHT WO CONTRAST  Result Date: 07/24/2023 CLINICAL DATA:  Bone mass or bone pain, elbow. Benign features on x-ray. Right elbow redness and swelling extending up superiorly. Open wound over lateral side of the joint. Fell 10 days ago. EXAM: CT OF THE UPPER RIGHT EXTREMITY WITHOUT CONTRAST TECHNIQUE: Multidetector CT imaging of the upper right extremity was performed according to the standard protocol. RADIATION DOSE REDUCTION: This exam was performed according to the departmental dose-optimization program which includes automated exposure control, adjustment of the mA and/or kV according to patient size and/or use of iterative reconstruction technique. COMPARISON:  Right elbow radiographs 07/06/2023, right elbow and forearm radiographs 11/02/2017 FINDINGS: Bones/Joint/Cartilage On remote 11/02/2017 radiographs, there is mild cortical step-off and irregularity of the lateral  aspect of the radial head and radial head-neck junction that is again seen on recent 07/14/2023 radiographs, an old healed fracture. This cortical irregularity and callus formation predominantly within the lateral aspect of the radial head is again seen on the current CT. No definite acute fracture line is seen. Moderate radiocapitellar joint space narrowing, subchondral sclerosis, peripheral osteophytosis. Moderate to severe trochlear-olecranon and coronoid joint space narrowing with subchondral sclerosis. Multiple degenerative subchondral cysts are seen within the trochlea, coronoid, and olecranon. No acute fracture is identified. No destructive bone lesion is seen. Ligaments Suboptimally assessed by CT. Muscles and Tendons There is mildly heterogeneous material measuring approximately 32 Hounsfield units within the posterior aspect of the triceps that is favored to represent an intramuscular hematoma, measuring up to approximately 4.0 x 2.0 x 8.9 cm (transverse by AP by craniocaudal). No gross tendon tear is seen. Soft tissues There is moderate subcutaneous fat edema and swelling throughout the posterior upper arm, greatest just above the elbow. IMPRESSION: 1. Old healed fracture of the lateral aspect of the radial head and radial head-neck junction. No definite acute fracture line is seen. 2. Likely posttraumatic hematoma within the triceps musculature. 3. 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 Electronically Signed   By: Neita Garnet M.D.   On: 07/08/2023 20:37   ECHOCARDIOGRAM COMPLETE  Result Date: 07/17/2023    ECHOCARDIOGRAM REPORT   Patient Name:   SUDA WILLS Lunn Date of Exam: 07/17/2023 Medical Rec #:  [id number]          Height:       61.0 in Accession #:    [id number]         Weight:       145.0 lb Date of Birth:  21-Jul-1956           BSA:          1.647 m Patient Age:    67 years           BP:           102/45 mmHg Patient Gender: F                   HR:           77 bpm. Exam Location:  Inpatient Procedure: 2D Echo, Color Doppler, Cardiac Doppler and Intracardiac            Opacification Agent Indications:    Cardiomegaly  History:        Patient has no prior history of Echocardiogram examinations.  contours. No pleural effusion or pneumothorax. IMPRESSION: New moderate pulmonary edema. Electronically Signed   By: Orvan Falconer M.D.   On: 07/20/2023 12:10   CT ELBOW RIGHT WO CONTRAST  Result Date: 07/24/2023 CLINICAL DATA:  Bone mass or bone pain, elbow. Benign features on x-ray. Right elbow redness and swelling extending up superiorly. Open wound over lateral side of the joint. Fell 10 days ago. EXAM: CT OF THE UPPER RIGHT EXTREMITY WITHOUT CONTRAST TECHNIQUE: Multidetector CT imaging of the upper right extremity was performed according to the standard protocol. RADIATION DOSE REDUCTION: This exam was performed according to the departmental dose-optimization program which includes automated exposure control, adjustment of the mA and/or kV according to patient size and/or use of iterative reconstruction technique. COMPARISON:  Right elbow radiographs 07/06/2023, right elbow and forearm radiographs 11/02/2017 FINDINGS: Bones/Joint/Cartilage On remote 11/02/2017 radiographs, there is mild cortical step-off and irregularity of the lateral  aspect of the radial head and radial head-neck junction that is again seen on recent 07/14/2023 radiographs, an old healed fracture. This cortical irregularity and callus formation predominantly within the lateral aspect of the radial head is again seen on the current CT. No definite acute fracture line is seen. Moderate radiocapitellar joint space narrowing, subchondral sclerosis, peripheral osteophytosis. Moderate to severe trochlear-olecranon and coronoid joint space narrowing with subchondral sclerosis. Multiple degenerative subchondral cysts are seen within the trochlea, coronoid, and olecranon. No acute fracture is identified. No destructive bone lesion is seen. Ligaments Suboptimally assessed by CT. Muscles and Tendons There is mildly heterogeneous material measuring approximately 32 Hounsfield units within the posterior aspect of the triceps that is favored to represent an intramuscular hematoma, measuring up to approximately 4.0 x 2.0 x 8.9 cm (transverse by AP by craniocaudal). No gross tendon tear is seen. Soft tissues There is moderate subcutaneous fat edema and swelling throughout the posterior upper arm, greatest just above the elbow. IMPRESSION: 1. Old healed fracture of the lateral aspect of the radial head and radial head-neck junction. No definite acute fracture line is seen. 2. Likely posttraumatic hematoma within the triceps musculature. 3. 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 Electronically Signed   By: Neita Garnet M.D.   On: 07/08/2023 20:37   ECHOCARDIOGRAM COMPLETE  Result Date: 07/17/2023    ECHOCARDIOGRAM REPORT   Patient Name:   SUDA WILLS Lunn Date of Exam: 07/17/2023 Medical Rec #:  [id number]          Height:       61.0 in Accession #:    [id number]         Weight:       145.0 lb Date of Birth:  21-Jul-1956           BSA:          1.647 m Patient Age:    67 years           BP:           102/45 mmHg Patient Gender: F                   HR:           77 bpm. Exam Location:  Inpatient Procedure: 2D Echo, Color Doppler, Cardiac Doppler and Intracardiac            Opacification Agent Indications:    Cardiomegaly  History:        Patient has no prior history of Echocardiogram examinations.  Cardiology Consultation   Patient ID: Erika [name] MRN: [id number]; DOB: 07-30-1956  Admit date: 07/06/2023 Date of Consult: 07/21/2023  PCP:  Claiborne Rigg, NP    HeartCare Providers Cardiologist:  None -- new to heart care    Patient Profile:   Erika [name] is a 67 y.o. female with a hx of rheumatoid arthritis, cirrhosis secondary to alcohol abuse, bipolar disorder, history of portal hypertension, GERD, HTN, chronic pain on lyrica who is being seen 07/21/2023 for the evaluation of SVT at the request of Dr. Lowell Guitar.  History of Present Illness:   Ms. Erika [name] is a 67 year old female with above medical history. Per chart review, patient does not have any past cardiac history and does not follow with a cardiologist.   Patient presented to the ED on 9/14 after she had a fall the day prior. Patient reportedly landed on the curb on her side, and hit her head. CT head, maxillofacial, and cervical negative. CXR normal. CT left hip with a 34 cubic cm subcutaneous hematoma lateral to the left proximal femur without fracture, pelvic ascites. Hemoglobin was noted to trend down from 10>8 and platelets down to 40. Hemoccult stool positive for blood. Patient was hypotensive in the ED, and it was suspected that she had hypovolemic, distributive shock on admission due to large hip hematoma. She was transfused with platelets, PRBCs, and albumin. GI was consulted on 9/15 and she was started on PPI and octreotide. She underwent EGD on 9/16 that showed grade 2 varices, no overt bleeding, nonbleeding ulcers, portal hypertensive gastropathy. She started to retain urine on 9/16 and had a foley catheter placed. She was also given IV iron. On 9/17, nephrology was consulted for AKI. MAPs were below goal, and patient was started on midodrine 5 mg TID. Midodrine was increased to 10 mg TID on 9/18.   On 9/19, patient was noted to have a few episodes of wide-complex tachycardia. Later in the  morning, patient had narrow complex tachycardia concerning for atrial fibrillation.  Cardiology consulted. Labs from 9/19 showed K 5.1, creatinine 3.28, mag 2.2, hemoglobin 8.4, platelets 40. CXR showed worsened pulmonary edema. Cardiology consulted for atrial fibrillation   At the time of my evaluation, patient was very confused and unable to answer any questions. She was moaning loudly and moving her arms and legs in the bed. Nursing staff present at bedside reported that her confusion has been worsening for the past few days.    Past Medical History:  Diagnosis Date   Arthritis    Cirrhosis (HCC)    Depression    GERD (gastroesophageal reflux disease)    History of shingles 10/18/2018   Hypertension    Rheumatoid arthritis(714.0)     Past Surgical History:  Procedure Laterality Date   AMPUTATION TOE Right 04/24/2021   Procedure: AMPUTATION TOE;  Surgeon: Edwin Cap, DPM;  Location: [facility] OR;  Service: Podiatry;  Laterality: Right;   AMPUTATION TOE Left 07/11/2021   Procedure: AMPUTATION TOE;  Surgeon: Asencion Islam, DPM;  Location: [facility];  Service: Podiatry;  Laterality: Left;   AMPUTATION TOE Right 04/21/2022   Procedure: AMPUTATION RIGHT FOURTH TOE;  Surgeon: Louann Sjogren, DPM;  Location: [facility] OR;  Service: Podiatry;  Laterality: Right;   BIOPSY  07/13/2023   Procedure: BIOPSY;  Surgeon: Jenel Lucks, MD;  Location: [facility] ENDOSCOPY;  Service: Gastroenterology;;   BREAST REDUCTION SURGERY     ESOPHAGOGASTRODUODENOSCOPY (EGD) WITH PROPOFOL N/A 07/26/2023   Procedure: ESOPHAGOGASTRODUODENOSCOPY (  contours. No pleural effusion or pneumothorax. IMPRESSION: New moderate pulmonary edema. Electronically Signed   By: Orvan Falconer M.D.   On: 07/20/2023 12:10   CT ELBOW RIGHT WO CONTRAST  Result Date: 07/24/2023 CLINICAL DATA:  Bone mass or bone pain, elbow. Benign features on x-ray. Right elbow redness and swelling extending up superiorly. Open wound over lateral side of the joint. Fell 10 days ago. EXAM: CT OF THE UPPER RIGHT EXTREMITY WITHOUT CONTRAST TECHNIQUE: Multidetector CT imaging of the upper right extremity was performed according to the standard protocol. RADIATION DOSE REDUCTION: This exam was performed according to the departmental dose-optimization program which includes automated exposure control, adjustment of the mA and/or kV according to patient size and/or use of iterative reconstruction technique. COMPARISON:  Right elbow radiographs 07/06/2023, right elbow and forearm radiographs 11/02/2017 FINDINGS: Bones/Joint/Cartilage On remote 11/02/2017 radiographs, there is mild cortical step-off and irregularity of the lateral  aspect of the radial head and radial head-neck junction that is again seen on recent 07/14/2023 radiographs, an old healed fracture. This cortical irregularity and callus formation predominantly within the lateral aspect of the radial head is again seen on the current CT. No definite acute fracture line is seen. Moderate radiocapitellar joint space narrowing, subchondral sclerosis, peripheral osteophytosis. Moderate to severe trochlear-olecranon and coronoid joint space narrowing with subchondral sclerosis. Multiple degenerative subchondral cysts are seen within the trochlea, coronoid, and olecranon. No acute fracture is identified. No destructive bone lesion is seen. Ligaments Suboptimally assessed by CT. Muscles and Tendons There is mildly heterogeneous material measuring approximately 32 Hounsfield units within the posterior aspect of the triceps that is favored to represent an intramuscular hematoma, measuring up to approximately 4.0 x 2.0 x 8.9 cm (transverse by AP by craniocaudal). No gross tendon tear is seen. Soft tissues There is moderate subcutaneous fat edema and swelling throughout the posterior upper arm, greatest just above the elbow. IMPRESSION: 1. Old healed fracture of the lateral aspect of the radial head and radial head-neck junction. No definite acute fracture line is seen. 2. Likely posttraumatic hematoma within the triceps musculature. 3. 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 Electronically Signed   By: Neita Garnet M.D.   On: 07/08/2023 20:37   ECHOCARDIOGRAM COMPLETE  Result Date: 07/17/2023    ECHOCARDIOGRAM REPORT   Patient Name:   SUDA WILLS Lunn Date of Exam: 07/17/2023 Medical Rec #:  [id number]          Height:       61.0 in Accession #:    [id number]         Weight:       145.0 lb Date of Birth:  21-Jul-1956           BSA:          1.647 m Patient Age:    67 years           BP:           102/45 mmHg Patient Gender: F                   HR:           77 bpm. Exam Location:  Inpatient Procedure: 2D Echo, Color Doppler, Cardiac Doppler and Intracardiac            Opacification Agent Indications:    Cardiomegaly  History:        Patient has no prior history of Echocardiogram examinations.

## 2023-07-21 NOTE — Consult Note (Addendum)
NAME:  Erika [name], MRN:  [id number], DOB:  24-Feb-1956, LOS: 5 ADMISSION DATE:  08-04-23, CONSULTATION DATE:  07/21/23 REFERRING MD:  Grier Mitts., MD CHIEF COMPLAINT:  Hypotension/AMS   History of Present Illness:  Erika [name] is a 67 y.o. female with a hx of rheumatoid arthritis, cirrhosis secondary to alcohol abuse, bipolar disorder, history of portal hypertension, GERD, HTN, chronic pain on lyrica who was admitted 04-Aug-2023 for swelling of her left hip after suffering a fall the week before. She was admitted for acute blood loss anemia due to left hip hematoma and heme positive stool.   Today she developed runs of SVT treated with beta blockers. She developed hypotension and altered mental status with bradycardia this afternoon in which PCCM was consulted.   Nephrology is following for AKI in setting of hepato renal syndrome. Gi has been following for cirrhosis and esophageal varices and portal hypertension. She has been on lactulose and rifaximin for hepatic encephalopathy.  Pertinent  Medical History   Past Medical History:  Diagnosis Date   Arthritis    Cirrhosis (HCC)    Depression    GERD (gastroesophageal reflux disease)    History of shingles 10/18/2018   Hypertension    Rheumatoid arthritis(714.0)    Significant Hospital Events: Including procedures, antibiotic start and stop dates in addition to other pertinent events   08-04-2023 admitted 9/19 Altered mental status, hypotension and bradycardia, transferred to ICU  Interim History / Subjective:  As above. Patient not able to participate in history.  Objective   Blood pressure (!) 99/51, pulse (!) 49, temperature 98.2 F (36.8 C), temperature source Oral, resp. rate 12, height 5\' 1"  (1.549 m), weight 65.8 kg, SpO2 96%.        Intake/Output Summary (Last 24 hours) at 07/21/2023 1748 Last data filed at 07/21/2023 1200 Gross per 24 hour  Intake --  Output 1300 ml  Net -1300 ml   Filed Weights   08-04-2023  1328  Weight: 65.8 kg    Examination: General: Chronically ill appearing woman, no acute distress HENT: [place]/AT, sclera anicteric Lungs: scattered rales, no wheezing Cardiovascular: bradycardic, no murmurs Abdomen: soft, mildly distended, BS+, non-tender Extremities: warm, no edema. Eccymoses left leg Neuro: not alert, PERRL GU: Foley   Resolved Hospital Problem list     Assessment & Plan:   Septic shock  Rising white count, altered mental status and hypotension with unclear source at this time possible SBP given ascites vs pneumonia given airspace opacities on CXR -Start peripheral Levophed with map goal 65 or greater -Resume midodrine when able -Check blood cultures - cefepime and linezolid empiric coverage  Alcoholic Cirrhosis Ascitis Portal Hypertension Esophageal Varices - GI following - Start lactulose enemas given inability to take PO - hold on placing NG tube given varices - consider paracentesis for SBP work up - empirically covered with cefepime - monitor for GI bleeding - give albumin 50g q6 hours x 4 doses  AKI Hepatorenal syndrome Anion Gap Metabolic Acidosis - maintain map goal of 65 or higher - Nephrology following, received lasix 60mg  daily last two days  Anemia Thrombocytopenia - trend CBC  Acute on Chronic Diastolic Heart Failure Paroxysmal Atrial Fibrillation SVT - cardiology following - hold beta blockers at this time - correct electrolytes - diuresis as able  Best Practice (right click and "Reselect all SmartList Selections" daily)   Diet/type: NPO DVT prophylaxis: not indicated GI prophylaxis: PPI Lines: N/A Foley:  Yes, and it is still needed Code  Status:  full code Last date of multidisciplinary goals of care discussion [9/19 with son]  Labs   CBC: Recent Labs  Lab 07/15/2023 1349 07/27/2023 2324 07/17/23 1905 07-28-2023 0746 07/19/23 0658 07/20/23 0901 07/21/23 0828  WBC 5.9   < > 7.3 7.7 10.4 12.8* 13.1*  NEUTROABS 5.0  --    --   --  9.0*  --  11.2*  HGB 8.2*   < > 9.1* 8.6* 8.4* 8.7* 8.4*  HCT 24.1*   < > 27.1* 25.1* 24.7* 25.9* 25.7*  MCV 96.4   < > 92.2 89.6 90.1 93.8 93.1  PLT 41*   < > 36* 36* 34* 33* 40*   < > = values in this interval not displayed.    Basic Metabolic Panel: Recent Labs  Lab 07/12/2023 2324 07/17/23 0646 2023-07-28 0746 07/19/23 0658 07/20/23 0901 07/21/23 0828  NA  --  137 135 134* 136 138  K  --  4.3 4.2 4.7 4.7 5.1  CL  --  109 104 98 99 102  CO2  --  16* 20* 23 20* 20*  GLUCOSE  --  68* 74 72 122* 151*  BUN  --  78* 76* 75* 83* 95*  CREATININE  --  3.24* 3.01* 3.20* 3.21* 3.28*  CALCIUM  --  7.5* 6.9* 6.4* 7.2* 7.6*  MG 2.4 2.2  --   --   --  2.2  PHOS 6.7* 7.6*  --   --  8.1* 9.0*   GFR: Estimated Creatinine Clearance: 14.5 mL/min (A) (by C-G formula based on SCr of 3.28 mg/dL (H)). Recent Labs  Lab 07/25/2023 2324 07/17/23 0646 28-Jul-2023 0746 07/19/23 0658 07/20/23 0901 07/21/23 0828  PROCALCITON 1.86  --   --   --   --   --   WBC 5.9   < > 7.7 10.4 12.8* 13.1*   < > = values in this interval not displayed.    Liver Function Tests: Recent Labs  Lab 07/06/2023 2324 07/17/23 0646 07/19/23 0658 07/20/23 0901 07/21/23 0828  AST 24 23 31   --  47*  ALT 14 13 12   --  16  ALKPHOS 104 88 79  --  77  BILITOT 4.5* 4.9* 5.1*  --  7.4*  PROT 5.7* 5.5* 5.5*  --  6.1*  ALBUMIN 1.5* 1.7* 1.6* 2.2* 2.0*   No results for input(s): "LIPASE", "AMYLASE" in the last 168 hours. Recent Labs  Lab 07/19/2023 2324 07/17/23 1233 07/20/23 1448 07/21/23 0828  AMMONIA 109* 112* 84* 84*    ABG    Component Value Date/Time   PHART 7.35 07/21/2023 1726   PCO2ART 44 07/21/2023 1726   PO2ART 90 07/21/2023 1726   HCO3 24.3 07/21/2023 1726   ACIDBASEDEF 1.5 07/21/2023 1726   O2SAT 97.5 07/21/2023 1726     Coagulation Profile: Recent Labs  Lab 07/13/2023 2324 07/17/23 0646 28-Jul-2023 0746  INR 1.7* 1.6* 1.7*    Cardiac Enzymes: Recent Labs  Lab 07/31/2023 2324  CKTOTAL  24*    HbA1C: Hgb A1c MFr Bld  Date/Time Value Ref Range Status  04/24/2021 12:46 AM 5.6 4.8 - 5.6 % Final    Comment:    (NOTE) Pre diabetes:          5.7%-6.4%  Diabetes:              >6.4%  Glycemic control for   <7.0% adults with diabetes     CBG: Recent Labs  Lab 07/21/23 1137 07/21/23 1722  GLUCAP  147* 136*    Review of Systems:   Unable to obtain ROS due to mental status  Past Medical History:  She,  has a past medical history of Arthritis, Cirrhosis (HCC), Depression, GERD (gastroesophageal reflux disease), History of shingles (10/18/2018), Hypertension, and Rheumatoid arthritis(714.0).   Surgical History:   Past Surgical History:  Procedure Laterality Date   AMPUTATION TOE Right 04/24/2021   Procedure: AMPUTATION TOE;  Surgeon: Edwin Cap, DPM;  Location: [facility] OR;  Service: Podiatry;  Laterality: Right;   AMPUTATION TOE Left 07/11/2021   Procedure: AMPUTATION TOE;  Surgeon: Asencion Islam, DPM;  Location: [facility] OR;  Service: Podiatry;  Laterality: Left;   AMPUTATION TOE Right 04/21/2022   Procedure: AMPUTATION RIGHT FOURTH TOE;  Surgeon: Louann Sjogren, DPM;  Location: [facility] OR;  Service: Podiatry;  Laterality: Right;   BIOPSY  07/27/2023   Procedure: BIOPSY;  Surgeon: Jenel Lucks, MD;  Location: [facility] ENDOSCOPY;  Service: Gastroenterology;;   BREAST REDUCTION SURGERY     ESOPHAGOGASTRODUODENOSCOPY (EGD) WITH PROPOFOL N/A 07/15/2023   Procedure: ESOPHAGOGASTRODUODENOSCOPY (EGD) WITH PROPOFOL;  Surgeon: Jenel Lucks, MD;  Location[facility] ENDOSCOPY;  Service: Gastroenterology;  Laterality: N/A;   REDUCTION MAMMAPLASTY Bilateral      Social History:   reports that she has been smoking cigarettes. She uses smokeless tobacco. She reports that she does not currently use alcohol after a past usage of about 5.0 standard drinks of alcohol per week. She reports current drug use. Drug: Marijuana.   Family History:  Her family history includes Psoriasis in her  sister. There is no history of Breast cancer.   Allergies No Known Allergies   Home Medications  Prior to Admission medications   Medication Sig Start Date End Date Taking? Authorizing Provider  FLUoxetine (PROZAC) 20 MG capsule Take 1 capsule (20 mg total) by mouth daily. 07/08/23  Yes Claiborne Rigg, NP  lamoTRIgine (LAMICTAL) 100 MG tablet Take 1 tablet (100 mg total) by mouth 2 (two) times daily. 07/08/23  Yes Claiborne Rigg, NP  pregabalin (LYRICA) 100 MG capsule Take 1 capsule (100 mg total) by mouth 2 (two) times daily. 07/08/23  Yes Claiborne Rigg, NP  traZODone (DESYREL) 100 MG tablet Take 1 tablet (100 mg total) by mouth at bedtime. 07/08/23  Yes Claiborne Rigg, NP  venlafaxine XR (EFFEXOR-XR) 150 MG 24 hr capsule Take 1 capsule (150 mg total) by mouth daily with breakfast. 07/08/23  Yes Claiborne Rigg, NP  ARIPiprazole (ABILIFY) 5 MG tablet Take 5 mg by mouth daily. 07/11/23   [provider]  thiamine (VITAMIN B1) 100 MG tablet Take 1 tablet (100 mg total) by mouth daily. Patient not taking: Reported on 07/10/2023 07/08/23   Claiborne Rigg, NP     Critical care time: 50 minutes    Melody Comas, MD [facility] Office: [phone]   See Amion for personal pager PCCM on call pager [phone] until 7pm. Please call Elink 7p-7a. [phone]

## 2023-07-21 NOTE — Progress Notes (Signed)
OT Cancellation Note  Patient Details Name: Erika [name] MRN: [id number] DOB: August 12, 1956   Cancelled Treatment:    Reason Eval/Treat Not Completed: Medical issues which prohibited therapy (Per RN frequent SVT today, constant diarrhea, max confused in restraints and not following commands.)  Myrla Halsted, OTD, OTR/L [facility] Acute Rehabilitation Office: [phone]  Myrla Halsted 07/21/2023, 2:22 PM

## 2023-07-21 NOTE — Progress Notes (Signed)
eLink Physician-Brief Progress Note Patient Name: Erika [name] DOB: Dec 14, 1955 MRN: [id number]   Date of Service  07/21/2023  HPI/Events of Note  Patient chart reviewed.  These include prior notes, critical care consult as well as labs and pertinent imaging. Transferred to the ICU for hypotension with developed after receiving beta-blockers for SVT.  Currently agitated and screaming.  eICU Interventions  Video assessment of patient done.  Agitated and screaming but also appears to be in pain.  She has purulent discharge coming from her right arm and elbow region.  On linezolid and cefepime. For her pain, I will give her one-time dose of 1 mg of hydromorphone and put in for half a milligram every 2 hours as needed. Dexmedetomidine will be ordered just in case it is needed to come agitation once her pain has been relieved. Plan of care discussed in detail with bedside nurse.     Intervention Category Major Interventions: Delirium, psychosis, severe agitation - evaluation and management Intermediate Interventions: Pain - evaluation and management  Carilyn Goodpasture 07/21/2023, 9:32 PM

## 2023-07-21 NOTE — Progress Notes (Signed)
[facility] ROUNDING NOTE   Subjective: Patient has experienced some clinical deterioration today, manifested by hypotension and increasing agitation/disorientation.  She was noted to have new SVT with wide-complex tachycardia today, and was given metoprolol.  She was given Atarax morning for agitation. Her renal function has not improved with volume resuscitation, consistent with hepatorenal syndrome.  Underlying cocaine abuse may also be contributing per nephrology. She had a positive hepatitis B IgM, but subsequent viral load and E antibody were negative. Her smooth muscle antibody recently was noted to be positive.   Objective: Vital signs in last 24 hours: Temp:  [97.7 F (36.5 C)-98.3 F (36.8 C)] 97.7 F (36.5 C) (09/19 0722) Pulse Rate:  [74-155] 155 (09/19 0959) Resp:  [16-19] 19 (09/19 0722) BP: (103-138)/(43-93) 103/65 (09/19 1022) SpO2:  [87 %-96 %] 92 % (09/19 0722) Last BM Date : 07-19-2023 General: NAD, ill-appearing Caucasian female, somnolent, falls asleep during conversation, unable to engage in conversation Lungs:  CTA b/l, decreased breath sounds at bases Heart: Cardiac, no m/r/g Abdomen:  Soft, NT, mild distention, +BS  Skin: Slightly jaundiced, extensive ecchymoses along left hip    Intake/Output from previous day: 09/18 0701 - 09/19 0700 In: 240 [P.O.:240] Out: 900 [Urine:900] Intake/Output this shift: No intake/output data recorded.   Lab Results: Recent Labs    07/19/23 0658 07/20/23 0901 07/21/23 0828  WBC 10.4 12.8* 13.1*  HGB 8.4* 8.7* 8.4*  PLT 34* 33* 40*  MCV 90.1 93.8 93.1   BMET Recent Labs    07/19/23 0658 07/20/23 0901 07/21/23 0828  NA 134* 136 138  K 4.7 4.7 5.1  CL 98 99 102  CO2 23 20* 20*  GLUCOSE 72 122* 151*  BUN 75* 83* 95*  CREATININE 3.20* 3.21* 3.28*  CALCIUM 6.4* 7.2* 7.6*   LFT Recent Labs    07/19/23 0658 07/20/23 0901 07/21/23 0828  PROT 5.5*  --  6.1*  ALBUMIN 1.6* 2.2* 2.0*  AST 31   --  47*  ALT 12  --  16  ALKPHOS 79  --  77  BILITOT 5.1*  --  7.4*   PT/INR No results for input(s): "INR" in the last 72 hours.    Imaging/Other results: [place] ASCITES (ABDOMEN LIMITED)  Result Date: 07/21/2023 CLINICAL DATA:  History of cirrhosis EXAM: LIMITED ABDOMEN ULTRASOUND FOR ASCITES TECHNIQUE: Limited ultrasound survey for ascites was performed in all four abdominal quadrants. COMPARISON:  Abdominal ultrasound 07/17/2023 FINDINGS AND IMPRESSION: Small volume ascites in all 4 quadrants is similar to the prior study from 07/17/2023. Electronically Signed   By: Lesia Hausen M.D.   On: 07/21/2023 13:14   DG CHEST PORT 1 VIEW  Result Date: 07/21/2023 CLINICAL DATA:  Shortness of breath EXAM: PORTABLE CHEST 1 VIEW COMPARISON:  Chest radiograph 07/20/2023 FINDINGS: The heart is enlarged, unchanged. The upper mediastinal contours are normal Airspace opacity throughout both lungs has worsened in the interim likely reflecting worsened pulmonary edema. There is no significant pleural effusion. There is no pneumothorax There is no acute osseous abnormality. IMPRESSION: Worsened pulmonary edema. Electronically Signed   By: Lesia Hausen M.D.   On: 07/21/2023 13:12   DG CHEST PORT 1 VIEW  Result Date: 07/20/2023 CLINICAL DATA:  Hypoxia. EXAM: PORTABLE CHEST 1 VIEW COMPARISON:  Chest radiograph 07/09/2023. FINDINGS: New moderate pulmonary edema. Stable cardiomegaly and mediastinal contours. No pleural effusion or pneumothorax. IMPRESSION: New moderate pulmonary edema. Electronically Signed   By: Orvan Falconer M.D.   On: 07/20/2023 12:10  Assessment and Plan:  67 year old female with decompensated alcohol cirrhosis, admitted following fall at home, with acute kidney injury, anemia and dark stool.  She underwent an EGD September 16 notable for grade 2 esophageal varices and portal hypertensive gastropathy as well as numerous gastric and duodenal ulcers, attributed to frequent NSAID use.  Her  renal function has not improved with volume resuscitation.  She most likely has hepatorenal syndrome, followed by nephrology.  She had been stable until last 24 hours, where she has developed worsening mental status/agitation and hemodynamic instability (hypotension, SVT with wide-complex tachycardia, currently bradycardic following metoprolol).  She does have evidence of volume overload, which may be the inciting event for clinical deterioration, but other etiologies should be entertained such as recurrent bleed or infection.  Repeat ultrasound showed no change in ascites from previous (previously unable to find adequate fluid pocket).  Alcohol withdrawal possible, but seems less likely (patient had reported no alcohol use in the past 3 weeks).  She had a positive hepatitis B IgM, but subsequent negative viral load and E antibody.  I suspect this IgM was a false positive.  Her smooth muscle antibody is positive, certainly raising the possibility of concomitant autoimmune hepatitis on top of her alcoholic liver disease.  Given her decompensated cirrhosis, there is likely little benefit to treating her hepatitis with steroids or immunosuppressants.  Currently, recommend treating supportively.  With a diagnosis of hepatorenal syndrome, her prognosis is extremely poor.  Recommend further discussion of goals of care and engaging palliative care consult  MELD 3.0: 32 at 07/20/2023  9:01 AM MELD-Na: 30 at 07/20/2023  9:01 AM Calculated from: Serum Creatinine: 3.21 mg/dL (Using max of 3 mg/dL) at 2/84/1324  4:01 AM Serum Sodium: 136 mmol/L at 07/20/2023  9:01 AM Total Bilirubin: 5.1 mg/dL at 0/27/2536  6:44 AM Serum Albumin: 2.2 g/dL at 0/34/7425  9:56 AM INR(ratio): 1.7 at 07/12/2023  7:46 AM Age at listing (hypothetical): 46 years Sex: Female at 07/20/2023  9:01 AM    Decompensated cirrhosis with esophageal varices, portal hypertensive gastropathy, hepatic encephalopathy, hepatorenal syndrome,  primarily secondary to chronic alcohol, but may have underlying autoimmune hepatitis as well -No role for initiating therapy for autoimmune hepatitis in the setting of decompensated cirrhosis with hepatorenal syndrome - Alcohol abstinence  Hepatic encephalopathy/agitation -Continue lactulose for hepatic encephalopathy - Recommend adding rifaximin 550 twice daily - Agree with thiamine - Caution using sedatives  Esophageal varices without bleeding stigmata - Not banded due to absence of stigmata and severe thrombocytopenia - Would not recommend nonselective beta-blockers given persistent severe renal insufficiency  Hepatorenal syndrome -Continue octreotide 200 mcg twice daily and midodrine 10 mg 3 times daily   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

## 2023-07-21 NOTE — Progress Notes (Signed)
An USGPIV (ultrasound guided PIV) on the RFA 22G x 1.75" has been placed for short-term vasopressor infusion. A correctly placed ivWatch must be used when administering Vasopressors. Should this treatment be needed beyond 24 hours, central line access should be obtained.  It will be the responsibility of the bedside nurse to follow best practice to prevent extravasations.

## 2023-07-21 NOTE — Progress Notes (Signed)
Pt arrived to ICU approx 1845 from 3W Progressive. Pt arrived on 2L nasal cannula, Levophed iv gtt @ 37mcg/hr. BP stable with MAP >65. Pt confused, combative. Full assessment completed, handoff given to nightshift RN.

## 2023-07-21 NOTE — Progress Notes (Addendum)
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 Relatively bland urine sediment, no obstruction on imaging. Has been adequately volume resuscitated since she's been admitted (now hypervolemic) -Cr stable at 3.2 yesterday, labs pending -c/w octreotide and midodrine -no indication for RRT at this junction -Avoid nephrotoxic medications including NSAIDs and iodinated intravenous contrast exposure unless the latter is absolutely indicated.  Preferred narcotic agents for pain control are hydromorphone, fentanyl, and methadone. Morphine should not be used. Avoid Baclofen and avoid oral sodium phosphate and magnesium citrate based laxatives / bowel preps. Continue strict Input and Output monitoring. Will monitor the patient closely with you and intervene or adjust therapy as indicated by changes in clinical status/labs    EtOH Cirrhosis -GI following -HRS plan as above  Pulmonary edema, acute on chronic diastolic CHF exacerbation -lasix 60mg  IV BID x 1 day, reassess thereafter -repeat CXR today pending   Urinary retention -currently with foley   ABLA, hematoma Pancytopenia -s/p EGD 9/16, no overt bleeding -transfuse PRN for hgb <7 -per primary   Hypotension H/o HTN -midodrine as above -if BP is worsening then would recommend reassessing her hematoma to make sure it is not expanding, will defer to primary for this   Metabolic acidosis -stable -likely secondary to AKI and decreased lactate clearance from cirrhosis  Cocaine positive -will defer to primary service  Addendum: met panel reviewed. Cr stable at 3.28 however BUN is on the rise, will watch for now. Plan for diuresis still stands.  [name], MD [facility]  Subjective:   Patient seen and examined bedside. Currently in restraints. Seems confused but does report SOB. Labs pending   Objective:   BP (!) 117/56 (BP  Location: Left Arm)   Pulse 74   Temp 97.7 F (36.5 C) (Oral)   Resp 19   Ht 5\' 1"  (1.549 m)   Wt 65.8 kg   SpO2 92%   BMI 27.40 kg/m   Intake/Output Summary (Last 24 hours) at 07/21/2023 4098 Last data filed at 07/21/2023 0252 Gross per 24 hour  Intake 120 ml  Output 900 ml  Net -780 ml   Weight change:   Physical Exam: Gen: ill appearing, laying flat in bed CVS: RRR Resp: fine crackles bibasilar, normal wob Abd: distended, NT Ext: 1+ pitting edema LLE>RLE Neuro: awake, alert  Imaging: DG CHEST PORT 1 VIEW  Result Date: 07/20/2023 CLINICAL DATA:  Hypoxia. EXAM: PORTABLE CHEST 1 VIEW COMPARISON:  Chest radiograph 07/24/2023. FINDINGS: New moderate pulmonary edema. Stable cardiomegaly and mediastinal contours. No pleural effusion or pneumothorax. IMPRESSION: New moderate pulmonary edema. Electronically Signed   By: Orvan Falconer M.D.   On: 07/20/2023 12:10    Labs: BMET Recent Labs  Lab 07/17/2023 1349 07/11/2023 2324 07/17/23 0646 07/15/2023 0746 07/19/23 0658 07/20/23 0901  NA 137  --  137 135 134* 136  K 5.0  --  4.3 4.2 4.7 4.7  CL 105  --  109 104 98 99  CO2 16*  --  16* 20* 23 20*  GLUCOSE 112*  --  68* 74 72 122*  BUN 75*  --  78* 76* 75* 83*  CREATININE 3.36*  --  3.24* 3.01* 3.20* 3.21*  CALCIUM 8.0*  --  7.5* 6.9* 6.4* 7.2*  PHOS  --  6.7* 7.6*  --   --  8.1*   CBC Recent Labs  Lab 07/07/2023 1349 08/01/2023 2324 07/05/2023 0746  07/19/23 0658 07/20/23 0901 07/21/23 0828  WBC 5.9   < > 7.7 10.4 12.8* 13.1*  NEUTROABS 5.0  --   --  9.0*  --  11.2*  HGB 8.2*   < > 8.6* 8.4* 8.7* 8.4*  HCT 24.1*   < > 25.1* 24.7* 25.9* 25.7*  MCV 96.4   < > 89.6 90.1 93.8 93.1  PLT 41*   < > 36* 34* 33* 40*   < > = values in this interval not displayed.    Medications:     Chlorhexidine Gluconate Cloth  6 each Topical Daily   feeding supplement  237 mL Oral BID BM   FLUoxetine  10 mg Oral Daily   furosemide  60 mg Intravenous BID   lactulose  20 g Oral BID    midodrine  10 mg Oral TID WC   multivitamin with minerals  1 tablet Oral Daily   nicotine  7 mg Transdermal Daily   octreotide  200 mcg Subcutaneous Q12H   pantoprazole  40 mg Oral BID   pregabalin  25 mg Oral BID   thiamine (VITAMIN B1) injection  100 mg Intravenous Q24H      [name], MD [facility] 07/21/2023, 9:06 AM

## 2023-07-21 NOTE — Plan of Care (Signed)

## 2023-07-21 NOTE — Progress Notes (Signed)
Patient given metoprolol IV 5mg  for HR sustaining 150s. After PRN dose - patient HR dropped to 45bpm. BP stable. Patient alert and at baseline mentation. Dr. Lowell Guitar notified & received new orders

## 2023-07-21 NOTE — Progress Notes (Signed)
Pharmacy Antibiotic Note  Erika [name] is a 67 y.o. female admitted on 07/11/2023 s/p fall now with decompensated cirrhosis and concern for sepsis, manifested as AMS and hypotension.  Pharmacy has been consulted for Cefepime dosing.  Also of note, pt with AKI, SCr 3-3.2, baseline <1.  Plan: Cefepime 2gm IV q24h  Height: 5\' 1"  (154.9 cm) Weight: 65.8 kg (145 lb) IBW/kg (Calculated) : 47.8  Temp (24hrs), Avg:97.9 F (36.6 C), Min:97.7 F (36.5 C), Max:98.3 F (36.8 C)  Recent Labs  Lab 07/17/23 0646 07/17/23 1233 07/17/23 1905 07/13/2023 0746 07/19/23 0658 07/20/23 0901 07/21/23 0828  WBC DUPLICATE  7.9   < > 7.3 7.7 10.4 12.8* 13.1*  CREATININE 3.24*  --   --  3.01* 3.20* 3.21* 3.28*   < > = values in this interval not displayed.    Estimated Creatinine Clearance: 14.5 mL/min (A) (by C-G formula based on SCr of 3.28 mg/dL (H)).    No Known Allergies  Antimicrobials this admission: Ceftriaxone 9/15 >> 9/16 Cefepime 9/19 >>   Dose adjustments this admission:   Microbiology results:  9/17 MRSA PCR: negative  Thank you for allowing pharmacy to be a part of this patient's care.  Brandon Melnick 07/21/2023 2:37 PM

## 2023-07-21 NOTE — Progress Notes (Addendum)
PROGRESS NOTE    Erika [name]  UJW:[id number] DOB: 10-22-1956 DOA: 07/31/2023 PCP: Claiborne Rigg, NP  Chief Complaint  Patient presents with   Fall    Brief Narrative:   67 year old with hx RA, chronic pain on lyrica, depression, cirrhosis, etoh use, and multiple other medical issues who presented after Erika [name] fall on 9/13.  Imaging showed L hip hematoma.  She was hypotensive in ED with AKI.    She was admitted in the setting of acute blood loss anemia, heme positive stool in setting of cirrhosis, L hip hematoma, and acute kidney injury.  GI, renal following.  Assessment & Plan:   Principal Problem:   Blood loss anemia Active Problems:   Rheumatoid arthritis (HCC)   Alcohol use disorder   Thrombocytopenia (HCC)   GERD (gastroesophageal reflux disease)   Tobacco abuse   Bipolar 2 disorder (HCC)   Prolonged QT interval   Cardiomegaly   Occult blood positive stool   Acute encephalopathy   Gastric ulcer with hemorrhage   Duodenal ulcer disease   AKI (acute kidney injury) (HCC)   Secondary esophageal varices without bleeding (HCC)   Alcoholic cirrhosis of liver with ascites (HCC)  Addendum: continued issues with hypotension and worsening encephalopathy throughout the day.  Abx broadened to cefepime.  PCCM consulted.  Appreciate assistance.   Supraventricular Tachycardia Wide Complex Tachycardia Currently with SVT, but several runs of wide complex tachycardia noted this morning as well Mag and K normal respectively  Recent echo with EF 60-65% Trial metop IV Will consult cardiology as well  Hypotension Acute Blood Loss Anemia Blood pressure stable CT at presentation with 34 cubic cm subcutaneous hematoma lateral to L proximal femur  S/p 1 unit pRBC S/p 1 unit platelets S/p albumin as well  Continue midodrine  Leukocytosis  Gradually worsening leukocytosis Suspect CXR represents pulm edema.  Will check abdomen again for ascites and plan for para if significant  fluid present. Start ceftriaxone and follow (low threshold for more broad abx - ie with fevers or other si/sx worsening) Follow blood culture if fevers  Acute Kidney Injury  Hepatorenal Syndrome Metabolic Acidosis  Anion Gap Renal US with R sided increased renal parenchymal echogenicity (medical renal disease), no hydro Follow urinalysis (pending) Net positive 7 L, trend daily weights, I/O Indwelling foley in place  Midodrine, octreotide Now appears overloaded with CXR with pulm edema - lasix per renal Suspect AGMA related to her AKI  Appreciate renal assistance    Alcoholic cirrhosis Ascites Thrombocytopenia IgM HBc +, will follow  Meldl-NA 29, 3 mo mortality 56.2 Continue lactulose EGD with grade 2 esopahgeal varices and portal gastropathy, several small shallow gastric ulcers, 3 superficial duodenal ulcers (follow biopsies) -> needs BID ppi x8 weeks, then daily long term Will need nonselective beta blocker for portal hypertension (currently on hold with AKI)  9/15 trace ascites, insufficient for paracentesis (repeat US today) Ceftriaxone as above  Anemia Heme Positive Stool EGD as above Continue PPI BID   Fall on admission CT R elbow with old healed fx of lateral aspect of the radial head and radial head-neck junction, posttraumatic hematoma within the triceps musculature, moderate radiocapitellar and 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 Aundria Rud or Dr. Everardo Pacific    Electrolyte abnormalities noted-calcium corrects with albumin-no correction at this time  PROGRESS NOTE    Erika [name]  UJW:[id number] DOB: 10-22-1956 DOA: 07/31/2023 PCP: Claiborne Rigg, NP  Chief Complaint  Patient presents with   Fall    Brief Narrative:   67 year old with hx RA, chronic pain on lyrica, depression, cirrhosis, etoh use, and multiple other medical issues who presented after Erika [name] fall on 9/13.  Imaging showed L hip hematoma.  She was hypotensive in ED with AKI.    She was admitted in the setting of acute blood loss anemia, heme positive stool in setting of cirrhosis, L hip hematoma, and acute kidney injury.  GI, renal following.  Assessment & Plan:   Principal Problem:   Blood loss anemia Active Problems:   Rheumatoid arthritis (HCC)   Alcohol use disorder   Thrombocytopenia (HCC)   GERD (gastroesophageal reflux disease)   Tobacco abuse   Bipolar 2 disorder (HCC)   Prolonged QT interval   Cardiomegaly   Occult blood positive stool   Acute encephalopathy   Gastric ulcer with hemorrhage   Duodenal ulcer disease   AKI (acute kidney injury) (HCC)   Secondary esophageal varices without bleeding (HCC)   Alcoholic cirrhosis of liver with ascites (HCC)  Addendum: continued issues with hypotension and worsening encephalopathy throughout the day.  Abx broadened to cefepime.  PCCM consulted.  Appreciate assistance.   Supraventricular Tachycardia Wide Complex Tachycardia Currently with SVT, but several runs of wide complex tachycardia noted this morning as well Mag and K normal respectively  Recent echo with EF 60-65% Trial metop IV Will consult cardiology as well  Hypotension Acute Blood Loss Anemia Blood pressure stable CT at presentation with 34 cubic cm subcutaneous hematoma lateral to L proximal femur  S/p 1 unit pRBC S/p 1 unit platelets S/p albumin as well  Continue midodrine  Leukocytosis  Gradually worsening leukocytosis Suspect CXR represents pulm edema.  Will check abdomen again for ascites and plan for para if significant  fluid present. Start ceftriaxone and follow (low threshold for more broad abx - ie with fevers or other si/sx worsening) Follow blood culture if fevers  Acute Kidney Injury  Hepatorenal Syndrome Metabolic Acidosis  Anion Gap Renal US with R sided increased renal parenchymal echogenicity (medical renal disease), no hydro Follow urinalysis (pending) Net positive 7 L, trend daily weights, I/O Indwelling foley in place  Midodrine, octreotide Now appears overloaded with CXR with pulm edema - lasix per renal Suspect AGMA related to her AKI  Appreciate renal assistance    Alcoholic cirrhosis Ascites Thrombocytopenia IgM HBc +, will follow  Meldl-NA 29, 3 mo mortality 56.2 Continue lactulose EGD with grade 2 esopahgeal varices and portal gastropathy, several small shallow gastric ulcers, 3 superficial duodenal ulcers (follow biopsies) -> needs BID ppi x8 weeks, then daily long term Will need nonselective beta blocker for portal hypertension (currently on hold with AKI)  9/15 trace ascites, insufficient for paracentesis (repeat US today) Ceftriaxone as above  Anemia Heme Positive Stool EGD as above Continue PPI BID   Fall on admission CT R elbow with old healed fx of lateral aspect of the radial head and radial head-neck junction, posttraumatic hematoma within the triceps musculature, moderate radiocapitellar and 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 Aundria Rud or Dr. Everardo Pacific    Electrolyte abnormalities noted-calcium corrects with albumin-no correction at this time  PROGRESS NOTE    Erika [name]  [id number] DOB: 10-22-1956 DOA: 07/28/2023 PCP: Claiborne Rigg, NP  Chief Complaint  Patient presents with   Fall    Brief Narrative:   67 year old with hx RA, chronic pain on lyrica, depression, cirrhosis, etoh use, and multiple other medical issues who presented after Erika [name] fall on 9/13.  Imaging showed L hip hematoma.  She was hypotensive in ED with AKI.    She was admitted in the setting of acute blood loss anemia, heme positive stool in setting of cirrhosis, L hip hematoma, and acute kidney injury.  GI, renal following.  Assessment & Plan:   Principal Problem:   Blood loss anemia Active Problems:   Rheumatoid arthritis (HCC)   Alcohol use disorder   Thrombocytopenia (HCC)   GERD (gastroesophageal reflux disease)   Tobacco abuse   Bipolar 2 disorder (HCC)   Prolonged QT interval   Cardiomegaly   Occult blood positive stool   Acute encephalopathy   Gastric ulcer with hemorrhage   Duodenal ulcer disease   AKI (acute kidney injury) (HCC)   Secondary esophageal varices without bleeding (HCC)   Alcoholic cirrhosis of liver with ascites (HCC)  Addendum: continued issues with hypotension and worsening encephalopathy throughout the day.  Abx broadened to cefepime.  PCCM consulted.  Appreciate assistance.   Supraventricular Tachycardia Wide Complex Tachycardia Currently with SVT, but several runs of wide complex tachycardia noted this morning as well Mag and K normal respectively  Recent echo with EF 60-65% Trial metop IV Will consult cardiology as well  Hypotension Acute Blood Loss Anemia Blood pressure stable CT at presentation with 34 cubic cm subcutaneous hematoma lateral to L proximal femur  S/p 1 unit pRBC S/p 1 unit platelets S/p albumin as well  Continue midodrine  Leukocytosis  Gradually worsening leukocytosis Suspect CXR represents pulm edema.  Will check abdomen again for ascites and plan for para if significant  fluid present. Start ceftriaxone and follow (low threshold for more broad abx - ie with fevers or other si/sx worsening) Follow blood culture if fevers  Acute Kidney Injury  Hepatorenal Syndrome Metabolic Acidosis  Anion Gap Renal US with R sided increased renal parenchymal echogenicity (medical renal disease), no hydro Follow urinalysis (pending) Net positive 7 L, trend daily weights, I/O Indwelling foley in place  Midodrine, octreotide Now appears overloaded with CXR with pulm edema - lasix per renal Suspect AGMA related to her AKI  Appreciate renal assistance    Alcoholic cirrhosis Ascites Thrombocytopenia IgM HBc +, will follow  Meldl-NA 29, 3 mo mortality 56.2 Continue lactulose EGD with grade 2 esopahgeal varices and portal gastropathy, several small shallow gastric ulcers, 3 superficial duodenal ulcers (follow biopsies) -> needs BID ppi x8 weeks, then daily long term Will need nonselective beta blocker for portal hypertension (currently on hold with AKI)  9/15 trace ascites, insufficient for paracentesis (repeat US today) Ceftriaxone as above  Anemia Heme Positive Stool EGD as above Continue PPI BID   Fall on admission CT R elbow with old healed fx of lateral aspect of the radial head and radial head-neck junction, posttraumatic hematoma within the triceps musculature, moderate radiocapitellar and 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 Aundria Rud or Dr. Everardo Pacific    Electrolyte abnormalities noted-calcium corrects with albumin-no correction at this time  PROGRESS NOTE    Erika [name]  [id number] DOB: 10-22-1956 DOA: 07/31/2023 PCP: Claiborne Rigg, NP  Chief Complaint  Patient presents with   Fall    Brief Narrative:   67 year old with hx RA, chronic pain on lyrica, depression, cirrhosis, etoh use, and multiple other medical issues who presented after Erika [name] fall on 9/13.  Imaging showed L hip hematoma.  She was hypotensive in ED with AKI.    She was admitted in the setting of acute blood loss anemia, heme positive stool in setting of cirrhosis, L hip hematoma, and acute kidney injury.  GI, renal following.  Assessment & Plan:   Principal Problem:   Blood loss anemia Active Problems:   Rheumatoid arthritis (HCC)   Alcohol use disorder   Thrombocytopenia (HCC)   GERD (gastroesophageal reflux disease)   Tobacco abuse   Bipolar 2 disorder (HCC)   Prolonged QT interval   Cardiomegaly   Occult blood positive stool   Acute encephalopathy   Gastric ulcer with hemorrhage   Duodenal ulcer disease   AKI (acute kidney injury) (HCC)   Secondary esophageal varices without bleeding (HCC)   Alcoholic cirrhosis of liver with ascites (HCC)  Addendum: continued issues with hypotension and worsening encephalopathy throughout the day.  Abx broadened to cefepime.  PCCM consulted.  Appreciate assistance.   Supraventricular Tachycardia Wide Complex Tachycardia Currently with SVT, but several runs of wide complex tachycardia noted this morning as well Mag and K normal respectively  Recent echo with EF 60-65% Trial metop IV Will consult cardiology as well  Hypotension Acute Blood Loss Anemia Blood pressure stable CT at presentation with 34 cubic cm subcutaneous hematoma lateral to L proximal femur  S/p 1 unit pRBC S/p 1 unit platelets S/p albumin as well  Continue midodrine  Leukocytosis  Gradually worsening leukocytosis Suspect CXR represents pulm edema.  Will check abdomen again for ascites and plan for para if significant  fluid present. Start ceftriaxone and follow (low threshold for more broad abx - ie with fevers or other si/sx worsening) Follow blood culture if fevers  Acute Kidney Injury  Hepatorenal Syndrome Metabolic Acidosis  Anion Gap Renal US with R sided increased renal parenchymal echogenicity (medical renal disease), no hydro Follow urinalysis (pending) Net positive 7 L, trend daily weights, I/O Indwelling foley in place  Midodrine, octreotide Now appears overloaded with CXR with pulm edema - lasix per renal Suspect AGMA related to her AKI  Appreciate renal assistance    Alcoholic cirrhosis Ascites Thrombocytopenia IgM HBc +, will follow  Meldl-NA 29, 3 mo mortality 56.2 Continue lactulose EGD with grade 2 esopahgeal varices and portal gastropathy, several small shallow gastric ulcers, 3 superficial duodenal ulcers (follow biopsies) -> needs BID ppi x8 weeks, then daily long term Will need nonselective beta blocker for portal hypertension (currently on hold with AKI)  9/15 trace ascites, insufficient for paracentesis (repeat US today) Ceftriaxone as above  Anemia Heme Positive Stool EGD as above Continue PPI BID   Fall on admission CT R elbow with old healed fx of lateral aspect of the radial head and radial head-neck junction, posttraumatic hematoma within the triceps musculature, moderate radiocapitellar and 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 Aundria Rud or Dr. Everardo Pacific    Electrolyte abnormalities noted-calcium corrects with albumin-no correction at this time

## 2023-07-21 NOTE — Progress Notes (Signed)
Patient transported to via bed and primary RN. Son, called and notified of new room number.

## 2023-07-21 NOTE — Progress Notes (Signed)
Patient is very agitated. Pulling out lines and oxygen. She is 84% on room air. She is following commands briefly with redirection then she is back agitated. Tele sitter ordered.

## 2023-07-22 DIAGNOSIS — K7682 Hepatic encephalopathy: Secondary | ICD-10-CM

## 2023-07-22 DIAGNOSIS — R451 Restlessness and agitation: Secondary | ICD-10-CM

## 2023-07-22 DIAGNOSIS — K766 Portal hypertension: Secondary | ICD-10-CM | POA: Diagnosis not present

## 2023-07-22 DIAGNOSIS — K259 Gastric ulcer, unspecified as acute or chronic, without hemorrhage or perforation: Secondary | ICD-10-CM | POA: Diagnosis not present

## 2023-07-22 DIAGNOSIS — I8510 Secondary esophageal varices without bleeding: Secondary | ICD-10-CM | POA: Diagnosis not present

## 2023-07-22 DIAGNOSIS — N179 Acute kidney failure, unspecified: Secondary | ICD-10-CM

## 2023-07-22 DIAGNOSIS — D696 Thrombocytopenia, unspecified: Secondary | ICD-10-CM | POA: Diagnosis not present

## 2023-07-22 LAB — GLUCOSE, CAPILLARY
Glucose-Capillary: 175 mg/dL — ABNORMAL HIGH (ref 70–99)
Glucose-Capillary: 130 mg/dL — ABNORMAL HIGH (ref 70–99)
Glucose-Capillary: 162 mg/dL — ABNORMAL HIGH (ref 70–99)

## 2023-07-22 LAB — COMPREHENSIVE METABOLIC PANEL
ALT: 15 U/L (ref 0–44)
AST: 49 U/L — ABNORMAL HIGH (ref 15–41)
Albumin: 3.8 g/dL (ref 3.5–5.0)
Alkaline Phosphatase: 63 U/L (ref 38–126)
Anion gap: 19 — ABNORMAL HIGH (ref 5–15)
BUN: 104 mg/dL — ABNORMAL HIGH (ref 8–23)
CO2: 21 mmol/L — ABNORMAL LOW (ref 22–32)
Calcium: 7.9 mg/dL — ABNORMAL LOW (ref 8.9–10.3)
Chloride: 99 mmol/L (ref 98–111)
Creatinine, Ser: 3.53 mg/dL — ABNORMAL HIGH (ref 0.44–1.00)
GFR, Estimated: 14 mL/min — ABNORMAL LOW (ref >60)
Glucose, Bld: 184 mg/dL — ABNORMAL HIGH (ref 70–99)
Potassium: 4.8 mmol/L (ref 3.5–5.1)
Sodium: 139 mmol/L (ref 135–145)
Total Bilirubin: 7.9 mg/dL — ABNORMAL HIGH (ref 0.3–1.2)
Total Protein: 7 g/dL (ref 6.5–8.1)

## 2023-07-22 LAB — CBC WITH DIFFERENTIAL/PLATELET
Abs Immature Granulocytes: 0.18 10*3/uL — ABNORMAL HIGH (ref 0.00–0.07)
Basophils Absolute: 0 10*3/uL (ref 0.0–0.1)
Basophils Relative: 0 %
Eosinophils Absolute: 0 10*3/uL (ref 0.0–0.5)
Eosinophils Relative: 0 %
HCT: 22.7 % — ABNORMAL LOW (ref 36.0–46.0)
Hemoglobin: 7.3 g/dL — ABNORMAL LOW (ref 12.0–15.0)
Immature Granulocytes: 2 %
Lymphocytes Relative: 12 %
Lymphs Abs: 1.5 10*3/uL (ref 0.7–4.0)
MCH: 30.9 pg (ref 26.0–34.0)
MCHC: 32.2 g/dL (ref 30.0–36.0)
MCV: 96.2 fL (ref 80.0–100.0)
Monocytes Absolute: 0.5 10*3/uL (ref 0.1–1.0)
Monocytes Relative: 4 %
Neutro Abs: 10 10*3/uL — ABNORMAL HIGH (ref 1.7–7.7)
Neutrophils Relative %: 82 %
Platelets: 51 10*3/uL — ABNORMAL LOW (ref 150–400)
RBC: 2.36 MIL/uL — ABNORMAL LOW (ref 3.87–5.11)
RDW: 15.2 % (ref 11.5–15.5)
WBC: 12.2 10*3/uL — ABNORMAL HIGH (ref 4.0–10.5)
nRBC: 0 % (ref 0.0–0.2)

## 2023-07-22 LAB — LACTIC ACID, PLASMA
Lactic Acid, Venous: 2.5 mmol/L (ref 0.5–1.9)
Lactic Acid, Venous: 2.5 mmol/L (ref 0.5–1.9)

## 2023-07-22 LAB — VITAMIN B1: Vitamin B1 (Thiamine): 61.4 nmol/L — ABNORMAL LOW (ref 66.5–200.0)

## 2023-07-22 LAB — MRSA NEXT GEN BY PCR, NASAL: MRSA by PCR Next Gen: NOT DETECTED

## 2023-07-22 MED ORDER — ADULT MULTIVITAMIN W/MINERALS CH: 1.0000 | ORAL_TABLET | Freq: Every day | ORAL | Status: DC

## 2023-07-22 MED ORDER — RIFAXIMIN 550 MG PO TABS: 550.0000 mg | ORAL_TABLET | Freq: Two times a day (BID) | ORAL | Status: DC

## 2023-07-22 MED ORDER — FUROSEMIDE 10 MG/ML IJ SOLN
80.0000 mg | Freq: Two times a day (BID) | INTRAMUSCULAR | Status: AC
  Administered 2023-07-22 – 2023-07-23 (×2): 80 mg via INTRAVENOUS
  Filled 2023-07-22 (×2): qty 8

## 2023-07-22 MED ORDER — HYDROMORPHONE HCL 1 MG/ML IJ SOLN
0.5000 mg | INTRAMUSCULAR | Status: DC | PRN
  Administered 2023-07-22 – 2023-07-29 (×20): 0.5 mg via INTRAVENOUS
  Filled 2023-07-22 (×20): qty 0.5

## 2023-07-22 NOTE — Progress Notes (Signed)
Updated patient's daughter and granddaughter at bedside.  The patient lives at home with her son.  She is mostly sedentary, does not I leave the house, watches TV and drinks several drinks in the afternoon.  Per the patient's daughter there is nothing wrong with her until a couple of weeks ago when she finally saw a doctor and was supposed to have some labs done to have a workup for cirrhosis.  About 2 weeks ago she started drinking.  She does continue to smoke least a pack a day.  The patient's daughter said yesterday she was doing fine at the bedside and she is instructed to see her in the ICU and still altered.  I reviewed that the patient has end-stage liver disease and based on her MELD score has an overall unfavorable prognosis.  She is now entering renal failure as well and would not be a good candidate for dialysis.  This is all very shocking to the patient's daughter.  I discussed the idea of having a family meeting so that we can go over her overall prognosis and next up for goals of care.  I verified a full CODE STATUS for now.  I think we should have a family meeting tomorrow with medical team as well as palliative care to review prognosis and neck step.  Patient's family will let us know on when they can arrange that.  Additional critical care time 30 minutes  Durel Salts, MD Pulmonary and Critical Care Medicine [facility] 07/22/2023 4:37 PM Pager: see AMION  If no response to pager, please call critical care on call (see AMION) until 7pm After 7:00 pm call Elink

## 2023-07-22 NOTE — Progress Notes (Signed)
Unable to call daughter Dorris Carnes back at an appropriate time At [phone].  I had already talked to son who is the only contact listed.  Will have first shift check with som tomorrow.

## 2023-07-22 NOTE — Progress Notes (Addendum)
NAME:  Erika [name], MRN:  [id number], DOB:  05/17/1956, LOS: 6 ADMISSION DATE:  07/30/2023, CONSULTATION DATE:  07/21/23 REFERRING MD:  Grier Mitts., MD CHIEF COMPLAINT:  Hypotension/AMS   History of Present Illness:  Erika [name] is a 67 y.o. female with a hx of rheumatoid arthritis, cirrhosis secondary to alcohol abuse, bipolar disorder, history of portal hypertension, GERD, HTN, chronic pain on lyrica who was admitted 9/14 for swelling of her left hip after suffering a fall the week before. She was admitted for acute blood loss anemia due to left hip hematoma and heme positive stool.   Today she developed runs of SVT treated with beta blockers. She developed hypotension and altered mental status with bradycardia this afternoon in which PCCM was consulted.   Nephrology is following for AKI in setting of hepato renal syndrome. Gi has been following for cirrhosis and esophageal varices and portal hypertension. She has been on lactulose and rifaximin for hepatic encephalopathy.  Pertinent  Medical History   Past Medical History:  Diagnosis Date   Arthritis    Cirrhosis (HCC)    Depression    GERD (gastroesophageal reflux disease)    History of shingles 10/18/2018   Hypertension    Rheumatoid arthritis(714.0)    Significant Hospital Events: Including procedures, antibiotic start and stop dates in addition to other pertinent events   9/14 admitted 9/19 Altered mental status, hypotension and bradycardia, transferred to ICU. Unable to obtain fluid for paracentesis  Interim History / Subjective:  + 449 ml I/O Remains on levophed at 4 mcg/min Encephalopathic but maintaining appropriate sats and protecting airway  Objective   Blood pressure (!) 100/58, pulse (!) 50, temperature (!) 96.4 F (35.8 C), temperature source Axillary, resp. rate 15, height 5\' 1"  (1.549 m), weight 69.8 kg, SpO2 100%.        Intake/Output Summary (Last 24 hours) at 07/22/2023 0731 Last data  filed at 07/22/2023 0700 Gross per 24 hour  Intake 1149.3 ml  Output 700 ml  Net 449.3 ml   Filed Weights   07/24/2023 1328 07/22/23 0500  Weight: 65.8 kg 69.8 kg    Examination: General: Chronically ill appearing woman, encephalopathic HENT: [place]/AT, sclera anicteric Lungs: scattered rales, no wheezing Cardiovascular: sinus bradycardia no murmurs Abdomen: soft, mildly distended, BS+, non-tender Extremities: warm, no edema. Eccymoses left leg Neuro: not alert, PERRL GU: Foley   Resolved Hospital Problem list     Assessment & Plan:   Septic shock  leukocytosis, altered mental status and hypotension with unclear source at this time possible SBP given ascites vs pneumonia given airspace opacities on CXR -Continue Levophed with map goal 65 or greater -Unable to tolerate PO midodrine  -9/19: Blood Cx NGTD -9/19 bedside ultrasound with no pocket of ascitic fluid to obtain for diagnostics -9/19 Right arm wound culture pending -Continue cefepime and linezolid empiric coverage  Alcoholic Cirrhosis Ascitis Portal Hypertension Esophageal Varices Metabolic encephalopathy/hepatic encephalopathy - GI following -s/pEGD on 9/16 with no overt bleeding - Continue lactulose enemas given inability to take PO, Rifaxamin BID as able - hold on placing NG tube given varices seen on admission - Albumin 50g q6 hours x 4 doses, last dose at 0800  Acute respiratory insufficiency - Appears volume overloaded, may benefit from diuresis - Receiving antibiotics coverage for possible pneumonia  AKI Hepatorenal syndrome Anion Gap Metabolic Acidosis - maintain map goal of 65 or higher - Octreotide, unable to tolerate PO midodrine - Nephrology following  Anemia Thrombocytopenia - trend CBC,  NAME:  Erika [name], MRN:  [id number], DOB:  05/17/1956, LOS: 6 ADMISSION DATE:  07/30/2023, CONSULTATION DATE:  07/21/23 REFERRING MD:  Grier Mitts., MD CHIEF COMPLAINT:  Hypotension/AMS   History of Present Illness:  Erika [name] is a 67 y.o. female with a hx of rheumatoid arthritis, cirrhosis secondary to alcohol abuse, bipolar disorder, history of portal hypertension, GERD, HTN, chronic pain on lyrica who was admitted 9/14 for swelling of her left hip after suffering a fall the week before. She was admitted for acute blood loss anemia due to left hip hematoma and heme positive stool.   Today she developed runs of SVT treated with beta blockers. She developed hypotension and altered mental status with bradycardia this afternoon in which PCCM was consulted.   Nephrology is following for AKI in setting of hepato renal syndrome. Gi has been following for cirrhosis and esophageal varices and portal hypertension. She has been on lactulose and rifaximin for hepatic encephalopathy.  Pertinent  Medical History   Past Medical History:  Diagnosis Date   Arthritis    Cirrhosis (HCC)    Depression    GERD (gastroesophageal reflux disease)    History of shingles 10/18/2018   Hypertension    Rheumatoid arthritis(714.0)    Significant Hospital Events: Including procedures, antibiotic start and stop dates in addition to other pertinent events   9/14 admitted 9/19 Altered mental status, hypotension and bradycardia, transferred to ICU. Unable to obtain fluid for paracentesis  Interim History / Subjective:  + 449 ml I/O Remains on levophed at 4 mcg/min Encephalopathic but maintaining appropriate sats and protecting airway  Objective   Blood pressure (!) 100/58, pulse (!) 50, temperature (!) 96.4 F (35.8 C), temperature source Axillary, resp. rate 15, height 5\' 1"  (1.549 m), weight 69.8 kg, SpO2 100%.        Intake/Output Summary (Last 24 hours) at 07/22/2023 0731 Last data  filed at 07/22/2023 0700 Gross per 24 hour  Intake 1149.3 ml  Output 700 ml  Net 449.3 ml   Filed Weights   07/24/2023 1328 07/22/23 0500  Weight: 65.8 kg 69.8 kg    Examination: General: Chronically ill appearing woman, encephalopathic HENT: [place]/AT, sclera anicteric Lungs: scattered rales, no wheezing Cardiovascular: sinus bradycardia no murmurs Abdomen: soft, mildly distended, BS+, non-tender Extremities: warm, no edema. Eccymoses left leg Neuro: not alert, PERRL GU: Foley   Resolved Hospital Problem list     Assessment & Plan:   Septic shock  leukocytosis, altered mental status and hypotension with unclear source at this time possible SBP given ascites vs pneumonia given airspace opacities on CXR -Continue Levophed with map goal 65 or greater -Unable to tolerate PO midodrine  -9/19: Blood Cx NGTD -9/19 bedside ultrasound with no pocket of ascitic fluid to obtain for diagnostics -9/19 Right arm wound culture pending -Continue cefepime and linezolid empiric coverage  Alcoholic Cirrhosis Ascitis Portal Hypertension Esophageal Varices Metabolic encephalopathy/hepatic encephalopathy - GI following -s/pEGD on 9/16 with no overt bleeding - Continue lactulose enemas given inability to take PO, Rifaxamin BID as able - hold on placing NG tube given varices seen on admission - Albumin 50g q6 hours x 4 doses, last dose at 0800  Acute respiratory insufficiency - Appears volume overloaded, may benefit from diuresis - Receiving antibiotics coverage for possible pneumonia  AKI Hepatorenal syndrome Anion Gap Metabolic Acidosis - maintain map goal of 65 or higher - Octreotide, unable to tolerate PO midodrine - Nephrology following  Anemia Thrombocytopenia - trend CBC,  NAME:  Erika [name], MRN:  [id number], DOB:  05/17/1956, LOS: 6 ADMISSION DATE:  07/09/2023, CONSULTATION DATE:  07/21/23 REFERRING MD:  Grier Mitts., MD CHIEF COMPLAINT:  Hypotension/AMS   History of Present Illness:  Erika [name] is a 67 y.o. female with a hx of rheumatoid arthritis, cirrhosis secondary to alcohol abuse, bipolar disorder, history of portal hypertension, GERD, HTN, chronic pain on lyrica who was admitted 9/14 for swelling of her left hip after suffering a fall the week before. She was admitted for acute blood loss anemia due to left hip hematoma and heme positive stool.   Today she developed runs of SVT treated with beta blockers. She developed hypotension and altered mental status with bradycardia this afternoon in which PCCM was consulted.   Nephrology is following for AKI in setting of hepato renal syndrome. Gi has been following for cirrhosis and esophageal varices and portal hypertension. She has been on lactulose and rifaximin for hepatic encephalopathy.  Pertinent  Medical History   Past Medical History:  Diagnosis Date   Arthritis    Cirrhosis (HCC)    Depression    GERD (gastroesophageal reflux disease)    History of shingles 10/18/2018   Hypertension    Rheumatoid arthritis(714.0)    Significant Hospital Events: Including procedures, antibiotic start and stop dates in addition to other pertinent events   9/14 admitted 9/19 Altered mental status, hypotension and bradycardia, transferred to ICU. Unable to obtain fluid for paracentesis  Interim History / Subjective:  + 449 ml I/O Remains on levophed at 4 mcg/min Encephalopathic but maintaining appropriate sats and protecting airway  Objective   Blood pressure (!) 100/58, pulse (!) 50, temperature (!) 96.4 F (35.8 C), temperature source Axillary, resp. rate 15, height 5\' 1"  (1.549 m), weight 69.8 kg, SpO2 100%.        Intake/Output Summary (Last 24 hours) at 07/22/2023 0731 Last data  filed at 07/22/2023 0700 Gross per 24 hour  Intake 1149.3 ml  Output 700 ml  Net 449.3 ml   Filed Weights   07/27/2023 1328 07/22/23 0500  Weight: 65.8 kg 69.8 kg    Examination: General: Chronically ill appearing woman, encephalopathic HENT: [place]/AT, sclera anicteric Lungs: scattered rales, no wheezing Cardiovascular: sinus bradycardia no murmurs Abdomen: soft, mildly distended, BS+, non-tender Extremities: warm, no edema. Eccymoses left leg Neuro: not alert, PERRL GU: Foley   Resolved Hospital Problem list     Assessment & Plan:   Septic shock  leukocytosis, altered mental status and hypotension with unclear source at this time possible SBP given ascites vs pneumonia given airspace opacities on CXR -Continue Levophed with map goal 65 or greater -Unable to tolerate PO midodrine  -9/19: Blood Cx NGTD -9/19 bedside ultrasound with no pocket of ascitic fluid to obtain for diagnostics -9/19 Right arm wound culture pending -Continue cefepime and linezolid empiric coverage  Alcoholic Cirrhosis Ascitis Portal Hypertension Esophageal Varices Metabolic encephalopathy/hepatic encephalopathy - GI following -s/pEGD on 9/16 with no overt bleeding - Continue lactulose enemas given inability to take PO, Rifaxamin BID as able - hold on placing NG tube given varices seen on admission - Albumin 50g q6 hours x 4 doses, last dose at 0800  Acute respiratory insufficiency - Appears volume overloaded, may benefit from diuresis - Receiving antibiotics coverage for possible pneumonia  AKI Hepatorenal syndrome Anion Gap Metabolic Acidosis - maintain map goal of 65 or higher - Octreotide, unable to tolerate PO midodrine - Nephrology following  Anemia Thrombocytopenia - trend CBC,  NAME:  Erika [name], MRN:  [id number], DOB:  05/17/1956, LOS: 6 ADMISSION DATE:  07/30/2023, CONSULTATION DATE:  07/21/23 REFERRING MD:  Grier Mitts., MD CHIEF COMPLAINT:  Hypotension/AMS   History of Present Illness:  Erika [name] is a 67 y.o. female with a hx of rheumatoid arthritis, cirrhosis secondary to alcohol abuse, bipolar disorder, history of portal hypertension, GERD, HTN, chronic pain on lyrica who was admitted 9/14 for swelling of her left hip after suffering a fall the week before. She was admitted for acute blood loss anemia due to left hip hematoma and heme positive stool.   Today she developed runs of SVT treated with beta blockers. She developed hypotension and altered mental status with bradycardia this afternoon in which PCCM was consulted.   Nephrology is following for AKI in setting of hepato renal syndrome. Gi has been following for cirrhosis and esophageal varices and portal hypertension. She has been on lactulose and rifaximin for hepatic encephalopathy.  Pertinent  Medical History   Past Medical History:  Diagnosis Date   Arthritis    Cirrhosis (HCC)    Depression    GERD (gastroesophageal reflux disease)    History of shingles 10/18/2018   Hypertension    Rheumatoid arthritis(714.0)    Significant Hospital Events: Including procedures, antibiotic start and stop dates in addition to other pertinent events   9/14 admitted 9/19 Altered mental status, hypotension and bradycardia, transferred to ICU. Unable to obtain fluid for paracentesis  Interim History / Subjective:  + 449 ml I/O Remains on levophed at 4 mcg/min Encephalopathic but maintaining appropriate sats and protecting airway  Objective   Blood pressure (!) 100/58, pulse (!) 50, temperature (!) 96.4 F (35.8 C), temperature source Axillary, resp. rate 15, height 5\' 1"  (1.549 m), weight 69.8 kg, SpO2 100%.        Intake/Output Summary (Last 24 hours) at 07/22/2023 0731 Last data  filed at 07/22/2023 0700 Gross per 24 hour  Intake 1149.3 ml  Output 700 ml  Net 449.3 ml   Filed Weights   07/24/2023 1328 07/22/23 0500  Weight: 65.8 kg 69.8 kg    Examination: General: Chronically ill appearing woman, encephalopathic HENT: [place]/AT, sclera anicteric Lungs: scattered rales, no wheezing Cardiovascular: sinus bradycardia no murmurs Abdomen: soft, mildly distended, BS+, non-tender Extremities: warm, no edema. Eccymoses left leg Neuro: not alert, PERRL GU: Foley   Resolved Hospital Problem list     Assessment & Plan:   Septic shock  leukocytosis, altered mental status and hypotension with unclear source at this time possible SBP given ascites vs pneumonia given airspace opacities on CXR -Continue Levophed with map goal 65 or greater -Unable to tolerate PO midodrine  -9/19: Blood Cx NGTD -9/19 bedside ultrasound with no pocket of ascitic fluid to obtain for diagnostics -9/19 Right arm wound culture pending -Continue cefepime and linezolid empiric coverage  Alcoholic Cirrhosis Ascitis Portal Hypertension Esophageal Varices Metabolic encephalopathy/hepatic encephalopathy - GI following -s/pEGD on 9/16 with no overt bleeding - Continue lactulose enemas given inability to take PO, Rifaxamin BID as able - hold on placing NG tube given varices seen on admission - Albumin 50g q6 hours x 4 doses, last dose at 0800  Acute respiratory insufficiency - Appears volume overloaded, may benefit from diuresis - Receiving antibiotics coverage for possible pneumonia  AKI Hepatorenal syndrome Anion Gap Metabolic Acidosis - maintain map goal of 65 or higher - Octreotide, unable to tolerate PO midodrine - Nephrology following  Anemia Thrombocytopenia - trend CBC,

## 2023-07-22 NOTE — TOC Progression Note (Signed)
Transition of Care [facility]) - Progression Note    Patient Details  Name: Erika [name] MRN: [id number] Date of Birth: [birth year]  Transition of Care [facility]) CM/SW Contact  Tom-Johnson, Hershal Coria, RN Phone Number: 07/22/2023, 1:24 PM  Clinical Narrative:     Patient transferred from 3W to ICU yesterday for persistent Hypotension and Altered Mental Status. Admitted under PCCM Service with Blood Loss Anemia. Hgb today at 7.3. GI following.  PT/OT recommended SNF, patient and son declined, requested Home Health, info on AVS.  Patient not Medically ready for discharge.  CM will continue to follow as patient progresses with care towards discharge.       Expected Discharge Plan: Home w Home Health Services Barriers to Discharge: Continued Medical Work up  Expected Discharge Plan and Services   Discharge Planning Services: CM Consult Post Acute Care Choice: Home Health Living arrangements for the past 2 months: Single Family Home                 DME Arranged: Bedside commode, Walker rolling         HH Arranged: PT, OT, Nurse's Aide HH Agency: [facility] Date [facility] Agency Contacted: 07/19/23   Representative spoke with at [facility] Agency: Kandee Keen   Social Determinants of Health (SDOH) Interventions SDOH Screenings   Food Insecurity: No Food Insecurity (07/08/2023)  Housing: Low Risk  (07/27/2023)  Transportation Needs: No Transportation Needs (07/24/2023)  Utilities: Not At Risk (07/30/2023)  Depression (PHQ2-9): High Risk (07/08/2023)  Financial Resource Strain: Not on File (07/23/2022)   Received from [place], [place]  Physical Activity: Not on File (07/23/2022)   Received from [place], [place]  Social Connections: Not on File (07/10/2023)   Received from [facility]  Stress: Not on File (07/23/2022)   Received from [place], [place]  Tobacco Use: High Risk (07/06/2023)    Readmission Risk Interventions     No data to display

## 2023-07-22 NOTE — Progress Notes (Signed)
Patient has been extremely agitated and confused. Unable to follow commands or be redirected. She is removing her oxygen tubing and she is 84% on room air, she has removed her only IV access, she has removed her tele monitoring equipment. Less restrictive interventions unsuccessful. Tele sitter was trial unsuccessful. All high risk fall interventions in place. Spoke with Dr. Lowell Guitar and placed patient in bilateral soft wrist restraints. I spoke with son, [name] who is currently at work. He reports he wants to try to stay with her as much as possible - he is working on [employer] paperwork.

## 2023-07-22 NOTE — Progress Notes (Signed)
   07/21/23 0959  Assess: MEWS Score  Temp 98.2 F (36.8 C)  BP (!) 138/93  MAP (mmHg) 107  Pulse Rate (!) 155  Resp 20  Level of Consciousness Alert  SpO2 96 %  O2 Device Nasal Cannula  O2 Flow Rate (L/min) 2 L/min  Assess: MEWS Score  MEWS Temp 0  MEWS Systolic 0  MEWS Pulse 3  MEWS RR 0  MEWS LOC 0  MEWS Score 3  MEWS Score Color Yellow  Assess: if the MEWS score is Yellow or Red  Were vital signs accurate and taken at a resting state? Yes  Does the patient meet 2 or more of the SIRS criteria? No  MEWS guidelines implemented  Yes, yellow  Treat  MEWS Interventions Considered administering scheduled or prn medications/treatments as ordered  Take Vital Signs  Increase Vital Sign Frequency  Yellow: Q2hr x1, continue Q4hrs until patient remains green for 12hrs  Escalate  MEWS: Escalate Yellow: Discuss with charge nurse and consider notifying provider and/or RRT  Notify: Charge Nurse/RN  Name of Charge Nurse/RN Notified TY, RN  Provider Notification  Provider Name/Title Dr. Lowell Guitar  Date Provider Notified 07/21/23  Time Provider Notified 1000  Method of Notification Page  Notification Reason Other (Comment) (rapid response)  Provider response En route  Date of Provider Response 07/21/23  Time of Provider Response 1000  Notify: Rapid Response  Name of Rapid Response RN Notified Nikki  Date Rapid Response Notified 07/21/23  Time Rapid Response Notified 1000  Assess: SIRS CRITERIA  SIRS Temperature  0  SIRS Pulse 1  SIRS Respirations  0  SIRS WBC 1  SIRS Score Sum  2

## 2023-07-22 NOTE — Progress Notes (Addendum)
[facility] ROUNDING NOTE   Subjective: Patient was transferred to ICU yesterday evening because of persistent hypotension and altered mental status.  She was apparently extremely agitated and screaming, and briefly required Precedex sedation.  Per nursing, she was given some Dilaudid and this is improved her agitation, although she remains sedated.  She has been maintained on Levophed to maintain maps.  She was noted to have pus coming from her right upper extremity wound.  Gram stain with gram-positive cocci, cultures negative thus far.  Blood cultures negative.  CRP significantly elevated at 23, up from 4 on September 9. Her hemoglobin dropped 1 point, and her BUN increased significantly but she has not had any evidence of GI bleeding.  She had brown stool in her fecal management system. She was just given her first lactulose enema this morning when I saw her.   Objective: Vital signs in last 24 hours: Temp:  [96 F (35.6 C)-98.2 F (36.8 C)] 97.5 F (36.4 C) (09/20 0736) Pulse Rate:  [46-58] 47 (09/20 0800) Resp:  [8-23] 16 (09/20 0800) BP: (41-120)/(16-85) 109/56 (09/20 0800) SpO2:  [87 %-100 %] 100 % (09/20 0800) Weight:  [69.8 kg] 69.8 kg (09/20 0500) Last BM Date : 07/21/23 General: Ill appearing Caucasian female, somnolent but awake, not interactive.  Does not respond to questions Lungs: Bilateral rales, no wheezing Heart: Bradycardic, no m/r/g Abdomen:  Soft, NT, mildly distended, +BS Ext: Right upper extremity wrapped in bandage, not removed, extensive ecchymoses/hematoma along posterior left lower extremity, buttocks/pelvis    Intake/Output from previous day: 09/19 0701 - 09/20 0700 In: 1149.3 [I.V.:281.1; IV Piggyback:808.2] Out: 700 [Urine:600; Stool:100] Intake/Output this shift: Total I/O In: 129 [I.V.:49.9; IV Piggyback:79] Out: 150 [Urine:150]   Lab Results: Recent Labs    07/21/23 0828 07/21/23 1914 07/22/23 0252  WBC 13.1* 13.2* 12.2*   HGB 8.4* 7.6* 7.3*  PLT 40* 55* 51*  MCV 93.1 93.9 96.2   BMET Recent Labs    07/20/23 0901 07/21/23 0828 07/22/23 0252  NA 136 138 139  K 4.7 5.1 4.8  CL 99 102 99  CO2 20* 20* 21*  GLUCOSE 122* 151* 184*  BUN 83* 95* 104*  CREATININE 3.21* 3.28* 3.53*  CALCIUM 7.2* 7.6* 7.9*   LFT Recent Labs    07/20/23 0901 07/21/23 0828 07/22/23 0252  PROT  --  6.1* 7.0  ALBUMIN 2.2* 2.0* 3.8  AST  --  47* 49*  ALT  --  16 15  ALKPHOS  --  77 63  BILITOT  --  7.4* 7.9*   PT/INR No results for input(s): "INR" in the last 72 hours.    Imaging/Other results: [place] ASCITES (ABDOMEN LIMITED)  Result Date: 07/21/2023 CLINICAL DATA:  History of cirrhosis EXAM: LIMITED ABDOMEN ULTRASOUND FOR ASCITES TECHNIQUE: Limited ultrasound survey for ascites was performed in all four abdominal quadrants. COMPARISON:  Abdominal ultrasound 07/17/2023 FINDINGS AND IMPRESSION: Small volume ascites in all 4 quadrants is similar to the prior study from 07/17/2023. Electronically Signed   By: Lesia Hausen M.D.   On: 07/21/2023 13:14   DG CHEST PORT 1 VIEW  Result Date: 07/21/2023 CLINICAL DATA:  Shortness of breath EXAM: PORTABLE CHEST 1 VIEW COMPARISON:  Chest radiograph 07/20/2023 FINDINGS: The heart is enlarged, unchanged. The upper mediastinal contours are normal Airspace opacity throughout both lungs has worsened in the interim likely reflecting worsened pulmonary edema. There is no significant pleural effusion. There is no pneumothorax There is no acute osseous abnormality. IMPRESSION: Worsened pulmonary edema.  Electronically Signed   By: Lesia Hausen M.D.   On: 07/21/2023 13:12      Assessment and Plan:  67 year old female with decompensated alcohol cirrhosis, admitted following fall at home, with acute kidney injury, anemia and dark stool. She underwent an EGD September 16 notable for grade 2 esophageal varices and portal hypertensive gastropathy as well as numerous gastric and duodenal ulcers,  attributed to frequent NSAID use. Her renal function did not improve with volume resuscitation, making hepatorenal syndrome her most likely diagnosis.  She experienced acute clinical deterioration yesterday manifested by hypotension, SVT with wide-complex tachycardia followed by significant bradycardia following admission of metoprolol, and significant change of mental status characterized by agitation and confusion.    Etiology for acute decline unclear, but suspect infection, with right upper extremity wound possible source.  Lower suspicion for GI bleed given presence of brown stool and modest drop in hemoglobin relative to her clinical deterioration.   Decompensated cirrhosis with esophageal varices, portal hypertensive gastropathy, hepatic encephalopathy, hepatorenal syndrome, primarily secondary to chronic alcohol, but may have underlying autoimmune hepatitis (positive ASMA) as well -No role for initiating therapy for autoimmune hepatitis in the setting of decompensated cirrhosis with hepatorenal syndrome and now likely infection   Hepatic encephalopathy/agitation, likely multifactorial - Agree with lactulose enemas for now, NG tube not necessarily contraindicated because of her large varices and thrombocytopenia, but should certainly be used with caution - Rifaximin 550 twice daily if able to take p.o. 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 -Low suspicion for bleeding varices currently, but would add back octreotide drip if patient demonstrates any evidence of overt bleeding -No plans for upper endoscopy today - Please contact GI if there is evidence of overt bleeding    Hepatorenal syndrome - Defer to nephrology, but consider resuming octreotide drip in setting of hypotension/questionable sepsis -  Continue albumin for volume support   Peptic ulcer disease secondary to NSAIDs -Continue twice daily PPI x 8 weeks - Avoid all NSAIDs indefinitely   New hypotension with SVT, wide-complex tachycardia and volume overload, now with appropriate bradycardia/hypotension.  Suspicious for sepsis with left upper extremity wound as source - Defer resuscitation/work up to critical care team -On linezolid, cefepime -Very unlikely to be SBP given scant ascites, unable to find adequate pocket  Patient's prognosis is extremely grim at this point with decompensated cirrhosis, HRS and likely sepsis.   Jenel Lucks, MD  07/22/2023, 10:20 AM [facility]

## 2023-07-22 NOTE — Progress Notes (Signed)
Physical Therapy Treatment Patient Details Name: Erika [name] MRN: [id number] DOB: 10/17/1956 Today's Date: 07/22/2023   History of Present Illness 67 y.o. female presenting 08-11-2023 after ground level fall with head strike. 9/19 Altered mental status, hypotension and bradycardia, transferred to ICU.  PMH significant for rheumatoid arthritis, cirrhosis secondary to alcohol abuse, bipolar disorder, history of portal hypertension, history of from a cytopenia, GERD, hypertension.    PT Comments  Pt had a decline in funcitonal mobility since her initial evaluation. Currently pt is bed ridden and unable to tolerate functional mobility. Pt sat in chair position in bed and was repositioned for comfort. P/ROM performed of the L ankle and knee. Pt assisted with adduction/abduction of the L hip for AA/ROM. Pt was unable to open eyes for extended periods of time throughout session. Due to pt current functional status, home set up and available assistance at home continuing to recommend skilled physical therapy services < 3 hours/day on discharge from acute care hospital setting. If pt continues at current level of function in the next week may discharge and wait for pt to be more appropriate for skilled physical therapy services.     If plan is discharge home, recommend the following: A lot of help with walking and/or transfers;A lot of help with bathing/dressing/bathroom;Assistance with cooking/housework;Help with stairs or ramp for entrance   Can travel by private vehicle     No  Equipment Recommendations  Other (comment) (to be determined)       Precautions / Restrictions Precautions Precautions: Fall Precaution Comments: R UE splint per chart but only gauce dressing donned and sling in room but not donned this sesssion. No weight bearing limitations documented, no fracture. Just swelling. Required Braces or Orthoses: Splint/Cast Splint/Cast - Date Prophylactic Dressing Applied (if applicable):  07/19/23 Restrictions Weight Bearing Restrictions: Yes LLE Weight Bearing: Non weight bearing     Mobility  Bed Mobility     General bed mobility comments: total A to reposition in bed and for chair position in bed; no protective response noted with pt slowly falling toward L side with wedge removed from side. Not appropriate to sit EOB this date          Balance   Sitting-balance support: Feet unsupported          Cognition Arousal: Suspect due to medications, Stuporous   Overall Cognitive Status: No family/caregiver present to determine baseline cognitive functioning     General Comments: Max stim to arouse only to painful stimuli this session. Eyes open 3-4x with pt making very brief eye contact again only after pain with repositioning. not following any commands. RN suspecting low level of arousal from last pain medication           General Comments General comments (skin integrity, edema, etc.): 114/60 supine on arrival; 110/54 chair position in bed, 106/51 after 5 min. Returned to supine at end of session.      Pertinent Vitals/Pain Pain Assessment Pain Assessment: Faces Faces Pain Scale: Hurts even more Breathing: normal Negative Vocalization: occasional moan/groan, low speech, negative/disapproving quality Facial Expression: sad, frightened, frown Body Language: tense, distressed pacing, fidgeting Consolability: no need to console PAINAD Score: 3 Pain Location: R elbow, left hip Pain Descriptors / Indicators: Grimacing, Guarding, Discomfort, Sore Pain Intervention(s): Monitored during session, Repositioned     PT Goals (current goals can now be found in the care plan section) Acute Rehab PT Goals Patient Stated Goal: Less pain with getting up and moving Time For Goal  Achievement: 07/31/23 Potential to Achieve Goals: Fair Progress towards PT goals: Not progressing toward goals - comment (pt in ICU)    Frequency    Min 1X/week      PT Plan   Currently continue with current POC    Co-evaluation PT/OT/SLP Co-Evaluation/Treatment: Yes Reason for Co-Treatment: For patient/therapist safety PT goals addressed during session: Mobility/safety with mobility OT goals addressed during session: ADL's and self-care      AM-PAC PT "6 Clicks" Mobility   Outcome Measure  Help needed turning from your back to your side while in a flat bed without using bedrails?: Total Help needed moving from lying on your back to sitting on the side of a flat bed without using bedrails?: Total Help needed moving to and from a bed to a chair (including a wheelchair)?: Total Help needed standing up from a chair using your arms (e.g., wheelchair or bedside chair)?: Total Help needed to walk in hospital room?: Total Help needed climbing 3-5 steps with a railing? : Total 6 Click Score: 6    End of Session   Activity Tolerance: Treatment limited secondary to agitation;Treatment limited secondary to medical complications (Comment) (pt stuporous) Patient left: in bed;with call bell/phone within reach;with bed alarm set Nurse Communication: Mobility status PT Visit Diagnosis: Unsteadiness on feet (R26.81);Muscle weakness (generalized) (M62.81);Other abnormalities of gait and mobility (R26.89);Difficulty in walking, not elsewhere classified (R26.2);Pain;History of falling (Z91.81) Pain - part of body: Leg;Arm     Time: 1142-1208 PT Time Calculation (min) (ACUTE ONLY): 26 min  Charges:    $Therapeutic Activity: 8-22 mins PT General Charges $$ ACUTE PT VISIT: 1 Visit                    Harrel Carina, DPT, CLT  Acute Rehabilitation Services Office: [phone] (Secure chat preferred)    Claudia Desanctis 07/22/2023, 12:54 PM

## 2023-07-22 NOTE — Progress Notes (Signed)
Patient remains in ICU under PCCM service. Will sign off at this time.  Lacretia Nicks, MD [facility]

## 2023-07-22 NOTE — Progress Notes (Signed)
     Referral received for Erika [name]: goals of care discussion. Chart reviewed. Patient assessed and is unable to engage appropriately in discussions.  I was able to speak with the patient's son Shria Booz. He will let me know tomorrow whether he can meet Saturday or Sunday for a GOC meeting.  Detailed note and recommendations to follow once GOC has been completed.   Thank you for your referral and allowing PMT to assist in [name]'s care.   Sarina Ser, NP Palliative Medicine Team  Team Phone # [phone]   NO CHARGE

## 2023-07-22 NOTE — Progress Notes (Signed)
Erika Erika [name] Progress Note    Assessment/ Plan:   AKI  -baseline Cr normal -suspecting this is related to HRS, cocaine. Relatively bland urine sediment, no obstruction on imaging. Has been adequately volume resuscitated since she's been admitted (now hypervolemic) -Cr up to 3.5 today -c/w octreotide and levophed, stopped midodrine given bradycardia. If her kidney function/mental status does not improve by tomorrow then will need to decide on CRRT. My biggest concern is that if she is not a liver transplant candidate then would likely not do well with renal replacement therapy. I have shared this concern with her son over the phone today. I would recommend getting palliative care in to see her sooner rather than later to help determine GOC, will consult -Avoid nephrotoxic medications including NSAIDs and iodinated intravenous contrast exposure unless the latter is absolutely indicated.  Preferred narcotic agents for pain control are hydromorphone, fentanyl, and methadone. Morphine should not be used. Avoid Baclofen and avoid oral sodium phosphate and magnesium citrate based laxatives / bowel preps. Continue strict Input and Output monitoring. Will monitor the patient closely with you and intervene or adjust therapy as indicated by changes in clinical status/labs    EtOH Cirrhosis -GI following -HRS plan as above  SVT -cardiology following -now bradycardic, will stop midodrine  Pulmonary edema, acute on chronic diastolic CHF exacerbation -worsened pulm edema on CXR 9/19, will give lasix 80mg  IV x 2 doses and reassess from there   Urinary retention -currently with foley   ABLA, hematoma Pancytopenia -s/p EGD 2023-08-11, no overt bleeding -transfuse PRN for hgb <7 -per primary   Shock, c/f septic shock H/o HTN -pressor support & abx per primary service   Metabolic acidosis -stable -likely secondary to AKI and decreased lactate clearance from cirrhosis  Cocaine  positive -will defer to primary service  Discussed with primary service Discussed with granddaughter at the bedside and with son [name] over the phone.  Erika Sar, MD [facility] Erika [name]  Subjective:   Patient seen and examined bedside. In icu now. Was hypotensive after receiving beta blockers for SVT. On levo.   Objective:   BP (!) 109/56 (BP Location: Left Arm)   Pulse (!) 47   Temp (!) 97.5 F (36.4 C) (Axillary)   Resp 16   Ht 5\' 1"  (1.549 m)   Wt 69.8 kg   SpO2 100%   BMI 29.08 kg/m   Intake/Output Summary (Last 24 hours) at 07/22/2023 4098 Last data filed at 07/22/2023 0800 Gross per 24 hour  Intake 1278.25 ml  Output 700 ml  Net 578.25 ml   Weight change:   Physical Exam: Gen: ill appearing, laying flat in bed CVS: bradycardic (HR 40-50s bpm) Resp: fine crackles bibasilar, normal wob Abd: distended, NT Ext: 1+ pitting edema LLE>RLE Neuro: unresponsive  Imaging: [place] ASCITES (ABDOMEN LIMITED)  Result Date: 07/21/2023 CLINICAL DATA:  History of cirrhosis EXAM: LIMITED ABDOMEN ULTRASOUND FOR ASCITES TECHNIQUE: Limited ultrasound survey for ascites was performed in all four abdominal quadrants. COMPARISON:  Abdominal ultrasound 07/17/2023 FINDINGS AND IMPRESSION: Small volume ascites in all 4 quadrants is similar to the prior study from 07/17/2023. Electronically Signed   By: Lesia Hausen M.D.   On: 07/21/2023 13:14   DG CHEST PORT 1 VIEW  Result Date: 07/21/2023 CLINICAL DATA:  Shortness of breath EXAM: PORTABLE CHEST 1 VIEW COMPARISON:  Chest radiograph 07/20/2023 FINDINGS: The heart is enlarged, unchanged. The upper mediastinal contours are normal Airspace opacity throughout both lungs has worsened in the interim likely  reflecting worsened pulmonary edema. There is no significant pleural effusion. There is no pneumothorax There is no acute osseous abnormality. IMPRESSION: Worsened pulmonary edema. Electronically Signed   By: Lesia Hausen M.D.   On: 07/21/2023  13:12    Labs: BMET Recent Labs  Lab 08-13-23 1349 13-Aug-2023 2324 07/17/23 1610 07/27/2023 0746 07/19/23 0658 07/20/23 0901 07/21/23 0828 07/22/23 0252  NA 137  --  137 135 134* 136 138 139  K 5.0  --  4.3 4.2 4.7 4.7 5.1 4.8  CL 105  --  109 104 98 99 102 99  CO2 16*  --  16* 20* 23 20* 20* 21*  GLUCOSE 112*  --  68* 74 72 122* 151* 184*  BUN 75*  --  78* 76* 75* 83* 95* 104*  CREATININE 3.36*  --  3.24* 3.01* 3.20* 3.21* 3.28* 3.53*  CALCIUM 8.0*  --  7.5* 6.9* 6.4* 7.2* 7.6* 7.9*  PHOS  --  6.7* 7.6*  --   --  8.1* 9.0*  --    CBC Recent Labs  Lab 08/13/23 1349 08-13-2023 2324 07/19/23 0658 07/20/23 0901 07/21/23 0828 07/21/23 1914 07/22/23 0252  WBC 5.9   < > 10.4 12.8* 13.1* 13.2* 12.2*  NEUTROABS 5.0  --  9.0*  --  11.2*  --  10.0*  HGB 8.2*   < > 8.4* 8.7* 8.4* 7.6* 7.3*  HCT 24.1*   < > 24.7* 25.9* 25.7* 23.0* 22.7*  MCV 96.4   < > 90.1 93.8 93.1 93.9 96.2  PLT 41*   < > 34* 33* 40* 55* 51*   < > = values in this interval not displayed.    Medications:     Chlorhexidine Gluconate Cloth  6 each Topical Daily   feeding supplement  237 mL Oral BID BM   FLUoxetine  10 mg Oral Daily   lactulose  300 mL Rectal BID   midodrine  10 mg Oral Once   midodrine  10 mg Per Tube TID WC   multivitamin with minerals  1 tablet Per Tube Daily   nicotine  7 mg Transdermal Daily   octreotide  200 mcg Subcutaneous Q12H   mouth rinse  15 mL Mouth Rinse 4 times per day   pantoprazole (PROTONIX) IV  40 mg Intravenous Q24H   rifaximin  550 mg Per Tube BID   thiamine (VITAMIN B1) injection  100 mg Intravenous Q24H      Erika Sar, MD [facility] Erika [name] 07/22/2023, 9:53 AM

## 2023-07-22 NOTE — Progress Notes (Signed)
   07/21/23 1530  Assess: MEWS Score  BP (!) 94/41  MAP (mmHg) (!) 58  ECG Heart Rate (!) 49  Resp 13  Level of Consciousness Responds to Pain  SpO2 100 %  Assess: MEWS Score  MEWS Temp 0  MEWS Systolic 1  MEWS Pulse 1  MEWS RR 1  MEWS LOC 2  MEWS Score 5  MEWS Score Color Red  Assess: if the MEWS score is Yellow or Red  Were vital signs accurate and taken at a resting state? Yes  Does the patient meet 2 or more of the SIRS criteria? No  MEWS guidelines implemented  Yes, red  Treat  MEWS Interventions Considered administering scheduled or prn medications/treatments as ordered  Take Vital Signs  Increase Vital Sign Frequency  Red: Q1hr x2, continue Q4hrs until patient remains green for 12hrs  Escalate  MEWS: Escalate Red: Discuss with charge nurse and notify provider. Consider notifying RRT. If remains red for 2 hours consider need for higher level of care  Notify: Charge Nurse/RN  Name of Charge Nurse/RN Notified Ty., RN  Provider Notification  Provider Name/Title Dr. Lowell Guitar  Date Provider Notified 07/21/23  Time Provider Notified 1530  Method of Notification Page  Notification Reason Other (Comment) (Red MEWS)  Provider response En route  Date of Provider Response 07/21/23  Time of Provider Response 1530  Notify: Rapid Response  Name of Rapid Response RN Notified Nikki, RN  Date Rapid Response Notified 07/21/23  Time Rapid Response Notified 1530  Assess: SIRS CRITERIA  SIRS Temperature  0  SIRS Pulse 0  SIRS Respirations  0  SIRS WBC 1  SIRS Score Sum  1
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
## 2023-07-22 NOTE — Progress Notes (Signed)
eLink Physician-Brief Progress Note Patient Name: Erika [name] DOB: 07/11/1956 MRN: [id number]   Date of Service  07/22/2023  HPI/Events of Note  Plts 51  eICU Interventions  Will hold off tonight's lactulose enema in light of thrombocytopenia.         [name] M DELA CRUZ 07/22/2023, 10:40 PM  4:10 AM AM labs show Hgb 6.3 Pt with baseline anemia, but had come down from 7.3 yesterday.  No evidence of ongoing bleed.  Placed order to transfuse 1 unit pRBC  K also 5.2 (4.8 yesterday). Cr continues to uptrend.  Will give insulin+glucose, bicarb to help shift K intracellularly.    5:37 AM Pt had narrow complex tachycardia 140s, suddenly converted back to her usual sinus rhythm  During this time, she had desaturations down to the 80s on Nasal cannula at 10lpm.   Will change O2 support to heated high flow nasal cannula. Will titrate FiO2 to target SpO2 >90% Will continue to monitor HR closely.   Addressing K as above.  Pt also intermittently restless, agitated, which may be contributing to high HR.  Increased max dose of precedex to 1.70mcg/kg/hr

## 2023-07-22 NOTE — Progress Notes (Signed)
Occupational Therapy Treatment Patient Details Name: Erika [name] MRN: [id number] DOB: 05-Aug-1956 Today's Date: 07/22/2023   History of present illness 67 y.o. female presenting 07/19/2023 after ground level fall with head strike. PMH significant for rheumatoid arthritis, cirrhosis secondary to alcohol abuse, bipolar disorder, history of portal hypertension, history of from a cytopenia, GERD, hypertension.   OT comments  Pt with significant functional decline since last session, in ICU on pressors, max lethargic (RN believes secondary to pain medication however no attempts have been made to sedate pt), therefore re-eval necessary with RN clearance to see. Pt unable to tolerate functional mobility; moved to chair position in bed with total A. Observed to swat once with LUE with repositioning but otherwise no active movement noted. RUE with greater PROM this session (see below). Due to current functional status and support level at home, patient will benefit from continued inpatient follow up therapy, <3 hours/day.       If plan is discharge home, recommend the following:  Two people to help with walking and/or transfers;A lot of help with bathing/dressing/bathroom;Two people to help with bathing/dressing/bathroom;Assistance with cooking/housework;Help with stairs or ramp for entrance;Assist for transportation   Equipment Recommendations  Other (comment) (defer)    Recommendations for Other Services      Precautions / Restrictions Precautions Precautions: Fall Precaution Comments: R UE splint per chart but only gauce dressing donned and sling in room but not donned this sesssion. No weight bearing limitations documented, no fracture. Just swelling. Required Braces or Orthoses: Splint/Cast Splint/Cast - Date Prophylactic Dressing Applied (if applicable): 07/19/23 Restrictions Weight Bearing Restrictions: Yes LLE Weight Bearing: Non weight bearing       Mobility Bed Mobility                General bed mobility comments: total A to reposition in bed and for hair position in bed; no protective response noted with pt slowly falling toward L side with wedge removed from side. Not appropriate to sit EOB this date    Transfers                   General transfer comment: NT     Balance                                           ADL either performed or assessed with clinical judgement   ADL                                         General ADL Comments: total A this session    Extremity/Trunk Assessment Upper Extremity Assessment Upper Extremity Assessment: RUE deficits/detail (LUE with one attempt to swat forward when in pain. Otherwise not moving this session as well. Suspect due to lethargy. Min edema in LUE with restraint and arm band pushed toward hand. REpositioned) RUE Deficits / Details: No active moevement. Tolerating PROM to ~110 degrees flexion and lacking ~10 degrees extension prior to grimacing. Gentle range due to low level of arousal   Lower Extremity Assessment Lower Extremity Assessment: Defer to PT evaluation        Vision   Additional Comments: eyes closed for most of session   Perception     Praxis      Cognition Arousal: Suspect due to medications,  Stuporous   Overall Cognitive Status: No family/caregiver present to determine baseline cognitive functioning                                 General Comments: Max stim to arouse only to painful stimuli this session. Eyes open 3-4x with pt making very brief eye contact again only after pain with repositioning. not following any commands. RN suspecting low level of arousal from last pain medication        Exercises      Shoulder Instructions       General Comments 114/60 (77) supine on arrival; 106/51 (66) hair position in bed; 106/51 (66) after 5 minutes; returned to supine at end of session    Pertinent Vitals/ Pain        Pain Assessment Pain Assessment: PAINAD Facial Expression: Relaxed, neutral Body Movements: Absence of movements Muscle Tension: Relaxed Compliance with ventilator (intubated pts.): N/A Vocalization (extubated pts.): Sighing, moaning CPOT Total: 1 Pain Intervention(s): Monitored during session, Repositioned  Home Living                                          Prior Functioning/Environment              Frequency  Min 1X/week        Progress Toward Goals  OT Goals(current goals can now be found in the care plan section)  Progress towards OT goals: Not progressing toward goals - comment (moved to [facility] sepsis workup.)  Acute Rehab OT Goals Patient Stated Goal: go home OT Goal Formulation: With patient Time For Goal Achievement: 07/31/23 Potential to Achieve Goals: Good  Plan      Co-evaluation    PT/OT/SLP Co-Evaluation/Treatment: Yes Reason for Co-Treatment: For patient/therapist safety PT goals addressed during session: Mobility/safety with mobility OT goals addressed during session: ADL's and self-care      AM-PAC OT "6 Clicks" Daily Activity     Outcome Measure   Help from another person eating meals?: Total Help from another person taking care of personal grooming?: Total Help from another person toileting, which includes using toliet, bedpan, or urinal?: Total Help from another person bathing (including washing, rinsing, drying)?: Total Help from another person to put on and taking off regular upper body clothing?: Total Help from another person to put on and taking off regular lower body clothing?: Total 6 Click Score: 6    End of Session    OT Visit Diagnosis: Unsteadiness on feet (R26.81);Muscle weakness (generalized) (M62.81);History of falling (Z91.81);Other symptoms and signs involving cognitive function   Activity Tolerance Patient limited by lethargy   Patient Left in bed;with call bell/phone within reach;with bed alarm  set   Nurse Communication Mobility status        Time: 8119-1478 OT Time Calculation (min): 28 min  Charges: OT General Charges $OT Visit: 1 Visit OT Evaluation $OT Re-eval: 1 Re-eval  Erika [name], OTR/L [facility] Acute Rehabilitation Office: [phone]   Erika [name] 07/22/2023, 12:31 PM

## 2023-07-23 ENCOUNTER — Inpatient Hospital Stay (HOSPITAL_COMMUNITY): Payer: 59

## 2023-07-23 DIAGNOSIS — G9340 Encephalopathy, unspecified: Secondary | ICD-10-CM | POA: Diagnosis not present

## 2023-07-23 DIAGNOSIS — D500 Iron deficiency anemia secondary to blood loss (chronic): Secondary | ICD-10-CM | POA: Diagnosis not present

## 2023-07-23 DIAGNOSIS — Z515 Encounter for palliative care: Secondary | ICD-10-CM | POA: Diagnosis not present

## 2023-07-23 DIAGNOSIS — Z7189 Other specified counseling: Secondary | ICD-10-CM | POA: Diagnosis not present

## 2023-07-23 DIAGNOSIS — R6521 Severe sepsis with septic shock: Secondary | ICD-10-CM

## 2023-07-23 DIAGNOSIS — K767 Hepatorenal syndrome: Secondary | ICD-10-CM | POA: Diagnosis not present

## 2023-07-23 DIAGNOSIS — A419 Sepsis, unspecified organism: Secondary | ICD-10-CM | POA: Diagnosis not present

## 2023-07-23 DIAGNOSIS — K766 Portal hypertension: Secondary | ICD-10-CM | POA: Diagnosis not present

## 2023-07-23 LAB — PROTIME-INR
INR: 2.2 — ABNORMAL HIGH (ref 0.8–1.2)
Prothrombin Time: 25 seconds — ABNORMAL HIGH (ref 11.4–15.2)

## 2023-07-23 LAB — BASIC METABOLIC PANEL
Anion gap: 27 — ABNORMAL HIGH (ref 5–15)
BUN: 125 mg/dL — ABNORMAL HIGH (ref 8–23)
CO2: 19 mmol/L — ABNORMAL LOW (ref 22–32)
Calcium: 8.1 mg/dL — ABNORMAL LOW (ref 8.9–10.3)
Chloride: 95 mmol/L — ABNORMAL LOW (ref 98–111)
Creatinine, Ser: 4.22 mg/dL — ABNORMAL HIGH (ref 0.44–1.00)
GFR, Estimated: 11 mL/min — ABNORMAL LOW (ref >60)
Glucose, Bld: 170 mg/dL — ABNORMAL HIGH (ref 70–99)
Potassium: 4.9 mmol/L (ref 3.5–5.1)
Sodium: 141 mmol/L (ref 135–145)

## 2023-07-23 LAB — POTASSIUM
Potassium: 6.5 mmol/L (ref 3.5–5.1)
Potassium: 5.1 mmol/L (ref 3.5–5.1)
Potassium: 4.6 mmol/L (ref 3.5–5.1)
Potassium: 4.4 mmol/L (ref 3.5–5.1)

## 2023-07-23 LAB — COMPREHENSIVE METABOLIC PANEL
ALT: 17 U/L (ref 0–44)
AST: 60 U/L — ABNORMAL HIGH (ref 15–41)
Albumin: 4 g/dL (ref 3.5–5.0)
Alkaline Phosphatase: 46 U/L (ref 38–126)
Anion gap: 19 — ABNORMAL HIGH (ref 5–15)
BUN: 120 mg/dL — ABNORMAL HIGH (ref 8–23)
CO2: 20 mmol/L — ABNORMAL LOW (ref 22–32)
Calcium: 8 mg/dL — ABNORMAL LOW (ref 8.9–10.3)
Chloride: 99 mmol/L (ref 98–111)
Creatinine, Ser: 4.12 mg/dL — ABNORMAL HIGH (ref 0.44–1.00)
GFR, Estimated: 11 mL/min — ABNORMAL LOW (ref >60)
Glucose, Bld: 175 mg/dL — ABNORMAL HIGH (ref 70–99)
Potassium: 5.2 mmol/L — ABNORMAL HIGH (ref 3.5–5.1)
Sodium: 138 mmol/L (ref 135–145)
Total Bilirubin: 7.5 mg/dL — ABNORMAL HIGH (ref 0.3–1.2)
Total Protein: 6.9 g/dL (ref 6.5–8.1)

## 2023-07-23 LAB — PREPARE RBC (CROSSMATCH)

## 2023-07-23 LAB — CBC
HCT: 19.6 % — ABNORMAL LOW (ref 36.0–46.0)
Hemoglobin: 6.3 g/dL — CL (ref 12.0–15.0)
MCH: 31.7 pg (ref 26.0–34.0)
MCHC: 32.1 g/dL (ref 30.0–36.0)
MCV: 98.5 fL (ref 80.0–100.0)
Platelets: 51 10*3/uL — ABNORMAL LOW (ref 150–400)
RBC: 1.99 MIL/uL — ABNORMAL LOW (ref 3.87–5.11)
RDW: 15.5 % (ref 11.5–15.5)
WBC: 9.5 10*3/uL (ref 4.0–10.5)
nRBC: 0 % (ref 0.0–0.2)

## 2023-07-23 LAB — HEMOGLOBIN AND HEMATOCRIT, BLOOD
HCT: 21.9 % — ABNORMAL LOW (ref 36.0–46.0)
Hemoglobin: 7.4 g/dL — ABNORMAL LOW (ref 12.0–15.0)

## 2023-07-23 LAB — PHOSPHORUS: Phosphorus: 9.6 mg/dL — ABNORMAL HIGH (ref 2.5–4.6)

## 2023-07-23 LAB — GLUCOSE, CAPILLARY: Glucose-Capillary: 145 mg/dL — ABNORMAL HIGH (ref 70–99)

## 2023-07-23 MED ORDER — DEXTROSE 50 % IV SOLN
1.0000 | Freq: Once | INTRAVENOUS | Status: AC
  Administered 2023-07-23: 50 mL via INTRAVENOUS
  Filled 2023-07-23: qty 50

## 2023-07-23 MED ORDER — SODIUM CHLORIDE 0.9% IV SOLUTION: Freq: Once | INTRAVENOUS | Status: AC

## 2023-07-23 MED ORDER — DEXTROSE 50 % IV SOLN
50.0000 mL | Freq: Once | INTRAVENOUS | Status: DC
  Filled 2023-07-23: qty 50

## 2023-07-23 MED ORDER — INSULIN ASPART 100 UNIT/ML IJ SOLN: 5.0000 [IU] | Freq: Once | INTRAMUSCULAR | Status: DC

## 2023-07-23 MED ORDER — FUROSEMIDE 10 MG/ML IJ SOLN
100.0000 mg | Freq: Two times a day (BID) | INTRAVENOUS | Status: AC
  Administered 2023-07-23 (×2): 100 mg via INTRAVENOUS
  Filled 2023-07-23 (×2): qty 10

## 2023-07-23 MED ORDER — PANTOPRAZOLE SODIUM 40 MG IV SOLR
40.0000 mg | Freq: Two times a day (BID) | INTRAVENOUS | Status: DC
  Administered 2023-07-23 – 2023-07-29 (×13): 40 mg via INTRAVENOUS
  Filled 2023-07-23 (×13): qty 10

## 2023-07-23 MED ORDER — INSULIN ASPART 100 UNIT/ML IV SOLN
5.0000 [IU] | Freq: Once | INTRAVENOUS | Status: AC
  Administered 2023-07-23: 5 [IU] via INTRAVENOUS

## 2023-07-23 MED ORDER — SODIUM BICARBONATE 8.4 % IV SOLN
50.0000 meq | Freq: Once | INTRAVENOUS | Status: DC
  Filled 2023-07-23: qty 50

## 2023-07-23 MED ORDER — SODIUM BICARBONATE 8.4 % IV SOLN
50.0000 meq | Freq: Once | INTRAVENOUS | Status: AC
  Administered 2023-07-23: 50 meq via INTRAVENOUS
  Filled 2023-07-23: qty 50

## 2023-07-23 NOTE — Progress Notes (Signed)
  Interdisciplinary Goals of Care Family Meeting   Date carried out: 07/23/2023  Location of the meeting: Conference room  Member's involved: Physician, Bedside Registered Nurse, Family Member or next of kin, and Palliative care team member  Durable Power of Attorney or acting medical decision maker: patients son    Discussion: We discussed goals of care for Erika [name] .   Family still having a difficult time making decisions They understand that she is doing very poorly with limited options of care Son will want other family members to visit before making any decisions regarding DNR measures/ other ongoing care  Code status:   Code Status: Full Code   Disposition: Continue current acute care  Time spent for the meeting: 30    Tomma Lightning, MD  07/23/2023, 3:49 PM

## 2023-07-23 NOTE — Progress Notes (Addendum)
Brief Progress Note Approached by RN for increased K of 6.5 despite earlier dosing at 5 am of 5 units insulin, 50 cc D 50 and 50 MEq of Sodium bicarb.At that time patient K was 5.2 and did respons by droppibng to 4.9, but within 2 hours had increased to 6.5.  As there is no option for PO dosing of Lokelma , and lactulose, as patient is too lethargic to take po's and she does not have feeding tube 2/2 varices ( GI Bleed) I will repeat stat K level as EKG is stable. Potassium jumped in a short period of time, I want to make sure it is correct. If it remains elevated , we will repeat treatment 5 units insulin, 50 cc D 50 and 50 MEq of Sodium bicarb.We will recheck K in 1 hour. Will notify renal also.  Pt currently in SB, rate of 58 Pt. Family are meant to meet with Palliation team today to discuss goals of care.

## 2023-07-23 NOTE — Consult Note (Signed)
Palliative Care Consult Note                                  Date: 07/23/2023   Patient Name: Erika [name]  DOB: [birth year]  MRN: [id number]  Age / Sex: 67 y.o., female  PCP: Claiborne Rigg, NP Referring Physician: Martina Sinner, MD  Reason for Consultation: Establishing goals of care  HPI/Patient Profile: 67 y.o. female  with past medical history of rheumatoid arthritis, cirrhosis secondary to alcohol abuse, bipolar disorder, history of portal hypertension, GERD, HTN, chronic pain on lyrica admitted on 07/12/2023 with left hip swelling post fall one week prior. She was admitted for acute blood loss anemia due to left hip hematoma and heme positive stool.   She has since developed altered mental status, hypotension, and bradycardia and is in the ICU.  Past Medical History:  Diagnosis Date   Arthritis    Cirrhosis (HCC)    Depression    GERD (gastroesophageal reflux disease)    History of shingles 10/18/2018   Hypertension    Rheumatoid arthritis(714.0)     Subjective:   I have reviewed medical records including EPIC notes, labs and imaging, received update from nursing staff, assessed the patient and then met with the patient's family and attending Dr. Wynona Neat to discuss diagnosis prognosis, GOC, EOL wishes, disposition and options. Patient's son and three granddaughters were present. Patient's daughter arrived at end of meeting.  I introduced Palliative Medicine as specialized medical care for people living with serious illness. It focuses on providing relief from symptoms and stress of a serious illness. The goal is to improve quality of life for both the patient and the family.  Today's Discussion: Family shared their understanding of patient's chronic and acute condition. Dr. Wynona Neat reviewed his understanding, including limitations of ongoing interventions and high risk for further decline despite aggressive treatment  efforts.This news was understandably hard for the family to hear.  A discussion was had today regarding advanced directives. Concepts specific to code status and artifical feeding and hydration was had. Recommended consideration of DNR status, understanding evidenced-based poor outcomes in similar hospitalized patients, as the cause of the arrest is likely associated with chronic/terminal disease rather than a reversible acute cardio-pulmonary event. Patient's son understands but would like time for family to come and visit the patient before changing code status or scope of treatment. Patient remains full code and full scope.  The difference between a aggressive medical intervention path and a palliative comfort care path for this patient at this time was had.   Discussed the importance of continued conversation with family and the medical providers regarding overall plan of care and treatment options, ensuring decisions are within the context of the patient's values and GOCs.  Questions and concerns were addressed. Hard Choices booklet left for review. The family was encouraged to call with questions or concerns. PMT will continue to support holistically.  Review of Systems  Unable to perform ROS   Objective:   Primary Diagnoses: Present on Admission:  Blood loss anemia  Rheumatoid arthritis (HCC)  Thrombocytopenia (HCC)  Alcohol use disorder  GERD (gastroesophageal reflux disease)  Bipolar 2 disorder (HCC)  Prolonged QT interval  Cardiomegaly  Occult blood positive stool  Tobacco abuse  Acute encephalopathy   Physical Exam Vitals reviewed.  Constitutional:      Appearance: She is ill-appearing.     Interventions: She is intubated.  HENT:     Head: Atraumatic.  Pulmonary:     Effort: She is intubated.  Neurological:     Mental Status: She is unresponsive.     Vital Signs:  BP (!) 163/68 (BP Location: Left Arm)   Pulse (!) 56   Temp 98.1 F (36.7 C) (Oral)   Resp 18    Ht 5\' 1"  (1.549 m)   Wt 69.8 kg   SpO2 98%   BMI 29.08 kg/m   Palliative Assessment/Data: 10%    Advanced Care Planning:   Existing Vynca/ACP Documentation: None  Primary Decision Maker: NEXT OF KIN  Code Status/Advance Care Planning: Full code  Assessment & Plan:  SUMMARY OF RECOMMENDATIONS   Full code Full scope Continued conversation between family re: goals of care Continued PMT support-- follow up with son Public house manager tomorrow   Discussed with: Dr. Wynona Neat and bedside RN  Time Total: 60 minutes  Thank you for allowing [place] to participate in the care of KHALIS LASANE PMT will continue to support holistically.   Signed by: Sarina Ser, NP Palliative Medicine Team  Team Phone # [phone] (Nights/Weekends)  07/23/2023, 4:11 PM

## 2023-07-23 NOTE — Progress Notes (Signed)
NAME:  Erika [name], MRN:  [id number], DOB:  1956-01-29, LOS: 7 ADMISSION DATE:  07/22/2023, CONSULTATION DATE:  07/21/23 REFERRING MD:  Grier Mitts., MD CHIEF COMPLAINT:  Hypotension/AMS   History of Present Illness:  Erika [name] is a 67 y.o. female with a hx of rheumatoid arthritis, cirrhosis secondary to alcohol abuse, bipolar disorder, history of portal hypertension, GERD, HTN, chronic pain on lyrica who was admitted 9/14 for swelling of her left hip after suffering a fall the week before. She was admitted for acute blood loss anemia due to left hip hematoma and heme positive stool.   Today she developed runs of SVT treated with beta blockers. She developed hypotension and altered mental status with bradycardia this afternoon in which PCCM was consulted.   Nephrology is following for AKI in setting of hepato renal syndrome. Gi has been following for cirrhosis and esophageal varices and portal hypertension. She has been on lactulose and rifaximin for hepatic encephalopathy.  Pertinent  Medical History   Past Medical History:  Diagnosis Date   Arthritis    Cirrhosis (HCC)    Depression    GERD (gastroesophageal reflux disease)    History of shingles 10/18/2018   Hypertension    Rheumatoid arthritis(714.0)    Significant Hospital Events: Including procedures, antibiotic start and stop dates in addition to other pertinent events   9/14 admitted 9/19 Altered mental status, hypotension and bradycardia, transferred to ICU. Unable to obtain fluid for paracentesis 9/21 goals of care discussions today  Interim History / Subjective:  Remains on Levophed Encephalopathic  Objective   Blood pressure (!) 163/68, pulse (!) 56, temperature 98.1 F (36.7 C), temperature source Oral, resp. rate 18, height 5\' 1"  (1.549 m), weight 69.8 kg, SpO2 98%.    FiO2 (%):  [35 %-50 %] 50 %   Intake/Output Summary (Last 24 hours) at 07/23/2023 1554 Last data filed at 07/23/2023  1521 Gross per 24 hour  Intake 1255.55 ml  Output 310 ml  Net 945.55 ml   Filed Weights   07/19/2023 1328 07/22/23 0500  Weight: 65.8 kg 69.8 kg    Examination: General: Chronically ill-appearing HENT: Anicteric, moist oral mucosa Lungs: No wheezing, rales Cardiovascular: S1-S2 appreciated Abdomen: Soft, bowel sounds appreciated Extremities: Ecchymosis left leg Neuro: not alert, PERRL GU: Foley   Resolved Hospital Problem list     Assessment & Plan:   Septic shock Metabolic encephalopathy Possible pneumonia -No growth on blood cultures -No significant fluid for paracentesis -Continue empiric antibiotics cefepime and linezolid  Alcoholic cirrhosis Portal hypertension Esophageal varices Metabolic encephalopathy/hepatic encephalopathy/hepatorenal syndrome -S/p EGD 916 with no overt bleeding -Continue lactulose enemas -Unable to take p.o. because of altered mental status -Hold on placing NG  Acute kidney injury Hepatorenal syndrome -Octreotide -Appreciate nephrology input -Maintain MAP greater than 65  Anemia Thrombocytopenia - trend CBC, no transfusion required  Acute on chronic diastolic heart failure Paroxysmal atrial fibrillation SVT -Beta-blockers on hold -On Lasix  Peptic ulcer disease - PPI BID x 8 weeks per GI  Goals of care discussion with family  Best Practice (right click and "Reselect all SmartList Selections" daily)   Diet/type: NPO DVT prophylaxis: not indicated GI prophylaxis: PPI Lines: N/A Foley:  Yes, and it is still needed Code Status:  full code Last date of multidisciplinary goals of care discussion [9/19 with son]  Labs   CBC: Recent Labs  Lab 07/19/23 0658 07/20/23 0901 07/21/23 0828 07/21/23 1914 07/22/23 0252 07/23/23 0308 07/23/23 1220  WBC 10.4  NAME:  Erika [name], MRN:  [id number], DOB:  1956-01-29, LOS: 7 ADMISSION DATE:  07/22/2023, CONSULTATION DATE:  07/21/23 REFERRING MD:  Grier Mitts., MD CHIEF COMPLAINT:  Hypotension/AMS   History of Present Illness:  Erika [name] is a 67 y.o. female with a hx of rheumatoid arthritis, cirrhosis secondary to alcohol abuse, bipolar disorder, history of portal hypertension, GERD, HTN, chronic pain on lyrica who was admitted 9/14 for swelling of her left hip after suffering a fall the week before. She was admitted for acute blood loss anemia due to left hip hematoma and heme positive stool.   Today she developed runs of SVT treated with beta blockers. She developed hypotension and altered mental status with bradycardia this afternoon in which PCCM was consulted.   Nephrology is following for AKI in setting of hepato renal syndrome. Gi has been following for cirrhosis and esophageal varices and portal hypertension. She has been on lactulose and rifaximin for hepatic encephalopathy.  Pertinent  Medical History   Past Medical History:  Diagnosis Date   Arthritis    Cirrhosis (HCC)    Depression    GERD (gastroesophageal reflux disease)    History of shingles 10/18/2018   Hypertension    Rheumatoid arthritis(714.0)    Significant Hospital Events: Including procedures, antibiotic start and stop dates in addition to other pertinent events   9/14 admitted 9/19 Altered mental status, hypotension and bradycardia, transferred to ICU. Unable to obtain fluid for paracentesis 9/21 goals of care discussions today  Interim History / Subjective:  Remains on Levophed Encephalopathic  Objective   Blood pressure (!) 163/68, pulse (!) 56, temperature 98.1 F (36.7 C), temperature source Oral, resp. rate 18, height 5\' 1"  (1.549 m), weight 69.8 kg, SpO2 98%.    FiO2 (%):  [35 %-50 %] 50 %   Intake/Output Summary (Last 24 hours) at 07/23/2023 1554 Last data filed at 07/23/2023  1521 Gross per 24 hour  Intake 1255.55 ml  Output 310 ml  Net 945.55 ml   Filed Weights   07/19/2023 1328 07/22/23 0500  Weight: 65.8 kg 69.8 kg    Examination: General: Chronically ill-appearing HENT: Anicteric, moist oral mucosa Lungs: No wheezing, rales Cardiovascular: S1-S2 appreciated Abdomen: Soft, bowel sounds appreciated Extremities: Ecchymosis left leg Neuro: not alert, PERRL GU: Foley   Resolved Hospital Problem list     Assessment & Plan:   Septic shock Metabolic encephalopathy Possible pneumonia -No growth on blood cultures -No significant fluid for paracentesis -Continue empiric antibiotics cefepime and linezolid  Alcoholic cirrhosis Portal hypertension Esophageal varices Metabolic encephalopathy/hepatic encephalopathy/hepatorenal syndrome -S/p EGD 916 with no overt bleeding -Continue lactulose enemas -Unable to take p.o. because of altered mental status -Hold on placing NG  Acute kidney injury Hepatorenal syndrome -Octreotide -Appreciate nephrology input -Maintain MAP greater than 65  Anemia Thrombocytopenia - trend CBC, no transfusion required  Acute on chronic diastolic heart failure Paroxysmal atrial fibrillation SVT -Beta-blockers on hold -On Lasix  Peptic ulcer disease - PPI BID x 8 weeks per GI  Goals of care discussion with family  Best Practice (right click and "Reselect all SmartList Selections" daily)   Diet/type: NPO DVT prophylaxis: not indicated GI prophylaxis: PPI Lines: N/A Foley:  Yes, and it is still needed Code Status:  full code Last date of multidisciplinary goals of care discussion [9/19 with son]  Labs   CBC: Recent Labs  Lab 07/19/23 0658 07/20/23 0901 07/21/23 0828 07/21/23 1914 07/22/23 0252 07/23/23 0308 07/23/23 1220  WBC 10.4  NAME:  Erika [name], MRN:  [id number], DOB:  1956-01-29, LOS: 7 ADMISSION DATE:  07/22/2023, CONSULTATION DATE:  07/21/23 REFERRING MD:  Grier Mitts., MD CHIEF COMPLAINT:  Hypotension/AMS   History of Present Illness:  Erika [name] is a 67 y.o. female with a hx of rheumatoid arthritis, cirrhosis secondary to alcohol abuse, bipolar disorder, history of portal hypertension, GERD, HTN, chronic pain on lyrica who was admitted 9/14 for swelling of her left hip after suffering a fall the week before. She was admitted for acute blood loss anemia due to left hip hematoma and heme positive stool.   Today she developed runs of SVT treated with beta blockers. She developed hypotension and altered mental status with bradycardia this afternoon in which PCCM was consulted.   Nephrology is following for AKI in setting of hepato renal syndrome. Gi has been following for cirrhosis and esophageal varices and portal hypertension. She has been on lactulose and rifaximin for hepatic encephalopathy.  Pertinent  Medical History   Past Medical History:  Diagnosis Date   Arthritis    Cirrhosis (HCC)    Depression    GERD (gastroesophageal reflux disease)    History of shingles 10/18/2018   Hypertension    Rheumatoid arthritis(714.0)    Significant Hospital Events: Including procedures, antibiotic start and stop dates in addition to other pertinent events   9/14 admitted 9/19 Altered mental status, hypotension and bradycardia, transferred to ICU. Unable to obtain fluid for paracentesis 9/21 goals of care discussions today  Interim History / Subjective:  Remains on Levophed Encephalopathic  Objective   Blood pressure (!) 163/68, pulse (!) 56, temperature 98.1 F (36.7 C), temperature source Oral, resp. rate 18, height 5\' 1"  (1.549 m), weight 69.8 kg, SpO2 98%.    FiO2 (%):  [35 %-50 %] 50 %   Intake/Output Summary (Last 24 hours) at 07/23/2023 1554 Last data filed at 07/23/2023  1521 Gross per 24 hour  Intake 1255.55 ml  Output 310 ml  Net 945.55 ml   Filed Weights   07/19/2023 1328 07/22/23 0500  Weight: 65.8 kg 69.8 kg    Examination: General: Chronically ill-appearing HENT: Anicteric, moist oral mucosa Lungs: No wheezing, rales Cardiovascular: S1-S2 appreciated Abdomen: Soft, bowel sounds appreciated Extremities: Ecchymosis left leg Neuro: not alert, PERRL GU: Foley   Resolved Hospital Problem list     Assessment & Plan:   Septic shock Metabolic encephalopathy Possible pneumonia -No growth on blood cultures -No significant fluid for paracentesis -Continue empiric antibiotics cefepime and linezolid  Alcoholic cirrhosis Portal hypertension Esophageal varices Metabolic encephalopathy/hepatic encephalopathy/hepatorenal syndrome -S/p EGD 916 with no overt bleeding -Continue lactulose enemas -Unable to take p.o. because of altered mental status -Hold on placing NG  Acute kidney injury Hepatorenal syndrome -Octreotide -Appreciate nephrology input -Maintain MAP greater than 65  Anemia Thrombocytopenia - trend CBC, no transfusion required  Acute on chronic diastolic heart failure Paroxysmal atrial fibrillation SVT -Beta-blockers on hold -On Lasix  Peptic ulcer disease - PPI BID x 8 weeks per GI  Goals of care discussion with family  Best Practice (right click and "Reselect all SmartList Selections" daily)   Diet/type: NPO DVT prophylaxis: not indicated GI prophylaxis: PPI Lines: N/A Foley:  Yes, and it is still needed Code Status:  full code Last date of multidisciplinary goals of care discussion [9/19 with son]  Labs   CBC: Recent Labs  Lab 07/19/23 0658 07/20/23 0901 07/21/23 0828 07/21/23 1914 07/22/23 0252 07/23/23 0308 07/23/23 1220  WBC 10.4  NAME:  Erika [name], MRN:  [id number], DOB:  1956-01-29, LOS: 7 ADMISSION DATE:  07/11/2023, CONSULTATION DATE:  07/21/23 REFERRING MD:  Grier Mitts., MD CHIEF COMPLAINT:  Hypotension/AMS   History of Present Illness:  Erika [name] is a 67 y.o. female with a hx of rheumatoid arthritis, cirrhosis secondary to alcohol abuse, bipolar disorder, history of portal hypertension, GERD, HTN, chronic pain on lyrica who was admitted 9/14 for swelling of her left hip after suffering a fall the week before. She was admitted for acute blood loss anemia due to left hip hematoma and heme positive stool.   Today she developed runs of SVT treated with beta blockers. She developed hypotension and altered mental status with bradycardia this afternoon in which PCCM was consulted.   Nephrology is following for AKI in setting of hepato renal syndrome. Gi has been following for cirrhosis and esophageal varices and portal hypertension. She has been on lactulose and rifaximin for hepatic encephalopathy.  Pertinent  Medical History   Past Medical History:  Diagnosis Date   Arthritis    Cirrhosis (HCC)    Depression    GERD (gastroesophageal reflux disease)    History of shingles 10/18/2018   Hypertension    Rheumatoid arthritis(714.0)    Significant Hospital Events: Including procedures, antibiotic start and stop dates in addition to other pertinent events   9/14 admitted 9/19 Altered mental status, hypotension and bradycardia, transferred to ICU. Unable to obtain fluid for paracentesis 9/21 goals of care discussions today  Interim History / Subjective:  Remains on Levophed Encephalopathic  Objective   Blood pressure (!) 163/68, pulse (!) 56, temperature 98.1 F (36.7 C), temperature source Oral, resp. rate 18, height 5\' 1"  (1.549 m), weight 69.8 kg, SpO2 98%.    FiO2 (%):  [35 %-50 %] 50 %   Intake/Output Summary (Last 24 hours) at 07/23/2023 1554 Last data filed at 07/23/2023  1521 Gross per 24 hour  Intake 1255.55 ml  Output 310 ml  Net 945.55 ml   Filed Weights   07/07/2023 1328 07/22/23 0500  Weight: 65.8 kg 69.8 kg    Examination: General: Chronically ill-appearing HENT: Anicteric, moist oral mucosa Lungs: No wheezing, rales Cardiovascular: S1-S2 appreciated Abdomen: Soft, bowel sounds appreciated Extremities: Ecchymosis left leg Neuro: not alert, PERRL GU: Foley   Resolved Hospital Problem list     Assessment & Plan:   Septic shock Metabolic encephalopathy Possible pneumonia -No growth on blood cultures -No significant fluid for paracentesis -Continue empiric antibiotics cefepime and linezolid  Alcoholic cirrhosis Portal hypertension Esophageal varices Metabolic encephalopathy/hepatic encephalopathy/hepatorenal syndrome -S/p EGD 916 with no overt bleeding -Continue lactulose enemas -Unable to take p.o. because of altered mental status -Hold on placing NG  Acute kidney injury Hepatorenal syndrome -Octreotide -Appreciate nephrology input -Maintain MAP greater than 65  Anemia Thrombocytopenia - trend CBC, no transfusion required  Acute on chronic diastolic heart failure Paroxysmal atrial fibrillation SVT -Beta-blockers on hold -On Lasix  Peptic ulcer disease - PPI BID x 8 weeks per GI  Goals of care discussion with family  Best Practice (right click and "Reselect all SmartList Selections" daily)   Diet/type: NPO DVT prophylaxis: not indicated GI prophylaxis: PPI Lines: N/A Foley:  Yes, and it is still needed Code Status:  full code Last date of multidisciplinary goals of care discussion [9/19 with son]  Labs   CBC: Recent Labs  Lab 07/19/23 0658 07/20/23 0901 07/21/23 0828 07/21/23 1914 07/22/23 0252 07/23/23 0308 07/23/23 1220  WBC 10.4

## 2023-07-23 NOTE — Progress Notes (Addendum)
eLink Physician-Brief Progress Note Patient Name: Erika [name] DOB: 08/12/1956 MRN: [id number]   Date of Service  07/23/2023  HPI/Events of Note  Camera eval done and discussed with bed side RN for change in pupillary diameter by 1 mm on left at 3, compared to 2. Confirmed by 2 RN. New change.  VS: Off of pressor. HR 52. On precedex. Got dilaudid earlier for pain.  Encephalopathic. On nasal o2. Sats good.  INR 2. Cirrhosis.   eICU Interventions  Stat CT head to rule out any bleed or CVA. Asp precautions.      Intervention Category Intermediate Interventions: Change in mental status - evaluation and management  Ranee Gosselin 07/23/2023, 7:55 PM  21:57 Pt has Lactulose per ballon rectal tube, doesn't have order for flexi, plus platelets 51 this AM. Don't have NG tube. Has esophageal varices. - since platelet count > 50 K, should be ok to insert flexi seal with caution for any bleeding.  03:14 CT head: no acute changes, reviewed earlier.

## 2023-07-23 NOTE — Progress Notes (Addendum)
[facility] ROUNDING NOTE   Subjective: Patient continues to be significantly agitated, requiring sedation with Precedex.  Has had hemodynamic variability, but overall stable pressor requirement. Hemoglobin drifted to 6 without any overt bleeding.  She received 1 unit PRBCs today.  No bowel movement yesterday per nursing.  Lactulose enema was apparently held overnight due to concern of thrombocytopenia. Renal function/urinary output worsening, developing hyperkalemia. Family meeting planned for today to discuss goals of care   Objective: Vital signs in last 24 hours: Temp:  [97.6 F (36.4 C)-98.6 F (37 C)] 98 F (36.7 C) (09/21 1140) Pulse Rate:  [50-136] 56 (09/21 1200) Resp:  [9-23] 18 (09/21 1200) BP: (74-168)/(13-75) 163/68 (09/21 1200) SpO2:  [73 %-100 %] 98 % (09/21 1200) FiO2 (%):  [35 %-50 %] 50 % (09/21 0631) Last BM Date : 07/23/23 General: Ill-appearing Caucasian female, sedated on Precedex Lungs: Coarse breath sounds Heart: Bradycardic, no m/r/g Abdomen:  Soft, NT, ND, +BS     Intake/Output from previous day: 09/20 0701 - 09/21 0700 In: 1871.3 [I.V.:697.4; IV Piggyback:1173.9] Out: 240 [Urine:240] Intake/Output this shift: Total I/O In: 342.3 [I.V.:122.3; Blood:220] Out: 110 [Urine:110]   Lab Results: Recent Labs    07/21/23 1914 07/22/23 0252 07/23/23 0308 07/23/23 1220  WBC 13.2* 12.2* 9.5  --   HGB 7.6* 7.3* 6.3* 7.4*  PLT 55* 51* 51*  --   MCV 93.9 96.2 98.5  --    BMET Recent Labs    07/22/23 0252 07/23/23 0308 07/23/23 0611 07/23/23 0821 07/23/23 1057  NA 139 138 141  --   --   K 4.8 5.2* 4.9 6.5* 5.1  CL 99 99 95*  --   --   CO2 21* 20* 19*  --   --   GLUCOSE 184* 175* 170*  --   --   BUN 104* 120* 125*  --   --   CREATININE 3.53* 4.12* 4.22*  --   --   CALCIUM 7.9* 8.0* 8.1*  --   --    LFT Recent Labs    07/21/23 0828 07/22/23 0252 07/23/23 0308  PROT 6.1* 7.0 6.9  ALBUMIN 2.0* 3.8 4.0  AST 47* 49* 60*   ALT 16 15 17   ALKPHOS 77 63 46  BILITOT 7.4* 7.9* 7.5*   PT/INR Recent Labs    07/23/23 0308  INR 2.2*      Imaging/Other results: No results found.    Assessment and Plan: 67 year old female with decompensated alcohol cirrhosis, admitted following fall at home, with acute kidney injury, anemia and dark stool. She underwent an EGD September 16 notable for grade 2 esophageal varices and portal hypertensive gastropathy as well as numerous gastric and duodenal ulcers, attributed to frequent NSAID use. Her renal function did not improve with volume resuscitation, making hepatorenal syndrome her most likely diagnosis.   She experienced acute clinical deterioration manifested by significant hypotension, SVT with wide-complex tachycardia followed by significant bradycardia following admission of metoprolol, and significant change of mental status characterized by agitation and confusion.  She has required pressors to maintain maps, and there is high degree of suspicion for sepsis from her upper extremity wound.   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  Would not consider thrombocytopenia contraindication to administering enemas. - Rifaximin 550 twice daily if able to take p.o. - Agree with thiamine   Esophageal varices without bleeding stigmata - Not banded due to absence of stigmata and severe  thrombocytopenia - Avoid nonselective beta-blockers given persistent severe renal insufficiency, likely HRS -Low suspicion for bleeding varices currently, but would add back octreotide drip if patient demonstrates any evidence of overt bleeding -No plans for upper endoscopy today - Please contact GI if there is evidence of overt bleeding    Hepatorenal syndrome/ATN - Defer to nephrology, but consider resuming octreotide drip in setting of hypotension/questionable sepsis - Continue albumin for volume support -Currently receiving octreotide 200 mcg twice daily - Midodrine stopped per nephrology given bradycardia - No plans for dialysis due to not being liver transplant candidate   Peptic ulcer disease secondary to NSAIDs -Continue PPI twice daily   Shock, suspect septic shock from RUE wound (growing Staph Aureus) - Defer resuscitation/work up to critical care team, currently on Levophed -On linezolid, cefepime -Very unlikely to be SBP given scant ascites, unable to find adequate pocket   Patient's prognosis is extremely grim at this point with decompensated cirrhosis, HRS and likely sepsis.  GI will sign off at this time.  Please reconsult if there are further questions regarding her liver disease or concerns for active bleeding   Jenel Lucks, MD  07/23/2023, 12:54 PM [facility]

## 2023-07-23 NOTE — Progress Notes (Signed)
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 Relatively bland urine sediment, no obstruction on imaging. Has been adequately volume resuscitated since she's been admitted (now hypervolemic). Seems to now be in ATN unfortunately -c/w octreotide and levophed, stopped midodrine given bradycardia.  -kidney function worsening, now oligo-anuric, Cr up to 4.2 -seems that she is not a liver transplant candidate (last drink was only a few weeks ago and her UDS was +cocaine). Therefore, would recommend against consideration of CRRT/HD but family has to make final decision. Awaiting palliative care meeting with family to determine GOC -will give lasix 100mg  IV BID x 1 day today -Avoid nephrotoxic medications including NSAIDs and iodinated intravenous contrast exposure unless the latter is absolutely indicated.  Preferred narcotic agents for pain control are hydromorphone, fentanyl, and methadone. Morphine should not be used. Avoid Baclofen and avoid oral sodium phosphate and magnesium citrate based laxatives / bowel preps. Continue strict Input and Output monitoring. Will monitor the patient closely with you and intervene or adjust therapy as indicated by changes in clinical status/labs    EtOH Cirrhosis -GI following -HRS plan as above  SVT -cardiology following  Pulmonary edema, acute on chronic diastolic CHF exacerbation -worsened pulm edema on CXR 9/19, will give lasix 100mg  IV x 2 doses and reassess from there  Hyperkalemia -c/w shifting agents + cal gluc PRN, can't receive PO/NGT due to varices. Lasix as above to see she can achieve kaliuresis -if family wants to move forward with dialysis then will need to plan for CRRT starting today otherwise will await GOC meeting -repeat pending (especially it seems that the K was drawn while she was receiving PRBC)   Urinary retention -currently with  foley   ABLA, hematoma Pancytopenia -transfuse PRN for hgb <7 -per primary   Shock, c/f septic shock H/o HTN -pressor support & abx per primary service   Metabolic acidosis -stable -likely secondary to AKI and decreased lactate clearance from cirrhosis  Cocaine positive -will defer to primary service  Discussed with ICU RN and primary service.  [name], MD [facility]  Subjective:   Patient seen and examined bedside. Uop only  ~0.2L, net post 1.6L K 6.5, received shifting agents. Can't have a NGT given esophageal varices therefore can't receive lokelma Hgb down to 6.3, receiving 1u prbc. Pressors: NE   Objective:   BP (!) 154/60   Pulse (!) 57   Temp 98.6 F (37 C) (Oral)   Resp 16   Ht 5\' 1"  (1.549 m)   Wt 69.8 kg   SpO2 98%   BMI 29.08 kg/m   Intake/Output Summary (Last 24 hours) at 07/23/2023 1022 Last data filed at 07/23/2023 1015 Gross per 24 hour  Intake 1737.63 ml  Output 90 ml  Net 1647.63 ml   Weight change:   Physical Exam: Gen: ill appearing CVS: bradycardic (HR 40-50s bpm) Resp: fine crackles bibasilar, on HFNC Abd: distended, NT Ext: 1+ pitting edema LLE>RLE Neuro: unresponsive  Imaging: [place] ASCITES (ABDOMEN LIMITED)  Result Date: 07/21/2023 CLINICAL DATA:  History of cirrhosis EXAM: LIMITED ABDOMEN ULTRASOUND FOR ASCITES TECHNIQUE: Limited ultrasound survey for ascites was performed in all four abdominal quadrants. COMPARISON:  Abdominal ultrasound 07/17/2023 FINDINGS AND IMPRESSION: Small volume ascites in all 4 quadrants is similar to the prior study from 07/17/2023. Electronically Signed   By: Lesia Hausen M.D.   On: 07/21/2023 13:14    Labs:  BMET Recent Labs  Lab 21-Jul-2023 2324 07/17/23 5284 07/10/2023 0746 07/19/23 1324 07/20/23 0901 07/21/23 0828 07/22/23 0252 07/23/23 0308 07/23/23 0611 07/23/23 0821  NA  --  137 135 134* 136 138 139 138 141  --   K  --  4.3 4.2 4.7 4.7 5.1 4.8 5.2* 4.9 6.5*  CL  --  109  104 98 99 102 99 99 95*  --   CO2  --  16* 20* 23 20* 20* 21* 20* 19*  --   GLUCOSE  --  68* 74 72 122* 151* 184* 175* 170*  --   BUN  --  78* 76* 75* 83* 95* 104* 120* 125*  --   CREATININE  --  3.24* 3.01* 3.20* 3.21* 3.28* 3.53* 4.12* 4.22*  --   CALCIUM  --  7.5* 6.9* 6.4* 7.2* 7.6* 7.9* 8.0* 8.1*  --   PHOS 6.7* 7.6*  --   --  8.1* 9.0*  --  9.6*  --   --    CBC Recent Labs  Lab Jul 21, 2023 1349 2023-07-21 2324 07/19/23 0658 07/20/23 0901 07/21/23 0828 07/21/23 1914 07/22/23 0252 07/23/23 0308  WBC 5.9   < > 10.4   < > 13.1* 13.2* 12.2* 9.5  NEUTROABS 5.0  --  9.0*  --  11.2*  --  10.0*  --   HGB 8.2*   < > 8.4*   < > 8.4* 7.6* 7.3* 6.3*  HCT 24.1*   < > 24.7*   < > 25.7* 23.0* 22.7* 19.6*  MCV 96.4   < > 90.1   < > 93.1 93.9 96.2 98.5  PLT 41*   < > 34*   < > 40* 55* 51* 51*   < > = values in this interval not displayed.    Medications:     sodium chloride   Intravenous Once   Chlorhexidine Gluconate Cloth  6 each Topical Daily   dextrose  50 mL Intravenous Once   feeding supplement  237 mL Oral BID BM   FLUoxetine  10 mg Oral Daily   insulin aspart  5 Units Subcutaneous Once   lactulose  300 mL Rectal BID   midodrine  10 mg Oral Once   multivitamin with minerals  1 tablet Oral Daily   nicotine  7 mg Transdermal Daily   octreotide  200 mcg Subcutaneous Q12H   mouth rinse  15 mL Mouth Rinse 4 times per day   pantoprazole (PROTONIX) IV  40 mg Intravenous Q24H   rifaximin  550 mg Oral BID   sodium bicarbonate  50 mEq Intravenous Once   thiamine (VITAMIN B1) injection  100 mg Intravenous Q24H      [name], MD [facility] 07/23/2023, 10:22 AM

## 2023-07-24 ENCOUNTER — Encounter (HOSPITAL_COMMUNITY): Payer: Self-pay | Admitting: *Deleted

## 2023-07-24 DIAGNOSIS — Z7189 Other specified counseling: Secondary | ICD-10-CM | POA: Diagnosis not present

## 2023-07-24 DIAGNOSIS — D500 Iron deficiency anemia secondary to blood loss (chronic): Secondary | ICD-10-CM | POA: Diagnosis not present

## 2023-07-24 DIAGNOSIS — Z515 Encounter for palliative care: Secondary | ICD-10-CM | POA: Diagnosis not present

## 2023-07-24 LAB — COMPREHENSIVE METABOLIC PANEL
ALT: 22 U/L (ref 0–44)
AST: 85 U/L — ABNORMAL HIGH (ref 15–41)
Albumin: 3.1 g/dL — ABNORMAL LOW (ref 3.5–5.0)
Alkaline Phosphatase: 49 U/L (ref 38–126)
Anion gap: 16 — ABNORMAL HIGH (ref 5–15)
BUN: 133 mg/dL — ABNORMAL HIGH (ref 8–23)
CO2: 19 mmol/L — ABNORMAL LOW (ref 22–32)
Calcium: 7.5 mg/dL — ABNORMAL LOW (ref 8.9–10.3)
Chloride: 103 mmol/L (ref 98–111)
Creatinine, Ser: 4.07 mg/dL — ABNORMAL HIGH (ref 0.44–1.00)
GFR, Estimated: 11 mL/min — ABNORMAL LOW (ref >60)
Glucose, Bld: 175 mg/dL — ABNORMAL HIGH (ref 70–99)
Potassium: 4.4 mmol/L (ref 3.5–5.1)
Sodium: 138 mmol/L (ref 135–145)
Total Bilirubin: 7.3 mg/dL — ABNORMAL HIGH (ref 0.3–1.2)
Total Protein: 6.2 g/dL — ABNORMAL LOW (ref 6.5–8.1)

## 2023-07-24 LAB — CBC
HCT: 21.6 % — ABNORMAL LOW (ref 36.0–46.0)
Hemoglobin: 7 g/dL — ABNORMAL LOW (ref 12.0–15.0)
MCH: 30.8 pg (ref 26.0–34.0)
MCHC: 32.4 g/dL (ref 30.0–36.0)
MCV: 95.2 fL (ref 80.0–100.0)
Platelets: 35 10*3/uL — ABNORMAL LOW (ref 150–400)
RBC: 2.27 MIL/uL — ABNORMAL LOW (ref 3.87–5.11)
RDW: 17.4 % — ABNORMAL HIGH (ref 11.5–15.5)
WBC: 4.8 10*3/uL (ref 4.0–10.5)
nRBC: 0 % (ref 0.0–0.2)

## 2023-07-24 LAB — POTASSIUM
Potassium: 4.4 mmol/L (ref 3.5–5.1)
Potassium: 4.3 mmol/L (ref 3.5–5.1)
Potassium: 4.2 mmol/L (ref 3.5–5.1)
Potassium: 4.5 mmol/L (ref 3.5–5.1)
Potassium: 4.2 mmol/L (ref 3.5–5.1)

## 2023-07-24 LAB — TYPE AND SCREEN
ABO/RH(D): O NEG
Antibody Screen: NEGATIVE
Unit division: 0

## 2023-07-24 LAB — GLUCOSE, CAPILLARY: Glucose-Capillary: 129 mg/dL — ABNORMAL HIGH (ref 70–99)

## 2023-07-24 LAB — AEROBIC CULTURE W GRAM STAIN (SUPERFICIAL SPECIMEN)
Culture: NO GROWTH
Special Requests: NORMAL

## 2023-07-24 LAB — BPAM RBC
Blood Product Expiration Date: 202410062359
ISSUE DATE / TIME: 202409210816
Unit Type and Rh: 9500

## 2023-07-24 LAB — PHOSPHORUS: Phosphorus: 9.3 mg/dL — ABNORMAL HIGH (ref 2.5–4.6)

## 2023-07-24 MED ORDER — FUROSEMIDE 10 MG/ML IJ SOLN
100.0000 mg | Freq: Once | INTRAVENOUS | Status: AC
  Administered 2023-07-24: 100 mg via INTRAVENOUS
  Filled 2023-07-24: qty 10

## 2023-07-24 NOTE — Progress Notes (Signed)
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 Relatively bland urine sediment, no obstruction on imaging. Has been adequately volume resuscitated since she's been admitted (now hypervolemic). Seems to now be in ATN unfortunately -c/w octreotide and levophed, stopped midodrine given bradycardia.  -she is not a liver transplant candidate (last drink was only a few weeks ago and her UDS was +cocaine and now with sepsis). Therefore, would recommend against consideration of CRRT/HD but family has to make final decision. Awaiting palliative care meeting with family to determine GOC -will give lasix 100mg  IV x 1 today -today's labs pending (ordered CMP, phos) -Avoid nephrotoxic medications including NSAIDs and iodinated intravenous contrast exposure unless the latter is absolutely indicated.  Preferred narcotic agents for pain control are hydromorphone, fentanyl, and methadone. Morphine should not be used. Avoid Baclofen and avoid oral sodium phosphate and magnesium citrate based laxatives / bowel preps. Continue strict Input and Output monitoring. Will monitor the patient closely with you and intervene or adjust therapy as indicated by changes in clinical status/labs    EtOH Cirrhosis -GI following -HRS plan as above  SVT -cardiology following. Now bradycardic  Pulmonary edema, acute on chronic diastolic CHF exacerbation -worsened pulm edema on CXR 9/19, will give lasix 100mg  IV x 1 today and reassess from there  Hyperkalemia -improved, cont to monitor   Urinary retention -currently with foley   ABLA, hematoma Pancytopenia -transfuse PRN for hgb <7 -per primary   Shock, c/f septic shock H/o HTN -pressor support & abx per primary service   Metabolic acidosis -stable -likely secondary to AKI and decreased lactate clearance from cirrhosis  Cocaine positive -will defer to primary  service  Prognosis is grim given above reasons.  Erika Sar, MD [facility]  Subjective:   Patient seen and examined bedside. Uop ~0.8L (received lasix 100mg  bid yesterday) Worsened neuro status, CTH overnight neg. Pressors: levophed Labs pending but K is now WNL She is awake but not really interactive   Objective:   BP (!) 78/66   Pulse (!) 46   Temp 97.6 F (36.4 C) (Axillary)   Resp 19   Ht 5\' 1"  (1.549 m)   Wt 69.8 kg   SpO2 95%   BMI 29.08 kg/m   Intake/Output Summary (Last 24 hours) at 07/24/2023 0935 Last data filed at 07/24/2023 0102 Gross per 24 hour  Intake 2870.58 ml  Output 2025 ml  Net 845.58 ml   Weight change:   Physical Exam: Gen: ill appearing CVS: bradycardic Resp: fine crackles bibasilar Abd: distended, NT Ext: 1+ pitting edema LLE>RLE Neuro: awake but not following commands, grimaces to pain  Imaging: CT HEAD WO CONTRAST ( )  Result Date: 07/23/2023 CLINICAL DATA:  Anisocoria, encephalopathy EXAM: CT HEAD WITHOUT CONTRAST TECHNIQUE: Contiguous axial images were obtained from the base of the skull through the vertex without intravenous contrast. RADIATION DOSE REDUCTION: This exam was performed according to the departmental dose-optimization program which includes automated exposure control, adjustment of the mA and/or kV according to patient size and/or use of iterative reconstruction technique. COMPARISON:  07/15/2023 FINDINGS: Brain: No evidence of acute infarction, hemorrhage, mass, mass effect, or midline shift. No hydrocephalus or extra-axial fluid collection. Periventricular white matter changes, likely the sequela of chronic small vessel ischemic disease. Vascular: No hyperdense vessel. Atherosclerotic calcifications in the intracranial carotid and vertebral arteries. Skull: Negative for fracture or focal lesion. Sinuses/Orbits: No acute finding. Other:  Fluid in the bilateral mastoid air cells. IMPRESSION: No acute intracranial  process. Electronically Signed   By: Wiliam Ke M.D.   On: 07/23/2023 21:56    Labs: BMET Recent Labs  Lab 07/07/2023 0746 07/19/23 1610 07/20/23 0901 07/21/23 9604 07/22/23 0252 07/23/23 0308 07/23/23 5409 07/23/23 0821 07/23/23 1057 07/23/23 1542 07/23/23 2100 07/24/23 0113 07/24/23 0341  NA 135 134* 136 138 139 138 141  --   --   --   --   --   --   K 4.2 4.7 4.7 5.1 4.8 5.2* 4.9 6.5* 5.1 4.6 4.4 4.4 4.3  CL 104 98 99 102 99 99 95*  --   --   --   --   --   --   CO2 20* 23 20* 20* 21* 20* 19*  --   --   --   --   --   --   GLUCOSE 74 72 122* 151* 184* 175* 170*  --   --   --   --   --   --   BUN 76* 75* 83* 95* 104* 120* 125*  --   --   --   --   --   --   CREATININE 3.01* 3.20* 3.21* 3.28* 3.53* 4.12* 4.22*  --   --   --   --   --   --   CALCIUM 6.9* 6.4* 7.2* 7.6* 7.9* 8.0* 8.1*  --   --   --   --   --   --   PHOS  --   --  8.1* 9.0*  --  9.6*  --   --   --   --   --   --   --    CBC Recent Labs  Lab 07/19/23 0658 07/20/23 0901 07/21/23 0828 07/21/23 1914 07/22/23 0252 07/23/23 0308 07/23/23 1220 07/24/23 0341  WBC 10.4   < > 13.1* 13.2* 12.2* 9.5  --  4.8  NEUTROABS 9.0*  --  11.2*  --  10.0*  --   --   --   HGB 8.4*   < > 8.4* 7.6* 7.3* 6.3* 7.4* 7.0*  HCT 24.7*   < > 25.7* 23.0* 22.7* 19.6* 21.9* 21.6*  MCV 90.1   < > 93.1 93.9 96.2 98.5  --  95.2  PLT 34*   < > 40* 55* 51* 51*  --  35*   < > = values in this interval not displayed.    Medications:     Chlorhexidine Gluconate Cloth  6 each Topical Daily   dextrose  50 mL Intravenous Once   feeding supplement  237 mL Oral BID BM   FLUoxetine  10 mg Oral Daily   insulin aspart  5 Units Subcutaneous Once   lactulose  300 mL Rectal BID   midodrine  10 mg Oral Once   multivitamin with minerals  1 tablet Oral Daily   nicotine  7 mg Transdermal Daily   octreotide  200 mcg Subcutaneous Q12H   mouth rinse  15 mL Mouth Rinse 4 times per day   pantoprazole (PROTONIX) IV  40 mg Intravenous Q12H   rifaximin   550 mg Oral BID   sodium bicarbonate  50 mEq Intravenous Once   thiamine (VITAMIN B1) injection  100 mg Intravenous Q24H      Erika Sar, MD [facility] 07/24/2023, 9:35 AM

## 2023-07-24 NOTE — Progress Notes (Signed)
Daily Progress Note   Patient Name: Erika [name]       Date: 07/24/2023 DOB: October 12, 1956  Age: 67 y.o. MRN#: [id number] Attending Physician: Tomma Lightning, MD Primary Care Physician: Claiborne Rigg, NP Admit Date: 07/27/2023  Reason for Consultation/Follow-up: Establishing goals of care   Length of Stay: 8  Current Medications: Scheduled Meds:   Chlorhexidine Gluconate Cloth  6 each Topical Daily   dextrose  50 mL Intravenous Once   feeding supplement  237 mL Oral BID BM   FLUoxetine  10 mg Oral Daily   insulin aspart  5 Units Subcutaneous Once   lactulose  300 mL Rectal BID   midodrine  10 mg Oral Once   multivitamin with minerals  1 tablet Oral Daily   nicotine  7 mg Transdermal Daily   octreotide  200 mcg Subcutaneous Q12H   mouth rinse  15 mL Mouth Rinse 4 times per day   pantoprazole (PROTONIX) IV  40 mg Intravenous Q12H   rifaximin  550 mg Oral BID   sodium bicarbonate  50 mEq Intravenous Once   thiamine (VITAMIN B1) injection  100 mg Intravenous Q24H    Continuous Infusions:  sodium chloride Stopped (07/21/23 1954)   sodium chloride 10 mL/hr at 07/24/23 1144   ceFEPime (MAXIPIME) IV Stopped (07/23/23 1549)   dexmedetomidine (PRECEDEX) IV infusion Stopped (07/24/23 0721)   linezolid (ZYVOX) IV Stopped (07/24/23 0915)   norepinephrine (LEVOPHED) Adult infusion 2 mcg/min (07/24/23 1135)    PRN Meds: sodium chloride, HYDROmorphone (DILAUDID) injection, mouth rinse  Physical Exam Constitutional:      General: She is awake.     Appearance: She is ill-appearing.     Interventions: Nasal cannula in place.  HENT:     Head: Normocephalic and atraumatic.  Cardiovascular:     Rate and Rhythm: Bradycardia present.  Pulmonary:     Effort: Pulmonary effort is  normal.  Skin:    General: Skin is warm and dry.             Vital Signs: BP 93/61   Pulse (!) 55   Temp 97.6 F (36.4 C) (Axillary)   Resp 16   Ht 5\' 1"  (1.549 m)   Wt 69.8 kg   SpO2 96%   BMI 29.08 kg/m  SpO2: SpO2: 96 %  O2 Device: O2 Device: Nasal Cannula (salter) O2 Flow Rate: O2 Flow Rate (L/min): 6 L/min       Patient Active Problem List   Diagnosis Date Noted   Gastric ulcer with hemorrhage 12-Aug-2023   Duodenal ulcer disease 08/12/23   AKI (acute kidney injury) (HCC) 08/12/2023   Secondary esophageal varices without bleeding (HCC) 08-12-23   Alcoholic cirrhosis of liver with ascites (HCC) 08-12-2023   Blood loss anemia 07/13/2023   Prolonged QT interval 07/09/2023   Cardiomegaly 07/19/2023   Occult blood positive stool 07/24/2023   Acute encephalopathy 07/11/2023   Bipolar 2 disorder (HCC) 05/11/2023   Psychophysiological insomnia 05/11/2023   Alcohol use    Anxiety    S/P foot surgery, left    Osteomyelitis (HCC) 07/10/2021   Pancytopenia (HCC) 07/10/2021   Hepatic steatosis 07/10/2021   Osteomyelitis of fifth toe of right foot (HCC) 04/23/2021   Alcohol use disorder 04/23/2021   Hepatomegaly 04/23/2021   Thrombocytopenia (HCC) 04/23/2021   GERD (gastroesophageal reflux disease) 04/23/2021   Tobacco abuse 04/23/2021   History of shingles 10/18/2018   Rheumatoid arthritis (HCC) 10/18/2018   Rash and nonspecific skin eruption 10/18/2018   Numbness and tingling 10/18/2018   Depression 10/18/2018    Palliative Care Assessment & Plan   Patient Profile: 67 y.o. female  with past medical history of rheumatoid arthritis, cirrhosis secondary to alcohol abuse, bipolar disorder, history of portal hypertension, GERD, HTN, chronic pain on lyrica admitted on 07/17/2023 with left hip swelling post fall one week prior. She was admitted for acute blood loss anemia due to left hip hematoma and heme positive stool.    She has since developed altered mental status,  hypotension, and bradycardia and is in the ICU.  Assessment: Patient was more alert today than yesterday but seemed agitated. Her granddaughters are at bedside and son [name] is on video chat. Dean told the family to come see the patient and this will be happening early this week. Answered questions about the patient's current condition and prognosis. [name] shares he has done more research on his mother's condition and understands the seriousness of her illness. Discussed that if patient's condition deteriorates team may reach out to confirm code status.  Today the family was open to discussing what a comfort path would look like for this patient. Shared that with comfort measures the patient would no longer receive aggressive medical interventions such as continuous vital signs, lab work, radiology testing, or medications not focused on comfort. All care would focus on how the patient is looking and feeling. This would include management of any symptoms that may cause discomfort, pain, shortness of breath, cough, nausea, agitation, anxiety, and/or secretions etc. Symptoms would be managed with medications and other non-pharmacological interventions such as spiritual support if requested, repositioning, music therapy, or therapeutic listening. Family verbalized understanding and appreciation.    We discussed desire for spiritual care to help with resources for the patient's young granddaughter.  Recommendations/Plan: Full code Full scope Time for family to visit patient (Monday and Tuesday) Continued conversation between family re: goals of care Continued PMT support Spiritual care consult placed   Code Status:    Code Status Orders  (From admission, onward)           Start     Ordered   08/01/2023 2008  Full code  Continuous       Question:  By:  Answer:  Consent: discussion documented in EHR   07/08/2023 2012  Extensive chart review has been completed prior to seeing the  patient and speaking with her son [name] and granddaughters including labs, vital signs, imagine, progress/consult notes, orders, medications, and available advance directive documents.  Care plan was discussed with bedside RN and Dr. Wynona Neat  Time spent: 35 minutes  Thank you for allowing the Palliative Medicine Team to assist in the care of this patient.    Sherryll Burger, NP  Please contact Palliative Medicine Team phone at [phone] for questions and concerns.

## 2023-07-24 NOTE — Plan of Care (Signed)
Problem: Health Behavior/Discharge Planning: Goal: Ability to manage health-related needs will improve Outcome: Not Progressing   Problem: Activity: Goal: Risk for activity intolerance will decrease Outcome: Not Progressing   Problem: Nutrition: Goal: Adequate nutrition will be maintained Outcome: Not Progressing   Problem: Coping: Goal: Level of anxiety will decrease Outcome: Not Progressing   Problem: Pain Managment: Goal: General experience of comfort will improve Outcome: Not Progressing   Problem: Skin Integrity: Goal: Risk for impaired skin integrity will decrease Outcome: Not Progressing   *GOC meeting expected this week.  Patient confused, disoriented x4, restless, and on levo and precedex gtt.  Patient NPO.

## 2023-07-24 NOTE — Progress Notes (Addendum)
NAME:  Erika [name], MRN:  [id number], DOB:  1955/12/18, LOS: 8 ADMISSION DATE:  07/10/2023, CONSULTATION DATE:  07/21/23 REFERRING MD:  Grier Mitts., MD CHIEF COMPLAINT:  Hypotension/AMS   History of Present Illness:  Erika [name] is a 67 y.o. female with a hx of rheumatoid arthritis, cirrhosis secondary to alcohol abuse, bipolar disorder, history of portal hypertension, GERD, HTN, chronic pain on lyrica who was admitted 9/14 for swelling of her left hip after suffering a fall the week before. She was admitted for acute blood loss anemia due to left hip hematoma and heme positive stool.   Today she developed runs of SVT treated with beta blockers. She developed hypotension and altered mental status with bradycardia this afternoon in which PCCM was consulted.   Nephrology is following for AKI in setting of hepato renal syndrome. Gi has been following for cirrhosis and esophageal varices and portal hypertension. She has been on lactulose and rifaximin for hepatic encephalopathy.  Pertinent  Medical History   Past Medical History:  Diagnosis Date   Arthritis    Cirrhosis (HCC)    Depression    GERD (gastroesophageal reflux disease)    History of shingles 10/18/2018   Hypertension    Rheumatoid arthritis(714.0)    Significant Hospital Events: Including procedures, antibiotic start and stop dates in addition to other pertinent events   9/14 admitted 9/19 Altered mental status, hypotension and bradycardia, transferred to ICU. Unable to obtain fluid for paracentesis 9/21 goals of care discussions >> family want to continue aggressive care  Interim History / Subjective:  Levophed resumed 9/22 am for low BP Encephalopathic R arm Cx positive for few staph aureus> current ABX should cover>> susceptibilities pending    Objective   Blood pressure (!) 86/51, pulse (!) 45, temperature 97.6 F (36.4 C), temperature source Axillary, resp. rate 13, height 5\' 1"  (1.549 m), weight  69.8 kg, SpO2 99%.    FiO2 (%):  [50 %-52 %] 50 %   Intake/Output Summary (Last 24 hours) at 07/24/2023 1020 Last data filed at 07/24/2023 1019 Gross per 24 hour  Intake 2933.95 ml  Output 2360 ml  Net 573.95 ml   Filed Weights   07/12/2023 1328 07/22/23 0500  Weight: 65.8 kg 69.8 kg    Examination: General: Chronically ill-appearing, jaundiced, encephalopathic HENT: Anicteric, dry oral mucosa, No LAD,+ JVD, PERRLA Lungs: Bilateral chest excursion , Few Rhonchi Cardiovascular: S1-S2 , No RMG, SB per tele Abdomen: Soft, bowel sounds appreciated Extremities: Ecchymosis left leg, brisk refill Neuro: encephalopathic, MAE x 4 but not to command  GU: Foley   Labs reviewed 9/22 WBC 4.8/ HGB 7/Platelets 35 K Potassium 4.1 CMP in process>> will review when available  Resolved Hospital Problem list     Assessment & Plan:   Septic shock Metabolic encephalopathy Possible pneumonia Hypotension  -No growth on blood cultures>> still pending  -few staph aureus + right arm wound culture>> current ABX therapy covers -No significant fluid for paracentesis - Levophed to maintain BP MAP > 65 mm HG -Continue empiric antibiotics cefepime and linezolid  Alcoholic cirrhosis Portal hypertension Esophageal varices Metabolic encephalopathy/hepatic encephalopathy/hepatorenal syndrome -S/p EGD 916 with no overt bleeding -Continue lactulose enemas -Unable to take p.o. because of altered mental status -Hold on placing NG  Acute kidney injury Hepatorenal syndrome -Continue Octreotide -Trend BMET -Appreciate nephrology input -Maintain MAP greater than 65  Anemia Thrombocytopenia - trend CBC,  - transfuse for HGB < 7  Acute on chronic diastolic heart failure Pulmonary  edema  Paroxysmal atrial fibrillation SVT -Beta-blockers on hold - Lasix gtt per renal started 9/22  Peptic ulcer disease - PPI BID x 8 weeks per GI  Goals of care discussion with family 9/21>> Plan to continue  aggressive care , Code status remains full Code  Best Practice (right click and "Reselect all SmartList Selections" daily)   Diet/type: NPO DVT prophylaxis: not indicated GI prophylaxis: PPI Lines: N/A Foley:  Yes, and it is still needed Code Status:  full code Last date of multidisciplinary goals of care discussion [9/21 with son]  Labs   CBC: Recent Labs  Lab 07/19/23 0658 07/20/23 0901 07/21/23 0828 07/21/23 1914 07/22/23 0252 07/23/23 0308 07/23/23 1220 07/24/23 0341  WBC 10.4   < > 13.1* 13.2* 12.2* 9.5  --  4.8  NEUTROABS 9.0*  --  11.2*  --  10.0*  --   --   --   HGB 8.4*   < > 8.4* 7.6* 7.3* 6.3* 7.4* 7.0*  HCT 24.7*   < > 25.7* 23.0* 22.7* 19.6* 21.9* 21.6*  MCV 90.1   < > 93.1 93.9 96.2 98.5  --  95.2  PLT 34*   < > 40* 55* 51* 51*  --  35*   < > = values in this interval not displayed.    Basic Metabolic Panel: Recent Labs  Lab 07/20/23 0901 07/21/23 0828 07/22/23 0252 07/23/23 0308 07/23/23 4259 07/23/23 0821 07/23/23 1057 07/23/23 1542 07/23/23 2100 07/24/23 0113 07/24/23 0341  NA 136 138 139 138 141  --   --   --   --   --   --   K 4.7 5.1 4.8 5.2* 4.9   < > 5.1 4.6 4.4 4.4 4.3  CL 99 102 99 99 95*  --   --   --   --   --   --   CO2 20* 20* 21* 20* 19*  --   --   --   --   --   --   GLUCOSE 122* 151* 184* 175* 170*  --   --   --   --   --   --   BUN 83* 95* 104* 120* 125*  --   --   --   --   --   --   CREATININE 3.21* 3.28* 3.53* 4.12* 4.22*  --   --   --   --   --   --   CALCIUM 7.2* 7.6* 7.9* 8.0* 8.1*  --   --   --   --   --   --   MG  --  2.2  --   --   --   --   --   --   --   --   --   PHOS 8.1* 9.0*  --  9.6*  --   --   --   --   --   --   --    < > = values in this interval not displayed.   GFR: Estimated Creatinine Clearance: 11.6 mL/min (A) (by C-G formula based on SCr of 4.22 mg/dL (H)). Recent Labs  Lab 07/21/23 1913 07/21/23 1914 07/21/23 2045 07/22/23 0252 07/22/23 0643 07/22/23 0938 07/23/23 0308 07/24/23 0341  WBC   --  13.2*  --  12.2*  --   --  9.5 4.8  LATICACIDVEN 3.2*  --  3.4*  --  2.5* 2.5*  --   --     Liver Function  Tests: Recent Labs  Lab 07/19/23 0658 07/20/23 0901 07/21/23 0828 07/22/23 0252 07/23/23 0308  AST 31  --  47* 49* 60*  ALT 12  --  16 15 17   ALKPHOS 79  --  77 63 46  BILITOT 5.1*  --  7.4* 7.9* 7.5*  PROT 5.5*  --  6.1* 7.0 6.9  ALBUMIN 1.6* 2.2* 2.0* 3.8 4.0   No results for input(s): "LIPASE", "AMYLASE" in the last 168 hours. Recent Labs  Lab 07/17/23 1233 07/20/23 1448 07/21/23 0828  AMMONIA 112* 84* 84*    ABG    Component Value Date/Time   PHART 7.35 07/21/2023 1726   PCO2ART 44 07/21/2023 1726   PO2ART 90 07/21/2023 1726   HCO3 24.3 07/21/2023 1726   ACIDBASEDEF 1.5 07/21/2023 1726   O2SAT 97.5 07/21/2023 1726     Coagulation Profile: Recent Labs  Lab 07/07/2023 0746 07/23/23 0308  INR 1.7* 2.2*    Cardiac Enzymes: No results for input(s): "CKTOTAL", "CKMB", "CKMBINDEX", "TROPONINI" in the last 168 hours.   HbA1C: Hgb A1c MFr Bld  Date/Time Value Ref Range Status  04/24/2021 12:46 AM 5.6 4.8 - 5.6 % Final    Comment:    (NOTE) Pre diabetes:          5.7%-6.4%  Diabetes:              >6.4%  Glycemic control for   <7.0% adults with diabetes     CBG: Recent Labs  Lab 07/21/23 2313 07/22/23 0317 07/22/23 0733 07/22/23 1118 07/23/23 0455  GLUCAP 139* 175* 130* 162* 145*    Allergies No Known Allergies   Home Medications  Prior to Admission medications   Medication Sig Start Date End Date Taking? Authorizing Provider  FLUoxetine (PROZAC) 20 MG capsule Take 1 capsule (20 mg total) by mouth daily. 07/08/23  Yes Claiborne Rigg, NP  lamoTRIgine (LAMICTAL) 100 MG tablet Take 1 tablet (100 mg total) by mouth 2 (two) times daily. 07/08/23  Yes Claiborne Rigg, NP  pregabalin (LYRICA) 100 MG capsule Take 1 capsule (100 mg total) by mouth 2 (two) times daily. 07/08/23  Yes Claiborne Rigg, NP  traZODone (DESYREL) 100 MG tablet Take 1  tablet (100 mg total) by mouth at bedtime. 07/08/23  Yes Claiborne Rigg, NP  venlafaxine XR (EFFEXOR-XR) 150 MG 24 hr capsule Take 1 capsule (150 mg total) by mouth daily with breakfast. 07/08/23  Yes Claiborne Rigg, NP  ARIPiprazole (ABILIFY) 5 MG tablet Take 5 mg by mouth daily. 07/11/23   [provider]  thiamine (VITAMIN B1) 100 MG tablet Take 1 tablet (100 mg total) by mouth daily. Patient not taking: Reported on 07/09/2023 07/08/23   Claiborne Rigg, NP     Critical Care APP time 35 minutes Bevelyn Ngo, MSN, AGACNP-BC [facility] Pulmonary/Critical Care Medicine See Amion for personal pager PCCM on call pager [phone]  07/24/2023 10:45 AM

## 2023-07-25 ENCOUNTER — Inpatient Hospital Stay (HOSPITAL_COMMUNITY): Payer: 59

## 2023-07-25 DIAGNOSIS — G9340 Encephalopathy, unspecified: Secondary | ICD-10-CM | POA: Diagnosis not present

## 2023-07-25 DIAGNOSIS — D500 Iron deficiency anemia secondary to blood loss (chronic): Secondary | ICD-10-CM | POA: Diagnosis not present

## 2023-07-25 DIAGNOSIS — Z515 Encounter for palliative care: Secondary | ICD-10-CM | POA: Diagnosis not present

## 2023-07-25 LAB — CBC
HCT: 22.2 % — ABNORMAL LOW (ref 36.0–46.0)
Hemoglobin: 7.2 g/dL — ABNORMAL LOW (ref 12.0–15.0)
MCH: 31 pg (ref 26.0–34.0)
MCHC: 32.4 g/dL (ref 30.0–36.0)
MCV: 95.7 fL (ref 80.0–100.0)
Platelets: 30 10*3/uL — ABNORMAL LOW (ref 150–400)
RBC: 2.32 MIL/uL — ABNORMAL LOW (ref 3.87–5.11)
RDW: 17 % — ABNORMAL HIGH (ref 11.5–15.5)
WBC: 5.7 10*3/uL (ref 4.0–10.5)
nRBC: 0 % (ref 0.0–0.2)

## 2023-07-25 LAB — GLUCOSE, CAPILLARY
Glucose-Capillary: 129 mg/dL — ABNORMAL HIGH (ref 70–99)
Glucose-Capillary: 200 mg/dL — ABNORMAL HIGH (ref 70–99)

## 2023-07-25 MED ORDER — PROSOURCE TF20 ENFIT COMPATIBL EN LIQD
60.0000 mL | Freq: Two times a day (BID) | ENTERAL | Status: DC
  Administered 2023-07-25 – 2023-07-29 (×8): 60 mL
  Filled 2023-07-25 (×8): qty 60

## 2023-07-25 MED ORDER — FLUOXETINE HCL 10 MG PO CAPS
10.0000 mg | ORAL_CAPSULE | Freq: Every day | ORAL | Status: DC
  Administered 2023-07-26 – 2023-07-29 (×4): 10 mg
  Filled 2023-07-25 (×4): qty 1

## 2023-07-25 MED ORDER — LACTULOSE 10 GM/15ML PO SOLN
30.0000 g | ORAL | Status: DC
  Administered 2023-07-25 – 2023-07-27 (×10): 30 g
  Filled 2023-07-25 (×10): qty 45

## 2023-07-25 MED ORDER — ADULT MULTIVITAMIN W/MINERALS CH
1.0000 | ORAL_TABLET | Freq: Every day | ORAL | Status: DC
  Administered 2023-07-26 – 2023-07-29 (×4): 1
  Filled 2023-07-25 (×4): qty 1

## 2023-07-25 MED ORDER — OSMOLITE 1.5 CAL PO LIQD
1000.0000 mL | ORAL | Status: DC
  Administered 2023-07-25 – 2023-07-29 (×4): 1000 mL
  Filled 2023-07-25 (×4): qty 1000

## 2023-07-25 MED ORDER — RIFAXIMIN 550 MG PO TABS
550.0000 mg | ORAL_TABLET | Freq: Two times a day (BID) | ORAL | Status: DC
  Administered 2023-07-25 – 2023-07-29 (×8): 550 mg
  Filled 2023-07-25 (×8): qty 1

## 2023-07-25 MED ORDER — LACTULOSE 10 GM/15ML PO SOLN: 30.0000 g | ORAL | Status: DC

## 2023-07-25 MED ORDER — MIDODRINE HCL 5 MG PO TABS
10.0000 mg | ORAL_TABLET | Freq: Three times a day (TID) | ORAL | Status: DC
  Administered 2023-07-25: 10 mg
  Filled 2023-07-25: qty 2

## 2023-07-25 MED ORDER — CEFAZOLIN SODIUM-DEXTROSE 2-4 GM/100ML-% IV SOLN
2.0000 g | Freq: Two times a day (BID) | INTRAVENOUS | Status: DC
  Administered 2023-07-25 – 2023-07-26 (×4): 2 g via INTRAVENOUS
  Filled 2023-07-25 (×5): qty 100

## 2023-07-25 NOTE — Progress Notes (Signed)
Daily Progress Note   Patient Name: Erika [name]       Date: 07/25/2023 DOB: 08/05/1956  Age: 67 y.o. MRN#: [id number] Attending Physician: Kalman Shan, MD Primary Care Physician: Claiborne Rigg, NP Admit Date: August 12, 2023  Reason for Consultation/Follow-up: Establishing goals of care   Length of Stay: 9  Current Medications: Scheduled Meds:   Chlorhexidine Gluconate Cloth  6 each Topical Daily   dextrose  50 mL Intravenous Once   feeding supplement  237 mL Oral BID BM   FLUoxetine  10 mg Oral Daily   insulin aspart  5 Units Subcutaneous Once   lactulose  300 mL Rectal BID   midodrine  10 mg Oral Once   multivitamin with minerals  1 tablet Oral Daily   nicotine  7 mg Transdermal Daily   octreotide  200 mcg Subcutaneous Q12H   mouth rinse  15 mL Mouth Rinse 4 times per day   pantoprazole (PROTONIX) IV  40 mg Intravenous Q12H   rifaximin  550 mg Oral BID   sodium bicarbonate  50 mEq Intravenous Once   thiamine (VITAMIN B1) injection  100 mg Intravenous Q24H    Continuous Infusions:  sodium chloride Stopped (07/21/23 1954)   sodium chloride 10 mL/hr at 07/25/23 0800   ceFEPime (MAXIPIME) IV Stopped (07/24/23 1612)   dexmedetomidine (PRECEDEX) IV infusion 0.4 mcg/kg/hr (07/25/23 0800)   linezolid (ZYVOX) IV 600 mg (07/25/23 0933)   norepinephrine (LEVOPHED) Adult infusion Stopped (07/25/23 0018)    PRN Meds: sodium chloride, HYDROmorphone (DILAUDID) injection, mouth rinse  Physical Exam Vitals reviewed.  Constitutional:      General: She is awake.     Appearance: She is ill-appearing.     Comments: HFNC  HENT:     Head: Normocephalic and atraumatic.  Cardiovascular:     Rate and Rhythm: Bradycardia present.  Pulmonary:     Effort: Pulmonary effort is  normal.  Skin:    Coloration: Skin is jaundiced.             Vital Signs: BP (!) 102/52   Pulse (!) 54   Temp 98.8 F (37.1 C) (Axillary)   Resp 16   Ht 5\' 1"  (1.549 m)   Wt 69.8 kg   SpO2 96%   BMI 29.08 kg/m  SpO2: SpO2: 96 % O2  Device: O2 Device: Heated High Flow Nasal Cannula O2 Flow Rate: O2 Flow Rate (L/min): 20 L/min       Patient Active Problem List   Diagnosis Date Noted   Gastric ulcer with hemorrhage 07/19/2023   Duodenal ulcer disease 07/23/2023   AKI (acute kidney injury) (HCC) 07/05/2023   Secondary esophageal varices without bleeding (HCC) 07/15/2023   Alcoholic cirrhosis of liver with ascites (HCC) 07/23/2023   Blood loss anemia 07/04/2023   Prolonged QT interval 29-Jul-2023   Cardiomegaly 07/26/2023   Occult blood positive stool 07/23/2023   Acute encephalopathy 07/31/2023   Bipolar 2 disorder (HCC) 05/11/2023   Psychophysiological insomnia 05/11/2023   Alcohol use    Anxiety    S/P foot surgery, left    Osteomyelitis (HCC) 07/10/2021   Pancytopenia (HCC) 07/10/2021   Hepatic steatosis 07/10/2021   Osteomyelitis of fifth toe of right foot (HCC) 04/23/2021   Alcohol use disorder 04/23/2021   Hepatomegaly 04/23/2021   Thrombocytopenia (HCC) 04/23/2021   GERD (gastroesophageal reflux disease) 04/23/2021   Tobacco abuse 04/23/2021   History of shingles 10/18/2018   Rheumatoid arthritis (HCC) 10/18/2018   Rash and nonspecific skin eruption 10/18/2018   Numbness and tingling 10/18/2018   Depression 10/18/2018    Palliative Care Assessment & Plan   Patient Profile: 67 y.o. female  with past medical history of rheumatoid arthritis, cirrhosis secondary to alcohol abuse, bipolar disorder, history of portal hypertension, GERD, HTN, chronic pain on lyrica admitted on 07/20/2023 with left hip swelling post fall one week prior. She was admitted for acute blood loss anemia due to left hip hematoma and heme positive stool.    She has since developed altered  mental status, hypotension, and bradycardia and is in the ICU.  Assessment: Patient was having patient care. No family at bedside.  1150: Called son [name]. Update given. Plan remains to allow family to visit early this week. I told him I will have our chaplain follow up this week with information to support his daughter. He is Adult nurse. He did not have any further questions about what a comfort path will look like for his mother. PMT will follow up in a day or two.  Recommendations/Plan: Full code Full scope Time for family to visit patient Continued conversation between family re: goals of care Continued PMT support Spiritual care support   Code Status:    Code Status Orders  (From admission, onward)           Start     Ordered   07/22/2023 2008  Full code  Continuous       Question:  By:  Answer:  Consent: discussion documented in EHR   29-Jul-2023 2012           Extensive chart review has been completed prior to seeing the patient and speaking with her son [name] including labs, vital signs, imagine, progress/consult notes, orders, medications, and available advance directive documents.  Care plan was discussed with bedside RN and Dr. Wynona Neat  Time spent: 35 minutes  Thank you for allowing the Palliative Medicine Team to assist in the care of this patient.    Sherryll Burger, NP  Please contact Palliative Medicine Team phone at [phone] for questions and concerns.

## 2023-07-25 NOTE — Progress Notes (Addendum)
NAME:  Erika [name], MRN:  [id number], DOB:  September 16, 1956, LOS: 9 ADMISSION DATE:  07/27/2023, CONSULTATION DATE:  07/21/23 REFERRING MD:  Grier Mitts., MD CHIEF COMPLAINT:  Hypotension/AMS   History of Present Illness:  Erika [name] is a 67 y.o. female with a hx of rheumatoid arthritis, cirrhosis secondary to alcohol abuse, bipolar disorder, history of portal hypertension, GERD, HTN, chronic pain on lyrica who was admitted 9/14 for swelling of her left hip after suffering a fall the week before. She was admitted for acute blood loss anemia due to left hip hematoma and heme positive stool.   She developed runs of SVT treated with beta blockers. She developed hypotension and altered mental status with bradycardia which prompted PCCM consult.   Nephrology is following for AKI in setting of hepato renal syndrome. GI has been following for cirrhosis and esophageal varices and portal hypertension. She has been on lactulose and rifaximin for hepatic encephalopathy.  Pertinent  Medical History   Past Medical History:  Diagnosis Date   Arthritis    Cirrhosis (HCC)    Depression    GERD (gastroesophageal reflux disease)    History of shingles 10/18/2018   Hypertension    Rheumatoid arthritis(714.0)    Significant Hospital Events: Including procedures, antibiotic start and stop dates in addition to other pertinent events   9/14 admitted 9/19 Altered mental status, hypotension and bradycardia, transferred to ICU. Unable to obtain fluid for paracentesis 9/21 goals of care discussions >> family want to continue aggressive care  Interim History / Subjective:  Levophed Dc'd 9/22 Encephalopathic R arm Cx positive for few staph aureus> current ABX sensitive   Objective   Blood pressure (!) 105/39, pulse (!) 55, temperature 98.5 F (36.9 C), temperature source Axillary, resp. rate 20, height 5\' 1"  (1.549 m), weight 69.8 kg, SpO2 93%.    FiO2 (%):  [55 %-95 %] 55 %    Intake/Output Summary (Last 24 hours) at 07/25/2023 1312 Last data filed at 07/25/2023 1300 Gross per 24 hour  Intake 3277.88 ml  Output 2785 ml  Net 492.88 ml   Filed Weights   07/24/2023 1328 07/22/23 0500  Weight: 65.8 kg 69.8 kg    Examination: General: Chronically ill-appearing, jaundiced, encephalopathic HENT: Anicteric, dry oral mucosa, No LAD,+ JVD, PERRLA Lungs: Bilateral chest excursion , Few Rhonchi Cardiovascular: S1-S2 , No RMG, SB per tele Abdomen: Soft, bowel sounds appreciated Extremities: Ecchymosis left leg, brisk refill Neuro: encephalopathic, MAE x 4 but not to command  GU: Foley   Labs reviewed 9/22 WBC 5.7/ HGB 7.2/Platelets 30 K Potassium 4.2  Resolved Hospital Problem list     Assessment & Plan:   Septic shock Metabolic encephalopathy Possible pneumonia Hypotension  -No growth on blood cultures>> still pending  -few staph aureus + right arm wound culture>> current ABX therapy covers -Deescalate ABX to cefazolin, DC linezolid and cefepime  -No significant fluid for paracentesis - Levophed to maintain BP MAP > 60 mm HG and SBP >100  Alcoholic cirrhosis Portal hypertension Esophageal varices Metabolic encephalopathy/hepatic encephalopathy/hepatorenal syndrome -S/p EGD 916 with no overt bleeding -Continue lactulose enemas -Unable to take p.o. because of altered mental status -Hold on placing NG  Acute kidney injury Hepatorenal syndrome -Continue Octreotide -Trend BMET -Appreciate nephrology input -Maintain MAP >60, SBP >100  Anemia Thrombocytopenia - trend CBC,  - transfuse for HGB < 7  Acute on chronic diastolic heart failure Pulmonary edema  Paroxysmal atrial fibrillation SVT -Beta-blockers on hold - Lasix gtt per  renal started 9/22  Peptic ulcer disease - PPI BID x 8 weeks per GI  Goals of care discussion with family 9/21>> Plan to continue aggressive care , Code status remains full Code  Best Practice (right click and  "Reselect all SmartList Selections" daily)   Diet/type: NPO DVT prophylaxis: not indicated GI prophylaxis: PPI Lines: N/A Foley:  Yes, and it is still needed Code Status:  full code Last date of multidisciplinary goals of care discussion [9/21 with son]  Labs   CBC: Recent Labs  Lab 07/19/23 0658 07/20/23 0901 07/21/23 0828 07/21/23 1914 07/22/23 0252 07/23/23 0308 07/23/23 1220 07/24/23 0341 07/25/23 0432  WBC 10.4   < > 13.1* 13.2* 12.2* 9.5  --  4.8 5.7  NEUTROABS 9.0*  --  11.2*  --  10.0*  --   --   --   --   HGB 8.4*   < > 8.4* 7.6* 7.3* 6.3* 7.4* 7.0* 7.2*  HCT 24.7*   < > 25.7* 23.0* 22.7* 19.6* 21.9* 21.6* 22.2*  MCV 90.1   < > 93.1 93.9 96.2 98.5  --  95.2 95.7  PLT 34*   < > 40* 55* 51* 51*  --  35* 30*   < > = values in this interval not displayed.    Basic Metabolic Panel: Recent Labs  Lab 07/20/23 0901 07/21/23 0828 07/22/23 0252 07/23/23 0308 07/23/23 4034 07/23/23 0821 07/24/23 0341 07/24/23 1001 07/24/23 1228 07/24/23 1751 07/24/23 2246  NA 136 138 139 138 141  --   --  138  --   --   --   K 4.7 5.1 4.8 5.2* 4.9   < > 4.3 4.4 4.2 4.5 4.2  CL 99 102 99 99 95*  --   --  103  --   --   --   CO2 20* 20* 21* 20* 19*  --   --  19*  --   --   --   GLUCOSE 122* 151* 184* 175* 170*  --   --  175*  --   --   --   BUN 83* 95* 104* 120* 125*  --   --  133*  --   --   --   CREATININE 3.21* 3.28* 3.53* 4.12* 4.22*  --   --  4.07*  --   --   --   CALCIUM 7.2* 7.6* 7.9* 8.0* 8.1*  --   --  7.5*  --   --   --   MG  --  2.2  --   --   --   --   --   --   --   --   --   PHOS 8.1* 9.0*  --  9.6*  --   --   --  9.3*  --   --   --    < > = values in this interval not displayed.   GFR: Estimated Creatinine Clearance: 12 mL/min (A) (by C-G formula based on SCr of 4.07 mg/dL (H)). Recent Labs  Lab 07/21/23 1913 07/21/23 1914 07/21/23 2045 07/22/23 0252 07/22/23 7425 07/22/23 0938 07/23/23 0308 07/24/23 0341 07/25/23 0432  WBC  --    < >  --  12.2*  --    --  9.5 4.8 5.7  LATICACIDVEN 3.2*  --  3.4*  --  2.5* 2.5*  --   --   --    < > = values in this interval not displayed.    Liver  Function Tests: Recent Labs  Lab 07/19/23 0658 07/20/23 0901 07/21/23 0828 07/22/23 0252 07/23/23 0308 07/24/23 1001  AST 31  --  47* 49* 60* 85*  ALT 12  --  16 15 17 22   ALKPHOS 79  --  77 63 46 49  BILITOT 5.1*  --  7.4* 7.9* 7.5* 7.3*  PROT 5.5*  --  6.1* 7.0 6.9 6.2*  ALBUMIN 1.6* 2.2* 2.0* 3.8 4.0 3.1*   No results for input(s): "LIPASE", "AMYLASE" in the last 168 hours. Recent Labs  Lab 07/20/23 1448 07/21/23 0828  AMMONIA 84* 84*    ABG    Component Value Date/Time   PHART 7.35 07/21/2023 1726   PCO2ART 44 07/21/2023 1726   PO2ART 90 07/21/2023 1726   HCO3 24.3 07/21/2023 1726   ACIDBASEDEF 1.5 07/21/2023 1726   O2SAT 97.5 07/21/2023 1726     Coagulation Profile: Recent Labs  Lab 07/23/23 0308  INR 2.2*    Cardiac Enzymes: No results for input(s): "CKTOTAL", "CKMB", "CKMBINDEX", "TROPONINI" in the last 168 hours.   HbA1C: Hgb A1c MFr Bld  Date/Time Value Ref Range Status  04/24/2021 12:46 AM 5.6 4.8 - 5.6 % Final    Comment:    (NOTE) Pre diabetes:          5.7%-6.4%  Diabetes:              >6.4%  Glycemic control for   <7.0% adults with diabetes     CBG: Recent Labs  Lab 07/22/23 0733 07/22/23 1118 07/23/23 0455 07/24/23 1953 07/25/23 0340  GLUCAP 130* 162* 145* 129* 129*    Allergies No Known Allergies   Home Medications  Prior to Admission medications   Medication Sig Start Date End Date Taking? Authorizing Provider  FLUoxetine (PROZAC) 20 MG capsule Take 1 capsule (20 mg total) by mouth daily. 07/08/23  Yes Claiborne Rigg, NP  lamoTRIgine (LAMICTAL) 100 MG tablet Take 1 tablet (100 mg total) by mouth 2 (two) times daily. 07/08/23  Yes Claiborne Rigg, NP  pregabalin (LYRICA) 100 MG capsule Take 1 capsule (100 mg total) by mouth 2 (two) times daily. 07/08/23  Yes Claiborne Rigg, NP  traZODone  (DESYREL) 100 MG tablet Take 1 tablet (100 mg total) by mouth at bedtime. 07/08/23  Yes Claiborne Rigg, NP  venlafaxine XR (EFFEXOR-XR) 150 MG 24 hr capsule Take 1 capsule (150 mg total) by mouth daily with breakfast. 07/08/23  Yes Claiborne Rigg, NP  ARIPiprazole (ABILIFY) 5 MG tablet Take 5 mg by mouth daily. 07/11/23   [provider]  thiamine (VITAMIN B1) 100 MG tablet Take 1 tablet (100 mg total) by mouth daily. Patient not taking: Reported on 2023-08-03 07/08/23   Claiborne Rigg, NP     Critical Care APP time 35 minutes  Glendale Chard, DO [facility] Pulmonary/Critical Care Medicine See Amion for personal pager PCCM on call pager [phone]  07/25/2023 1:12 PM

## 2023-07-25 NOTE — Progress Notes (Signed)
Patient ID: Erika [name], female   DOB: 1956-08-01, 67 y.o.   MRN: [id number] S: confused and nonverbal. O:BP (!) 101/42   Pulse (!) 52   Temp 98.5 F (36.9 C) (Axillary)   Resp 14   Ht 5\' 1"  (1.549 m)   Wt 69.8 kg   SpO2 98%   BMI 29.08 kg/m   Intake/Output Summary (Last 24 hours) at 07/25/2023 1234 Last data filed at 07/25/2023 1200 Gross per 24 hour  Intake 3284.24 ml  Output 2710 ml  Net 574.24 ml   Intake/Output: I/O last 3 completed shifts: In: 4140.8 [I.V.:780.8; Other:2300; IV Piggyback:1060] Out: 4750 [Urine:2025; Stool:2725]  Intake/Output this shift:  Total I/O In: 1382.8 [I.V.:82.8; Other:1000; IV Piggyback:300] Out: 160 [Urine:160] Weight change:  Gen: chronically ill-appearing, jaundiced CVS: bradycardic at 52 Resp:decreased bs at bases, poor inspiratory effort Abd: +BS, distended, + fluid wave Ext: no edema  Recent Labs  Lab 07/19/23 0658 07/20/23 0901 07/21/23 0828 07/22/23 0252 07/23/23 0308 07/23/23 4098 07/23/23 0821 07/23/23 2100 07/24/23 0113 07/24/23 0341 07/24/23 1001 07/24/23 1228 07/24/23 1751 07/24/23 2246  NA 134* 136 138 139 138 141  --   --   --   --  138  --   --   --   K 4.7 4.7 5.1 4.8 5.2* 4.9   < > 4.4 4.4 4.3 4.4 4.2 4.5 4.2  CL 98 99 102 99 99 95*  --   --   --   --  103  --   --   --   CO2 23 20* 20* 21* 20* 19*  --   --   --   --  19*  --   --   --   GLUCOSE 72 122* 151* 184* 175* 170*  --   --   --   --  175*  --   --   --   BUN 75* 83* 95* 104* 120* 125*  --   --   --   --  133*  --   --   --   CREATININE 3.20* 3.21* 3.28* 3.53* 4.12* 4.22*  --   --   --   --  4.07*  --   --   --   ALBUMIN 1.6* 2.2* 2.0* 3.8 4.0  --   --   --   --   --  3.1*  --   --   --   CALCIUM 6.4* 7.2* 7.6* 7.9* 8.0* 8.1*  --   --   --   --  7.5*  --   --   --   PHOS  --  8.1* 9.0*  --  9.6*  --   --   --   --   --  9.3*  --   --   --   AST 31  --  47* 49* 60*  --   --   --   --   --  85*  --   --   --   ALT 12  --  16 15 17   --   --   --    --   --  22  --   --   --    < > = values in this interval not displayed.   Liver Function Tests: Recent Labs  Lab 07/22/23 0252 07/23/23 0308 07/24/23 1001  AST 49* 60* 85*  ALT 15 17 22   ALKPHOS 63 46 49  BILITOT 7.9* 7.5* 7.3*  PROT 7.0 6.9 6.2*  ALBUMIN 3.8 4.0 3.1*   No results for input(s): "LIPASE", "AMYLASE" in the last 168 hours. Recent Labs  Lab 07/20/23 1448 07/21/23 0828  AMMONIA 84* 84*   CBC: Recent Labs  Lab 07/19/23 0658 07/20/23 0901 07/21/23 0828 07/21/23 1914 07/22/23 0252 07/23/23 0308 07/23/23 1220 07/24/23 0341 07/25/23 0432  WBC 10.4   < > 13.1* 13.2* 12.2* 9.5  --  4.8 5.7  NEUTROABS 9.0*  --  11.2*  --  10.0*  --   --   --   --   HGB 8.4*   < > 8.4* 7.6* 7.3* 6.3* 7.4* 7.0* 7.2*  HCT 24.7*   < > 25.7* 23.0* 22.7* 19.6* 21.9* 21.6* 22.2*  MCV 90.1   < > 93.1 93.9 96.2 98.5  --  95.2 95.7  PLT 34*   < > 40* 55* 51* 51*  --  35* 30*   < > = values in this interval not displayed.   Cardiac Enzymes: No results for input(s): "CKTOTAL", "CKMB", "CKMBINDEX", "TROPONINI" in the last 168 hours. CBG: Recent Labs  Lab 07/22/23 0733 07/22/23 1118 07/23/23 0455 07/24/23 1953 07/25/23 0340  GLUCAP 130* 162* 145* 129* 129*    Iron Studies: No results for input(s): "IRON", "TIBC", "TRANSFERRIN", "FERRITIN" in the last 72 hours. Studies/Results: CT HEAD WO CONTRAST ( )  Result Date: 07/23/2023 CLINICAL DATA:  Anisocoria, encephalopathy EXAM: CT HEAD WITHOUT CONTRAST TECHNIQUE: Contiguous axial images were obtained from the base of the skull through the vertex without intravenous contrast. RADIATION DOSE REDUCTION: This exam was performed according to the departmental dose-optimization program which includes automated exposure control, adjustment of the mA and/or kV according to patient size and/or use of iterative reconstruction technique. COMPARISON:  2023/07/24 FINDINGS: Brain: No evidence of acute infarction, hemorrhage, mass, mass effect, or  midline shift. No hydrocephalus or extra-axial fluid collection. Periventricular white matter changes, likely the sequela of chronic small vessel ischemic disease. Vascular: No hyperdense vessel. Atherosclerotic calcifications in the intracranial carotid and vertebral arteries. Skull: Negative for fracture or focal lesion. Sinuses/Orbits: No acute finding. Other: Fluid in the bilateral mastoid air cells. IMPRESSION: No acute intracranial process. Electronically Signed   By: Wiliam Ke M.D.   On: 07/23/2023 21:56    Chlorhexidine Gluconate Cloth  6 each Topical Daily   dextrose  50 mL Intravenous Once   feeding supplement  237 mL Oral BID BM   FLUoxetine  10 mg Oral Daily   insulin aspart  5 Units Subcutaneous Once   lactulose  300 mL Rectal BID   midodrine  10 mg Oral Once   multivitamin with minerals  1 tablet Oral Daily   nicotine  7 mg Transdermal Daily   octreotide  200 mcg Subcutaneous Q12H   mouth rinse  15 mL Mouth Rinse 4 times per day   pantoprazole (PROTONIX) IV  40 mg Intravenous Q12H   rifaximin  550 mg Oral BID   sodium bicarbonate  50 mEq Intravenous Once   thiamine (VITAMIN B1) injection  100 mg Intravenous Q24H    BMET    Component Value Date/Time   NA 138 07/24/2023 1001   NA 141 07/08/2023 1629   K 4.2 07/24/2023 2246   CL 103 07/24/2023 1001   CO2 19 (L) 07/24/2023 1001   GLUCOSE 175 (H) 07/24/2023 1001   BUN 133 (H) 07/24/2023 1001   BUN 18 07/08/2023 1629   CREATININE 4.07 (H) 07/24/2023 1001   CALCIUM 7.5 (L) 07/24/2023 1001  GFRNONAA 11 (L) 07/24/2023 1001   CBC    Component Value Date/Time   WBC 5.7 07/25/2023 0432   RBC 2.32 (L) 07/25/2023 0432   HGB 7.2 (L) 07/25/2023 0432   HGB 10.7 (L) 07/08/2023 1629   HCT 22.2 (L) 07/25/2023 0432   HCT 31.6 (L) 07/08/2023 1629   PLT 30 (L) 07/25/2023 0432   PLT 71 (LL) 07/08/2023 1629   MCV 95.7 07/25/2023 0432   MCV 95 07/08/2023 1629   MCH 31.0 07/25/2023 0432   MCHC 32.4 07/25/2023 0432   RDW 17.0  (H) 07/25/2023 0432   RDW 13.4 07/08/2023 1629   LYMPHSABS 1.5 07/22/2023 0252   LYMPHSABS 0.6 (L) 07/08/2023 1629   MONOABS 0.5 07/22/2023 0252   EOSABS 0.0 07/22/2023 0252   EOSABS 0.0 07/08/2023 1629   BASOSABS 0.0 07/22/2023 0252   BASOSABS 0.0 07/08/2023 1629     Assessment/Plan:  AKI- multifactorial with HRS, cocaine, and NSAIDs with poor po intake as well as acute anemia.  Renal US with increased echogenicity on the right, normal left kidney.  She was started on octreiotide and levophed.  Midodrine stopped due to bradycardia.  UOP did pick up yesterday after IV lasix.  Scr slightly improved to 4.07 from peak 4.22.  She is not a candidate for liver transplant given ongoing Etoh and cocaine.  Would not recommend CRRT or HD and recommend palliative care consult.   Avoid nephrotoxic medications including NSAIDs and iodinated intravenous contrast exposure unless the latter is absolutely indicated.   Preferred narcotic agents for pain control are hydromorphone, fentanyl, and methadone. Morphine should not be used.  Avoid Baclofen and avoid oral sodium phosphate and magnesium citrate based laxatives / bowel preps.  Continue strict Input and Output monitoring. 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  On octreotide and levophed.  No midodrine due to bradycardia.  SVT - cardiology following.  Septic shock - possible pneumonia. On abx and levophed per PCCM Hyperkalemia - resolved.  Thrombocytopenia - due to #2 Anemia - normocytic.  Transfuse for Hgb <7 Acute on chronic CHF - with pulmonary edema.  Lasix given yesterday and may need to redose. PUD - on PPI Disposition - poor overall prognosis. Recommend palliative care consult to help set goals/limits of care.    Irena Cords, MD [facility]

## 2023-07-25 NOTE — Plan of Care (Signed)
  Problem: Clinical Measurements: Goal: Respiratory complications will improve Outcome: Progressing   Problem: Nutrition: Goal: Adequate nutrition will be maintained Outcome: Progressing   Problem: Education: Goal: Knowledge of General Education information will improve Description: Including pain rating scale, medication(s)/side effects and non-pharmacologic comfort measures Outcome: Not Progressing   Problem: Health Behavior/Discharge Planning: Goal: Ability to manage health-related needs will improve Outcome: Not Progressing   Problem: Activity: Goal: Risk for activity intolerance will decrease Outcome: Not Progressing

## 2023-07-25 NOTE — Procedures (Signed)
Cortrak  Person Inserting Tube:  Greig Castilla D, RD Tube Type:  Cortrak - 43 inches Tube Size:  10 Tube Location:  Left nare Secured by: Bridle Technique Used to Measure Tube Placement:  Marking at nare/corner of mouth Cortrak Secured At:  69 cm Procedure Comments:  Cortrak Tube Team Note:  Consult received to place a Cortrak feeding tube.   X-ray is required, abdominal x-ray has been ordered by the Cortrak team. Please confirm tube placement before using the Cortrak tube.   If the tube becomes dislodged please keep the tube and contact the Cortrak team at [email or web address] for replacement.  If after hours and replacement cannot be delayed, place a NG tube and confirm placement with an abdominal x-ray.    Greig Castilla, RD, LDN Clinical Dietitian RD pager # available in AMION  After hours/weekend pager # available in [facility]

## 2023-07-25 NOTE — Progress Notes (Signed)
Telemetry reviewed.  No Afib seen, in sinus rhythm with PACs.  Will continue to monitor.  Little Ishikawa, MD

## 2023-07-25 NOTE — Progress Notes (Addendum)
Nutrition Follow-up  DOCUMENTATION CODES:   Not applicable  INTERVENTION:   Initiate tube feeding via Cortrak after tip location confirmed by x-ray: Osmolite 1.5 at 40 ml/h (960 ml per day) Prosource TF20 60 ml BID  Provides 1600 kcal, 100 gm protein, 732 ml free water daily.  MVI with minerals via tube once daily.  NUTRITION DIAGNOSIS:   Inadequate oral intake related to decreased appetite as evidenced by meal completion < 50%.  Ongoing  GOAL:   Patient will meet greater than or equal to 90% of their needs  Progressing   MONITOR:   PO intake, Supplement acceptance, Labs  REASON FOR ASSESSMENT:   Consult Assessment of nutrition requirement/status  ASSESSMENT:   Pt with hx of GED, HTN, EtOH abuse, and cirrhosis presented to ED with left hip swelling after a fall last week. Pt also reported dark stools. Imaging showed a subcutaneous hematoma to her left hip and low Hgb.  Patient is currently in the ICU on HHFNC 20 L.  Noted poor prognosis. Palliative Care team is following.  NPO since 9/21. Prior to that intake was poor, 25-50% meal intakes. Cortrak was placed today. Awaiting x-ray confirmation of tip location. Okay to begin TF per discussion with CCM.   Labs reviewed. BUN 133, creat 4.07, phos 9.3  CBG: 129  Medications reviewed and include lactulose, MVI with minerals, octreotide, protonix, thiamine, precedex, levophed.   Intake/Output Summary (Last 24 hours) at 07/25/2023 1616 Last data filed at 07/25/2023 1600 Gross per 24 hour  Intake 3440.55 ml  Output 2515 ml  Net 925.55 ml   Rectal tube in place with 1,000 ml output documented so far today.   Weight history reviewed. Patient was 61.4 kg on 07/08/23, up to 65.8 kg on 9/14, up to 69.8 kg 9/20. Increase in weight related to fluid retention associated with liver disease.   NUTRITION - FOCUSED PHYSICAL EXAM:  Unable to complete  Diet Order:   Diet Order             Diet NPO time specified  Diet  effective now                   EDUCATION NEEDS:   Not appropriate for education at this time  Skin:  Skin Assessment: Skin Integrity Issues: Skin Integrity Issues:: Other (Comment) Other: non pressure wound L finger, laceration R elbow  Last BM:  9/23 rectal tube  Height:   Ht Readings from Last 1 Encounters:  08/01/2023 5\' 1"  (1.549 m)    Weight:   Wt Readings from Last 1 Encounters:  07/22/23 69.8 kg    Ideal Body Weight:  47.7 kg  BMI:  Body mass index is 29.08 kg/m.  Estimated Nutritional Needs:   Kcal:  1500-1700  Protein:  85-95 gm  Fluid:  1.5L/d   Gabriel Rainwater RD, LDN, CNSC Please refer to Amion for contact information.

## 2023-07-26 ENCOUNTER — Inpatient Hospital Stay (HOSPITAL_COMMUNITY): Payer: 59

## 2023-07-26 ENCOUNTER — Encounter: Payer: Self-pay | Admitting: Gastroenterology

## 2023-07-26 DIAGNOSIS — N179 Acute kidney failure, unspecified: Secondary | ICD-10-CM | POA: Diagnosis not present

## 2023-07-26 DIAGNOSIS — D500 Iron deficiency anemia secondary to blood loss (chronic): Secondary | ICD-10-CM | POA: Diagnosis not present

## 2023-07-26 DIAGNOSIS — J9601 Acute respiratory failure with hypoxia: Secondary | ICD-10-CM

## 2023-07-26 DIAGNOSIS — Z515 Encounter for palliative care: Secondary | ICD-10-CM | POA: Diagnosis not present

## 2023-07-26 DIAGNOSIS — Z7189 Other specified counseling: Secondary | ICD-10-CM

## 2023-07-26 LAB — CBC
HCT: 22.7 % — ABNORMAL LOW (ref 36.0–46.0)
Hemoglobin: 7.1 g/dL — ABNORMAL LOW (ref 12.0–15.0)
MCH: 30.6 pg (ref 26.0–34.0)
MCHC: 31.3 g/dL (ref 30.0–36.0)
MCV: 97.8 fL (ref 80.0–100.0)
Platelets: 40 10*3/uL — ABNORMAL LOW (ref 150–400)
RBC: 2.32 MIL/uL — ABNORMAL LOW (ref 3.87–5.11)
RDW: 18.8 % — ABNORMAL HIGH (ref 11.5–15.5)
WBC: 6.9 10*3/uL (ref 4.0–10.5)
nRBC: 0 % (ref 0.0–0.2)

## 2023-07-26 LAB — PROCALCITONIN: Procalcitonin: 3.27 ng/mL

## 2023-07-26 LAB — HEPATIC FUNCTION PANEL
ALT: 19 U/L (ref 0–44)
AST: 75 U/L — ABNORMAL HIGH (ref 15–41)
Albumin: 2.6 g/dL — ABNORMAL LOW (ref 3.5–5.0)
Alkaline Phosphatase: 67 U/L (ref 38–126)
Bilirubin, Direct: 2.3 mg/dL — ABNORMAL HIGH (ref 0.0–0.2)
Indirect Bilirubin: 2.5 mg/dL — ABNORMAL HIGH (ref 0.3–0.9)
Total Bilirubin: 4.8 mg/dL — ABNORMAL HIGH (ref 0.3–1.2)
Total Protein: 6.4 g/dL — ABNORMAL LOW (ref 6.5–8.1)

## 2023-07-26 LAB — RENAL FUNCTION PANEL
Albumin: 2.6 g/dL — ABNORMAL LOW (ref 3.5–5.0)
Anion gap: 16 — ABNORMAL HIGH (ref 5–15)
BUN: 143 mg/dL — ABNORMAL HIGH (ref 8–23)
CO2: 20 mmol/L — ABNORMAL LOW (ref 22–32)
Calcium: 8 mg/dL — ABNORMAL LOW (ref 8.9–10.3)
Chloride: 107 mmol/L (ref 98–111)
Creatinine, Ser: 3.52 mg/dL — ABNORMAL HIGH (ref 0.44–1.00)
GFR, Estimated: 14 mL/min — ABNORMAL LOW (ref >60)
Glucose, Bld: 234 mg/dL — ABNORMAL HIGH (ref 70–99)
Phosphorus: 7.5 mg/dL — ABNORMAL HIGH (ref 2.5–4.6)
Potassium: 4.1 mmol/L (ref 3.5–5.1)
Sodium: 143 mmol/L (ref 135–145)

## 2023-07-26 LAB — CULTURE, BLOOD (ROUTINE X 2)
Culture: NO GROWTH
Culture: NO GROWTH
Special Requests: ADEQUATE
Special Requests: ADEQUATE

## 2023-07-26 LAB — GLUCOSE, CAPILLARY
Glucose-Capillary: 198 mg/dL — ABNORMAL HIGH (ref 70–99)
Glucose-Capillary: 221 mg/dL — ABNORMAL HIGH (ref 70–99)
Glucose-Capillary: 214 mg/dL — ABNORMAL HIGH (ref 70–99)
Glucose-Capillary: 242 mg/dL — ABNORMAL HIGH (ref 70–99)
Glucose-Capillary: 258 mg/dL — ABNORMAL HIGH (ref 70–99)
Glucose-Capillary: 252 mg/dL — ABNORMAL HIGH (ref 70–99)

## 2023-07-26 LAB — PHOSPHORUS: Phosphorus: 7.4 mg/dL — ABNORMAL HIGH (ref 2.5–4.6)

## 2023-07-26 LAB — PROTIME-INR
INR: 2.1 — ABNORMAL HIGH (ref 0.8–1.2)
Prothrombin Time: 23.4 seconds — ABNORMAL HIGH (ref 11.4–15.2)

## 2023-07-26 LAB — MAGNESIUM: Magnesium: 2.2 mg/dL (ref 1.7–2.4)

## 2023-07-26 LAB — LACTIC ACID, PLASMA
Lactic Acid, Venous: 3.7 mmol/L (ref 0.5–1.9)
Lactic Acid, Venous: 3.6 mmol/L (ref 0.5–1.9)

## 2023-07-26 MED ORDER — MIDODRINE HCL 5 MG PO TABS
10.0000 mg | ORAL_TABLET | Freq: Three times a day (TID) | ORAL | Status: DC
  Administered 2023-07-26 – 2023-07-27 (×3): 10 mg
  Filled 2023-07-26 (×3): qty 2

## 2023-07-26 MED ORDER — LAMOTRIGINE 100 MG PO TABS: 50.0000 mg | ORAL_TABLET | Freq: Every day | ORAL | Status: DC

## 2023-07-26 MED ORDER — LAMOTRIGINE 25 MG PO TABS
25.0000 mg | ORAL_TABLET | Freq: Every day | ORAL | Status: DC
  Administered 2023-07-26 – 2023-07-29 (×4): 25 mg
  Filled 2023-07-26 (×4): qty 1

## 2023-07-26 MED ORDER — ALBUMIN HUMAN 25 % IV SOLN
12.5000 g | Freq: Once | INTRAVENOUS | Status: AC
  Administered 2023-07-26: 12.5 g via INTRAVENOUS
  Filled 2023-07-26: qty 50

## 2023-07-26 MED ORDER — LAMOTRIGINE 100 MG PO TABS: 50.0000 mg | ORAL_TABLET | Freq: Two times a day (BID) | ORAL | Status: DC

## 2023-07-26 MED ORDER — LACTATED RINGERS IV BOLUS
500.0000 mL | Freq: Once | INTRAVENOUS | Status: AC
  Administered 2023-07-26: 500 mL via INTRAVENOUS

## 2023-07-26 MED ORDER — THIAMINE MONONITRATE 100 MG PO TABS
100.0000 mg | ORAL_TABLET | Freq: Every day | ORAL | Status: DC
  Administered 2023-07-26 – 2023-07-29 (×4): 100 mg
  Filled 2023-07-26 (×4): qty 1

## 2023-07-26 MED ORDER — INSULIN ASPART 100 UNIT/ML IJ SOLN
1.0000 [IU] | INTRAMUSCULAR | Status: DC
  Administered 2023-07-26 – 2023-07-27 (×4): 3 [IU] via SUBCUTANEOUS

## 2023-07-26 MED ORDER — LAMOTRIGINE 100 MG PO TABS: 100.0000 mg | ORAL_TABLET | Freq: Two times a day (BID) | ORAL | Status: DC

## 2023-07-26 NOTE — Progress Notes (Addendum)
NAME:  Erika [name], MRN:  [id number], DOB:  12/22/1955, LOS: 10 ADMISSION DATE:  07/30/2023, CONSULTATION DATE:  07/21/23 REFERRING MD:  Grier Mitts., MD CHIEF COMPLAINT:  Hypotension/AMS   History of Present Illness:  Erika [name] is a 67 y.o. female with a hx of rheumatoid arthritis, cirrhosis secondary to alcohol abuse, bipolar disorder, history of portal hypertension, GERD, HTN, chronic pain on lyrica who was admitted 9/14 for swelling of her left hip after suffering a fall the week before. She was admitted for acute blood loss anemia due to left hip hematoma and heme positive stool.   She developed runs of SVT treated with beta blockers. She developed hypotension and altered mental status with bradycardia which prompted PCCM consult.   Nephrology is following for AKI in setting of hepato renal syndrome. GI has been following for cirrhosis and esophageal varices and portal hypertension. She has been on lactulose and rifaximin for hepatic encephalopathy.  Pertinent  Medical History   Past Medical History:  Diagnosis Date   Arthritis    Cirrhosis (HCC)    Depression    GERD (gastroesophageal reflux disease)    History of shingles 10/18/2018   Hypertension    Rheumatoid arthritis(714.0)    Significant Hospital Events: Including procedures, antibiotic start and stop dates in addition to other pertinent events   9/14 admitted 9/19 Altered mental status, hypotension and bradycardia, transferred to ICU. Unable to obtain fluid for paracentesis 9/21 goals of care discussions >> family want to continue aggressive care 9/23 Cortrak placed, tube feeds started, Lactulose increased 30 mg q4h   Interim History / Subjective:  Levophed off for pressure support overnight  Encephalopathic - opens eyes to voice   Objective   Blood pressure (!) 117/47, pulse (!) 56, temperature 99.4 F (37.4 C), temperature source Axillary, resp. rate 13, height 5\' 1"  (1.549 m), weight 72.5 kg,  SpO2 99%.    FiO2 (%):  [50 %-55 %] 55 %   Intake/Output Summary (Last 24 hours) at 07/26/2023 2440 Last data filed at 07/26/2023 0400 Gross per 24 hour  Intake 1598.96 ml  Output 915 ml  Net 683.96 ml   Filed Weights   07/17/2023 1328 07/22/23 0500 07/26/23 0454  Weight: 65.8 kg 69.8 kg 72.5 kg    Examination: General: Chronically ill-appearing, jaundiced, encephalopathic HENT: Anicteric, dry oral mucosa, No LAD,+ JVD, PERRLA Lungs: Bilateral chest excursion , Few Rhonchi Cardiovascular: S1-S2 , No RMG, SB per tele Abdomen: Soft, bowel sounds appreciated Extremities: Ecchymosis left leg, brisk refill Neuro: encephalopathic, MAE x 4 but not to command  GU: Foley   Labs pending   Resolved Hospital Problem list     Assessment & Plan:   Septic shock Metabolic encephalopathy Possible pneumonia Hypotension  -No growth on blood cultures -few staph aureus + right arm wound culture>> current ABX therapy covers -Continue cefazolin -No significant fluid for paracentesis -Levophed to maintain BP MAP > 60 mm HG and SBP >100, consider switching to midodrine per tube  Alcoholic cirrhosis Portal hypertension Esophageal varices Metabolic encephalopathy/hepatic encephalopathy/hepatorenal syndrome -S/p EGD 916 with no overt bleeding -RUQ [place]: ascites, liver cirrhosis, GB sludge w/ thickening  -Cortrak placed, lactulose 30 g q4h  -Unable to take p.o. because of altered mental status  Acute kidney injury Hepatorenal syndrome -Continue Octreotide -Trend BMET -Appreciate nephrology input -Maintain MAP >60, SBP >100  Anemia Thrombocytopenia - trend CBC,  - transfuse for HGB < 7  Acute on chronic diastolic heart failure Pulmonary edema  Paroxysmal atrial fibrillation SVT -Beta-blockers on hold  Peptic ulcer disease - PPI BID x 8 weeks per GI  Goals of care discussion with family 9/21>> Plan to continue aggressive care , Code status remains full Code  Best Practice (right  click and "Reselect all SmartList Selections" daily)   Diet/type: tubefeeds and NPO DVT prophylaxis: not indicated GI prophylaxis: PPI Lines: N/A Foley:  Yes, and it is still needed Code Status:  full code Last date of multidisciplinary goals of care discussion [9/21 with son]  Labs   CBC: Recent Labs  Lab 07/21/23 0828 07/21/23 1914 07/22/23 0252 07/23/23 0308 07/23/23 1220 07/24/23 0341 07/25/23 0432  WBC 13.1* 13.2* 12.2* 9.5  --  4.8 5.7  NEUTROABS 11.2*  --  10.0*  --   --   --   --   HGB 8.4* 7.6* 7.3* 6.3* 7.4* 7.0* 7.2*  HCT 25.7* 23.0* 22.7* 19.6* 21.9* 21.6* 22.2*  MCV 93.1 93.9 96.2 98.5  --  95.2 95.7  PLT 40* 55* 51* 51*  --  35* 30*    Basic Metabolic Panel: Recent Labs  Lab 07/20/23 0901 07/21/23 0828 07/22/23 0252 07/23/23 0308 07/23/23 0611 07/23/23 0821 07/24/23 0341 07/24/23 1001 07/24/23 1228 07/24/23 1751 07/24/23 2246  NA 136 138 139 138 141  --   --  138  --   --   --   K 4.7 5.1 4.8 5.2* 4.9   < > 4.3 4.4 4.2 4.5 4.2  CL 99 102 99 99 95*  --   --  103  --   --   --   CO2 20* 20* 21* 20* 19*  --   --  19*  --   --   --   GLUCOSE 122* 151* 184* 175* 170*  --   --  175*  --   --   --   BUN 83* 95* 104* 120* 125*  --   --  133*  --   --   --   CREATININE 3.21* 3.28* 3.53* 4.12* 4.22*  --   --  4.07*  --   --   --   CALCIUM 7.2* 7.6* 7.9* 8.0* 8.1*  --   --  7.5*  --   --   --   MG  --  2.2  --   --   --   --   --   --   --   --   --   PHOS 8.1* 9.0*  --  9.6*  --   --   --  9.3*  --   --   --    < > = values in this interval not displayed.   GFR: Estimated Creatinine Clearance: 12.2 mL/min (A) (by C-G formula based on SCr of 4.07 mg/dL (H)). Recent Labs  Lab 07/21/23 1913 07/21/23 1914 07/21/23 2045 07/22/23 0252 07/22/23 5621 07/22/23 0938 07/23/23 0308 07/24/23 0341 07/25/23 0432  WBC  --    < >  --  12.2*  --   --  9.5 4.8 5.7  LATICACIDVEN 3.2*  --  3.4*  --  2.5* 2.5*  --   --   --    < > = values in this interval not  displayed.    Liver Function Tests: Recent Labs  Lab 07/20/23 0901 07/21/23 0828 07/22/23 0252 07/23/23 0308 07/24/23 1001  AST  --  47* 49* 60* 85*  ALT  --  16 15 17 22   ALKPHOS  --  77 63 46 49  BILITOT  --  7.4* 7.9* 7.5* 7.3*  PROT  --  6.1* 7.0 6.9 6.2*  ALBUMIN 2.2* 2.0* 3.8 4.0 3.1*   No results for input(s): "LIPASE", "AMYLASE" in the last 168 hours. Recent Labs  Lab 07/20/23 1448 07/21/23 0828  AMMONIA 84* 84*    ABG    Component Value Date/Time   PHART 7.35 07/21/2023 1726   PCO2ART 44 07/21/2023 1726   PO2ART 90 07/21/2023 1726   HCO3 24.3 07/21/2023 1726   ACIDBASEDEF 1.5 07/21/2023 1726   O2SAT 97.5 07/21/2023 1726     Coagulation Profile: Recent Labs  Lab 07/23/23 0308  INR 2.2*    Cardiac Enzymes: No results for input(s): "CKTOTAL", "CKMB", "CKMBINDEX", "TROPONINI" in the last 168 hours.   HbA1C: Hgb A1c MFr Bld  Date/Time Value Ref Range Status  04/24/2021 12:46 AM 5.6 4.8 - 5.6 % Final    Comment:    (NOTE) Pre diabetes:          5.7%-6.4%  Diabetes:              >6.4%  Glycemic control for   <7.0% adults with diabetes     CBG: Recent Labs  Lab 07/23/23 0455 07/24/23 1953 07/25/23 0340 07/25/23 2335 07/26/23 0340  GLUCAP 145* 129* 129* 200* 198*    Allergies No Known Allergies   Home Medications  Prior to Admission medications   Medication Sig Start Date End Date Taking? Authorizing Provider  FLUoxetine (PROZAC) 20 MG capsule Take 1 capsule (20 mg total) by mouth daily. 07/08/23  Yes Claiborne Rigg, NP  lamoTRIgine (LAMICTAL) 100 MG tablet Take 1 tablet (100 mg total) by mouth 2 (two) times daily. 07/08/23  Yes Claiborne Rigg, NP  pregabalin (LYRICA) 100 MG capsule Take 1 capsule (100 mg total) by mouth 2 (two) times daily. 07/08/23  Yes Claiborne Rigg, NP  traZODone (DESYREL) 100 MG tablet Take 1 tablet (100 mg total) by mouth at bedtime. 07/08/23  Yes Claiborne Rigg, NP  venlafaxine XR (EFFEXOR-XR) 150 MG 24 hr  capsule Take 1 capsule (150 mg total) by mouth daily with breakfast. 07/08/23  Yes Claiborne Rigg, NP  ARIPiprazole (ABILIFY) 5 MG tablet Take 5 mg by mouth daily. 07/11/23   [provider]  thiamine (VITAMIN B1) 100 MG tablet Take 1 tablet (100 mg total) by mouth daily. Patient not taking: Reported on 2023/08/02 07/08/23   Claiborne Rigg, NP     Critical Care APP time 35 minutes  Glendale Chard, DO [facility] Pulmonary/Critical Care Medicine See Amion for personal pager PCCM on call pager [phone]  07/26/2023 7:12 AM

## 2023-07-26 NOTE — Progress Notes (Signed)
NAME:  Erika [name], MRN:  [id number], DOB:  Jul 13, 1956, LOS: 11 ADMISSION DATE:  07/06/2023, CONSULTATION DATE:  07/21/23 REFERRING MD:  Grier Mitts., MD CHIEF COMPLAINT:  Hypotension/AMS   History of Present Illness:  Erika [name] is a 67 y.o. female with a hx of rheumatoid arthritis, cirrhosis secondary to alcohol abuse, bipolar disorder, history of portal hypertension, GERD, HTN, chronic pain on lyrica who was admitted 9/14 for swelling of her left hip after suffering a fall the week before. She was admitted for acute blood loss anemia due to left hip hematoma and heme positive stool.   She developed runs of SVT treated with beta blockers. She developed hypotension and altered mental status with bradycardia which prompted PCCM consult.   Nephrology is following for AKI in setting of hepato renal syndrome. GI has been following for cirrhosis and esophageal varices and portal hypertension. She has been on lactulose and rifaximin for hepatic encephalopathy.  Pertinent  Medical History   Past Medical History:  Diagnosis Date   Arthritis    Cirrhosis (HCC)    Depression    GERD (gastroesophageal reflux disease)    History of shingles 10/18/2018   Hypertension    Rheumatoid arthritis(714.0)    Significant Hospital Events: Including procedures, antibiotic start and stop dates in addition to other pertinent events   9/14 admitted 9/19 Altered mental status, hypotension and bradycardia, transferred to ICU. Unable to obtain fluid for paracentesis 9/21 goals of care discussions >> family want to continue aggressive care 9/23 Cortrak placed, tube feeds started, Lactulose increased 30 mg q4h  9/24 [facility] called for transfer for liver transplant > declined. Off Levophed.   Interim History / Subjective:  Continue to be encephalopathic - opens eyes to voice not redirectable. BP stable overnight on midodrine 10 mg q8h.   Objective   Blood pressure (!) 138/52, pulse (!) 54,  temperature 100 F (37.8 C), temperature source Oral, resp. rate 11, height 5\' 1"  (1.549 m), weight 72.5 kg, SpO2 97%.    FiO2 (%):  [50 %] 50 %   Intake/Output Summary (Last 24 hours) at 07/27/2023 0738 Last data filed at 07/27/2023 0600 Gross per 24 hour  Intake 2233.14 ml  Output 1850 ml  Net 383.14 ml   Filed Weights   07/28/2023 1328 07/22/23 0500 07/26/23 0454  Weight: 65.8 kg 69.8 kg 72.5 kg    Examination:  General: Chronically ill-appearing, jaundiced, encephalopathic, agitated  HENT: Anicteric, dry oral mucosa, No LAD,+ JVD, PERRLA Lungs: Bilateral chest excursion , Few Rhonchi Cardiovascular: S1-S2 , No RMG, SB per tele Abdomen: Soft, bowel sounds appreciated Extremities: Ecchymosis left leg, brisk refill Neuro: encephalopathic, MAE x 4 but not to command  GU: Foley   Labs:  LA: 3.6>3.8 Cr 3.52>3.00 Hgb 7.1>8.2  Resolved Hospital Problem list     Assessment & Plan:   ASSESSMENT / PLAN:  PULMONARY  A:  Pulmonary edema: stable on 20L/50% HFNC. CXR obtained 9/24 showed improvement in pulmonary edema. Secondary to portal hypertension and hepatorenal syndrome.  07/27/2023 -> stable on 20L/50% HFNC overnight.   P:   - continue HFNC, wean as tolerated  - chest PT as tolerated  - monitor fluid status    NEUROLOGIC A:   Hepatic Encephalopathy: Slight improvement. Able to track to voice. Becomes agitated when off Precedex. CT head on 9/21 negative for acute intracranial process.  P:   - continue lactulose and rifaximin - reduce sedation as tolerated   - soft restraints applied  this morning for patient safety  VASCULAR A:  Negative for DVT: stable, bilateral LE [place] completed 9/14 which was negative for DVT. No unilateral swelling on exam today.   P:  - monitor clinically   CARDIAC STRUCTURAL A: Grade 2 DD: Stable, TTE completed on 9/15 with LVEF 60-65%, no wall motion abnormalities.  Tricuspid valve regurgitation   P: - continue cardiac monitoring    CARDIAC ELECTRICAL A: NSR: stable on tele, electrolytes currently stable.    INFECTIOUS A:   Septic Shock: Presented to the ICU with hypotension which has since improved. No growth on Bcx 5 days. On Right arm wound cultures - positive for staph aureus which is covered by current therapy. LA elevated yesterday to 3.5. On recheck it resulted 3.8.  Lactic Acidosis: Worsening, concerning for LA up-trending without clear source of infection at this time. Procal elevated to 3, up from prior.  Pneumonia R/O: breathing has been stable on HFNC, afebrile.    ABX:  CTX (9/14- 9/19) Cefepime (9/19 - 9/22) Ancef (9/23 - )  P:   - continue Cefazolin  - s/p 500 cc bolus 9/24 for elevated LA  - follow blood cultures  - consider workup for SBP  RENAL A:  Acute kidney injury 2/2 Hepatorenal syndrome: Worsening. Unfortunately not a candidate for dialysis due to overall clinical prognosis. Cr down trending. Urine output 850 cc yesterday, overall NET + 9.8 L this admission.    P:  - nephrology following, appreciate recommendations  - Continue Octreotide - Trend BMET - Maintain MAP >60, SBP >100  ELECTROLYTES A:  Hyperphosphatemia: Improving. Down from 9.0 on 9/19 P: - daily CMP, Mag, Phos    GASTROINTESTINAL A:   Alcoholic cirrhosis: S/P RUQ [place]: ascites, liver cirrhosis, GB sludge w/ thickening  Portal hypertension Esophageal varices: stable, S/P EGD 916 with no overt bleeding  Peptic Ulcer Disease: hgb stable, continues on PPI.   P:   - Cortrak placed, lactulose 30 g q4h  - Rifaximin q12h  - PPI BID x 8 weeks per GI   HEMATOLOGIC   - HEME A:  Normocytic Anemia: stable, at 8.1. 2/2 to chronic disease and acute GI bleed in earlier admission. Patient is high risk for esophageal bleed with known esophageal varices.   P:  - PRBC for hgb </= 6.9gm%    - exceptions are   -  if ACS susepcted/confirmed then transfuse for hgb </= 8.0gm%,  or    -  active bleeding with hemodynamic  instability, then transfuse regardless of hemoglobin value   At at all times try to transfuse 1 unit prbc as possible with exception of active hemorrhage   HEMATOLOGIC - Platelets A Thrombocytopenia: Stable, 2/2 to liver failure   P: - trend CBC  - monitor for signs of bleeding   ENDOCRINE A:  No history of diabetes.   MSK/DERM Skin intact. Pressure bandages applied to bilateral ankles.    Best Practice (right click and "Reselect all SmartList Selections" daily)   Diet/type: tubefeeds and NPO DVT prophylaxis: not indicated GI prophylaxis: PPI Lines: N/A Foley:  Yes, and it is still needed Code Status:  full code Last date of multidisciplinary goals of care discussion [9/21 with son]  Labs   CBC: Recent Labs  Lab 07/21/23 0828 07/21/23 1914 07/22/23 0252 07/23/23 0308 07/23/23 1220 07/24/23 0341 07/25/23 0432 07/26/23 0706 07/27/23 0340  WBC 13.1*   < > 12.2* 9.5  --  4.8 5.7 6.9 8.4  NEUTROABS 11.2*  --  10.0*  --   --   --   --   --   --   HGB 8.4*   < > 7.3* 6.3* 7.4* 7.0* 7.2* 7.1* 8.2*  HCT 25.7*   < > 22.7* 19.6* 21.9* 21.6* 22.2* 22.7* 25.8*  MCV 93.1   < > 96.2 98.5  --  95.2 95.7 97.8 100.8*  PLT 40*   < > 51* 51*  --  35* 30* 40* 41*   < > = values in this interval not displayed.    Basic Metabolic Panel: Recent Labs  Lab 07/21/23 0828 07/22/23 0252 07/23/23 0308 07/23/23 4132 07/23/23 0821 07/24/23 1001 07/24/23 1228 07/24/23 1751 07/24/23 2246 07/26/23 0706 07/26/23 0707 07/27/23 0340  NA 138   < > 138 141  --  138  --   --   --   --  143 150*  K 5.1   < > 5.2* 4.9   < > 4.4 4.2 4.5 4.2  --  4.1 4.2  CL 102   < > 99 95*  --  103  --   --   --   --  107 112*  CO2 20*   < > 20* 19*  --  19*  --   --   --   --  20* 23  GLUCOSE 151*   < > 175* 170*  --  175*  --   --   --   --  234* 235*  BUN 95*   < > 120* 125*  --  133*  --   --   --   --  143* 145*  CREATININE 3.28*   < > 4.12* 4.22*  --  4.07*  --   --   --   --  3.52* 3.00*  CALCIUM  7.6*   < > 8.0* 8.1*  --  7.5*  --   --   --   --  8.0* 8.4*  MG 2.2  --   --   --   --   --   --   --   --  2.2  --   --   PHOS 9.0*  --  9.6*  --   --  9.3*  --   --   --  7.4* 7.5* 6.1*   < > = values in this interval not displayed.   GFR: Estimated Creatinine Clearance: 16.6 mL/min (A) (by C-G formula based on SCr of 3 mg/dL (H)). Recent Labs  Lab 07/22/23 0938 07/23/23 0308 07/24/23 0341 07/25/23 0432 07/26/23 0706 07/26/23 1838 07/27/23 0340  PROCALCITON  --   --   --   --  3.27  --   --   WBC  --    < > 4.8 5.7 6.9  --  8.4  LATICACIDVEN 2.5*  --   --   --  3.7* 3.6* 3.8*   < > = values in this interval not displayed.    Liver Function Tests: Recent Labs  Lab 07/21/23 0828 07/22/23 0252 07/23/23 0308 07/24/23 1001 07/26/23 0706 07/26/23 0707 07/27/23 0340  AST 47* 49* 60* 85* 75*  --   --   ALT 16 15 17 22 19   --   --   ALKPHOS 77 63 46 49 67  --   --   BILITOT 7.4* 7.9* 7.5* 7.3* 4.8*  --   --   PROT 6.1* 7.0 6.9 6.2* 6.4*  --   --   ALBUMIN 2.0* 3.8  4.0 3.1* 2.6* 2.6* 2.8*   No results for input(s): "LIPASE", "AMYLASE" in the last 168 hours. Recent Labs  Lab 07/20/23 1448 07/21/23 0828  AMMONIA 84* 84*    ABG    Component Value Date/Time   PHART 7.35 07/21/2023 1726   PCO2ART 44 07/21/2023 1726   PO2ART 90 07/21/2023 1726   HCO3 24.3 07/21/2023 1726   ACIDBASEDEF 1.5 07/21/2023 1726   O2SAT 97.5 07/21/2023 1726     Coagulation Profile: Recent Labs  Lab 07/23/23 0308 07/26/23 0706  INR 2.2* 2.1*    Cardiac Enzymes: No results for input(s): "CKTOTAL", "CKMB", "CKMBINDEX", "TROPONINI" in the last 168 hours.   HbA1C: Hgb A1c MFr Bld  Date/Time Value Ref Range Status  04/24/2021 12:46 AM 5.6 4.8 - 5.6 % Final    Comment:    (NOTE) Pre diabetes:          5.7%-6.4%  Diabetes:              >6.4%  Glycemic control for   <7.0% adults with diabetes     CBG: Recent Labs  Lab 07/26/23 1528 07/26/23 1938 07/26/23 2114 07/26/23 2315  07/27/23 0343  GLUCAP 214* 242* 258* 252* 221*    Allergies No Known Allergies   Home Medications  Prior to Admission medications   Medication Sig Start Date End Date Taking? Authorizing Provider  FLUoxetine (PROZAC) 20 MG capsule Take 1 capsule (20 mg total) by mouth daily. 07/08/23  Yes Claiborne Rigg, NP  lamoTRIgine (LAMICTAL) 100 MG tablet Take 1 tablet (100 mg total) by mouth 2 (two) times daily. 07/08/23  Yes Claiborne Rigg, NP  pregabalin (LYRICA) 100 MG capsule Take 1 capsule (100 mg total) by mouth 2 (two) times daily. 07/08/23  Yes Claiborne Rigg, NP  traZODone (DESYREL) 100 MG tablet Take 1 tablet (100 mg total) by mouth at bedtime. 07/08/23  Yes Claiborne Rigg, NP  venlafaxine XR (EFFEXOR-XR) 150 MG 24 hr capsule Take 1 capsule (150 mg total) by mouth daily with breakfast. 07/08/23  Yes Claiborne Rigg, NP  ARIPiprazole (ABILIFY) 5 MG tablet Take 5 mg by mouth daily. 07/11/23   [provider]  thiamine (VITAMIN B1) 100 MG tablet Take 1 tablet (100 mg total) by mouth daily. Patient not taking: Reported on 07/11/2023 07/08/23   Claiborne Rigg, NP     Critical Care APP time 35 minutes  Glendale Chard, DO [facility] Pulmonary/Critical Care Medicine See Amion for personal pager PCCM on call pager [phone]  07/27/2023 7:38 AM

## 2023-07-26 NOTE — Progress Notes (Signed)
Called [facility] transfer line to discuss possible evaluation for liver transplant per family request.   Spoke with Dr. Eudelia Bunch at [facility] who after hearing recent lab values, overall clinical picture, and recent alcohol abuse, reported she would not be a candidate for transfer at this time. He reports if she could recover to the point of returning home and she demonstrated sobriety, they would be willing to see her outpatient follow up. However, with her having hepatorenal syndrome he agrees with her not being a dialysis candidate at this time.  Called son [name] to discuss conversation. He would still like to proceed with calling [facility] in [place] for possible transfer. Told the son we would call tomorrow.   Glendale Chard, DO 07/26/23 5:08 PM

## 2023-07-26 NOTE — Inpatient Diabetes Management (Signed)
Inpatient Diabetes Program Recommendations  AACE/ADA: New Consensus Statement on Inpatient Glycemic Control (2015)  Target Ranges:  Prepandial:   less than 140 mg/dL      Peak postprandial:   less than 180 mg/dL (1-2 hours)      Critically ill patients:  140 - 180 mg/dL   Lab Results  Component Value Date   GLUCAP 221 (H) 07/26/2023   HGBA1C 5.6 04/24/2021    Review of Glycemic Control  Latest Reference Range & Units 07/25/23 23:35 07/26/23 03:40 07/26/23 11:30  Glucose-Capillary 70 - 99 mg/dL 440 (H) 347 (H) 425 (H)   Diabetes history: None Outpatient Diabetes medications:  None Current orders for Inpatient glycemic control:  Osmolite 1.5 - 40 ml/hr  Inpatient Diabetes Program Recommendations:    Note blood sugars>goal.  Consider adding Novolog sensitive correction q 4 hours while on tube feeds.    Thanks  Beryl Meager, RN, BC-ADM Inpatient Diabetes Coordinator Pager [phone]  (8a-5p)

## 2023-07-26 NOTE — Progress Notes (Signed)
This chaplain responded to PMT NP-Dawn consult for grief resources for the 67yo granddaughter as the family discerns the Pt. next steps. The update from the Pt. RN before the visit shares the son's interest in a second hospital opinion.   The chaplain introduced herself to the Pt. son at the bedside. The Pt. is not responsive to my presence. The chaplain listened reflectively as the Pt. son talked about his journey as the Pt. caregiver and about the miracles in his life. The chaplain understands the son's hope is for the Pt. to wake up and talk to him.   The chaplain understands the Pt. granddaughter visited the Pt. and is beginning to process the Pt. decline and possible hospital death, which is different than the family's previous death experiences. The chaplain's grief resources were declined.  The chaplain offered F/U spiritual care as needed.  Chaplain Stephanie Acre [phone]

## 2023-07-26 NOTE — Progress Notes (Signed)
Telemetry reviewed, no further tachyarrhythmias seen.  In sinus rhythm with PACs.  Cardiology will sign off at this time, please call if any questions.  Little Ishikawa, MD

## 2023-07-26 NOTE — TOC Progression Note (Signed)
Transition of Care [facility]) - Progression Note    Patient Details  Name: Erika [name] MRN: [id number] Date of Birth: [birth year]  Transition of Care [facility]) CM/SW Contact  Tom-Johnson, Hershal Coria, RN Phone Number: 07/26/2023, 2:22 PM  Clinical Narrative:     Patient transferred from 3W to ICU with Altered Mental Status, Hypotension and Bradycardia. Currently on 20L HFNC. On Precedex, IV abx. Palliative following for GOC.  CM unable to assess patient d/t Level Of Consciousness, patient orient to self at this time.   CM will continue to follow as patient progresses with care towards discharge.               Expected Discharge Plan: Home w Home Health Services Barriers to Discharge: Continued Medical Work up  Expected Discharge Plan and Services   Discharge Planning Services: CM Consult Post Acute Care Choice: Home Health Living arrangements for the past 2 months: Single Family Home                 DME Arranged: Bedside commode, Walker rolling         HH Arranged: PT, OT, Nurse's Aide HH Agency: [facility] Date [facility] Agency Contacted: 07/19/23   Representative spoke with at [facility] Agency: Kandee Keen   Social Determinants of Health (SDOH) Interventions SDOH Screenings   Food Insecurity: No Food Insecurity (07/27/2023)  Housing: Low Risk  (07/24/2023)  Transportation Needs: No Transportation Needs (07/07/2023)  Utilities: Not At Risk (07/14/2023)  Depression (PHQ2-9): High Risk (07/08/2023)  Financial Resource Strain: Not on File (07/23/2022)   Received from [place], [place]  Physical Activity: Not on File (07/23/2022)   Received from [place], [place]  Social Connections: Not on File (07/10/2023)   Received from [facility]  Stress: Not on File (07/23/2022)   Received from [place], [place]  Tobacco Use: High Risk (07/05/2023)    Readmission Risk Interventions     No data to display

## 2023-07-26 NOTE — Progress Notes (Signed)
Patient ID: INFANT COTLER, female   DOB: 15-Nov-1955, 67 y.o.   MRN: [id number] S: No events overnight O:BP (!) 116/52   Pulse (!) 50   Temp 97.9 F (36.6 C) (Axillary)   Resp (!) 8   Ht 5\' 1"  (1.549 m)   Wt 72.5 kg   SpO2 99%   BMI 30.20 kg/m   Intake/Output Summary (Last 24 hours) at 07/26/2023 0957 Last data filed at 07/26/2023 0800 Gross per 24 hour  Intake 1495.5 ml  Output 825 ml  Net 670.5 ml   Intake/Output: I/O last 3 completed shifts: In: 3173 [I.V.:661.8; Other:1300; NG/GT:411.3; IV Piggyback:799.9] Out: 2715 [Urine:1215; Stool:1500]  Intake/Output this shift:  Total I/O In: 229.6 [I.V.:69.6; NG/GT:160] Out: -  Weight change:  NWG:NFAOZHYQ, nonverbal CVS: bradycardic at 50 Resp: CTA Abd: +BS, soft, NT/ND Ext: no edema  Recent Labs  Lab 07/20/23 0901 07/21/23 0828 07/22/23 0252 07/23/23 0308 07/23/23 6578 07/23/23 0821 07/24/23 0113 07/24/23 0341 07/24/23 1001 07/24/23 1228 07/24/23 1751 07/24/23 2246 07/26/23 0706 07/26/23 0707  NA 136 138 139 138 141  --   --   --  138  --   --   --   --  143  K 4.7 5.1 4.8 5.2* 4.9   < > 4.4 4.3 4.4 4.2 4.5 4.2  --  4.1  CL 99 102 99 99 95*  --   --   --  103  --   --   --   --  107  CO2 20* 20* 21* 20* 19*  --   --   --  19*  --   --   --   --  20*  GLUCOSE 122* 151* 184* 175* 170*  --   --   --  175*  --   --   --   --  234*  BUN 83* 95* 104* 120* 125*  --   --   --  133*  --   --   --   --  143*  CREATININE 3.21* 3.28* 3.53* 4.12* 4.22*  --   --   --  4.07*  --   --   --   --  3.52*  ALBUMIN 2.2* 2.0* 3.8 4.0  --   --   --   --  3.1*  --   --   --  2.6* 2.6*  CALCIUM 7.2* 7.6* 7.9* 8.0* 8.1*  --   --   --  7.5*  --   --   --   --  8.0*  PHOS 8.1* 9.0*  --  9.6*  --   --   --   --  9.3*  --   --   --  7.4* 7.5*  AST  --  47* 49* 60*  --   --   --   --  85*  --   --   --  75*  --   ALT  --  16 15 17   --   --   --   --  22  --   --   --  19  --    < > = values in this interval not displayed.   Liver  Function Tests: Recent Labs  Lab 07/23/23 0308 07/24/23 1001 07/26/23 0706 07/26/23 0707  AST 60* 85* 75*  --   ALT 17 22 19   --   ALKPHOS 46 49 67  --   BILITOT 7.5* 7.3* 4.8*  --  PROT 6.9 6.2* 6.4*  --   ALBUMIN 4.0 3.1* 2.6* 2.6*   No results for input(s): "LIPASE", "AMYLASE" in the last 168 hours. Recent Labs  Lab 07/20/23 1448 07/21/23 0828  AMMONIA 84* 84*   CBC: Recent Labs  Lab 07/21/23 0828 07/21/23 1914 07/22/23 0252 07/23/23 0308 07/23/23 1220 07/24/23 0341 07/25/23 0432 07/26/23 0706  WBC 13.1*   < > 12.2* 9.5  --  4.8 5.7 6.9  NEUTROABS 11.2*  --  10.0*  --   --   --   --   --   HGB 8.4*   < > 7.3* 6.3*   < > 7.0* 7.2* 7.1*  HCT 25.7*   < > 22.7* 19.6*   < > 21.6* 22.2* 22.7*  MCV 93.1   < > 96.2 98.5  --  95.2 95.7 97.8  PLT 40*   < > 51* 51*  --  35* 30* 40*   < > = values in this interval not displayed.   Cardiac Enzymes: No results for input(s): "CKTOTAL", "CKMB", "CKMBINDEX", "TROPONINI" in the last 168 hours. CBG: Recent Labs  Lab 07/23/23 0455 07/24/23 1953 07/25/23 0340 07/25/23 2335 07/26/23 0340  GLUCAP 145* 129* 129* 200* 198*    Iron Studies: No results for input(s): "IRON", "TIBC", "TRANSFERRIN", "FERRITIN" in the last 72 hours. Studies/Results: US Abdomen Limited RUQ (LIVER/GB)  Result Date: 07/25/2023 CLINICAL DATA:  Cirrhosis EXAM: ULTRASOUND ABDOMEN LIMITED RIGHT UPPER QUADRANT COMPARISON:  None Available. FINDINGS: Gallbladder: No gallstones are seen. Gallbladder sludge is present. There is gallbladder wall thickening measuring up to 4.2 mm. No sonographic Murphy sign noted by sonographer. Common bile duct: Diameter: 4.3 mm Liver: No focal lesion identified. Nodular liver contour is present. There is increase in parenchymal echogenicity. Portal vein is patent on color Doppler imaging with normal direction of blood flow towards the liver. Other: Ascites is present in the upper abdomen. IMPRESSION: 1. Gallbladder sludge with  gallbladder wall thickening. No sonographic Murphy sign. 2. Nodular liver contour with increase in parenchymal echogenicity consistent with cirrhosis. 3. Ascites. Electronically Signed   By: Darliss Cheney M.D.   On: 07/25/2023 20:00   DG Abd Portable 1V  Result Date: 07/25/2023 CLINICAL DATA:  Encounter for feeding tube placement. EXAM: PORTABLE ABDOMEN - 1 VIEW COMPARISON:  Chest radiograph-07/21/2023 FINDINGS: Weighted enteric tube tip overlies expected location of the gastric antrum. Moderate gaseous distention of the colon. Limited visualization of the lower thorax demonstrates bibasilar heterogeneous opacities, left-greater-than-right. No definite acute osseous abnormalities. IMPRESSION: Weighted enteric tube tip overlies expected location of the gastric antrum. Electronically Signed   By: Simonne Come M.D.   On: 07/25/2023 17:17    Chlorhexidine Gluconate Cloth  6 each Topical Daily   dextrose  50 mL Intravenous Once   feeding supplement (PROSource TF20)  60 mL Per Tube BID   FLUoxetine  10 mg Per Tube Daily   insulin aspart  5 Units Subcutaneous Once   lactulose  30 g Per Tube Q4H   midodrine  10 mg Oral Once   midodrine  10 mg Per Tube Q8H   multivitamin with minerals  1 tablet Per Tube Daily   nicotine  7 mg Transdermal Daily   octreotide  200 mcg Subcutaneous Q12H   mouth rinse  15 mL Mouth Rinse 4 times per day   pantoprazole (PROTONIX) IV  40 mg Intravenous Q12H   rifaximin  550 mg Per Tube BID   sodium bicarbonate  50 mEq Intravenous Once  thiamine  100 mg Per Tube Daily    BMET    Component Value Date/Time   NA 143 07/26/2023 0707   NA 141 07/08/2023 1629   K 4.1 07/26/2023 0707   CL 107 07/26/2023 0707   CO2 20 (L) 07/26/2023 0707   GLUCOSE 234 (H) 07/26/2023 0707   BUN 143 (H) 07/26/2023 0707   BUN 18 07/08/2023 1629   CREATININE 3.52 (H) 07/26/2023 0707   CALCIUM 8.0 (L) 07/26/2023 0707   GFRNONAA 14 (L) 07/26/2023 0707   CBC    Component Value Date/Time    WBC 6.9 07/26/2023 0706   RBC 2.32 (L) 07/26/2023 0706   HGB 7.1 (L) 07/26/2023 0706   HGB 10.7 (L) 07/08/2023 1629   HCT 22.7 (L) 07/26/2023 0706   HCT 31.6 (L) 07/08/2023 1629   PLT 40 (L) 07/26/2023 0706   PLT 71 (LL) 07/08/2023 1629   MCV 97.8 07/26/2023 0706   MCV 95 07/08/2023 1629   MCH 30.6 07/26/2023 0706   MCHC 31.3 07/26/2023 0706   RDW 18.8 (H) 07/26/2023 0706   RDW 13.4 07/08/2023 1629   LYMPHSABS 1.5 07/22/2023 0252   LYMPHSABS 0.6 (L) 07/08/2023 1629   MONOABS 0.5 07/22/2023 0252   EOSABS 0.0 07/22/2023 0252   EOSABS 0.0 07/08/2023 1629   BASOSABS 0.0 07/22/2023 0252   BASOSABS 0.0 07/08/2023 1629     Assessment/Plan:   AKI- multifactorial with HRS, cocaine, and NSAIDs with poor po intake as well as acute anemia.  Renal US with increased echogenicity on the right, normal left kidney.  She was started on octreiotide and levophed.  Midodrine stopped due to bradycardia.  UOP did pick up yesterday after IV lasix.  Scr continues to slowly improve from peak of 4.22 to 3.52 today.  She is not a candidate for liver transplant given ongoing Etoh and cocaine.  Would not recommend CRRT or HD and recommend palliative care consult.   Avoid nephrotoxic medications including NSAIDs and iodinated intravenous contrast exposure unless the latter is absolutely indicated.   Preferred narcotic agents for pain control are hydromorphone, fentanyl, and methadone. Morphine should not be used.  Avoid Baclofen and avoid oral sodium phosphate and magnesium citrate based laxatives / bowel preps.  Continue strict Input and Output monitoring. 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  On octreotide and levophed.  No midodrine due to bradycardia.  SVT - cardiology following.  Septic shock - possible pneumonia. On abx and levophed per PCCM Hyperkalemia - resolved.   Thrombocytopenia - due to #2 Anemia - normocytic.  Transfuse for Hgb <7 Acute on chronic CHF - with pulmonary edema.  Lasix given yesterday and may need to redose. PUD - on PPI Disposition - poor overall prognosis. Recommend palliative care consult to help set goals/limits of care.      Irena Cords, MD [facility]

## 2023-07-26 NOTE — Progress Notes (Signed)
Daily Progress Note   Patient Name: Erika [name]       Date: 07/26/2023 DOB: [birth year]  Age: 67 y.o. MRN#: [id number] Attending Physician: Kalman Shan, MD Primary Care Physician: Claiborne Rigg, NP Admit Date: 07/19/2023 Length of Stay: 10 days  Reason for Consultation/Follow-up: Establishing goals of care  HPI/Patient Profile:  67 y.o. female  with past medical history of rheumatoid arthritis, cirrhosis secondary to alcohol abuse, bipolar disorder, history of portal hypertension, GERD, HTN, chronic pain on lyrica admitted on 07/05/2023 with left hip swelling post fall one week prior. She was admitted for acute blood loss anemia due to left hip hematoma and heme positive stool.    She has since developed altered mental status, hypotension, and bradycardia and is in the ICU.  Subjective:   Subjective: Chart Reviewed. Updates received. Patient Assessed. Created space and opportunity for patient  and family to explore thoughts and feelings regarding current medical situation.  Today's Discussion: Today I saw the patient at the bedside.  She has been restarted on Precedex due to agitation and is not able to have meaningful conversation.  I spoke extensively with the medical team including the ICU physician, resident, and nursing team.  The previous plan was for patient's family to visit yesterday and today.  After after that the plan was to consider transition to comfort care.  However, the information from the medical team seems that there may be a change of opinion from family.  They may be trying to retain full scope of care and hope for improvement/care.  Given multiple moving pieces we all agreed that it was best for me to allow time for outcomes.  Plan to follow-up again tomorrow.  Review of Systems  Unable to perform ROS: Acuity of condition    Objective:   Vital Signs:  BP (!) 116/52   Pulse (!) 50   Temp 97.9 F (36.6 C) (Axillary)   Resp (!) 8   Ht 5\' 1"  (1.549 m)    Wt 72.5 kg   SpO2 99%   BMI 30.20 kg/m   Physical Exam: Physical Exam Vitals and nursing note reviewed.  Constitutional:      General: She is sleeping. She is not in acute distress. HENT:     Head: Normocephalic and atraumatic.  Cardiovascular:     Rate and Rhythm: Normal rate.  Pulmonary:     Effort: Pulmonary effort is normal. No respiratory distress.  Abdominal:     General: There is distension.  Skin:    General: Skin is warm and dry.     Coloration: Skin is jaundiced.  Neurological:     Mental Status: She is disoriented.     Palliative Assessment/Data: 10-20%    Existing Vynca/ACP Documentation: None  Assessment & Plan:   Impression: Present on Admission:  Blood loss anemia  Rheumatoid arthritis (HCC)  Thrombocytopenia (HCC)  Alcohol use disorder  GERD (gastroesophageal reflux disease)  Bipolar 2 disorder (HCC)  Prolonged QT interval  Cardiomegaly  Occult blood positive stool  Tobacco abuse  Encephalopathy acute  67 year old female with comorbidities and acute presentation as described above.  She remains off evaluator but on high flow nasal cannula, mild to minimal improvement in encephalopathy.  She has a [place] place now and is receiving lactulose and Xifaxan.  Levophed is now off but continue with octreotide and Precedex.  Is also receiving tube feedings.  MELD score with mild improvement today to 33 which suggest a 58.7% 90-day mortality rate.  Family  seems understand how sick she is, but may be opting towards continued full scope of care hopeful for improvement.  At this point we will allow time for outcomes and continue to support the patient and family.  MELD 33 (58.1% 90-day mortality) Child-Pugh C   SUMMARY OF RECOMMENDATIONS   Continued support of patient and family Time for outcomes Continued support of patient and family Will follow-up tomorrow for further discussions PMT will continue to follow  Symptom Management:  Per primary  team PMT is available to assist as needed  Code Status: Full code  Prognosis: Unable to determine  Discharge Planning: To Be Determined  Discussed with: Medical team, nursing team, [facility] team  Thank you for allowing [place] to participate in the care of Erika [name] PMT will continue to support holistically.  Time Total: 40 min  Detailed review of medical records (labs, imaging, vital signs), medically appropriate exam, discussed with treatment team, counseling and education to patient, family, & staff, documenting clinical information, medication management, coordination of care  Wynne Dust, NP Palliative Medicine Team  Team Phone # [phone] (Nights/Weekends)  06/30/2021, 8:17 AM

## 2023-07-26 NOTE — Progress Notes (Signed)
   PM Rounds  -Molli Knock remains on Precedex.  Got agitated without it.  Continues on tube feeds.  MELD score is some better because the creatinine is some better but the bilirubin is still the same.  Her lactic acid is unexpectedly higher.  Son wants [place] to check with [facility] or [facility] about transplant capabilities and if she meets criteria for liver trans plantation  -She will not be a transplant candidate because of ongoing ethanol and cocaine but we will check with the transfer center  -For lactic acidosis we will give her a small fluid challenge and also albumin and then recheck.     SIGNATURE    Dr. Kalman Shan, M.D., F.C.C.P,  Pulmonary and Critical Care Medicine Staff Physician, [facility] Center Director - Interstitial Lung Disease  Program  Pulmonary Fibrosis [facility] at [facility] [place], [place], [address]   Pager: [phone], If no answer  -> Check AMION or Try [phone] Telephone (clinical office): [phone] Telephone (research): [phone]  4:27 PM 07/26/2023

## 2023-07-26 NOTE — Progress Notes (Signed)
OT Cancellation Note  Patient Details Name: Erika [name] MRN: [id number] DOB: [birth year]   Cancelled Treatment:    Reason Eval/Treat Not Completed: Medical issues which prohibited therapy;Patient not medically ready (Per RN, pt is not appropriate for therapy. OT to sign off and await new consult if pt's status imrpoves.)  Donia Pounds 07/26/2023, 12:22 PM

## 2023-07-26 NOTE — Progress Notes (Signed)
Updated son at bedside on clinical progress. He expressed wanting a second opinion from either [facility] or [facility] for potential transplant. He is understanding that her prognosis is very poor, but he is wanting to exhaust all resources before taking her home with hospice.   Glendale Chard, DO 07/26/23 3:06 PM

## 2023-07-27 DIAGNOSIS — D500 Iron deficiency anemia secondary to blood loss (chronic): Secondary | ICD-10-CM | POA: Diagnosis not present

## 2023-07-27 DIAGNOSIS — G9340 Encephalopathy, unspecified: Secondary | ICD-10-CM | POA: Diagnosis not present

## 2023-07-27 DIAGNOSIS — N179 Acute kidney failure, unspecified: Secondary | ICD-10-CM | POA: Diagnosis not present

## 2023-07-27 LAB — CBC
HCT: 25.8 % — ABNORMAL LOW (ref 36.0–46.0)
Hemoglobin: 8.2 g/dL — ABNORMAL LOW (ref 12.0–15.0)
MCH: 32 pg (ref 26.0–34.0)
MCHC: 31.8 g/dL (ref 30.0–36.0)
MCV: 100.8 fL — ABNORMAL HIGH (ref 80.0–100.0)
Platelets: 41 10*3/uL — ABNORMAL LOW (ref 150–400)
RBC: 2.56 MIL/uL — ABNORMAL LOW (ref 3.87–5.11)
RDW: 19.5 % — ABNORMAL HIGH (ref 11.5–15.5)
WBC: 8.4 10*3/uL (ref 4.0–10.5)
nRBC: 0 % (ref 0.0–0.2)

## 2023-07-27 LAB — RENAL FUNCTION PANEL
Albumin: 2.8 g/dL — ABNORMAL LOW (ref 3.5–5.0)
Anion gap: 15 (ref 5–15)
BUN: 145 mg/dL — ABNORMAL HIGH (ref 8–23)
CO2: 23 mmol/L (ref 22–32)
Calcium: 8.4 mg/dL — ABNORMAL LOW (ref 8.9–10.3)
Chloride: 112 mmol/L — ABNORMAL HIGH (ref 98–111)
Creatinine, Ser: 3 mg/dL — ABNORMAL HIGH (ref 0.44–1.00)
GFR, Estimated: 17 mL/min — ABNORMAL LOW (ref >60)
Glucose, Bld: 235 mg/dL — ABNORMAL HIGH (ref 70–99)
Phosphorus: 6.1 mg/dL — ABNORMAL HIGH (ref 2.5–4.6)
Potassium: 4.2 mmol/L (ref 3.5–5.1)
Sodium: 150 mmol/L — ABNORMAL HIGH (ref 135–145)

## 2023-07-27 LAB — GLUCOSE, CAPILLARY
Glucose-Capillary: 221 mg/dL — ABNORMAL HIGH (ref 70–99)
Glucose-Capillary: 204 mg/dL — ABNORMAL HIGH (ref 70–99)
Glucose-Capillary: 256 mg/dL — ABNORMAL HIGH (ref 70–99)
Glucose-Capillary: 245 mg/dL — ABNORMAL HIGH (ref 70–99)
Glucose-Capillary: 243 mg/dL — ABNORMAL HIGH (ref 70–99)

## 2023-07-27 LAB — LACTIC ACID, PLASMA: Lactic Acid, Venous: 3.8 mmol/L (ref 0.5–1.9)

## 2023-07-27 MED ORDER — FREE WATER
400.0000 mL | Freq: Three times a day (TID) | Status: DC
  Administered 2023-07-27 – 2023-07-28 (×3): 400 mL

## 2023-07-27 MED ORDER — LACTULOSE 10 GM/15ML PO SOLN
30.0000 g | Freq: Three times a day (TID) | ORAL | Status: DC
  Administered 2023-07-27 – 2023-07-29 (×6): 30 g
  Filled 2023-07-27 (×6): qty 60

## 2023-07-27 MED ORDER — CEFAZOLIN SODIUM-DEXTROSE 2-4 GM/100ML-% IV SOLN
2.0000 g | Freq: Two times a day (BID) | INTRAVENOUS | Status: DC
  Administered 2023-07-27 – 2023-07-28 (×2): 2 g via INTRAVENOUS
  Filled 2023-07-27 (×2): qty 100

## 2023-07-27 MED ORDER — ACETAMINOPHEN 325 MG PO TABS
650.0000 mg | ORAL_TABLET | ORAL | Status: DC | PRN
  Administered 2023-07-27 (×2): 650 mg
  Filled 2023-07-27: qty 2

## 2023-07-27 MED ORDER — MIDODRINE HCL 5 MG PO TABS
5.0000 mg | ORAL_TABLET | Freq: Three times a day (TID) | ORAL | Status: DC
  Filled 2023-07-27 (×2): qty 1

## 2023-07-27 MED ORDER — ACETAMINOPHEN 325 MG PO TABS
650.0000 mg | ORAL_TABLET | ORAL | Status: DC | PRN
  Filled 2023-07-27: qty 2

## 2023-07-27 MED ORDER — INSULIN ASPART 100 UNIT/ML IJ SOLN
0.0000 [IU] | INTRAMUSCULAR | Status: DC
  Administered 2023-07-27 (×2): 5 [IU] via SUBCUTANEOUS
  Administered 2023-07-27 – 2023-07-28 (×2): 8 [IU] via SUBCUTANEOUS
  Administered 2023-07-28 (×3): 5 [IU] via SUBCUTANEOUS
  Administered 2023-07-28: 8 [IU] via SUBCUTANEOUS
  Administered 2023-07-28: 5 [IU] via SUBCUTANEOUS
  Administered 2023-07-28: 8 [IU] via SUBCUTANEOUS
  Administered 2023-07-29 (×3): 5 [IU] via SUBCUTANEOUS

## 2023-07-27 NOTE — TOC Progression Note (Signed)
Transition of Care [facility]) - Progression Note    Patient Details  Name: Erika [name] MRN: [id number] Date of Birth: [birth year]  Transition of Care [facility]) CM/SW Contact  Tom-Johnson, Hershal Coria, RN Phone Number: 07/27/2023, 2:20 PM  Clinical Narrative:     Patient continues to be Intubated and Sedated. Patient's Lactid Acid increasing despite fluid resuscitation.  [facility] [place], [place] declined transfer for Liver Transplant d/t patient declining and lack of sobriety. Nephrology signed off, recommends Palliative for GOC discussion.    CM will continue to follow.      Expected Discharge Plan: Home w Home Health Services Barriers to Discharge: Continued Medical Work up  Expected Discharge Plan and Services   Discharge Planning Services: CM Consult Post Acute Care Choice: Home Health Living arrangements for the past 2 months: Single Family Home                 DME Arranged: Bedside commode, Walker rolling         HH Arranged: PT, OT, Nurse's Aide HH Agency: [facility] Date [facility] Agency Contacted: 07/19/23   Representative spoke with at [facility] Agency: Kandee Keen   Social Determinants of Health (SDOH) Interventions SDOH Screenings   Food Insecurity: No Food Insecurity (07/10/2023)  Housing: Low Risk  (07/10/2023)  Transportation Needs: No Transportation Needs (07/30/2023)  Utilities: Not At Risk (07/10/2023)  Depression (PHQ2-9): High Risk (07/08/2023)  Financial Resource Strain: Not on File (07/23/2022)   Received from [place], [place]  Physical Activity: Not on File (07/23/2022)   Received from [place], [place]  Social Connections: Not on File (07/10/2023)   Received from [facility]  Stress: Not on File (07/23/2022)   Received from [place], [place]  Tobacco Use: High Risk (07/28/2023)    Readmission Risk Interventions     No data to display

## 2023-07-27 NOTE — Progress Notes (Signed)
     Referral received for Erika [name] for goals of care discussion. Chart reviewed and updates received from RN. Patient assessed and is unable to engage appropriately in discussions.   I spoke with the medical team and nursing team including medical resident Dr. Glendale Chard.  Family has requested second opinions from [facility] and [facility] in [place] to consider transfer for liver transplant.  The team has reached out to [facility] who is denied candidacy for transplant.  Awaiting callback from [facility] today.  They are expecting to hear back from [facility] around 2 or 3:00 this afternoon.  I informed them that after the decision was communicated that I would reach out to schedule a time to meet with them further.  Later in the afternoon it appears that [facility] is also declined transfer for liver transplant.  However, a HIPAA appropriate voicemail was left with the patient's son but it does not appear that communication has been confirmed.  Due to this I will hold off until the medical team is able to communicate [facility] decision.  I will plan to follow-up tomorrow for discussions on how to proceed given no forward option for liver transplantation  PMT will re-attempt to contact family at a later time/date. Detailed note and recommendations to follow once GOC has been completed.   Thank you for your referral and allowing PMT to assist in Erika [name]'s care.   Wynne Dust, NP Palliative Medicine Team Phone: [phone]  NO CHARGE

## 2023-07-27 NOTE — Plan of Care (Signed)
Problem: Coping: Goal: Level of anxiety will decrease Outcome: Not Progressing

## 2023-07-27 NOTE — Progress Notes (Signed)
Patient ID: Erika [name], female   DOB: 07-06-1956, 67 y.o.   MRN: [id number] S: no events overnight. O:BP (!) 153/46   Pulse 60   Temp 98.4 F (36.9 C) (Axillary)   Resp 15   Ht 5\' 1"  (1.549 m)   Wt 72.5 kg   SpO2 98%   BMI 30.20 kg/m   Intake/Output Summary (Last 24 hours) at 07/27/2023 1232 Last data filed at 07/27/2023 1000 Gross per 24 hour  Intake 2142.65 ml  Output 2650 ml  Net -507.35 ml   Intake/Output: I/O last 3 completed shifts: In: 3119.9 [I.V.:694.3; GU/YQ:0347; IV Piggyback:839.6] Out: 2625 [Urine:1225; Stool:1400]  Intake/Output this shift:  Total I/O In: 201.7 [I.V.:81.7; NG/GT:120] Out: 1000 [Stool:1000] Weight change:  QQV:[id number] and nonverbal, agitated CVS:RRR Resp: CTA Abd: +BS, soft, NT Ext: no edema  Recent Labs  Lab 07/21/23 0828 07/22/23 0252 07/23/23 0308 07/23/23 9518 07/23/23 0821 07/24/23 0341 07/24/23 1001 07/24/23 1228 07/24/23 1751 07/24/23 2246 07/26/23 0706 07/26/23 0707 07/27/23 0340  NA 138 139 138 141  --   --  138  --   --   --   --  143 150*  K 5.1 4.8 5.2* 4.9   < > 4.3 4.4 4.2 4.5 4.2  --  4.1 4.2  CL 102 99 99 95*  --   --  103  --   --   --   --  107 112*  CO2 20* 21* 20* 19*  --   --  19*  --   --   --   --  20* 23  GLUCOSE 151* 184* 175* 170*  --   --  175*  --   --   --   --  234* 235*  BUN 95* 104* 120* 125*  --   --  133*  --   --   --   --  143* 145*  CREATININE 3.28* 3.53* 4.12* 4.22*  --   --  4.07*  --   --   --   --  3.52* 3.00*  ALBUMIN 2.0* 3.8 4.0  --   --   --  3.1*  --   --   --  2.6* 2.6* 2.8*  CALCIUM 7.6* 7.9* 8.0* 8.1*  --   --  7.5*  --   --   --   --  8.0* 8.4*  PHOS 9.0*  --  9.6*  --   --   --  9.3*  --   --   --  7.4* 7.5* 6.1*  AST 47* 49* 60*  --   --   --  85*  --   --   --  75*  --   --   ALT 16 15 17   --   --   --  22  --   --   --  19  --   --    < > = values in this interval not displayed.   Liver Function Tests: Recent Labs  Lab 07/23/23 0308 07/24/23 1001  07/26/23 0706 07/26/23 0707 07/27/23 0340  AST 60* 85* 75*  --   --   ALT 17 22 19   --   --   ALKPHOS 46 49 67  --   --   BILITOT 7.5* 7.3* 4.8*  --   --   PROT 6.9 6.2* 6.4*  --   --   ALBUMIN 4.0 3.1* 2.6* 2.6* 2.8*   No results for input(s): "LIPASE", "AMYLASE"  in the last 168 hours. Recent Labs  Lab 07/20/23 1448 07/21/23 0828  AMMONIA 84* 84*   CBC: Recent Labs  Lab 07/21/23 0828 07/21/23 1914 07/22/23 0252 07/23/23 0308 07/23/23 1220 07/24/23 0341 07/25/23 0432 07/26/23 0706 07/27/23 0340  WBC 13.1*   < > 12.2* 9.5  --  4.8 5.7 6.9 8.4  NEUTROABS 11.2*  --  10.0*  --   --   --   --   --   --   HGB 8.4*   < > 7.3* 6.3*   < > 7.0* 7.2* 7.1* 8.2*  HCT 25.7*   < > 22.7* 19.6*   < > 21.6* 22.2* 22.7* 25.8*  MCV 93.1   < > 96.2 98.5  --  95.2 95.7 97.8 100.8*  PLT 40*   < > 51* 51*  --  35* 30* 40* 41*   < > = values in this interval not displayed.   Cardiac Enzymes: No results for input(s): "CKTOTAL", "CKMB", "CKMBINDEX", "TROPONINI" in the last 168 hours. CBG: Recent Labs  Lab 07/26/23 1938 07/26/23 2114 07/26/23 2315 07/27/23 0343 07/27/23 0747  GLUCAP 242* 258* 252* 221* 204*    Iron Studies: No results for input(s): "IRON", "TIBC", "TRANSFERRIN", "FERRITIN" in the last 72 hours. Studies/Results: DG CHEST PORT 1 VIEW  Result Date: 07/26/2023 CLINICAL DATA:  [id number] Acute hypoxemic respiratory failure (HCC) [id number] EXAM: PORTABLE CHEST 1 VIEW COMPARISON:  07/21/2023 FINDINGS: Interval placement of enteric tube which courses below the diaphragm with distal tip beyond the inferior margin of the film. Stable cardiomegaly. Pulmonary vascular congestion. Persistent but improving interstitial opacities bilaterally. No large pleural fluid collection. No pneumothorax. IMPRESSION: 1. Persistent but improving pulmonary edema. 2. Interval placement of enteric tube which courses below the diaphragm with distal tip beyond the inferior margin of the film. Electronically  Signed   By: Duanne Guess D.O.   On: 07/26/2023 12:41   US Abdomen Limited RUQ (LIVER/GB)  Result Date: 07/25/2023 CLINICAL DATA:  Cirrhosis EXAM: ULTRASOUND ABDOMEN LIMITED RIGHT UPPER QUADRANT COMPARISON:  None Available. FINDINGS: Gallbladder: No gallstones are seen. Gallbladder sludge is present. There is gallbladder wall thickening measuring up to 4.2 mm. No sonographic Murphy sign noted by sonographer. Common bile duct: Diameter: 4.3 mm Liver: No focal lesion identified. Nodular liver contour is present. There is increase in parenchymal echogenicity. Portal vein is patent on color Doppler imaging with normal direction of blood flow towards the liver. Other: Ascites is present in the upper abdomen. IMPRESSION: 1. Gallbladder sludge with gallbladder wall thickening. No sonographic Murphy sign. 2. Nodular liver contour with increase in parenchymal echogenicity consistent with cirrhosis. 3. Ascites. Electronically Signed   By: Darliss Cheney M.D.   On: 07/25/2023 20:00   DG Abd Portable 1V  Result Date: 07/25/2023 CLINICAL DATA:  Encounter for feeding tube placement. EXAM: PORTABLE ABDOMEN - 1 VIEW COMPARISON:  Chest radiograph-07/21/2023 FINDINGS: Weighted enteric tube tip overlies expected location of the gastric antrum. Moderate gaseous distention of the colon. Limited visualization of the lower thorax demonstrates bibasilar heterogeneous opacities, left-greater-than-right. No definite acute osseous abnormalities. IMPRESSION: Weighted enteric tube tip overlies expected location of the gastric antrum. Electronically Signed   By: Simonne Come M.D.   On: 07/25/2023 17:17    Chlorhexidine Gluconate Cloth  6 each Topical Daily   feeding supplement (PROSource TF20)  60 mL Per Tube BID   FLUoxetine  10 mg Per Tube Daily   free water  400 mL Per Tube Q8H   insulin  aspart  0-15 Units Subcutaneous Q4H   lactulose  30 g Per Tube Q8H   lamoTRIgine  25 mg Per Tube Daily   Followed by   Melene Muller ON  08/09/2023] lamoTRIgine  50 mg Per Tube Daily   Followed by   Melene Muller ON 08/23/2023] lamoTRIgine  50 mg Per Tube BID   Followed by   Melene Muller ON 08/30/2023] lamoTRIgine  100 mg Per Tube BID   midodrine  5 mg Per Tube Q8H   multivitamin with minerals  1 tablet Per Tube Daily   nicotine  7 mg Transdermal Daily   octreotide  200 mcg Subcutaneous Q12H   mouth rinse  15 mL Mouth Rinse 4 times per day   pantoprazole (PROTONIX) IV  40 mg Intravenous Q12H   rifaximin  550 mg Per Tube BID   thiamine  100 mg Per Tube Daily    BMET    Component Value Date/Time   NA 150 (H) 07/27/2023 0340   NA 141 07/08/2023 1629   K 4.2 07/27/2023 0340   CL 112 (H) 07/27/2023 0340   CO2 23 07/27/2023 0340   GLUCOSE 235 (H) 07/27/2023 0340   BUN 145 (H) 07/27/2023 0340   BUN 18 07/08/2023 1629   CREATININE 3.00 (H) 07/27/2023 0340   CALCIUM 8.4 (L) 07/27/2023 0340   GFRNONAA 17 (L) 07/27/2023 0340   CBC    Component Value Date/Time   WBC 8.4 07/27/2023 0340   RBC 2.56 (L) 07/27/2023 0340   HGB 8.2 (L) 07/27/2023 0340   HGB 10.7 (L) 07/08/2023 1629   HCT 25.8 (L) 07/27/2023 0340   HCT 31.6 (L) 07/08/2023 1629   PLT 41 (L) 07/27/2023 0340   PLT 71 (LL) 07/08/2023 1629   MCV 100.8 (H) 07/27/2023 0340   MCV 95 07/08/2023 1629   MCH 32.0 07/27/2023 0340   MCHC 31.8 07/27/2023 0340   RDW 19.5 (H) 07/27/2023 0340   RDW 13.4 07/08/2023 1629   LYMPHSABS 1.5 07/22/2023 0252   LYMPHSABS 0.6 (L) 07/08/2023 1629   MONOABS 0.5 07/22/2023 0252   EOSABS 0.0 07/22/2023 0252   EOSABS 0.0 07/08/2023 1629   BASOSABS 0.0 07/22/2023 0252   BASOSABS 0.0 07/08/2023 1629     Assessment/Plan:   AKI- multifactorial with HRS, cocaine, and NSAIDs with poor po intake as well as acute anemia.  Renal US with increased echogenicity on the right, normal left kidney.  She was started on octreiotide and levophed.  Midodrine stopped due to bradycardia.  UOP did pick up yesterday after IV lasix.  Scr continues to slowly  improve from peak of 4.22 to 3 today.  She is not a candidate for liver transplant given ongoing Etoh and cocaine.  Would not recommend CRRT or HD and recommend palliative care consult.   Nothing further to add.  Will sign off for now.  Please call with questions or concerns. Avoid nephrotoxic medications including NSAIDs and iodinated intravenous contrast exposure unless the latter is absolutely indicated.   Preferred narcotic agents for pain control are hydromorphone, fentanyl, and methadone. Morphine should not be used.  Avoid Baclofen and avoid oral sodium phosphate and magnesium citrate based laxatives / bowel preps.  Continue strict Input and Output monitoring. 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  On octreotide and levophed.  No midodrine due to bradycardia.  Hypernatremia - started on free water boluses  via NGT and decreased lactulose due to high output. SVT - cardiology following.  Septic shock - possible pneumonia. On abx and levophed per PCCM Hyperkalemia - resolved.  Thrombocytopenia - due to #2 Anemia - normocytic.  Transfuse for Hgb <7 Acute on chronic CHF - with pulmonary edema.  Lasix given yesterday and may need to redose. PUD - on PPI Disposition - poor overall prognosis. Recommend palliative care consult to help set goals/limits of care.      Irena Cords, MD [facility]

## 2023-07-27 NOTE — Progress Notes (Signed)
Spoke with [facility] transplant team regarding transfer for potential liver transplant evaluation.   Upon reviewing the patient's known alcohol abuse history and documented note from 06/2023 where an outpatient provider recommended stopping alcohol and starting a sobriety program. Due to the patient declining that precludes her from being consider for transplant.   [facility] has declined transfer for transplant.   Called son [name], but was unable to reach. Left HIPAA compliant voicemail.  Glendale Chard, DO 07/27/23 1:40 PM

## 2023-07-28 DIAGNOSIS — N179 Acute kidney failure, unspecified: Secondary | ICD-10-CM | POA: Diagnosis not present

## 2023-07-28 DIAGNOSIS — Z7189 Other specified counseling: Secondary | ICD-10-CM | POA: Diagnosis not present

## 2023-07-28 DIAGNOSIS — D500 Iron deficiency anemia secondary to blood loss (chronic): Secondary | ICD-10-CM | POA: Diagnosis not present

## 2023-07-28 DIAGNOSIS — Z515 Encounter for palliative care: Secondary | ICD-10-CM | POA: Diagnosis not present

## 2023-07-28 LAB — GLUCOSE, CAPILLARY
Glucose-Capillary: 233 mg/dL — ABNORMAL HIGH (ref 70–99)
Glucose-Capillary: 235 mg/dL — ABNORMAL HIGH (ref 70–99)
Glucose-Capillary: 246 mg/dL — ABNORMAL HIGH (ref 70–99)
Glucose-Capillary: 247 mg/dL — ABNORMAL HIGH (ref 70–99)
Glucose-Capillary: 258 mg/dL — ABNORMAL HIGH (ref 70–99)
Glucose-Capillary: 264 mg/dL — ABNORMAL HIGH (ref 70–99)
Glucose-Capillary: 277 mg/dL — ABNORMAL HIGH (ref 70–99)

## 2023-07-28 LAB — HEMOGLOBIN A1C
Hgb A1c MFr Bld: 5.2 % (ref 4.8–5.6)
Mean Plasma Glucose: 102.54 mg/dL

## 2023-07-28 LAB — COMPREHENSIVE METABOLIC PANEL
ALT: 10 U/L (ref 0–44)
ALT: 12 U/L (ref 0–44)
AST: 70 U/L — ABNORMAL HIGH (ref 15–41)
AST: 71 U/L — ABNORMAL HIGH (ref 15–41)
Albumin: 2.6 g/dL — ABNORMAL LOW (ref 3.5–5.0)
Albumin: 2.7 g/dL — ABNORMAL LOW (ref 3.5–5.0)
Alkaline Phosphatase: 92 U/L (ref 38–126)
Alkaline Phosphatase: 85 U/L (ref 38–126)
Anion gap: 13 (ref 5–15)
Anion gap: 14 (ref 5–15)
BUN: 151 mg/dL — ABNORMAL HIGH (ref 8–23)
BUN: 165 mg/dL — ABNORMAL HIGH (ref 8–23)
CO2: 23 mmol/L (ref 22–32)
CO2: 21 mmol/L — ABNORMAL LOW (ref 22–32)
Calcium: 8.7 mg/dL — ABNORMAL LOW (ref 8.9–10.3)
Calcium: 8.7 mg/dL — ABNORMAL LOW (ref 8.9–10.3)
Chloride: 116 mmol/L — ABNORMAL HIGH (ref 98–111)
Chloride: 115 mmol/L — ABNORMAL HIGH (ref 98–111)
Creatinine, Ser: 2.69 mg/dL — ABNORMAL HIGH (ref 0.44–1.00)
Creatinine, Ser: 2.61 mg/dL — ABNORMAL HIGH (ref 0.44–1.00)
GFR, Estimated: 19 mL/min — ABNORMAL LOW (ref >60)
GFR, Estimated: 20 mL/min — ABNORMAL LOW (ref >60)
Glucose, Bld: 290 mg/dL — ABNORMAL HIGH (ref 70–99)
Glucose, Bld: 286 mg/dL — ABNORMAL HIGH (ref 70–99)
Potassium: 4.3 mmol/L (ref 3.5–5.1)
Potassium: 4 mmol/L (ref 3.5–5.1)
Sodium: 152 mmol/L — ABNORMAL HIGH (ref 135–145)
Sodium: 150 mmol/L — ABNORMAL HIGH (ref 135–145)
Total Bilirubin: 3.7 mg/dL — ABNORMAL HIGH (ref 0.3–1.2)
Total Bilirubin: 3.2 mg/dL — ABNORMAL HIGH (ref 0.3–1.2)
Total Protein: 6.6 g/dL (ref 6.5–8.1)
Total Protein: 6.3 g/dL — ABNORMAL LOW (ref 6.5–8.1)

## 2023-07-28 LAB — RENAL FUNCTION PANEL
Albumin: 2.5 g/dL — ABNORMAL LOW (ref 3.5–5.0)
Anion gap: 12 (ref 5–15)
BUN: 150 mg/dL — ABNORMAL HIGH (ref 8–23)
CO2: 24 mmol/L (ref 22–32)
Calcium: 8.7 mg/dL — ABNORMAL LOW (ref 8.9–10.3)
Chloride: 116 mmol/L — ABNORMAL HIGH (ref 98–111)
Creatinine, Ser: 2.59 mg/dL — ABNORMAL HIGH (ref 0.44–1.00)
GFR, Estimated: 20 mL/min — ABNORMAL LOW (ref >60)
Glucose, Bld: 289 mg/dL — ABNORMAL HIGH (ref 70–99)
Phosphorus: 5.1 mg/dL — ABNORMAL HIGH (ref 2.5–4.6)
Potassium: 4.2 mmol/L (ref 3.5–5.1)
Sodium: 152 mmol/L — ABNORMAL HIGH (ref 135–145)

## 2023-07-28 LAB — AMYLASE: Amylase: 51 U/L (ref 28–100)

## 2023-07-28 LAB — CK: Total CK: 169 U/L (ref 38–234)

## 2023-07-28 LAB — AMMONIA: Ammonia: 44 umol/L — ABNORMAL HIGH (ref 9–35)

## 2023-07-28 LAB — MAGNESIUM
Magnesium: 2.5 mg/dL — ABNORMAL HIGH (ref 1.7–2.4)
Magnesium: 2.3 mg/dL (ref 1.7–2.4)

## 2023-07-28 LAB — PROTIME-INR
INR: 2.2 — ABNORMAL HIGH (ref 0.8–1.2)
Prothrombin Time: 24.6 seconds — ABNORMAL HIGH (ref 11.4–15.2)

## 2023-07-28 LAB — LIPASE, BLOOD: Lipase: 63 U/L — ABNORMAL HIGH (ref 11–51)

## 2023-07-28 LAB — LACTIC ACID, PLASMA: Lactic Acid, Venous: 3.8 mmol/L (ref 0.5–1.9)

## 2023-07-28 LAB — PHOSPHORUS: Phosphorus: 5 mg/dL — ABNORMAL HIGH (ref 2.5–4.6)

## 2023-07-28 MED ORDER — FUROSEMIDE 10 MG/ML IJ SOLN
40.0000 mg | Freq: Once | INTRAMUSCULAR | Status: AC
  Administered 2023-07-28: 40 mg via INTRAVENOUS
  Filled 2023-07-28: qty 4

## 2023-07-28 MED ORDER — INSULIN GLARGINE-YFGN 100 UNIT/ML ~~LOC~~ SOLN
15.0000 [IU] | Freq: Every day | SUBCUTANEOUS | Status: DC
  Administered 2023-07-28 – 2023-07-29 (×2): 15 [IU] via SUBCUTANEOUS
  Filled 2023-07-28 (×2): qty 0.15

## 2023-07-28 MED ORDER — METOLAZONE 5 MG PO TABS
5.0000 mg | ORAL_TABLET | Freq: Once | ORAL | Status: AC
  Administered 2023-07-28: 5 mg via ORAL
  Filled 2023-07-28: qty 1

## 2023-07-28 MED ORDER — CEFAZOLIN SODIUM-DEXTROSE 2-4 GM/100ML-% IV SOLN
2.0000 g | Freq: Two times a day (BID) | INTRAVENOUS | Status: AC
  Administered 2023-07-28: 2 g via INTRAVENOUS
  Filled 2023-07-28: qty 100

## 2023-07-28 MED ORDER — MIDODRINE HCL 5 MG PO TABS
2.5000 mg | ORAL_TABLET | Freq: Three times a day (TID) | ORAL | Status: DC
  Filled 2023-07-28: qty 1

## 2023-07-28 MED ORDER — ALBUMIN HUMAN 25 % IV SOLN
50.0000 g | Freq: Four times a day (QID) | INTRAVENOUS | Status: AC
  Administered 2023-07-28 – 2023-07-29 (×4): 50 g via INTRAVENOUS
  Filled 2023-07-28 (×4): qty 200

## 2023-07-28 MED ORDER — MIDODRINE HCL 5 MG PO TABS
2.5000 mg | ORAL_TABLET | Freq: Three times a day (TID) | ORAL | Status: DC
  Administered 2023-07-28 (×2): 2.5 mg
  Filled 2023-07-28 (×2): qty 1

## 2023-07-28 MED ORDER — MIDODRINE HCL 5 MG PO TABS
5.0000 mg | ORAL_TABLET | Freq: Three times a day (TID) | ORAL | Status: DC
  Administered 2023-07-28 – 2023-07-29 (×2): 5 mg
  Filled 2023-07-28 (×2): qty 1

## 2023-07-28 MED ORDER — MIDODRINE HCL 5 MG PO TABS
5.0000 mg | ORAL_TABLET | Freq: Three times a day (TID) | ORAL | Status: DC
  Administered 2023-07-28: 5 mg
  Filled 2023-07-28: qty 1

## 2023-07-28 NOTE — Plan of Care (Signed)
Problem: Education: Goal: Knowledge of General Education information will improve Description: Including pain rating scale, medication(s)/side effects and non-pharmacologic comfort measures Outcome: Not Progressing   Problem: Health Behavior/Discharge Planning: Goal: Ability to manage health-related needs will improve Outcome: Not Progressing

## 2023-07-28 NOTE — Progress Notes (Signed)
Daily Progress Note   Patient Name: Erika [name]       Date: 07/28/2023 DOB: 31-Mar-1956  Age: 67 y.o. MRN#: [id number] Attending Physician: Martina Sinner, MD Primary Care Physician: Claiborne Rigg, NP Admit Date: 19-Jul-2023 Length of Stay: 12 days  Reason for Consultation/Follow-up: Establishing goals of care  HPI/Patient Profile:  67 y.o. female  with past medical history of rheumatoid arthritis, cirrhosis secondary to alcohol abuse, bipolar disorder, history of portal hypertension, GERD, HTN, chronic pain on lyrica admitted on 07-19-23 with left hip swelling post fall one week prior. She was admitted for acute blood loss anemia due to left hip hematoma and heme positive stool.    She has since developed altered mental status, hypotension, and bradycardia and is in the ICU.  Subjective:   Subjective: Chart Reviewed. Updates received. Patient Assessed. Created space and opportunity for patient  and family to explore thoughts and feelings regarding current medical situation.  Today's Discussion: Today I saw the patient at the bedside, no family was present.  She appears to be resting comfortably, currently on Precedex.  She is receiving albumin and normal saline.  I did not attempt to arouse her to avoid anxiety or agitation.  I spoke with the medical team and nursing team.  They informed me that all transplant centers have declined transfer for consideration of transplant.  Family is now aware of this.  I did reach out and speak with the patient's son Erika [name].  I explained palliative medicine and our role in her care.  I limited the unfortunate news that she does not have an option for transplant.  We discussed that we should talk about our options moving forward.  He brought up home hospice and states that his mother had a desire to not die in the hospital.  I scheduled a time to meet with him tomorrow when he would be at the hospital around 1:00.  I offered that we would talk  to each of our options and make our best attempt to meet her wishes but not risk of discomfort.  I explained that my hesitation would be if clinically we feel that we could not manage her symptoms adequately at home.  He verbalized understanding.  I provided emotional and general support through therapeutic listening, empathy, sharing of stories, and other techniques. I answered all questions and addressed all concerns to the best of my ability.   Review of Systems  Unable to perform ROS: Acuity of condition    Objective:   Vital Signs:  BP (!) 111/41 (BP Location: Left Arm)   Pulse (!) 55   Temp 99 F (37.2 C) (Axillary)   Resp 18   Ht 5\' 1"  (1.549 m)   Wt 68.7 kg   SpO2 91%   BMI 28.62 kg/m   Physical Exam: Physical Exam Vitals and nursing note reviewed.  Constitutional:      General: She is sleeping. She is not in acute distress. HENT:     Head: Normocephalic and atraumatic.  Cardiovascular:     Rate and Rhythm: Normal rate.  Pulmonary:     Effort: Pulmonary effort is normal. No respiratory distress.  Abdominal:     General: There is distension.  Skin:    General: Skin is warm and dry.     Coloration: Skin is jaundiced.     Palliative Assessment/Data: 10-20%    Existing Vynca/ACP Documentation: None  Assessment & Plan:   Impression: Present on Admission:  Blood loss  anemia  Rheumatoid arthritis (HCC)  Thrombocytopenia (HCC)  Alcohol use disorder  GERD (gastroesophageal reflux disease)  Bipolar 2 disorder (HCC)  Prolonged QT interval  Cardiomegaly  Occult blood positive stool  Tobacco abuse  Encephalopathy acute  67 year old female with comorbidities and acute presentation as described above.  She remains off evaluator but on high flow nasal cannula, mild to minimal improvement in encephalopathy.  She has a [place] place now and is receiving lactulose and Xifaxan.  Levophed is now off but continue with octreotide and Precedex.  Is also receiving tube  feedings.  MELD score with mild improvement today but still significantly elevated.  Family seems understand how sick she is, and understand that all transplant centers contacted have declined transfer for transplant.  We have scheduled a family meeting for tomorrow at 1:00 at the bedside.  We will make further decisions on how to proceed given this development.   SUMMARY OF RECOMMENDATIONS   Continued support of patient and family Family meeting tomorrow at 1:00 at the bedside PMT will continue to follow  Symptom Management:  Per primary team PMT is available to assist as needed  Code Status: Full code  Prognosis: Unable to determine  Discharge Planning: To Be Determined  Discussed with: Medical team, nursing team, [facility] team  Thank you for allowing [place] to participate in the care of Erika [name] PMT will continue to support holistically.  Time Total: 35 min  Detailed review of medical records (labs, imaging, vital signs), medically appropriate exam, discussed with treatment team, counseling and education to patient, family, & staff, documenting clinical information, medication management, coordination of care  Wynne Dust, NP Palliative Medicine Team  Team Phone # [phone] (Nights/Weekends)  06/30/2021, 8:17 AM

## 2023-07-28 NOTE — Progress Notes (Addendum)
NAME:  Erika [name], MRN:  [id number], DOB:  04/29/1956, LOS: 12 ADMISSION DATE:  07/30/2023, CONSULTATION DATE:  07/21/23 REFERRING MD:  Grier Mitts., MD CHIEF COMPLAINT:  Hypotension/AMS   History of Present Illness:  Erika [name] is a 67 y.o. female with a hx of rheumatoid arthritis, cirrhosis secondary to alcohol abuse, bipolar disorder, history of portal hypertension, GERD, HTN, chronic pain on lyrica who was admitted 9/14 for swelling of her left hip after suffering a fall the week before. She was admitted for acute blood loss anemia due to left hip hematoma and heme positive stool.   She developed runs of SVT treated with beta blockers. She developed hypotension and altered mental status with bradycardia which prompted PCCM consult.   Nephrology is following for AKI in setting of hepato renal syndrome. GI has been following for cirrhosis and esophageal varices and portal hypertension. She has been on lactulose and rifaximin for hepatic encephalopathy.  Pertinent  Medical History   Past Medical History:  Diagnosis Date   Arthritis    Cirrhosis (HCC)    Depression    GERD (gastroesophageal reflux disease)    History of shingles 10/18/2018   Hypertension    Rheumatoid arthritis(714.0)    Significant Hospital Events: Including procedures, antibiotic start and stop dates in addition to other pertinent events   9/14 admitted 9/19 Altered mental status, hypotension and bradycardia, transferred to ICU. Unable to obtain fluid for paracentesis 9/21 goals of care discussions >> family want to continue aggressive care 9/23 Cortrak placed, tube feeds started, Lactulose increased 30 mg q4h  9/24 [facility] called for transfer for liver transplant > declined. Off Levophed.  9/25 Nephrology signed off, [facility] declined transfer. Free water added for hypernatremia and lactulose reduced to q8h.   Interim History / Subjective:  Continue to be encephalopathic - opens eyes to  voice not redirectable. BP stable overnight on midodrine 10 mg q8h. MELD-Na improving with renal function.   Called and spoke with son, Erika [name]. He is aware that [facility] and [facility] have declined transfer for transplant. He is going to update family members on her prognosis.   Objective   Blood pressure (!) 127/45, pulse (!) 50, temperature 99 F (37.2 C), temperature source Axillary, resp. rate 13, height 5\' 1"  (1.549 m), weight 68.7 kg, SpO2 100%.    FiO2 (%):  [50 %] 50 %   Intake/Output Summary (Last 24 hours) at 07/28/2023 1159 Last data filed at 07/28/2023 0934 Gross per 24 hour  Intake 1596.46 ml  Output 2450 ml  Net -853.54 ml   Filed Weights   07/22/23 0500 07/26/23 0454 07/28/23 0500  Weight: 69.8 kg 72.5 kg 68.7 kg    Examination:  General: Chronically ill-appearing, jaundiced, encephalopathic, agitated  HENT: Anicteric, dry oral mucosa, No LAD,+ JVD, PERRLA Lungs: Bilateral chest excursion , Few Rhonchi Cardiovascular: S1-S2 , No RMG, SB per tele Abdomen: Soft, bowel sounds appreciated Extremities: Ecchymosis left leg, brisk refill Neuro: encephalopathic, MAE x 4 but not to command  GU: Foley   Labs:  LA: 3.6>3.8> 3.8 Cr 3.52>3.00 >2.59  Resolved Hospital Problem list     Assessment & Plan:   ASSESSMENT / PLAN:  PULMONARY  Pulmonary edema Chronic Respiratory Failure: stable on 20L/50% HFNC. CXR obtained 9/24 showed improvement in pulmonary edema. Secondary to portal hypertension and hepatorenal syndrome. 07/28/2023 -> stable on 20L/50% HFNC overnight.  - continue HFNC, wean as tolerated  - chest PT as tolerated  - monitor fluid status  NEUROLOGIC Hepatic Encephalopathy: Slight improvement. Able to track to voice. Becomes agitated when off Precedex. CT head on 9/21 negative for acute intracranial process.  - continue lactulose q8h and rifaximin - reduce sedation as tolerated   - restarting Lamictal taper to agitation  - soft restraints applied this morning  for patient safety   VASCULAR US Negative for DVT: stable, bilateral LE [place] completed 08-06-23 which was negative for DVT. No unilateral swelling on exam today.  - monitor clinically  - continue SCDs   CARDIAC Grade 2 DD: Stable, TTE completed on 9/15 with LVEF 60-65%, no wall motion abnormalities.  Tricuspid valve regurgitation  - continue cardiac monitoring    INFECTIOUS Septic Shock: Presented to the ICU with hypotension which has since improved. No growth on Bcx 5 days. On Right arm wound cultures - positive for staph aureus which is covered by current therapy. LA elevated yesterday to 3.5. On recheck it resulted 3.8. Febrile to 101.5 yesterday morning.  Lactic Acidosis: Worsening, concerning for LA up-trending without clear source of infection at this time. Procal elevated to 3, up from prior.  Pneumonia R/O: breathing has been stable on HFNC, afebrile.    ABX:  CTX 06-Aug-2023- 9/19) Cefepime (9/19 - 9/22) Ancef (9/23 - ) P:   - Discontinue Cefazolin, afebrile overnight.  - follow blood cultures  - increase midodrine to 5 mg for pressure support.    RENAL Acute kidney injury 2/2 Hepatorenal syndrome: Improving. Unfortunately not a candidate for dialysis due to overall clinical prognosis. Cr down trending. Urine output 1.3 L yesterday, overall NET + 9.1 L this admission.  - nephrology s/o 9/25 - Continue Octreotide - Trend BMET - Maintain MAP >60, SBP >100   ELECTROLYTES Hyperphosphatemia: Improving. Down from 9.0 on 9/19 - daily CMP, Mag, Phos    GASTROINTESTINAL Alcoholic cirrhosis: S/P RUQ [place]: ascites, liver cirrhosis, GB sludge w/ thickening; MELD score 29 - improving with improvement of renal function.  Portal hypertension Esophageal varices: stable, S/P EGD 916 with no overt bleeding  Peptic Ulcer Disease: hgb stable, continues on PPI.  P:   - Cortrak placed, lactulose 30 g q8h  - Rifaximin q12h  - PPI BID x 8 weeks per GI  - start Lasix 40 mg per tube  - followed  by 5 mg metolizone  - give albumin 30 Q4h for 4 doses  - recheck BMP at 1600 - DC free water    HEMATOLOGIC   - HEME Normocytic Anemia: stable, at 8.1. 2/2 to chronic disease and acute GI bleed in earlier admission. Patient is high risk for esophageal bleed with known esophageal varices.  - PRBC for hgb </= 6.9gm%    - exceptions are   -  if ACS susepcted/confirmed then transfuse for hgb </= 8.0gm%,  or    -  active bleeding with hemodynamic instability, then transfuse regardless of hemoglobin value   At at all times try to transfuse 1 unit prbc as possible with exception of active hemorrhage   HEMATOLOGIC - Platelets Thrombocytopenia: Stable, 2/2 to liver failure  - trend CBC  - monitor for signs of bleeding   ENDOCRINE Hyperglycemia: CBG up-trending  - continue moderate SSI - adding Semglee 15 units daily    MSK/DERM Skin intact. Pressure bandages applied to bilateral ankles.    Best Practice (right click and "Reselect all SmartList Selections" daily)   Diet/type: tubefeeds and NPO DVT prophylaxis: SCD GI prophylaxis: PPI Lines: N/A Foley:  Yes, and it is still needed Code  Status:  full code Last date of multidisciplinary goals of care discussion [9/21 with son]  Labs   CBC: Recent Labs  Lab 07/22/23 0252 07/23/23 0308 07/23/23 1220 07/24/23 0341 07/25/23 0432 07/26/23 0706 07/27/23 0340  WBC 12.2* 9.5  --  4.8 5.7 6.9 8.4  NEUTROABS 10.0*  --   --   --   --   --   --   HGB 7.3* 6.3* 7.4* 7.0* 7.2* 7.1* 8.2*  HCT 22.7* 19.6* 21.9* 21.6* 22.2* 22.7* 25.8*  MCV 96.2 98.5  --  95.2 95.7 97.8 100.8*  PLT 51* 51*  --  35* 30* 40* 41*    Basic Metabolic Panel: Recent Labs  Lab 07/24/23 1001 07/24/23 1228 07/24/23 2246 07/26/23 0706 07/26/23 0707 07/27/23 0340 07/28/23 0403 07/28/23 0404  NA 138  --   --   --  143 150* 152* 152*  K 4.4   < > 4.2  --  4.1 4.2 4.3 4.2  CL 103  --   --   --  107 112* 116* 116*  CO2 19*  --   --   --  20* 23 23 24    GLUCOSE 175*  --   --   --  234* 235* 290* 289*  BUN 133*  --   --   --  143* 145* 151* 150*  CREATININE 4.07*  --   --   --  3.52* 3.00* 2.69* 2.59*  CALCIUM 7.5*  --   --   --  8.0* 8.4* 8.7* 8.7*  MG  --   --   --  2.2  --   --  2.5*  --   PHOS 9.3*  --   --  7.4* 7.5* 6.1* 5.0* 5.1*   < > = values in this interval not displayed.   GFR: Estimated Creatinine Clearance: 18.7 mL/min (A) (by C-G formula based on SCr of 2.59 mg/dL (H)). Recent Labs  Lab 07/24/23 0341 07/25/23 0432 07/26/23 0706 07/26/23 1838 07/27/23 0340 07/28/23 0403  PROCALCITON  --   --  3.27  --   --   --   WBC 4.8 5.7 6.9  --  8.4  --   LATICACIDVEN  --   --  3.7* 3.6* 3.8* 3.8*    Liver Function Tests: Recent Labs  Lab 07/22/23 0252 07/23/23 0308 07/24/23 1001 07/26/23 0706 07/26/23 0707 07/27/23 0340 07/28/23 0403 07/28/23 0404  AST 49* 60* 85* 75*  --   --  70*  --   ALT 15 17 22 19   --   --  10  --   ALKPHOS 63 46 49 67  --   --  92  --   BILITOT 7.9* 7.5* 7.3* 4.8*  --   --  3.7*  --   PROT 7.0 6.9 6.2* 6.4*  --   --  6.6  --   ALBUMIN 3.8 4.0 3.1* 2.6* 2.6* 2.8* 2.6* 2.5*   Recent Labs  Lab 07/28/23 0403  LIPASE 63*  AMYLASE 51   Recent Labs  Lab 07/28/23 0403  AMMONIA 44*    ABG    Component Value Date/Time   PHART 7.35 07/21/2023 1726   PCO2ART 44 07/21/2023 1726   PO2ART 90 07/21/2023 1726   HCO3 24.3 07/21/2023 1726   ACIDBASEDEF 1.5 07/21/2023 1726   O2SAT 97.5 07/21/2023 1726     Coagulation Profile: Recent Labs  Lab 07/23/23 0308 07/26/23 0706 07/28/23 0403  INR 2.2* 2.1* 2.2*    Cardiac  Enzymes: Recent Labs  Lab 07/28/23 0403  CKTOTAL 169     HbA1C: Hgb A1c MFr Bld  Date/Time Value Ref Range Status  04/24/2021 12:46 AM 5.6 4.8 - 5.6 % Final    Comment:    (NOTE) Pre diabetes:          5.7%-6.4%  Diabetes:              >6.4%  Glycemic control for   <7.0% adults with diabetes     CBG: Recent Labs  Lab 07/27/23 1935 07/27/23 2359  07/28/23 0337 07/28/23 0735 07/28/23 1147  GLUCAP 243* 277* 264* 247* 233*    Allergies No Known Allergies   Home Medications  Prior to Admission medications   Medication Sig Start Date End Date Taking? Authorizing Provider  FLUoxetine (PROZAC) 20 MG capsule Take 1 capsule (20 mg total) by mouth daily. 07/08/23  Yes Claiborne Rigg, NP  lamoTRIgine (LAMICTAL) 100 MG tablet Take 1 tablet (100 mg total) by mouth 2 (two) times daily. 07/08/23  Yes Claiborne Rigg, NP  pregabalin (LYRICA) 100 MG capsule Take 1 capsule (100 mg total) by mouth 2 (two) times daily. 07/08/23  Yes Claiborne Rigg, NP  traZODone (DESYREL) 100 MG tablet Take 1 tablet (100 mg total) by mouth at bedtime. 07/08/23  Yes Claiborne Rigg, NP  venlafaxine XR (EFFEXOR-XR) 150 MG 24 hr capsule Take 1 capsule (150 mg total) by mouth daily with breakfast. 07/08/23  Yes Claiborne Rigg, NP  ARIPiprazole (ABILIFY) 5 MG tablet Take 5 mg by mouth daily. 07/11/23   [provider]  thiamine (VITAMIN B1) 100 MG tablet Take 1 tablet (100 mg total) by mouth daily. Patient not taking: Reported on 07/10/2023 07/08/23   Claiborne Rigg, NP     Critical Care APP time 35 minutes  Glendale Chard, DO [facility] Pulmonary/Critical Care Medicine See Amion for personal pager PCCM on call pager [phone]  07/28/2023 11:59 AM

## 2023-07-29 ENCOUNTER — Inpatient Hospital Stay: Admission: RE | Admit: 2023-07-29 | Payer: 59 | Source: Ambulatory Visit

## 2023-07-29 DIAGNOSIS — Z515 Encounter for palliative care: Secondary | ICD-10-CM | POA: Diagnosis not present

## 2023-07-29 DIAGNOSIS — N179 Acute kidney failure, unspecified: Secondary | ICD-10-CM | POA: Diagnosis not present

## 2023-07-29 DIAGNOSIS — Z66 Do not resuscitate: Secondary | ICD-10-CM | POA: Diagnosis not present

## 2023-07-29 DIAGNOSIS — D500 Iron deficiency anemia secondary to blood loss (chronic): Secondary | ICD-10-CM | POA: Diagnosis not present

## 2023-07-29 DIAGNOSIS — Z7189 Other specified counseling: Secondary | ICD-10-CM | POA: Diagnosis not present

## 2023-07-29 LAB — PHOSPHORUS: Phosphorus: 5.3 mg/dL — ABNORMAL HIGH (ref 2.5–4.6)

## 2023-07-29 LAB — PROTIME-INR
INR: 2.6 — ABNORMAL HIGH (ref 0.8–1.2)
Prothrombin Time: 28.5 s — ABNORMAL HIGH (ref 11.4–15.2)

## 2023-07-29 LAB — CBC
HCT: 16.6 % — ABNORMAL LOW (ref 36.0–46.0)
Hemoglobin: 4.9 g/dL — CL (ref 12.0–15.0)
MCH: 31.2 pg (ref 26.0–34.0)
MCHC: 29.5 g/dL — ABNORMAL LOW (ref 30.0–36.0)
MCV: 105.7 fL — ABNORMAL HIGH (ref 80.0–100.0)
Platelets: 29 10*3/uL — CL (ref 150–400)
RBC: 1.57 MIL/uL — ABNORMAL LOW (ref 3.87–5.11)
RDW: 21.7 % — ABNORMAL HIGH (ref 11.5–15.5)
WBC: 7.4 10*3/uL (ref 4.0–10.5)
nRBC: 0 % (ref 0.0–0.2)

## 2023-07-29 LAB — COMPREHENSIVE METABOLIC PANEL
ALT: 10 U/L (ref 0–44)
AST: 71 U/L — ABNORMAL HIGH (ref 15–41)
Albumin: 3.8 g/dL (ref 3.5–5.0)
Alkaline Phosphatase: 74 U/L (ref 38–126)
Anion gap: 18 — ABNORMAL HIGH (ref 5–15)
BUN: 179 mg/dL — ABNORMAL HIGH (ref 8–23)
CO2: 20 mmol/L — ABNORMAL LOW (ref 22–32)
Calcium: 9.1 mg/dL (ref 8.9–10.3)
Chloride: 114 mmol/L — ABNORMAL HIGH (ref 98–111)
Creatinine, Ser: 3.13 mg/dL — ABNORMAL HIGH (ref 0.44–1.00)
GFR, Estimated: 16 mL/min — ABNORMAL LOW (ref >60)
Glucose, Bld: 259 mg/dL — ABNORMAL HIGH (ref 70–99)
Potassium: 4.3 mmol/L (ref 3.5–5.1)
Sodium: 152 mmol/L — ABNORMAL HIGH (ref 135–145)
Total Bilirubin: 3.5 mg/dL — ABNORMAL HIGH (ref 0.3–1.2)
Total Protein: 6.8 g/dL (ref 6.5–8.1)

## 2023-07-29 LAB — MAGNESIUM: Magnesium: 2.6 mg/dL — ABNORMAL HIGH (ref 1.7–2.4)

## 2023-07-29 LAB — GLUCOSE, CAPILLARY
Glucose-Capillary: 231 mg/dL — ABNORMAL HIGH (ref 70–99)
Glucose-Capillary: 231 mg/dL — ABNORMAL HIGH (ref 70–99)
Glucose-Capillary: 223 mg/dL — ABNORMAL HIGH (ref 70–99)

## 2023-07-29 LAB — LACTIC ACID, PLASMA: Lactic Acid, Venous: 5.6 mmol/L (ref 0.5–1.9)

## 2023-07-29 MED ORDER — LORAZEPAM 2 MG/ML IJ SOLN
1.0000 mg | INTRAMUSCULAR | Status: DC | PRN
  Administered 2023-07-29: 2 mg via INTRAVENOUS

## 2023-07-29 MED ORDER — AMIODARONE HCL IN DEXTROSE 360-4.14 MG/200ML-% IV SOLN
INTRAVENOUS | Status: AC
  Administered 2023-07-29: 60 mg/h via INTRAVENOUS
  Filled 2023-07-29: qty 200

## 2023-07-29 MED ORDER — ACETAMINOPHEN 650 MG RE SUPP: 650.0000 mg | Freq: Four times a day (QID) | RECTAL | Status: DC | PRN

## 2023-07-29 MED ORDER — GLYCOPYRROLATE 0.2 MG/ML IJ SOLN: 0.2000 mg | INTRAMUSCULAR | Status: DC | PRN

## 2023-07-29 MED ORDER — ONDANSETRON HCL 4 MG/2ML IJ SOLN: 4.0000 mg | Freq: Four times a day (QID) | INTRAMUSCULAR | Status: DC | PRN

## 2023-07-29 MED ORDER — HYDROMORPHONE BOLUS VIA INFUSION: 0.2000 mg | INTRAVENOUS | Status: DC | PRN

## 2023-07-29 MED ORDER — ONDANSETRON 4 MG PO TBDP: 4.0000 mg | ORAL_TABLET | Freq: Four times a day (QID) | ORAL | Status: DC | PRN

## 2023-07-29 MED ORDER — LORAZEPAM 2 MG/ML IJ SOLN
1.0000 mg | INTRAMUSCULAR | Status: DC | PRN
  Administered 2023-07-29: 1 mg via INTRAVENOUS
  Filled 2023-07-29: qty 1

## 2023-07-29 MED ORDER — AMIODARONE HCL IN DEXTROSE 360-4.14 MG/200ML-% IV SOLN: 30.0000 mg/h | INTRAVENOUS | Status: DC

## 2023-07-29 MED ORDER — LORAZEPAM 1 MG PO TABS: 1.0000 mg | ORAL_TABLET | ORAL | Status: DC | PRN

## 2023-07-29 MED ORDER — HALOPERIDOL LACTATE 2 MG/ML PO CONC: 0.5000 mg | ORAL | Status: DC | PRN

## 2023-07-29 MED ORDER — DEXMEDETOMIDINE HCL IN NACL 400 MCG/100ML IV SOLN
0.0000 ug/kg/h | INTRAVENOUS | Status: DC
  Administered 2023-07-29: 1.2 ug/kg/h via INTRAVENOUS
  Administered 2023-07-29: 1 ug/kg/h via INTRAVENOUS
  Filled 2023-07-29: qty 100

## 2023-07-29 MED ORDER — LORAZEPAM 2 MG/ML IJ SOLN
INTRAMUSCULAR | Status: AC
  Filled 2023-07-29: qty 1

## 2023-07-29 MED ORDER — HALOPERIDOL LACTATE 5 MG/ML IJ SOLN: 0.5000 mg | INTRAMUSCULAR | Status: DC | PRN

## 2023-07-29 MED ORDER — LORAZEPAM 2 MG/ML PO CONC: 1.0000 mg | ORAL | Status: DC | PRN

## 2023-07-29 MED ORDER — HALOPERIDOL 1 MG PO TABS: 0.5000 mg | ORAL_TABLET | ORAL | Status: DC | PRN

## 2023-07-29 MED ORDER — POLYVINYL ALCOHOL 1.4 % OP SOLN: 1.0000 [drp] | Freq: Four times a day (QID) | OPHTHALMIC | Status: DC | PRN

## 2023-07-29 MED ORDER — GLYCOPYRROLATE 1 MG PO TABS: 1.0000 mg | ORAL_TABLET | ORAL | Status: DC | PRN

## 2023-07-29 MED ORDER — AMIODARONE LOAD VIA INFUSION
150.0000 mg | Freq: Once | INTRAVENOUS | Status: AC
  Administered 2023-07-29: 150 mg via INTRAVENOUS
  Filled 2023-07-29: qty 83.34

## 2023-07-29 MED ORDER — ACETAMINOPHEN 325 MG PO TABS: 650.0000 mg | ORAL_TABLET | Freq: Four times a day (QID) | ORAL | Status: DC | PRN

## 2023-07-29 MED ORDER — CLONAZEPAM 1 MG PO TABS
2.0000 mg | ORAL_TABLET | Freq: Two times a day (BID) | ORAL | Status: DC
  Administered 2023-07-29: 2 mg
  Filled 2023-07-29: qty 2

## 2023-07-29 MED ORDER — AMIODARONE HCL IN DEXTROSE 360-4.14 MG/200ML-% IV SOLN: 60.0000 mg/h | INTRAVENOUS | Status: DC

## 2023-07-29 MED ORDER — HYDROMORPHONE HCL-NACL 50-0.9 MG/50ML-% IV SOLN
0.5000 mg/h | INTRAVENOUS | Status: DC
  Filled 2023-07-29: qty 50

## 2023-07-29 MED ORDER — BIOTENE DRY MOUTH MT LIQD: 15.0000 mL | OROMUCOSAL | Status: DC | PRN

## 2023-07-29 MED ORDER — HYDROMORPHONE HCL-NACL 50-0.9 MG/50ML-% IV SOLN
0.5000 mg/h | INTRAVENOUS | Status: DC
  Administered 2023-07-29: 0.5 mg/h via INTRAVENOUS

## 2023-07-29 MED ORDER — LORAZEPAM 2 MG/ML IJ SOLN: 1.0000 mg | INTRAMUSCULAR | Status: DC | PRN

## 2023-08-02 NOTE — Progress Notes (Addendum)
NAME:  Erika [name], MRN:  [id number], DOB:  [birth year], LOS: 13 ADMISSION DATE:  07/03/2023, CONSULTATION DATE:  07/21/23 REFERRING MD:  Grier Mitts., MD CHIEF COMPLAINT:  Hypotension/AMS   History of Present Illness:  Erika [name] is a 67 y.o. female with a hx of rheumatoid arthritis, cirrhosis secondary to alcohol abuse, bipolar disorder, history of portal hypertension, GERD, HTN, chronic pain on lyrica who was admitted 9/14 for swelling of her left hip after suffering a fall the week before. She was admitted for acute blood loss anemia due to left hip hematoma and heme positive stool.   She developed runs of SVT treated with beta blockers. She developed hypotension and altered mental status with bradycardia which prompted PCCM consult.   Nephrology is following for AKI in setting of hepato renal syndrome. GI has been following for cirrhosis and esophageal varices and portal hypertension. She has been on lactulose and rifaximin for hepatic encephalopathy.  Pertinent  Medical History   Past Medical History:  Diagnosis Date   Arthritis    Cirrhosis (HCC)    Depression    GERD (gastroesophageal reflux disease)    History of shingles 10/18/2018   Hypertension    Rheumatoid arthritis(714.0)    Significant Hospital Events: Including procedures, antibiotic start and stop dates in addition to other pertinent events   9/14 admitted 9/19 Altered mental status, hypotension and bradycardia, transferred to ICU. Unable to obtain fluid for paracentesis 9/21 goals of care discussions >> family want to continue aggressive care 9/23 Cortrak placed, tube feeds started, Lactulose increased 30 mg q4h  9/24 [facility] called for transfer for liver transplant > declined. Off Levophed.  9/25 Nephrology signed off, [facility] declined transfer. Free water added for hypernatremia and lactulose reduced to q8h.  9/26 stopped free water, started Lasix/metolazone and albumin for diuresis.    Interim History / Subjective:  GOC meeting with palliative scheduled for 1 PM today.  Weaned to 20L/38% HFNC yesterday  Continues to be encephalopathic   Addendum:  Weaned off Precedex. Patient became tachycardic and agitated. EKG showed a fib with RVR. Restarted Precedex. See rest of plan under CARDIAC.   Objective   Blood pressure (!) 110/56, pulse (!) 47, temperature (!) 97.5 F (36.4 C), temperature source Axillary, resp. rate 12, height 5\' 1"  (1.549 m), weight 68.7 kg, SpO2 95%.    FiO2 (%):  [38 %-40 %] 40 %   Intake/Output Summary (Last 24 hours) at 15-Aug-2023 1135 Last data filed at 08-15-23 4098 Gross per 24 hour  Intake 2051.52 ml  Output 1110 ml  Net 941.52 ml   Filed Weights   07/22/23 0500 07/26/23 0454 07/28/23 0500  Weight: 69.8 kg 72.5 kg 68.7 kg    Examination:  General: Chronically ill-appearing, jaundiced, encephalopathic, agitated  HENT: Anicteric, dry oral mucosa, No LAD,+ JVD, PERRLA Lungs: Bilateral chest excursion , Few Rhonchi Cardiovascular: S1-S2 , No RMG, SB per tele Abdomen: Soft, bowel sounds appreciated Extremities: Ecchymosis left leg, brisk refill Neuro: encephalopathic, MAE x 4 but not to command  GU: Foley   AM labs pending   Resolved Hospital Problem list     Assessment & Plan:   ASSESSMENT / PLAN:  PULMONARY  Pulmonary edema Chronic Respiratory Failure: Improving. CXR obtained 9/24 showed improvement in pulmonary edema. Secondary to portal hypertension and hepatorenal syndrome. At baseline did not require O2.  August 15, 2023 -> weaned to 20L/38% HFNC overnight.  - continue HFNC, wean as tolerated  - chest PT as tolerated  -  monitor fluid status  -s/p Lasix 40 mg and metolazone 9/26   NEUROLOGIC Hepatic Encephalopathy: Stable. Able to track to voice. Becomes agitated when off Precedex. CT head on 9/21 negative for acute intracranial process.  - continue lactulose q8h and rifaximin - reduce sedation as tolerated   -  restarting Lamictal taper for agitation  - soft restraints continued for patient safety - DC Precedex - Ativan PRN for agitation    VASCULAR US Negative for DVT: stable, bilateral LE [place] completed 9/14 which was negative for DVT. No unilateral swelling on exam today.  - monitor clinically  - continue SCDs   CARDIAC Grade 2 DD: Stable, TTE completed on 9/15 with LVEF 60-65%, no wall motion abnormalities.  Tachycardia/A fib RVR  - EKG with a fib with RVR - start amiodarone bolus followed by infusion  - monitor electrolytes when resulted.    INFECTIOUS Septic Shock: Presented to the ICU with hypotension which has since improved. No growth on Bcx 5 days. On Right arm wound cultures - positive for staph aureus which is covered by current therapy. LA elevated yesterday to 3.5. On recheck it resulted 3.8. Febrile to 101.5 yesterday morning.  Lactic Acidosis: Worsening, concerning for LA up-trending without clear source of infection at this time. Procal elevated to 3, up from prior.  Pneumonia R/O: breathing has been stable on HFNC, afebrile.    ABX:  CTX (9/14- 9/19) Cefepime (9/19 - 9/22) Ancef (9/23 - 9/26) P:   - Bcx: NG 5 days  - increased midodrine to 5 mg for pressure support w/ improvement of pressures    RENAL Acute kidney injury 2/2 Hepatorenal syndrome: Improving. Unfortunately not a candidate for dialysis due to overall clinical prognosis. Cr down trending. Urine output 650 cc yesterday, overall NET + 9.3 L this admission.  - nephrology s/o 9/25 - Continue Octreotide - Trend BMET - Maintain MAP >60, SBP >100   ELECTROLYTES Hyperphosphatemia: Stable - daily CMP, Mag, Phos  -f/u AM labs   GASTROINTESTINAL Alcoholic cirrhosis: S/P RUQ [place]: ascites, liver cirrhosis, GB sludge w/ thickening; MELD score 29 - improving with improvement of renal function.  Portal hypertension Esophageal varices: stable, S/P EGD 916 with no overt bleeding  Peptic Ulcer Disease: hgb  stable, continues on PPI.  P:   - Cortrak placed, lactulose 30 g q8h  - Rifaximin q12h  - PPI BID x 8 weeks per GI  - s/p Lasix 40 mg and 5 mg metolizone  - s/p albumin 30 Q4h for 4 doses    HEMATOLOGIC   - HEME Normocytic Anemia: stable, at 8.1. 2/2 to chronic disease and acute GI bleed in earlier admission. Patient is high risk for esophageal bleed with known esophageal varices.  - PRBC for hgb </= 6.9gm%    - exceptions are   -  if ACS susepcted/confirmed then transfuse for hgb </= 8.0gm%,  or    -  active bleeding with hemodynamic instability, then transfuse regardless of hemoglobin value   At at all times try to transfuse 1 unit prbc as possible with exception of active hemorrhage   HEMATOLOGIC - Platelets Thrombocytopenia: Stable, 2/2 to liver failure  - trend CBC  - monitor for signs of bleeding    ENDOCRINE Hyperglycemia: CBG up-trending, A1c 5.2 - continue moderate SSI - continue Semglee 15 units daily    MSK/DERM Skin intact. Pressure bandages applied to bilateral ankles.    Best Practice (right click and "Reselect all SmartList Selections" daily)   Diet/type: tubefeeds  and NPO DVT prophylaxis: SCD GI prophylaxis: PPI Lines: N/A Foley:  Yes, and it is still needed Code Status:  full code Last date of multidisciplinary goals of care discussion [9/26 with son]  Labs   CBC: Recent Labs  Lab 07/23/23 0308 07/23/23 1220 07/24/23 0341 07/25/23 0432 07/26/23 0706 07/27/23 0340  WBC 9.5  --  4.8 5.7 6.9 8.4  HGB 6.3* 7.4* 7.0* 7.2* 7.1* 8.2*  HCT 19.6* 21.9* 21.6* 22.2* 22.7* 25.8*  MCV 98.5  --  95.2 95.7 97.8 100.8*  PLT 51*  --  35* 30* 40* 41*    Basic Metabolic Panel: Recent Labs  Lab 07/26/23 0706 07/26/23 0707 07/27/23 0340 07/28/23 0403 07/28/23 0404 07/28/23 2223  NA  --  143 150* 152* 152* 150*  K  --  4.1 4.2 4.3 4.2 4.0  CL  --  107 112* 116* 116* 115*  CO2  --  20* 23 23 24  21*  GLUCOSE  --  234* 235* 290* 289* 286*  BUN  --   143* 145* 151* 150* 165*  CREATININE  --  3.52* 3.00* 2.69* 2.59* 2.61*  CALCIUM  --  8.0* 8.4* 8.7* 8.7* 8.7*  MG 2.2  --   --  2.5*  --  2.3  PHOS 7.4* 7.5* 6.1* 5.0* 5.1*  --    GFR: Estimated Creatinine Clearance: 18.6 mL/min (A) (by C-G formula based on SCr of 2.61 mg/dL (H)). Recent Labs  Lab 07/24/23 0341 07/25/23 0432 07/26/23 0706 07/26/23 1838 07/27/23 0340 07/28/23 0403  PROCALCITON  --   --  3.27  --   --   --   WBC 4.8 5.7 6.9  --  8.4  --   LATICACIDVEN  --   --  3.7* 3.6* 3.8* 3.8*    Liver Function Tests: Recent Labs  Lab 07/23/23 0308 07/24/23 1001 07/26/23 0706 07/26/23 0707 07/27/23 0340 07/28/23 0403 07/28/23 0404 07/28/23 2223  AST 60* 85* 75*  --   --  70*  --  71*  ALT 17 22 19   --   --  10  --  12  ALKPHOS 46 49 67  --   --  92  --  85  BILITOT 7.5* 7.3* 4.8*  --   --  3.7*  --  3.2*  PROT 6.9 6.2* 6.4*  --   --  6.6  --  6.3*  ALBUMIN 4.0 3.1* 2.6* 2.6* 2.8* 2.6* 2.5* 2.7*   Recent Labs  Lab 07/28/23 0403  LIPASE 63*  AMYLASE 51   Recent Labs  Lab 07/28/23 0403  AMMONIA 44*    ABG    Component Value Date/Time   PHART 7.35 07/21/2023 1726   PCO2ART 44 07/21/2023 1726   PO2ART 90 07/21/2023 1726   HCO3 24.3 07/21/2023 1726   ACIDBASEDEF 1.5 07/21/2023 1726   O2SAT 97.5 07/21/2023 1726     Coagulation Profile: Recent Labs  Lab 07/23/23 0308 07/26/23 0706 07/28/23 0403  INR 2.2* 2.1* 2.2*    Cardiac Enzymes: Recent Labs  Lab 07/28/23 0403  CKTOTAL 169     HbA1C: Hgb A1c MFr Bld  Date/Time Value Ref Range Status  07/28/2023 10:23 PM 5.2 4.8 - 5.6 % Final    Comment:    (NOTE) Pre diabetes:          5.7%-6.4%  Diabetes:              >6.4%  Glycemic control for   <7.0% adults with diabetes  04/24/2021 12:46 AM 5.6 4.8 - 5.6 % Final    Comment:    (NOTE) Pre diabetes:          5.7%-6.4%  Diabetes:              >6.4%  Glycemic control for   <7.0% adults with diabetes     CBG: Recent Labs  Lab  07/28/23 1524 07/28/23 1940 07/28/23 2327 August 06, 2023 0413 Aug 06, 2023 0726  GLUCAP 246* 258* 235* 231* 231*    Allergies No Known Allergies   Home Medications  Prior to Admission medications   Medication Sig Start Date End Date Taking? Authorizing Provider  FLUoxetine (PROZAC) 20 MG capsule Take 1 capsule (20 mg total) by mouth daily. 07/08/23  Yes Claiborne Rigg, NP  lamoTRIgine (LAMICTAL) 100 MG tablet Take 1 tablet (100 mg total) by mouth 2 (two) times daily. 07/08/23  Yes Claiborne Rigg, NP  pregabalin (LYRICA) 100 MG capsule Take 1 capsule (100 mg total) by mouth 2 (two) times daily. 07/08/23  Yes Claiborne Rigg, NP  traZODone (DESYREL) 100 MG tablet Take 1 tablet (100 mg total) by mouth at bedtime. 07/08/23  Yes Claiborne Rigg, NP  venlafaxine XR (EFFEXOR-XR) 150 MG 24 hr capsule Take 1 capsule (150 mg total) by mouth daily with breakfast. 07/08/23  Yes Claiborne Rigg, NP  ARIPiprazole (ABILIFY) 5 MG tablet Take 5 mg by mouth daily. 07/11/23   [provider]  thiamine (VITAMIN B1) 100 MG tablet Take 1 tablet (100 mg total) by mouth daily. Patient not taking: Reported on 07/08/2023 07/08/23   Claiborne Rigg, NP     Critical Care APP time 35 minutes  Glendale Chard, DO [facility] Pulmonary/Critical Care Medicine See Amion for personal pager PCCM on call pager [phone]  08/06/2023 11:35 AM

## 2023-08-02 NOTE — Progress Notes (Signed)
Patient has arrived to the unit via bed from . Family is at bedside.

## 2023-08-02 NOTE — Inpatient Diabetes Management (Signed)
Inpatient Diabetes Program Recommendations  AACE/ADA: New Consensus Statement on Inpatient Glycemic Control (2015)  Target Ranges:  Prepandial:   less than 140 mg/dL      Peak postprandial:   less than 180 mg/dL (1-2 hours)      Critically ill patients:  140 - 180 mg/dL   Lab Results  Component Value Date   GLUCAP 223 (H) 07/07/2023   HGBA1C 5.2 07/28/2023    Latest Reference Range & Units 07/28/23 15:24 07/28/23 19:40 07/28/23 23:27 07/07/2023 04:13 07/07/2023 07:26 07/22/2023 11:32  Glucose-Capillary 70 - 99 mg/dL 161 (H) 096 (H) 045 (H) 231 (H) 231 (H) 223 (H)  (H): Data is abnormally high Review of Glycemic Control  Diabetes history: none Outpatient Diabetes medications: none Current orders for Inpatient glycemic control: Semglee 15 units daily, Novolog 0-15 units correction scale every 4 hours.  Inpatient Diabetes Program Recommendations:   Noted that blood sugars have been greater than 180 mg/dl.   Recommend adding Novolog 3 units every 4 hours as tube feed coverage if blood sugars continue to be elevated.  Smith Mince RN BSN CDE Diabetes Coordinator Pager: [phone]  8am-5pm

## 2023-08-02 NOTE — Progress Notes (Signed)
Notified by patients family that "patient was not breathing anymore." Assessed patient with nurse (Deepa), no pulse or heart sounds x 1 min. Time of death 17:51. Provider Francine Graven) notified. [employer] notified, patient potential tissue and eye donor. Post mortem checklist started. After family leaves bedside Post mortem care will be performed and checklist will be complete.

## 2023-08-02 NOTE — Progress Notes (Signed)
Hydromorphone (Dilaudid) 50 mg in 50 ml NS (1 mg/ml) premix infusion pulled from previous unit cabinet override. Unable to waste the remaninig dose. Wasted 20 mg/ml in steri bin with Autumn RN.

## 2023-08-02 NOTE — Progress Notes (Signed)
Daily Progress Note   Patient Name: Erika [name]       Date: 08/01/2023 DOB: May 09, 1956  Age: 67 y.o. MRN#: [id number] Attending Physician: Martina Sinner, MD Primary Care Physician: Claiborne Rigg, NP Admit Date: 08-05-23 Length of Stay: 13 days  Reason for Consultation/Follow-up: Establishing goals of care  HPI/Patient Profile:  67 y.o. female  with past medical history of rheumatoid arthritis, cirrhosis secondary to alcohol abuse, bipolar disorder, history of portal hypertension, GERD, HTN, chronic pain on lyrica admitted on 08-05-2023 with left hip swelling post fall one week prior. She was admitted for acute blood loss anemia due to left hip hematoma and heme positive stool.    She has since developed altered mental status, hypotension, and bradycardia and is in the ICU.  Subjective:   Subjective: Chart Reviewed. Updates received. Patient Assessed. Created space and opportunity for patient  and family to explore thoughts and feelings regarding current medical situation.  Today's Discussion: Today came to the unit for the planned family meeting with the patient's son at 1:00.  I arrived approximately 1240.  When I went to see the patient at the bedside it was noted that her heart rate was down into the 30s, sometimes as high as 42.  She appeared to be actively dying with guppy breathing.  She has 20 L high flow nasal cannula at 50% oxygen.  Sats were 95%.  Per nursing staff when the nasal cannula occasionally comes off as the patient resistant her sats will rapidly drop into the 70s.  Given her's profound bradycardia and high oxygen need I called the patient's son and discussed the current situation while he is en route to the hospital.  We discussed transitioning her to DNR status given the possibility of her cardiac arrest.  I explained that cardiac arrest would be detrimental to her and CPR almost assuredly would not save her and would only provide discomfort and lack of  dignity at the end of life.  He agreed and so we transitioned her to DNR-limited status.  Around 115 the patient's son arrived along with a friend and his daughter.  We excused ourselves to the conference room and had a substantial discussion about her current clinical situation, the fact that there is no option for transplant and transplant is the only way to fix her situation.  At this point there is no cure and she is approaching end-of-life.  I explained that she appears to be actively dying at this time.  We discussed comfort care.  With comfort care with the patient would no longer receive aggressive medical interventions such as continuous vital signs, lab work, radiology testing, or medications not focused on comfort. All care would focus on how the patient is looking and feeling. This would include management of any symptoms that may cause discomfort, pain, shortness of breath, cough, nausea, agitation, anxiety, and/or secretions etc. Symptoms would be managed with medications and other non-pharmacological interventions such as spiritual support if requested, repositioning, music therapy, or therapeutic listening. Family verbalized understanding and appreciation.  The patient's son agreed with transition to comfort care.  I informed him that while the patient is on comfort care that palliative medicine will continue to follow and manage her symptoms.  I explained specifics about medications that would be used in her situation to help provide peace, comfort, and dignity at the end of life.  I explained that unfortunately we are unable to transfer her to home or to even residential hospice for  end-of-life as she is not stable enough for this at this time.  He verbalized understanding.  I provided emotional and general support through therapeutic listening, empathy, sharing of stories, and other techniques. I answered all questions and addressed all concerns to the best of my ability.   Review of  Systems  Unable to perform ROS: Acuity of condition    Objective:   Vital Signs:  BP 104/70 (BP Location: Left Leg)   Pulse (!) 138   Temp (!) 97.5 F (36.4 C) (Axillary)   Resp 20   Ht 5\' 1"  (1.549 m)   Wt 68.7 kg   SpO2 99%   BMI 28.62 kg/m   Physical Exam: Physical Exam Vitals and nursing note reviewed.  Constitutional:      General: She is sleeping. She is not in acute distress.    Appearance: She is ill-appearing and toxic-appearing.  HENT:     Head: Normocephalic and atraumatic.  Cardiovascular:     Rate and Rhythm: Bradycardia present.  Pulmonary:     Effort: No respiratory distress.     Comments: Irregular respirations and apparent "guppy breathing" evidence of likely actively dying.  Noted high flow nasal cannula in place. Abdominal:     General: Abdomen is flat. There is distension.  Skin:    General: Skin is warm and dry.     Coloration: Skin is jaundiced.  Neurological:     Mental Status: She is disoriented.     Palliative Assessment/Data: 10%    Existing Vynca/ACP Documentation: None  Assessment & Plan:   Impression: Present on Admission:  Blood loss anemia  Rheumatoid arthritis (HCC)  Thrombocytopenia (HCC)  Alcohol use disorder  GERD (gastroesophageal reflux disease)  Bipolar 2 disorder (HCC)  Prolonged QT interval  Cardiomegaly  Occult blood positive stool  Tobacco abuse  Encephalopathy acute  67 year old female with comorbidities and acute presentation as described above.  She remains off pressors but on high flow nasal cannula, noted profound encephalopathy despite Xifaxan and lactulose.  She has a Panda tube in place now and is receiving lactulose and Xifaxan.  Levophed is now off but continue with octreotide and Precedex.  Is also receiving tube feedings.  MELD score with mild improvement today but still significantly elevated.  Family seems understand how sick she is, and understands that all transplant centers contacted have  declined transfer for transplant.  After discussion today they understand that she is irreparably approaching end-of-life.  Given this they have made the decision to transition to DNR and comfort care.  We offered support and condolences on this difficult situation.   SUMMARY OF RECOMMENDATIONS   Continued support of patient and family Symptom management orders as per below Titrate oxygen to room air Stop tube feedings May discontinue panda tube after tube feeding stopped unless otherwise needed PMT will continue to follow daily while on comfort care  Symptom Management:  Tylenol 650 mg PR every 6 hours as needed pain or fever Biotene solution 15 mL topical as needed for dry mouth Robinul 0.2 mg IV every 4 hours as needed excessive secretions Haldol 0.5 mg IV every 4 hours as needed agitation or delirium Dilaudid infusion 0.5 to 5 mg/h titrate for pain, respiratory distress Dilaudid bolus via infusion 0.2 mg IV every 30 minutes as needed uncontrolled pain or respiratory distress Ativan 1 to 2 mg IV every 4 hours as needed anxiety or fear Zofran 4 mg IV every 6 hours as needed nausea or vomiting Polyvinyl alcohol 1.4% ophthalmic  1 drop OU 4 times daily as needed dry eyes  Code Status: DNR-Comfort  Prognosis: Hours - Days  Discharge Planning: Anticipated Hospital Death  Discussed with: Medical team, nursing team, [facility] team  Thank you for allowing [place] to participate in the care of Erika [name] PMT will continue to support holistically.  Time Total: 90 min  Detailed review of medical records (labs, imaging, vital signs), medically appropriate exam, discussed with treatment team, counseling and education to patient, family, & staff, documenting clinical information, medication management, coordination of care  Wynne Dust, NP Palliative Medicine Team  Team Phone # [phone] (Nights/Weekends)  06/30/2021, 8:17 AM

## 2023-08-02 NOTE — Progress Notes (Signed)
HYDROmorphone (DILAUDID) 50 mg in 50 mL NS (1mg /mL) premix infusion pulled from previous unit cabinet override. Unable to waste remaining dose in pyxis. Remaining dose wasted with Jolyn Lent, RN . Wasted 20 mL in steri bin.

## 2023-08-02 NOTE — Progress Notes (Signed)
07/07/2023 1800  Spiritual Encounters  Type of Visit Initial  Care provided to: [facility] partners present during encounter Nurse  Referral source Nurse (RN/NT/LPN)  Reason for visit Patient death  OnCall Visit Yes   Ch responded to request for emotional and spiritual support. Pt's family was at bedside. Ch provided compassionate and non-anxious presence. Ch remains available if needed.

## 2023-08-02 NOTE — Progress Notes (Signed)
Heated high flow nasal cannula removed at this time as pt is on comfort measures. RT will continue to be available as needed.

## 2023-08-02 DEATH — deceased

## 2023-09-02 NOTE — Death Summary Note (Signed)
DEATH SUMMARY   Patient Details  Name: Erika [name] MRN: [id number] DOB: October 15, 1956  Admission/Discharge Information   Admit Date:  01-Aug-2023  Date of Death: Date of Death: 08-14-2023  Time of Death: Time of Death: 1750-10-06  Length of Stay: 09/30/2023  Referring Physician: Claiborne Rigg, NP   Reason(s) for Hospitalization  Left lower extremity bruising and swelling after a fal Altered Mentation  Diagnoses  Preliminary cause of death:  Hepatic Cirrhosis  Secondary Diagnoses (including complications and co-morbidities):  Principal Problem:   Blood loss anemia Active Problems:   Rheumatoid arthritis (HCC)   Alcohol use disorder   Thrombocytopenia (HCC)   GERD (gastroesophageal reflux disease)   Tobacco abuse   Bipolar 2 disorder (HCC)   Prolonged QT interval   Cardiomegaly   Occult blood positive stool   Encephalopathy acute   Gastric ulcer with hemorrhage   Duodenal ulcer disease   AKI (acute kidney injury) (HCC)   Secondary esophageal varices without bleeding (HCC)   Alcoholic cirrhosis of liver with ascites (HCC)   Acute respiratory failure with hypoxia [facility])   Brief Hospital Course (including significant findings, care, treatment, and services provided and events leading to death)  Erika [name] is a 67 y.o. year old female  with a hx of rheumatoid arthritis, cirrhosis secondary to alcohol abuse, bipolar disorder, history of portal hypertension, GERD, HTN, chronic pain on lyrica who was admitted 01-Aug-2023 for swelling of her left hip after suffering a fall the week before. She was admitted for acute blood loss anemia due to left hip hematoma and heme positive stool.    She developed runs of SVT treated with beta blockers. She developed hypotension and altered mental status with bradycardia which prompted PCCM consult.    Nephrology is following for AKI in setting of hepato renal syndrome. GI has been following for cirrhosis and esophageal varices and portal hypertension.  She has been on lactulose and rifaximin for hepatic encephalopathy.  She required precedex drip for agitation which worked well. Despite aggressive lactulose therapy her mentation never improved. Calls were made to multiple transplant centers and patient deemed not a candidate for liver transplant.  She had progressive worsening of her renal function and inability to diurese. Discussions held with family that she would not benefit from CRRT as she is not a long term dialysis candidate. Palliative care was consulted and family decided to transition to comfort care. Patient passed away August 14, 2023 at 1751.  Pertinent Labs and Studies  Significant Diagnostic Studies No results found.  Microbiology No results found for this or any previous visit (from the past 240 hour(s)).  Lab Basic Metabolic Panel: No results for input(s): "NA", "K", "CL", "CO2", "GLUCOSE", "BUN", "CREATININE", "CALCIUM", "MG", "PHOS" in the last 168 hours. Liver Function Tests: No results for input(s): "AST", "ALT", "ALKPHOS", "BILITOT", "PROT", "ALBUMIN" in the last 168 hours. No results for input(s): "LIPASE", "AMYLASE" in the last 168 hours. No results for input(s): "AMMONIA" in the last 168 hours. CBC: No results for input(s): "WBC", "NEUTROABS", "HGB", "HCT", "MCV", "PLT" in the last 168 hours. Cardiac Enzymes: No results for input(s): "CKTOTAL", "CKMB", "CKMBINDEX", "TROPONINI" in the last 168 hours. Sepsis Labs: No results for input(s): "PROCALCITON", "WBC", "LATICACIDVEN" in the last 168 hours.  Procedures/Operations     Martina Sinner 08/29/2023, 10:23 AM

## 2023-09-09 ENCOUNTER — Ambulatory Visit: Payer: 59 | Admitting: Gastroenterology

## 2023-09-23 ENCOUNTER — Encounter: Payer: 59 | Admitting: Internal Medicine

## 2023-10-04 ENCOUNTER — Other Ambulatory Visit: Payer: Self-pay | Admitting: Nurse Practitioner

## 2023-10-04 DIAGNOSIS — F3181 Bipolar II disorder: Secondary | ICD-10-CM

## 2023-10-07 ENCOUNTER — Ambulatory Visit: Payer: 59 | Admitting: Nurse Practitioner
